# Patient Record
Sex: Male | Born: 1950 | Race: White | Hispanic: No | Smoking: Former smoker
Health system: Southern US, Community
[De-identification: ages and names within clinical notes are randomized; demographics above are authoritative.]

## PROBLEM LIST (undated history)

## (undated) DIAGNOSIS — C4491 Basal cell carcinoma of skin, unspecified: Secondary | ICD-10-CM

## (undated) DIAGNOSIS — I10 Essential (primary) hypertension: Secondary | ICD-10-CM

## (undated) DIAGNOSIS — R51 Headache: Secondary | ICD-10-CM

## (undated) DIAGNOSIS — R9439 Abnormal result of other cardiovascular function study: Secondary | ICD-10-CM

## (undated) DIAGNOSIS — J439 Emphysema, unspecified: Secondary | ICD-10-CM

## (undated) DIAGNOSIS — C4442 Squamous cell carcinoma of skin of scalp and neck: Secondary | ICD-10-CM

## (undated) DIAGNOSIS — L03119 Cellulitis of unspecified part of limb: Secondary | ICD-10-CM

## (undated) DIAGNOSIS — Z87891 Personal history of nicotine dependence: Secondary | ICD-10-CM

## (undated) DIAGNOSIS — E119 Type 2 diabetes mellitus without complications: Secondary | ICD-10-CM

## (undated) DIAGNOSIS — I2510 Atherosclerotic heart disease of native coronary artery without angina pectoris: Secondary | ICD-10-CM

## (undated) DIAGNOSIS — K4090 Unilateral inguinal hernia, without obstruction or gangrene, not specified as recurrent: Secondary | ICD-10-CM

## (undated) DIAGNOSIS — M1990 Unspecified osteoarthritis, unspecified site: Secondary | ICD-10-CM

## (undated) DIAGNOSIS — L02519 Cutaneous abscess of unspecified hand: Secondary | ICD-10-CM

## (undated) DIAGNOSIS — K219 Gastro-esophageal reflux disease without esophagitis: Secondary | ICD-10-CM

## (undated) DIAGNOSIS — H539 Unspecified visual disturbance: Secondary | ICD-10-CM

## (undated) DIAGNOSIS — E785 Hyperlipidemia, unspecified: Secondary | ICD-10-CM

## (undated) DIAGNOSIS — R519 Headache, unspecified: Secondary | ICD-10-CM

## (undated) DIAGNOSIS — I639 Cerebral infarction, unspecified: Secondary | ICD-10-CM

## (undated) DIAGNOSIS — M109 Gout, unspecified: Secondary | ICD-10-CM

## (undated) HISTORY — PX: CATARACT EXTRACTION: SUR2

## (undated) HISTORY — PX: TONSILLECTOMY: SUR1361

## (undated) HISTORY — DX: Cerebral infarction, unspecified: I63.9

## (undated) HISTORY — DX: Basal cell carcinoma of skin, unspecified: C44.91

## (undated) HISTORY — PX: MOHS SURGERY: SHX181

## (undated) HISTORY — PX: SKIN CANCER EXCISION: SHX779

## (undated) HISTORY — DX: Unspecified osteoarthritis, unspecified site: M19.90

## (undated) HISTORY — DX: Abnormal result of other cardiovascular function study: R94.39

## (undated) HISTORY — DX: Personal history of nicotine dependence: Z87.891

## (undated) HISTORY — DX: Unspecified visual disturbance: H53.9

---

## 2015-11-30 DIAGNOSIS — H7291 Unspecified perforation of tympanic membrane, right ear: Secondary | ICD-10-CM | POA: Diagnosis not present

## 2015-11-30 DIAGNOSIS — J320 Chronic maxillary sinusitis: Secondary | ICD-10-CM | POA: Diagnosis not present

## 2015-11-30 DIAGNOSIS — H9011 Conductive hearing loss, unilateral, right ear, with unrestricted hearing on the contralateral side: Secondary | ICD-10-CM | POA: Diagnosis not present

## 2015-12-13 DIAGNOSIS — D239 Other benign neoplasm of skin, unspecified: Secondary | ICD-10-CM | POA: Diagnosis not present

## 2015-12-13 DIAGNOSIS — L821 Other seborrheic keratosis: Secondary | ICD-10-CM | POA: Diagnosis not present

## 2016-01-02 DIAGNOSIS — I10 Essential (primary) hypertension: Secondary | ICD-10-CM | POA: Diagnosis not present

## 2016-01-02 DIAGNOSIS — N529 Male erectile dysfunction, unspecified: Secondary | ICD-10-CM | POA: Diagnosis not present

## 2016-01-02 DIAGNOSIS — Z23 Encounter for immunization: Secondary | ICD-10-CM | POA: Diagnosis not present

## 2016-01-02 DIAGNOSIS — R7303 Prediabetes: Secondary | ICD-10-CM | POA: Diagnosis not present

## 2016-01-30 DIAGNOSIS — M25531 Pain in right wrist: Secondary | ICD-10-CM | POA: Diagnosis not present

## 2016-01-30 DIAGNOSIS — M25532 Pain in left wrist: Secondary | ICD-10-CM | POA: Diagnosis not present

## 2016-02-02 DIAGNOSIS — D2339 Other benign neoplasm of skin of other parts of face: Secondary | ICD-10-CM | POA: Diagnosis not present

## 2016-02-02 DIAGNOSIS — M1A09X0 Idiopathic chronic gout, multiple sites, without tophus (tophi): Secondary | ICD-10-CM | POA: Diagnosis not present

## 2016-02-02 DIAGNOSIS — L820 Inflamed seborrheic keratosis: Secondary | ICD-10-CM | POA: Diagnosis not present

## 2016-02-02 DIAGNOSIS — D239 Other benign neoplasm of skin, unspecified: Secondary | ICD-10-CM | POA: Diagnosis not present

## 2016-02-02 DIAGNOSIS — L821 Other seborrheic keratosis: Secondary | ICD-10-CM | POA: Diagnosis not present

## 2016-03-29 ENCOUNTER — Inpatient Hospital Stay (HOSPITAL_COMMUNITY)
Admission: EM | Admit: 2016-03-29 | Discharge: 2016-03-30 | DRG: 066 | Disposition: A | Discharge status: Home / Self Care | Payer: PPO | Attending: Nephrology | Admitting: Nephrology

## 2016-03-29 ENCOUNTER — Emergency Department (HOSPITAL_COMMUNITY): Payer: PPO

## 2016-03-29 ENCOUNTER — Inpatient Hospital Stay (HOSPITAL_COMMUNITY): Payer: PPO

## 2016-03-29 ENCOUNTER — Encounter (HOSPITAL_COMMUNITY): Payer: Self-pay | Admitting: Emergency Medicine

## 2016-03-29 DIAGNOSIS — Z720 Tobacco use: Secondary | ICD-10-CM

## 2016-03-29 DIAGNOSIS — R29704 NIHSS score 4: Secondary | ICD-10-CM | POA: Diagnosis present

## 2016-03-29 DIAGNOSIS — I10 Essential (primary) hypertension: Secondary | ICD-10-CM | POA: Diagnosis present

## 2016-03-29 DIAGNOSIS — I639 Cerebral infarction, unspecified: Principal | ICD-10-CM | POA: Diagnosis present

## 2016-03-29 DIAGNOSIS — R471 Dysarthria and anarthria: Secondary | ICD-10-CM | POA: Diagnosis not present

## 2016-03-29 DIAGNOSIS — R7303 Prediabetes: Secondary | ICD-10-CM | POA: Diagnosis not present

## 2016-03-29 DIAGNOSIS — Z7982 Long term (current) use of aspirin: Secondary | ICD-10-CM | POA: Diagnosis not present

## 2016-03-29 DIAGNOSIS — R208 Other disturbances of skin sensation: Secondary | ICD-10-CM | POA: Diagnosis present

## 2016-03-29 DIAGNOSIS — Z79899 Other long term (current) drug therapy: Secondary | ICD-10-CM

## 2016-03-29 DIAGNOSIS — I6523 Occlusion and stenosis of bilateral carotid arteries: Secondary | ICD-10-CM | POA: Diagnosis not present

## 2016-03-29 DIAGNOSIS — R29810 Facial weakness: Secondary | ICD-10-CM | POA: Diagnosis not present

## 2016-03-29 DIAGNOSIS — R270 Ataxia, unspecified: Secondary | ICD-10-CM | POA: Diagnosis not present

## 2016-03-29 DIAGNOSIS — I6350 Cerebral infarction due to unspecified occlusion or stenosis of unspecified cerebral artery: Secondary | ICD-10-CM | POA: Diagnosis not present

## 2016-03-29 DIAGNOSIS — F17210 Nicotine dependence, cigarettes, uncomplicated: Secondary | ICD-10-CM | POA: Diagnosis present

## 2016-03-29 DIAGNOSIS — Z87891 Personal history of nicotine dependence: Secondary | ICD-10-CM | POA: Insufficient documentation

## 2016-03-29 HISTORY — DX: Cerebral infarction, unspecified: I63.9

## 2016-03-29 LAB — DIFFERENTIAL
Basophils Absolute: 0 10*3/uL (ref 0.0–0.1)
Basophils Relative: 0 %
Eosinophils Absolute: 0.2 10*3/uL (ref 0.0–0.7)
Eosinophils Relative: 2 %
Lymphocytes Relative: 20 %
Lymphs Abs: 1.8 10*3/uL (ref 0.7–4.0)
Monocytes Absolute: 0.5 10*3/uL (ref 0.1–1.0)
Monocytes Relative: 6 %
Neutro Abs: 6.6 10*3/uL (ref 1.7–7.7)
Neutrophils Relative %: 72 %

## 2016-03-29 LAB — I-STAT CHEM 8, ED
BUN: 20 mg/dL (ref 6–20)
Calcium, Ion: 1.12 mmol/L — ABNORMAL LOW (ref 1.15–1.40)
Chloride: 99 mmol/L — ABNORMAL LOW (ref 101–111)
Creatinine, Ser: 1.1 mg/dL (ref 0.61–1.24)
Glucose, Bld: 177 mg/dL — ABNORMAL HIGH (ref 65–99)
HCT: 49 % (ref 39.0–52.0)
Hemoglobin: 16.7 g/dL (ref 13.0–17.0)
Potassium: 3.9 mmol/L (ref 3.5–5.1)
Sodium: 137 mmol/L (ref 135–145)
TCO2: 27 mmol/L (ref 0–100)

## 2016-03-29 LAB — URINALYSIS, ROUTINE W REFLEX MICROSCOPIC
Bilirubin Urine: NEGATIVE
Glucose, UA: NEGATIVE mg/dL
Hgb urine dipstick: NEGATIVE
Ketones, ur: NEGATIVE mg/dL
Leukocytes, UA: NEGATIVE
Nitrite: NEGATIVE
Protein, ur: NEGATIVE mg/dL
Specific Gravity, Urine: 1.004 — ABNORMAL LOW (ref 1.005–1.030)
pH: 6 (ref 5.0–8.0)

## 2016-03-29 LAB — CBC
HCT: 47.4 % (ref 39.0–52.0)
Hemoglobin: 16.2 g/dL (ref 13.0–17.0)
MCH: 30.1 pg (ref 26.0–34.0)
MCHC: 34.2 g/dL (ref 30.0–36.0)
MCV: 88.1 fL (ref 78.0–100.0)
Platelets: 168 10*3/uL (ref 150–400)
RBC: 5.38 MIL/uL (ref 4.22–5.81)
RDW: 13.5 % (ref 11.5–15.5)
WBC: 9.1 10*3/uL (ref 4.0–10.5)

## 2016-03-29 LAB — RAPID URINE DRUG SCREEN, HOSP PERFORMED
Amphetamines: NOT DETECTED
Barbiturates: NOT DETECTED
Benzodiazepines: NOT DETECTED
Cocaine: NOT DETECTED
Opiates: NOT DETECTED
Tetrahydrocannabinol: NOT DETECTED

## 2016-03-29 LAB — COMPREHENSIVE METABOLIC PANEL
ALT: 23 U/L (ref 17–63)
AST: 20 U/L (ref 15–41)
Albumin: 4.3 g/dL (ref 3.5–5.0)
Alkaline Phosphatase: 83 U/L (ref 38–126)
Anion gap: 8 (ref 5–15)
BUN: 18 mg/dL (ref 6–20)
CO2: 27 mmol/L (ref 22–32)
Calcium: 9.3 mg/dL (ref 8.9–10.3)
Chloride: 100 mmol/L — ABNORMAL LOW (ref 101–111)
Creatinine, Ser: 1.13 mg/dL (ref 0.61–1.24)
GFR calc Af Amer: >60 mL/min (ref >60)
GFR calc non Af Amer: >60 mL/min (ref >60)
Glucose, Bld: 182 mg/dL — ABNORMAL HIGH (ref 65–99)
Potassium: 3.9 mmol/L (ref 3.5–5.1)
Sodium: 135 mmol/L (ref 135–145)
Total Bilirubin: 0.6 mg/dL (ref 0.3–1.2)
Total Protein: 7.5 g/dL (ref 6.5–8.1)

## 2016-03-29 LAB — I-STAT TROPONIN, ED: Troponin i, poc: 0 ng/mL (ref 0.00–0.08)

## 2016-03-29 LAB — CBG MONITORING, ED: Glucose-Capillary: 164 mg/dL — ABNORMAL HIGH (ref 65–99)

## 2016-03-29 LAB — APTT: aPTT: 29 seconds (ref 24–36)

## 2016-03-29 LAB — PROTIME-INR
INR: 0.98
Prothrombin Time: 13 seconds (ref 11.4–15.2)

## 2016-03-29 MED ORDER — ACETAMINOPHEN 650 MG RE SUPP: 650.0000 mg | RECTAL | Status: DC | PRN

## 2016-03-29 MED ORDER — ALLOPURINOL 100 MG PO TABS
100.0000 mg | ORAL_TABLET | Freq: Every day | ORAL | Status: DC
  Administered 2016-03-30: 100 mg via ORAL
  Filled 2016-03-29: qty 1

## 2016-03-29 MED ORDER — NICOTINE 21 MG/24HR TD PT24
21.0000 mg | MEDICATED_PATCH | Freq: Every day | TRANSDERMAL | Status: DC
  Administered 2016-03-30: 21 mg via TRANSDERMAL
  Filled 2016-03-29: qty 1

## 2016-03-29 MED ORDER — STROKE: EARLY STAGES OF RECOVERY BOOK
Freq: Once | Status: AC
  Administered 2016-03-30: 10:00:00
  Filled 2016-03-29 (×2): qty 1

## 2016-03-29 MED ORDER — ENOXAPARIN SODIUM 40 MG/0.4ML ~~LOC~~ SOLN
40.0000 mg | SUBCUTANEOUS | Status: DC
  Administered 2016-03-29: 40 mg via SUBCUTANEOUS
  Filled 2016-03-29: qty 0.4

## 2016-03-29 MED ORDER — ACETAMINOPHEN 325 MG PO TABS: 650.0000 mg | ORAL_TABLET | ORAL | Status: DC | PRN

## 2016-03-29 MED ORDER — NICOTINE 21 MG/24HR TD PT24
21.0000 mg | MEDICATED_PATCH | Freq: Once | TRANSDERMAL | Status: AC
  Administered 2016-03-29: 21 mg via TRANSDERMAL
  Filled 2016-03-29: qty 1

## 2016-03-29 MED ORDER — VALACYCLOVIR HCL 500 MG PO TABS
500.0000 mg | ORAL_TABLET | Freq: Once | ORAL | Status: AC
  Administered 2016-03-29: 500 mg via ORAL
  Filled 2016-03-29: qty 1

## 2016-03-29 MED ORDER — SENNOSIDES-DOCUSATE SODIUM 8.6-50 MG PO TABS: 1.0000 | ORAL_TABLET | Freq: Every evening | ORAL | Status: DC | PRN

## 2016-03-29 MED ORDER — ASPIRIN 325 MG PO TABS
325.0000 mg | ORAL_TABLET | Freq: Every day | ORAL | Status: DC
  Administered 2016-03-29 – 2016-03-30 (×2): 325 mg via ORAL
  Filled 2016-03-29 (×2): qty 1

## 2016-03-29 MED ORDER — NICOTINE 21 MG/24HR TD PT24
MEDICATED_PATCH | TRANSDERMAL | Status: AC
  Filled 2016-03-29: qty 1

## 2016-03-29 MED ORDER — SODIUM CHLORIDE 0.9 % IV SOLN
INTRAVENOUS | Status: DC
  Administered 2016-03-29 – 2016-03-30 (×2): via INTRAVENOUS

## 2016-03-29 MED ORDER — PREDNISONE 50 MG PO TABS
60.0000 mg | ORAL_TABLET | Freq: Once | ORAL | Status: AC
  Administered 2016-03-29: 60 mg via ORAL
  Filled 2016-03-29: qty 1

## 2016-03-29 MED ORDER — ACETAMINOPHEN 160 MG/5ML PO SOLN: 650.0000 mg | ORAL | Status: DC | PRN

## 2016-03-29 NOTE — ED Notes (Signed)
Patient ambulatory to restroom with steady gait, clean catch instructions given and advised pt to bring specimen back to room as well.  

## 2016-03-29 NOTE — Consult Note (Signed)
Drew A. Merlene Laughter, MD     [email or web address]          Drew Harrison is an 66 y.o. male.   ASSESSMENT/PLAN: 1. 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 Risk factors hypertension, age and nicotine use. The patient aspirin will be increased from 81 mg at 325 mg. Smoking cessation is discussed with the patient. Lipid panel is pending and so is echocardiography. I believe it is reasonable to start the patient on low dose statin even if his cholesterol profile is normal. The patient is a follow-up in the office with me in one month.    The patient is a 66 year old white male who presents with the acute onset of being somewhat unsteady and left facial weakness 3 AM yesterday. He woke up with the symptoms. He also reports that his speech has been slurred. He reports having some dysesthesia involving the left upper extremity but this lasted only momentarily and has since resolved. The patient denies headaches, persistent focal numbness or weakness. He denies diplopia or dysphagia. There is no chest pain, shortness of breath, GI GU symptoms. The patient tells me he has been on 81 mg aspirin for a few years. He also has a history of hypertension and has been compliant with antihypertensive medications. He has had these cholesterol checked about 3 months ago and this apparently was fine. He was not placed on vacation for this. The review systems otherwise negative.  GENERAL: This is a thin pleasant man in no acute distress.  HEENT: This is unremarkable. He is status post cataract extraction procedure.  ABDOMEN: soft  EXTREMITIES: No edema   BACK: Normal  SKIN: Normal by inspection.    MENTAL STATUS: Alert and oriented including orientation to age and month. Speech - mildly dysarthric, language and cognition are generally intact. Judgment and insight normal.   CRANIAL NERVES: Pupils are equal, round and reactive to light and accomodation; extra ocular  movements are full, there is no significant nystagmus; visual fields are full; the frontalis muscles are normal bilaterally, the left obicularis oculi is slightly weak but the orbicularis oris muscles and lower facial muscles are markedly weak. The right facial muscles are normal. The tongue is midline; uvula is midline; shoulder elevation is normal.  MOTOR: Normal tone, bulk and strength; no pronator drift. There is no drift of the legs or upper extremities.  COORDINATION: Left finger to nose is normal, right finger to nose is normal, No rest tremor; no intention tremor; no postural tremor; no bradykinesia.  REFLEXES: Deep tendon reflexes are symmetrical and normal. Babinski reflexes are flexor bilaterally.   SENSATION: Normal to light touch, temperature, and pinprick. The patient does not extinguish to double simultaneous visual or tactile stimulation.  GAIT: Normal.    NIH stroke scale 3.  Blood pressure (!) 164/89, pulse (!) 109, temperature 98.1 F (36.7 C), temperature source Oral, resp. rate 18, height 6' (1.829 m), weight 190 lb (86.2 kg), SpO2 97 %.  History reviewed. No pertinent past medical history.  History reviewed. No pertinent surgical history.  History reviewed. No pertinent family history.  Social History:  reports that he has been smoking Cigarettes.  He has quit using smokeless tobacco. He reports that he does not drink alcohol. His drug history is not on file.  Allergies: No Known Allergies  Medications: Prior to Admission medications   Medication Sig Start Date End Date Taking? Authorizing Provider  allopurinol (ZYLOPRIM) 100 MG tablet Take 1 tablet by mouth daily. 02/06/16  Yes Historical Provider, MD  aspirin 325 MG tablet Take 325 mg by mouth daily.   Yes Historical Provider, MD  lisinopril-hydrochlorothiazide (PRINZIDE,ZESTORETIC) 20-25 MG tablet Take 1 tablet by mouth daily. 01/12/16  Yes Historical Provider, MD  naproxen sodium (ANAPROX) 220 MG tablet  Take 220 mg by mouth daily as needed (pain).   Yes Historical Provider, MD    Scheduled Meds: .  stroke: mapping our early stages of recovery book   Does not apply Once  . [START ON 03/30/2016] allopurinol  100 mg Oral Daily  . aspirin  325 mg Oral Daily  . enoxaparin (LOVENOX) injection  40 mg Subcutaneous Q24H  . nicotine      . nicotine  21 mg Transdermal Once  . [START ON 03/30/2016] nicotine  21 mg Transdermal Daily   Continuous Infusions: . sodium chloride     PRN Meds:.acetaminophen **OR** acetaminophen (TYLENOL) oral liquid 160 mg/5 mL **OR** acetaminophen, senna-docusate     Results for orders placed or performed during the hospital encounter of 03/29/16 (from the past 48 hour(s))  Protime-INR     Status: None   Collection Time: 03/29/16  8:59 AM  Result Value Ref Range   Prothrombin Time 13.0 11.4 - 15.2 seconds   INR 0.98   APTT     Status: None   Collection Time: 03/29/16  8:59 AM  Result Value Ref Range   aPTT 29 24 - 36 seconds  CBC     Status: None   Collection Time: 03/29/16  8:59 AM  Result Value Ref Range   WBC 9.1 4.0 - 10.5 K/uL   RBC 5.38 4.22 - 5.81 MIL/uL   Hemoglobin 16.2 13.0 - 17.0 g/dL   HCT 47.4 39.0 - 52.0 %   MCV 88.1 78.0 - 100.0 fL   MCH 30.1 26.0 - 34.0 pg   MCHC 34.2 30.0 - 36.0 g/dL   RDW 13.5 11.5 - 15.5 %   Platelets 168 150 - 400 K/uL  Differential     Status: None   Collection Time: 03/29/16  8:59 AM  Result Value Ref Range   Neutrophils Relative % 72 %   Neutro Abs 6.6 1.7 - 7.7 K/uL   Lymphocytes Relative 20 %   Lymphs Abs 1.8 0.7 - 4.0 K/uL   Monocytes Relative 6 %   Monocytes Absolute 0.5 0.1 - 1.0 K/uL   Eosinophils Relative 2 %   Eosinophils Absolute 0.2 0.0 - 0.7 K/uL   Basophils Relative 0 %   Basophils Absolute 0.0 0.0 - 0.1 K/uL  Comprehensive metabolic panel     Status: Abnormal   Collection Time: 03/29/16  8:59 AM  Result Value Ref Range   Sodium 135 135 - 145 mmol/L   Potassium 3.9 3.5 - 5.1 mmol/L   Chloride  100 (L) 101 - 111 mmol/L   CO2 27 22 - 32 mmol/L   Glucose, Bld 182 (H) 65 - 99 mg/dL   BUN 18 6 - 20 mg/dL   Creatinine, Ser 1.13 0.61 - 1.24 mg/dL   Calcium 9.3 8.9 - 10.3 mg/dL   Total Protein 7.5 6.5 - 8.1 g/dL   Albumin 4.3 3.5 - 5.0 g/dL   AST 20 15 - 41 U/L   ALT 23 17 - 63 U/L   Alkaline Phosphatase 83 38 - 126 U/L   Total Bilirubin 0.6 0.3 - 1.2 mg/dL   GFR calc non Af Amer >60 >60 mL/min   GFR calc Af Amer >60 >60 mL/min  Comment: (NOTE) The eGFR has been calculated using the CKD EPI equation. This calculation has not been validated in all clinical situations. eGFR's persistently <60 mL/min signify possible Chronic Kidney Disease.    Anion gap 8 5 - 15  Urine rapid drug screen (hosp performed)not at [facility]     Status: None   Collection Time: 03/29/16  9:03 AM  Result Value Ref Range   Opiates NONE DETECTED NONE DETECTED   Cocaine NONE DETECTED NONE DETECTED   Benzodiazepines NONE DETECTED NONE DETECTED   Amphetamines NONE DETECTED NONE DETECTED   Tetrahydrocannabinol NONE DETECTED NONE DETECTED   Barbiturates NONE DETECTED NONE DETECTED    Comment:        DRUG SCREEN FOR MEDICAL PURPOSES ONLY.  IF CONFIRMATION IS NEEDED FOR ANY PURPOSE, NOTIFY LAB WITHIN 5 DAYS.        LOWEST DETECTABLE LIMITS FOR URINE DRUG SCREEN Drug Class       Cutoff (ng/mL) Amphetamine      1000 Barbiturate      200 Benzodiazepine   540 Tricyclics       981 Opiates          300 Cocaine          300 THC              50   Urinalysis, Routine w reflex microscopic     Status: Abnormal   Collection Time: 03/29/16  9:03 AM  Result Value Ref Range   Color, Urine STRAW (A) YELLOW   APPearance CLEAR CLEAR   Specific Gravity, Urine 1.004 (L) 1.005 - 1.030   pH 6.0 5.0 - 8.0   Glucose, UA NEGATIVE NEGATIVE mg/dL   Hgb urine dipstick NEGATIVE NEGATIVE   Bilirubin Urine NEGATIVE NEGATIVE   Ketones, ur NEGATIVE NEGATIVE mg/dL   Protein, ur NEGATIVE NEGATIVE mg/dL   Nitrite NEGATIVE  NEGATIVE   Leukocytes, UA NEGATIVE NEGATIVE  I-stat troponin, ED     Status: None   Collection Time: 03/29/16  9:10 AM  Result Value Ref Range   Troponin i, poc 0.00 0.00 - 0.08 ng/mL   Comment 3            Comment: Due to the release kinetics of cTnI, a negative result within the first hours of the onset of symptoms does not rule out myocardial infarction with certainty. If myocardial infarction is still suspected, repeat the test at appropriate intervals.   I-Stat Chem 8, ED     Status: Abnormal   Collection Time: 03/29/16  9:12 AM  Result Value Ref Range   Sodium 137 135 - 145 mmol/L   Potassium 3.9 3.5 - 5.1 mmol/L   Chloride 99 (L) 101 - 111 mmol/L   BUN 20 6 - 20 mg/dL   Creatinine, Ser 1.10 0.61 - 1.24 mg/dL   Glucose, Bld 177 (H) 65 - 99 mg/dL   Calcium, Ion 1.12 (L) 1.15 - 1.40 mmol/L   TCO2 27 0 - 100 mmol/L   Hemoglobin 16.7 13.0 - 17.0 g/dL   HCT 49.0 39.0 - 52.0 %  CBG monitoring, ED     Status: Abnormal   Collection Time: 03/29/16  9:14 AM  Result Value Ref Range   Glucose-Capillary 164 (H) 65 - 99 mg/dL    Studies/Results:   MRI BRAIN FINDINGS: Brain: There is a small 5-6 mm focus of restricted diffusion along the left lower cerebellopontine angle at the level of the pontomedullary junction (series 3, image 71 and series 5, image  50). No associated hemorrhage or mass effect.  No other restricted diffusion. No midline shift, mass effect, evidence of mass lesion, ventriculomegaly, extra-axial collection or acute intracranial hemorrhage. Cervicomedullary junction and pituitary are within normal limits.  Outside of the acute findings largely normal for age gray and white matter signal throughout the brain. There is evidence of a small subcortical white matter chronic microhemorrhage in the superior left occipital lobe on series 10, image 13.  Vascular: Major intracranial vascular flow voids are preserved with generalized intracranial artery  dolichoectasia. This includes distal left vertebral artery ectasia with mild mass effect on the left cerebellopontine angle (series 7, image 6).  Skull and upper cervical spine: Negative. Visualized bone marrow signal is within normal limits.  Sinuses/Orbits: Postoperative changes to both globes. Otherwise negative orbit soft tissues. Mild paranasal sinus mucosal thickening.  Other: Visible internal auditory structures appear normal. Trace right mastoid fluid, likely postinflammatory and significance doubtful. The left mastoids are clear. Negative nasopharynx. Normal stylomastoid foramina. Negative scalp soft tissues.  IMPRESSION: 1. Acute lacunar type infarct in the left lower brainstem at the pontomedullary junction (series 3, image 71), probably affecting the left facial nerve motor nucleus in this clinical setting. No associated hemorrhage or mass effect. 2. Generalized intracranial artery dolichoectasia. 3. Otherwise largely unremarkable for age noncontrast MRI appearance of the brain.      BRAIN MRA FINDINGS: ANTERIOR CIRCULATION:  Distal cervical segments of the internal carotid arteries are tortuous but widely patent with antegrade flow. Petrous, cavernous, and supraclinoid segments widely patent. A1 segments widely patent. Anterior communicating artery normal. Anterior cerebral arteries widely patent to their distal aspects. M1 segments patent without stenosis or occlusion. MCA bifurcations normal. Distal MCA branches well opacified and symmetric.  POSTERIOR CIRCULATION:  Vertebral arteries are largely code dominant and widely patent to the vertebrobasilar junction. Posterior inferior cerebral arteries patent proximally. Mild atheromatous irregularity within the basilar artery without flow-limiting stenosis. Superior cerebral arteries patent bilaterally. Both of the posterior cerebral artery supplied mainly via the basilar artery and are widely patent  to their distal aspects.  No aneurysm or vascular malformation.  IMPRESSION: 1. Mild atheromatous irregularity within the basilar artery without flow-limiting stenosis. 2. Otherwise normal intracranial MRA.         The brain MRI is reviewed in person and shows a tiny increased signal on diffusion imaging involving the pontomedullary junction on the left side. No evidence of other chronic infarcts small or cortical. No other lesions are seen on FLAIR. No hemorrhages are appreciated.         CAROTID DOPPLERS Right:  Heterogeneous and partially calcified plaque at the carotid bifurcation, with discordant results regarding degree of stenosis by established duplex criteria. Peak velocity suggests 50- 69% stenosis, with the ICA/ CCA ratio suggesting a lesser degree of stenosis. If establishing a more accurate degree of stenosis is required, cerebral angiogram should be considered, or as a second best test, CTA.  SYS VELOCITY 171   ICA/CCA 1.6  Left:  Color duplex indicates moderate heterogeneous and calcified plaque, with no hemodynamically significant stenosis by duplex criteria in the left extracranial cerebrovascular circulation.    Jadan Hinojos A. Merlene Harrison, M.D.  Diplomate, Tax adviser of Psychiatry and Neurology ( Neurology). 03/29/2016, 6:21 PM

## 2016-03-29 NOTE — ED Notes (Signed)
Taken to ultrasound by ultrasound staff

## 2016-03-29 NOTE — ED Notes (Signed)
Called CT pt has been marked ready for transport.

## 2016-03-29 NOTE — ED Notes (Signed)
Given peanut butter crackers and water.

## 2016-03-29 NOTE — H&P (Addendum)
History and Physical    Drew Harrison [id number] DOB: 27-Aug-1950 DOA: 03/29/2016  PCP: [place]  Patient coming from: Home  Chief Complaint: Left facial droop  HPI: Drew Harrison is a 66 y.o. male with medical history significant of hypertension, borderline diabetes as per patient, actively smoker presented with left facial droop is associated with slurred speech and thinking sensation on left side since last night. Patient reported that it started around 3 AM in the morning. He was concerned that striking the hospital for evaluation. He smokes about one half pack of cigarettes every day for a long time. He works as a Secondary school teacher. Reported dizziness this morning. Denied fever, chills, headache, nausea, vomiting, chest pain, shortness of breath, abdominal pain. No upper or lower extremity weakness.  ED Course: In the ER, MRI showed acute stroke. Neurology was consulted and patient is admitted for further evaluation. -Initially patient was treated with prednisone, Valcyte for possible Bell's palsy. Letter MRI showed acute stroke.  Review of Systems: As per HPI otherwise 10 point review of systems negative.    Past medical history: Hypertension, borderline diabetes  Past surgical history: No pertinent surgical history.  Social history: reports that he has been smoking Cigarettes.   He reports that he does not drink alcohol. His drug history is not on file.  No Known Allergies allergies: Reviewed with the patient. No known allergies  Family history: Reviewed. No significant family history.  Prior to Admission medications   Medication Sig Start Date End Date Taking? Authorizing Provider  allopurinol (ZYLOPRIM) 100 MG tablet Take 1 tablet by mouth daily. 02/06/16  Yes Historical Provider, MD  aspirin 325 MG tablet Take 325 mg by mouth daily.   Yes Historical Provider, MD  lisinopril-hydrochlorothiazide (PRINZIDE,ZESTORETIC) 20-25 MG tablet Take 1 tablet  by mouth daily. 01/12/16  Yes Historical Provider, MD  naproxen sodium (ANAPROX) 220 MG tablet Take 220 mg by mouth daily as needed (pain).   Yes Historical Provider, MD    Physical Exam: Vitals:   03/29/16 1015 03/29/16 1030 03/29/16 1100 03/29/16 1200  BP:  148/79 163/89 169/95  Pulse:  96 90 94  Resp:  11 17 17   Temp: 98.2 F (36.8 C)     TempSrc:      SpO2:  95% 98% 99%  Weight:      Height:          Constitutional: NAD, calm, comfortable Vitals:   03/29/16 1015 03/29/16 1030 03/29/16 1100 03/29/16 1200  BP:  148/79 163/89 169/95  Pulse:  96 90 94  Resp:  11 17 17   Temp: 98.2 F (36.8 C)     TempSrc:      SpO2:  95% 98% 99%  Weight:      Height:       Eyes: PERRL, lids and conjunctivae normal ENMT: Left-sided fascial droop, mild slurred speech. Neck: normal Respiratory: clear to auscultation bilaterally, no wheezing, no crackles. Normal respiratory effort. No accessory muscle use.  Cardiovascular: Regular rate and rhythm, no murmurs / rubs / gallops. No extremity edema. 2+ pedal pulses. No carotid bruits.  Abdomen: no tenderness, no masses palpated. No hepatosplenomegaly. Bowel sounds positive.  Musculoskeletal: no clubbing / cyanosis. No joint deformity upper and lower extremities. Good ROM, no contractures. Normal muscle tone.  Skin: no rashes, lesions, ulcers. No induration Neurologic: Alert, awake, oriented 3. Left-sided facial droop. Muscle strength 5 over 5 in all extremities, symmetric.  Psychiatric: Normal judgment and insight. Alert and oriented x 3.  Normal mood.    Labs on Admission: I have personally reviewed following labs and imaging studies  CBC:  Recent Labs Lab 03/29/16 0859 03/29/16 0912  WBC 9.1  --   NEUTROABS 6.6  --   HGB 16.2 16.7  HCT 47.4 49.0  MCV 88.1  --   PLT 168  --    Basic Metabolic Panel:  Recent Labs Lab 03/29/16 0859 03/29/16 0912  NA 135 137  K 3.9 3.9  CL 100* 99*  CO2 27  --   GLUCOSE 182* 177*  BUN 18 20   CREATININE 1.13 1.10  CALCIUM 9.3  --    GFR: Estimated Creatinine Clearance: 73.5 mL/min (by C-G formula based on SCr of 1.1 mg/dL). Liver Function Tests:  Recent Labs Lab 03/29/16 0859  AST 20  ALT 23  ALKPHOS 83  BILITOT 0.6  PROT 7.5  ALBUMIN 4.3   No results for input(s): LIPASE, AMYLASE in the last 168 hours. No results for input(s): AMMONIA in the last 168 hours. Coagulation Profile:  Recent Labs Lab 03/29/16 0859  INR 0.98   Cardiac Enzymes: No results for input(s): CKTOTAL, CKMB, CKMBINDEX, TROPONINI in the last 168 hours. BNP (last 3 results) No results for input(s): PROBNP in the last 8760 hours. HbA1C: No results for input(s): HGBA1C in the last 72 hours. CBG:  Recent Labs Lab 03/29/16 0914  GLUCAP 164*   Lipid Profile: No results for input(s): CHOL, HDL, LDLCALC, TRIG, CHOLHDL, LDLDIRECT in the last 72 hours. Thyroid Function Tests: No results for input(s): TSH, T4TOTAL, FREET4, T3FREE, THYROIDAB in the last 72 hours. Anemia Panel: No results for input(s): VITAMINB12, FOLATE, FERRITIN, TIBC, IRON, RETICCTPCT in the last 72 hours. Urine analysis:    Component Value Date/Time   COLORURINE STRAW (A) 03/29/2016 0903   APPEARANCEUR CLEAR 03/29/2016 0903   LABSPEC 1.004 (L) 03/29/2016 0903   PHURINE 6.0 03/29/2016 0903   GLUCOSEU NEGATIVE 03/29/2016 0903   HGBUR NEGATIVE 03/29/2016 0903   BILIRUBINUR NEGATIVE 03/29/2016 0903   KETONESUR NEGATIVE 03/29/2016 0903   PROTEINUR NEGATIVE 03/29/2016 0903   NITRITE NEGATIVE 03/29/2016 0903   LEUKOCYTESUR NEGATIVE 03/29/2016 0903   Sepsis Labs: !!!!!!!!!!!!!!!!!!!!!!!!!!!!!!!!!!!!!!!!!!!! @LABRCNTIP (procalcitonin:4,lacticidven:4) )No results found for this or any previous visit (from the past 240 hour(s)).   Radiological Exams on Admission: Ct Head Wo Contrast  Result Date: 03/29/2016 CLINICAL DATA:  Left-sided facial droop starting at 4 a.m. today. EXAM: CT HEAD WITHOUT CONTRAST TECHNIQUE: Contiguous  axial images were obtained from the base of the skull through the vertex without intravenous contrast. COMPARISON:  None. FINDINGS: Brain: No evidence of acute infarction, hemorrhage, hydrocephalus, extra-axial collection or mass lesion/mass effect. Vascular: Diffuse atherosclerotic calcification. Skull: No acute or aggressive finding. 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 Sinuses/Orbits: Bilateral cataract resection. IMPRESSION: No acute finding. Electronically Signed   By: Monte Fantasia M.D.   On: 03/29/2016 09:59   Mr Brain Wo Contrast  Result Date: 03/29/2016 CLINICAL DATA:  66 year old male with left facial droop since last night. Slurred speech and left side tingling. Recent cold symptoms. Initial encounter. EXAM: MRI HEAD WITHOUT CONTRAST TECHNIQUE: Multiplanar, multiecho pulse sequences of the brain and surrounding structures were obtained without intravenous contrast. COMPARISON:  Head CT without contrast 0947 hours today. FINDINGS: Brain: There is a small 5-6 mm focus of restricted diffusion along the left lower cerebellopontine angle at the level of the pontomedullary junction (series 3, image 71 and series 5, image 50). No associated hemorrhage or mass effect. No other  restricted diffusion. No midline shift, mass effect, evidence of mass lesion, ventriculomegaly, extra-axial collection or acute intracranial hemorrhage. Cervicomedullary junction and pituitary are within normal limits. Outside of the acute findings largely normal for age gray and white matter signal throughout the brain. There is evidence of a small subcortical white matter chronic microhemorrhage in the superior left occipital lobe on series 10, image 13. Vascular: Major intracranial vascular flow voids are preserved with generalized intracranial artery dolichoectasia. This includes distal left vertebral artery ectasia with mild mass effect on the left cerebellopontine angle (series 7, image 6). Skull and upper cervical  spine: Negative. Visualized bone marrow signal is within normal limits. Sinuses/Orbits: Postoperative changes to both globes. Otherwise negative orbit soft tissues. Mild paranasal sinus mucosal thickening. Other: Visible internal auditory structures appear normal. Trace right mastoid fluid, likely postinflammatory and significance doubtful. The left mastoids are clear. Negative nasopharynx. Normal stylomastoid foramina. Negative scalp soft tissues. IMPRESSION: 1. Acute lacunar type infarct in the left lower brainstem at the pontomedullary junction (series 3, image 71), probably affecting the left facial nerve motor nucleus in this clinical setting. No associated hemorrhage or mass effect. 2. Generalized intracranial artery dolichoectasia. 3. Otherwise largely unremarkable for age noncontrast MRI appearance of the brain. Electronically Signed   By: Genevie Ann M.D.   On: 03/29/2016 14:27    EKG: Independently reviewed. Sinus tachycardia.  Assessment/Plan Active Problems:   CVA (cerebral vascular accident) ([place])   Ischemic stroke ([place])  #Acute lacunar infarct in the left lower brainstem at pontomedullary junction: Probably affecting left facial nerve motor nucleus contributing left-sided facial droop, thinking sensation and slurred speech. -Patient takes aspirin at home. We'll continue. Check lipid panel, may need a statin treatment depending on the lipid test result. -Order MRA, carotid Doppler ultrasound, echocardiogram -Hold antihypertensive. Allow permissive hypertension. -Neurology was contacted by ER. Follow up there to completion. -PT, OT evaluation and treatment -Nothing by mouth until a speech and swallow evaluated.  -Neuro check, frequent vitals.   #Hypertension: Hold antihypertensive medication. Allow permissive hypertension. Monitor blood pressure. Admitted in telemetry.  #Tobacco dependence: Education provided to the patient. Nicotine patch ordered.  DVT prophylaxis: Lovenox  subcutaneous Code Status: Full code Family Communication: No family present at bedside Disposition Plan: Likely discharge home in 1-2 days Consults called: Consulted neurologist by ER Admission status: Inpatient because of acute neurological findings and stroke.   Dron Tanna Furry MD [facility] Pager [phone]  If 7PM-7AM, please contact night-coverage [email or web address] Password [email or web address]  03/29/2016, 4:02 PM

## 2016-03-29 NOTE — ED Notes (Signed)
EDP and Patient aware MR will be down until 2pm

## 2016-03-29 NOTE — ED Notes (Signed)
Ambulated to restroom independently  

## 2016-03-29 NOTE — ED Notes (Signed)
Given lunch tray.

## 2016-03-29 NOTE — ED Provider Notes (Signed)
[facility] DEPT Provider Note   CSN: [id number] Arrival date & time: 03/29/16  0846   By signing my name below, I, Hilbert Odor, attest that this documentation has been prepared under the direction and in the presence of Merrily Pew, MD. Electronically Signed: Hilbert Odor, Scribe. 03/29/16. 8:57 AM. History   Chief Complaint Chief Complaint  Patient presents with  . Facial Droop    The history is provided by the patient. No language interpreter was used.   HPI Comments: Drew Harrison is a 66 y.o. male who presents to the Emergency Department complaining of left sided facial droop since last night. Patient also reports associated slurred speech and left sided tingling. Patient states that he has had a cold over the past 2 days. He reports a cough and rhinorrhea. He also reports some chest pain. Patient denies hx of stroke, diabetes, HLD. He is a current smoker.      No past medical history on file.  There are no active problems to display for this patient.   No past surgical history on file.     Home Medications    Prior to Admission medications   Not on File    Family History No family history on file.  Social History Social History  Substance Use Topics  . Smoking status: Not on file  . Smokeless tobacco: Not on file  . Alcohol use Not on file     Allergies   Patient has no allergy information on record.   Review of Systems Review of Systems  HENT: Positive for rhinorrhea.   Respiratory: Positive for cough.   Cardiovascular: Positive for chest pain.  Gastrointestinal: Negative for nausea and vomiting.  Neurological: Positive for facial asymmetry and speech difficulty.  All other systems reviewed and are negative.    Physical Exam Updated Vital Signs There were no vitals taken for this visit.  Physical Exam  Constitutional: He is oriented to person, place, and time. No distress.  HENT:  Head: Normocephalic and atraumatic.  Eyes:  EOM are normal. Pupils are equal, round, and reactive to light.  Neck: Normal range of motion. Neck supple.  Cardiovascular: Regular rhythm.  Exam reveals no gallop and no friction rub.   No murmur heard. Tachycardic.  Pulmonary/Chest: Breath sounds normal. No respiratory distress. He has no wheezes. He has no rales.  Lungs are clear.  Abdominal: Soft. He exhibits no distension. There is no tenderness.  Musculoskeletal: Normal range of motion.  Neurological: He is alert and oriented to person, place, and time. No cranial nerve deficit. Coordination normal.  Reflexes and strengths are normal, bilateral in lower extremities. Normal patella reflexes. Left facial droop that partially involves forehead. Mild decreased strength of eye closure on left.  Skin: Skin is warm and dry.  Psychiatric: He has a normal mood and affect.     ED Treatments / Results  DIAGNOSTIC STUDIES: Oxygen Saturation is 99% on RA, normal by my interpretation.    COORDINATION OF CARE: 8:57 AM Discussed treatment plan with pt at bedside and pt agreed to plan.  Labs (all labs ordered are listed, but only abnormal results are displayed) Labs Reviewed - No data to display  EKG  EKG Interpretation None       Radiology No results found.  Procedures Procedures (including critical care time)  Medications Ordered in ED Medications - No data to display   Initial Impression / Assessment and Plan / ED Course  I have reviewed the triage vital signs and the  nursing notes.  Pertinent labs & imaging results that were available during my care of the patient were reviewed by me and considered in my medical decision making (see chart for details).  Clinical Course    Bells palsy v cva. Whole forehead not spared so unclear. Plan for ct/possibly MRI and further tx based on results.  CT ok. Unclear on objective findings, will await MRI.  Delay in MRI 2/2 maintenance. Patient not in window for stroke interventions  with a relatively low NIHSS (4) if present so will wait for MRI as I think Bells Palsy is more likely so will start treatment for that.  MRI positive for lacunar infarct that anatomically fits patients symptoms. D/W neurology and medicine and will admit here for stroke workup and monitoring.    Final Clinical Impressions(s) / ED Diagnoses   Final diagnoses:  Cerebrovascular accident (CVA), unspecified mechanism ([place])    New Prescriptions New Prescriptions   No medications on file   I personally performed the services described in this documentation, which was scribed in my presence. The recorded information has been reviewed and is accurate.   Merrily Pew, MD 03/29/16 1538

## 2016-03-29 NOTE — ED Triage Notes (Signed)
Patient states he noticed a left facial droop and tingling to left side of body. States left side of body sensation is less than normal. Denies weakness.

## 2016-03-29 NOTE — ED Notes (Signed)
EDP at the bedside.  ?

## 2016-03-30 ENCOUNTER — Inpatient Hospital Stay (HOSPITAL_COMMUNITY): Payer: PPO

## 2016-03-30 DIAGNOSIS — I6350 Cerebral infarction due to unspecified occlusion or stenosis of unspecified cerebral artery: Secondary | ICD-10-CM

## 2016-03-30 LAB — GLUCOSE, CAPILLARY
Glucose-Capillary: 124 mg/dL — ABNORMAL HIGH (ref 65–99)
Glucose-Capillary: 254 mg/dL — ABNORMAL HIGH (ref 65–99)

## 2016-03-30 LAB — ECHOCARDIOGRAM COMPLETE
E decel time: 201 msec
E/e' ratio: 14.86
FS: 30 % (ref 28–44)
Height: 72 in
IVS/LV PW RATIO, ED: 1.06
LA ID, A-P, ES: 36 mm
LA diam end sys: 36 mm
LA diam index: 1.71 cm/m2
LA vol A4C: 69.3 ml
LA vol index: 32.9 mL/m2
LA vol: 69.2 mL
LV E/e' medial: 14.86
LV E/e'average: 14.86
LV PW d: 11.7 mm — AB (ref 0.6–1.1)
LV dias vol index: 66 mL/m2
LV dias vol: 139 mL (ref 62–150)
LV e' LATERAL: 7.94 cm/s
LV sys vol index: 29 mL/m2
LV sys vol: 61 mL (ref 21–61)
LVOT SV: 63 mL
LVOT VTI: 22.2 cm
LVOT area: 2.84 cm2
LVOT diameter: 19 mm
LVOT peak grad rest: 4 mmHg
LVOT peak vel: 100 cm/s
Lateral S' vel: 13.6 cm/s
MV Dec: 201
MV Peak grad: 6 mmHg
MV pk A vel: 95.6 m/s
MV pk E vel: 118 m/s
Simpson's disk: 56
Stroke v: 78 ml
TAPSE: 23.8 mm
TDI e' lateral: 7.94
TDI e' medial: 7.94
VTI: 201 cm
Weight: 3040 oz

## 2016-03-30 LAB — HEMOGLOBIN A1C
Hgb A1c MFr Bld: 6.9 % — ABNORMAL HIGH (ref 4.8–5.6)
Mean Plasma Glucose: 151 mg/dL

## 2016-03-30 LAB — LIPID PANEL
Cholesterol: 222 mg/dL — ABNORMAL HIGH (ref 0–200)
HDL: 47 mg/dL (ref >40)
LDL Cholesterol: 138 mg/dL — ABNORMAL HIGH (ref 0–99)
Total CHOL/HDL Ratio: 4.7 RATIO
Triglycerides: 187 mg/dL — ABNORMAL HIGH (ref <150)
VLDL: 37 mg/dL (ref 0–40)

## 2016-03-30 MED ORDER — METFORMIN HCL 500 MG PO TABS: 500.0000 mg | ORAL_TABLET | Freq: Two times a day (BID) | ORAL | 0 refills | Status: DC

## 2016-03-30 MED ORDER — INSULIN ASPART 100 UNIT/ML ~~LOC~~ SOLN: 0.0000 [IU] | Freq: Every day | SUBCUTANEOUS | Status: DC

## 2016-03-30 MED ORDER — ATORVASTATIN CALCIUM 40 MG PO TABS: 40.0000 mg | ORAL_TABLET | Freq: Every day | ORAL | 0 refills | Status: DC

## 2016-03-30 MED ORDER — INSULIN ASPART 100 UNIT/ML ~~LOC~~ SOLN
0.0000 [IU] | Freq: Three times a day (TID) | SUBCUTANEOUS | Status: DC
  Administered 2016-03-30: 3 [IU] via SUBCUTANEOUS

## 2016-03-30 MED ORDER — ATORVASTATIN CALCIUM 40 MG PO TABS: 40.0000 mg | ORAL_TABLET | Freq: Every day | ORAL | Status: DC

## 2016-03-30 NOTE — Progress Notes (Signed)
Reviewed discharge instructions with client, reviewed stroke education book, educated on s/s of stroke, stroke prevention, and risk factors, reviewed medications, including medication changes, medication purpose, actions, and side effects, reviewed when next dose due. Client voiced understanding to all instructions given. Client discharged to home in stable condition.

## 2016-03-30 NOTE — Progress Notes (Signed)
*  PRELIMINARY RESULTS* Echocardiogram 2D Echocardiogram has been performed.  Drew Harrison 03/30/2016, 10:15 AM

## 2016-03-30 NOTE — Discharge Summary (Signed)
Physician Discharge Summary  Drew Harrison [id number] DOB: 05/14/1950 DOA: 03/29/2016  PCP: [facility] date: 03/29/2016 Discharge date: 03/30/2016  Admitted From:Home Disposition: Home   Recommendations for Outpatient Follow-up:  1. Follow up with PCP in 1-2 weeks 2. Please obtain BMP/CBC in one week 3. Please discuss with her PCP for cardiology referral.   Home Health: No Equipment/Devices: No Discharge Condition: Stable CODE STATUS: Full code Diet recommendation: Carb modified modified heart healthy diet  Brief/Interim Summary:66 y.o. male with medical history significant of hypertension, borderline diabetes as per patient, actively smoker presented with left facial droop is associated with slurred speech and tingling sensation on left side. He smokes about one half pack of cigarettes every day for a long time.  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 -Patient takes aspirin at home. We'll continue. -LDL elevated therefore started on a statin. -MRA, carotid Doppler ultrasound revealed. -Echocardiogram showed grade 2 diastolic dysfunction andhypokinesis of the inferior myocardium. I discussed the above result with the patient in detail. I recommended to patient to follow-up with PCP and cardiologist outpatient. He may need further evaluation including stress test or further cardiac intervention depending on consultants evaluation. He verbalized understanding. Patient does not have chest pain or any cardiac symptoms. Troponin negative. Echo finding probably old. -Patient was evaluated by a neurologist. We will do medical management. Patient clinically improved. Able to ambulate in the room without any difficulties. He will follow-up with neurologist as an outpatient.  #Hypertension:  Continue home medications. Monitor blood pressure. Recommended outpatient follow-up.  #Tobacco dependence: Education provided to the patient.   Today, patient reported feeling much better. He still has left facial droop. He denied fever, chills, headache, dizziness, chest pain, shortness of breath, nausea or vomiting. He is ambulating in the room without any difficulties. He was already seen by neurologist recommended outpatient follow-up.  Patient was found to have A1c of 6.9. Further questioning patient reported that he was diagnosed with borderline diabetes but was not prescribed any medications. After discussion with the patient he agreed with oral pills on discharge. Started metformin twice a day with outpatient follow-up. Education provided to the patient regarding dietary change and importance of follow-up with PCP. He verbalized understanding.  Discharge Diagnoses:  Active Problems:   CVA (cerebral vascular accident) ([place])   Ischemic stroke [facility])    Discharge Instructions  Discharge Instructions    Call MD for:  difficulty breathing, headache or visual disturbances    Complete by:  As directed    Call MD for:  extreme fatigue    Complete by:  As directed    Call MD for:  hives    Complete by:  As directed    Call MD for:  persistant dizziness or light-headedness    Complete by:  As directed    Call MD for:  persistant nausea and vomiting    Complete by:  As directed    Call MD for:  severe uncontrolled pain    Complete by:  As directed    Call MD for:  temperature >100.4    Complete by:  As directed    Diet - low sodium heart healthy    Complete by:  As directed    Diet Carb Modified    Complete by:  As directed    Discharge instructions    Complete by:  As directed    Please discuss with your PCP for cardiology referral for abnormal echocardiogram result. You may need cardiac stress test.   Increase activity slowly    Complete by:  As directed      Allergies as of  03/30/2016   No Known Allergies     Medication List    TAKE these medications   allopurinol 100 MG tablet Commonly known as:  ZYLOPRIM Take 1 tablet by mouth daily.   aspirin 325 MG tablet Take 325 mg by mouth daily.   atorvastatin 40 MG tablet Commonly known as:  LIPITOR Take 1 tablet (40 mg total) by mouth daily at 6 PM.   lisinopril-hydrochlorothiazide 20-25 MG tablet Commonly known as:  PRINZIDE,ZESTORETIC Take 1 tablet by mouth daily.   metFORMIN 500 MG tablet Commonly known as:  GLUCOPHAGE Take 1 tablet (500 mg total) by mouth 2 (two) times daily with a meal.   naproxen sodium 220 MG tablet Commonly known as:  ANAPROX Take 220 mg by mouth daily as needed (pain).      Follow-up Information    [place]. Schedule an appointment as soon as possible for a visit in 1 week(s).   Specialty:  Family Medicine Contact information: [address] [place] [address] [phone]        Phillips Odor, MD. Schedule an appointment as soon as possible for a visit in 2 week(s).   Specialty:  Neurology Contact information: [address] [place] [address] [phone]          No Known Allergies  Consultations: Neurologist  Procedures/Studies: Echocardiogram, MRI, MRA, carotid Doppler.  Subjective: Patient was seen and examined at bedside. Reported feeling better. Denied fever, chills, headache, dizziness, chest pain, shortness of breath, nausea or vomiting.   Discharge Exam: Vitals:   03/30/16 0600 03/30/16 0840  BP: (!) 168/89 (!) 158/83  Pulse: 84 87  Resp: 15 18  Temp: 98.1 F (36.7 C) 98.3 F (36.8 C)   Vitals:   03/30/16 0200 03/30/16 0400 03/30/16 0600 03/30/16 0840  BP: (!) 165/88 (!) 167/88 (!) 168/89 (!) 158/83  Pulse: 94 84 84 87  Resp: 15 14 15 18   Temp: 97.5 F (36.4 C) 97.3 F (36.3 C) 98.1 F (36.7 C) 98.3 F (36.8 C)  TempSrc: Oral Oral Oral Oral  SpO2: 97% 99% 99% 100%   Weight:      Height:        General: Pt is alert, awake, not in acute distress Cardiovascular: RRR, S1/S2 +, no rubs, no gallops Respiratory: CTA bilaterally, no wheezing, no rhonchi Abdominal: Soft, NT, ND, bowel sounds + Extremities: no edema, no cyanosis Neurology: Alert, awake, ambulating in the hall. Oriented 3. Muscle strength 5 over 5 in all extremities. Left facial droop.   The results of significant diagnostics from this hospitalization (including imaging, microbiology, ancillary and laboratory) are listed below for reference.     Microbiology: No results found for this or any previous visit (from the past 240 hour(s)).   Labs: BNP (last 3 results) No results for input(s): BNP in the last 8760 hours. Basic Metabolic Panel:  Recent Labs Lab 03/29/16 0859 03/29/16 0912  NA 135 137  K 3.9 3.9  CL 100* 99*  CO2 27  --   GLUCOSE 182* 177*  BUN 18 20  CREATININE 1.13 1.10  CALCIUM 9.3  --    Liver Function Tests:  Recent Labs Lab 03/29/16 0859  AST 20  ALT 23  ALKPHOS 83  BILITOT 0.6  PROT 7.5  ALBUMIN 4.3   No results for input(s): LIPASE, AMYLASE in the last 168 hours. No results for input(s): AMMONIA in the last 168 hours. CBC:  Recent Labs Lab 03/29/16 0859 03/29/16 0912  WBC 9.1  --   NEUTROABS 6.6  --   HGB 16.2 16.7  HCT 47.4 49.0  MCV 88.1  --   PLT 168  --    Cardiac Enzymes: No results for input(s): CKTOTAL, CKMB, CKMBINDEX, TROPONINI in the last 168 hours. BNP: Invalid input(s): POCBNP CBG:  Recent Labs Lab 03/29/16 0914 03/30/16 0830 03/30/16 1125  GLUCAP 164* 124* 254*   D-Dimer No results for input(s): DDIMER in the last 72 hours. Hgb A1c  Recent Labs  03/29/16 0859  HGBA1C 6.9*   Lipid Profile  Recent Labs  03/30/16 0630  CHOL 222*  HDL 47  LDLCALC 138*  TRIG 187*  CHOLHDL 4.7   Thyroid function studies No results for input(s): TSH, T4TOTAL, T3FREE, THYROIDAB in the last 72 hours.  Invalid  input(s): FREET3 Anemia work up No results for input(s): VITAMINB12, FOLATE, FERRITIN, TIBC, IRON, RETICCTPCT in the last 72 hours. Urinalysis    Component Value Date/Time   COLORURINE STRAW (A) 03/29/2016 0903   APPEARANCEUR CLEAR 03/29/2016 0903   LABSPEC 1.004 (L) 03/29/2016 0903   PHURINE 6.0 03/29/2016 0903   GLUCOSEU NEGATIVE 03/29/2016 0903   HGBUR NEGATIVE 03/29/2016 0903   BILIRUBINUR NEGATIVE 03/29/2016 0903   KETONESUR NEGATIVE 03/29/2016 0903   PROTEINUR NEGATIVE 03/29/2016 0903   NITRITE NEGATIVE 03/29/2016 0903   LEUKOCYTESUR NEGATIVE 03/29/2016 0903   Sepsis Labs Invalid input(s): PROCALCITONIN,  WBC,  LACTICIDVEN Microbiology No results found for this or any previous visit (from the past 240 hour(s)).   Time coordinating discharge: 27 minutes  SIGNED:   Rosita Fire, MD  [facility] 03/30/2016, 2:24 PM  If 7PM-7AM, please contact night-coverage [email or web address] Password [email or web address]

## 2016-03-30 NOTE — Progress Notes (Signed)
PT Cancellation Note  Patient Details Name: Drew Harrison MRN: [id number] DOB: [birth year]   Cancelled Treatment:      Pt walking around in room on own.  Pt state he is at prior functioning level and is waiting for discharge.  Rayetta Humphrey, PT CLT [phone] 03/30/2016, 12:52 PM

## 2016-04-02 DIAGNOSIS — E119 Type 2 diabetes mellitus without complications: Secondary | ICD-10-CM | POA: Diagnosis not present

## 2016-04-02 DIAGNOSIS — I5189 Other ill-defined heart diseases: Secondary | ICD-10-CM | POA: Diagnosis not present

## 2016-04-02 DIAGNOSIS — E782 Mixed hyperlipidemia: Secondary | ICD-10-CM | POA: Diagnosis not present

## 2016-04-02 DIAGNOSIS — I519 Heart disease, unspecified: Secondary | ICD-10-CM | POA: Diagnosis not present

## 2016-04-02 DIAGNOSIS — I499 Cardiac arrhythmia, unspecified: Secondary | ICD-10-CM | POA: Diagnosis not present

## 2016-04-02 DIAGNOSIS — I10 Essential (primary) hypertension: Secondary | ICD-10-CM | POA: Diagnosis not present

## 2016-04-02 DIAGNOSIS — I639 Cerebral infarction, unspecified: Secondary | ICD-10-CM | POA: Diagnosis not present

## 2016-04-03 DIAGNOSIS — I2119 ST elevation (STEMI) myocardial infarction involving other coronary artery of inferior wall: Secondary | ICD-10-CM | POA: Diagnosis not present

## 2016-04-04 DIAGNOSIS — I6521 Occlusion and stenosis of right carotid artery: Secondary | ICD-10-CM | POA: Diagnosis not present

## 2016-04-04 DIAGNOSIS — I6990 Unspecified sequelae of unspecified cerebrovascular disease: Secondary | ICD-10-CM | POA: Diagnosis not present

## 2016-04-04 DIAGNOSIS — I10 Essential (primary) hypertension: Secondary | ICD-10-CM | POA: Diagnosis not present

## 2016-04-04 DIAGNOSIS — E782 Mixed hyperlipidemia: Secondary | ICD-10-CM | POA: Diagnosis not present

## 2016-04-08 DIAGNOSIS — I2510 Atherosclerotic heart disease of native coronary artery without angina pectoris: Secondary | ICD-10-CM | POA: Diagnosis not present

## 2016-04-08 DIAGNOSIS — R0602 Shortness of breath: Secondary | ICD-10-CM | POA: Diagnosis not present

## 2016-04-08 DIAGNOSIS — R9439 Abnormal result of other cardiovascular function study: Secondary | ICD-10-CM | POA: Diagnosis present

## 2016-04-08 DIAGNOSIS — R9431 Abnormal electrocardiogram [ECG] [EKG]: Secondary | ICD-10-CM | POA: Diagnosis not present

## 2016-04-09 DIAGNOSIS — I6521 Occlusion and stenosis of right carotid artery: Secondary | ICD-10-CM | POA: Diagnosis not present

## 2016-04-12 ENCOUNTER — Emergency Department (HOSPITAL_COMMUNITY): Payer: PPO

## 2016-04-12 ENCOUNTER — Encounter (HOSPITAL_COMMUNITY): Payer: Self-pay | Admitting: Emergency Medicine

## 2016-04-12 ENCOUNTER — Emergency Department (HOSPITAL_COMMUNITY)
Admission: EM | Admit: 2016-04-12 | Discharge: 2016-04-12 | Disposition: A | Discharge status: Home / Self Care | Payer: PPO | Attending: Emergency Medicine | Admitting: Emergency Medicine

## 2016-04-12 DIAGNOSIS — E119 Type 2 diabetes mellitus without complications: Secondary | ICD-10-CM | POA: Diagnosis not present

## 2016-04-12 DIAGNOSIS — F17210 Nicotine dependence, cigarettes, uncomplicated: Secondary | ICD-10-CM | POA: Insufficient documentation

## 2016-04-12 DIAGNOSIS — Z79899 Other long term (current) drug therapy: Secondary | ICD-10-CM | POA: Diagnosis not present

## 2016-04-12 DIAGNOSIS — R930 Abnormal findings on diagnostic imaging of skull and head, not elsewhere classified: Secondary | ICD-10-CM | POA: Diagnosis not present

## 2016-04-12 DIAGNOSIS — Z7982 Long term (current) use of aspirin: Secondary | ICD-10-CM | POA: Diagnosis not present

## 2016-04-12 DIAGNOSIS — G510 Bell's palsy: Secondary | ICD-10-CM | POA: Diagnosis not present

## 2016-04-12 DIAGNOSIS — Z7984 Long term (current) use of oral hypoglycemic drugs: Secondary | ICD-10-CM | POA: Insufficient documentation

## 2016-04-12 DIAGNOSIS — H578 Other specified disorders of eye and adnexa: Secondary | ICD-10-CM | POA: Diagnosis not present

## 2016-04-12 DIAGNOSIS — I10 Essential (primary) hypertension: Secondary | ICD-10-CM | POA: Diagnosis not present

## 2016-04-12 HISTORY — DX: Type 2 diabetes mellitus without complications: E11.9

## 2016-04-12 HISTORY — DX: Essential (primary) hypertension: I10

## 2016-04-12 HISTORY — DX: Gout, unspecified: M10.9

## 2016-04-12 HISTORY — DX: Hyperlipidemia, unspecified: E78.5

## 2016-04-12 HISTORY — DX: Cerebral infarction, unspecified: I63.9

## 2016-04-12 MED ORDER — KETOTIFEN FUMARATE 0.025 % OP SOLN
2.0000 [drp] | Freq: Two times a day (BID) | OPHTHALMIC | Status: DC
  Administered 2016-04-12: 2 [drp] via OPHTHALMIC
  Filled 2016-04-12: qty 5

## 2016-04-12 MED ORDER — CARBOXYMETHYLCELLUL-GLYCERIN 0.5-0.9 % OP SOLN: 1.0000 [drp] | Freq: Three times a day (TID) | OPHTHALMIC | 0 refills | Status: DC | PRN

## 2016-04-12 NOTE — ED Provider Notes (Signed)
[facility] DEPT Provider Note   CSN: [id number] Arrival date & time: 04/12/16  1330     History   Chief Complaint Chief Complaint  Patient presents with  . Eye Problem    HPI Drew Harrison is a 66 y.o. male.  Pt presents to the ED today with left eyelid inability to close.  The pt was admitted on 1/5 with a CVA affecting his left facial nerve.  At the time, he just had eyelid drooping.  For the past few days, it would not close.  He has an appt with Dr. Merlene Laughter on 1/29.  He has followed up with cardiology and pcp since his d/c.      Past Medical History:  Diagnosis Date  . Diabetes mellitus without complication ([place])   . Gout   . Hyperlipidemia   . Hypertension   . Stroke [facility])    left facial droop    Patient Active Problem List   Diagnosis Date Noted  . Cerebrovascular accident (CVA) ([place]) 03/29/2016  . Ischemic stroke ([place]) 03/29/2016  . Essential hypertension   . Tobacco abuse     History reviewed. No pertinent surgical history.     Home Medications    Prior to Admission medications   Medication Sig Start Date End Date Taking? Authorizing Provider  allopurinol (ZYLOPRIM) 100 MG tablet Take 1 tablet by mouth daily. 02/06/16   Historical Provider, MD  aspirin 325 MG tablet Take 325 mg by mouth daily.    Historical Provider, MD  atorvastatin (LIPITOR) 40 MG tablet Take 1 tablet (40 mg total) by mouth daily at 6 PM. 03/30/16   Dron Tanna Furry, MD  Carboxymethylcellul-Glycerin 0.5-0.9 % SOLN Apply 1 drop to eye 3 (three) times daily as needed. 04/12/16   Isla Pence, MD  lisinopril-hydrochlorothiazide (PRINZIDE,ZESTORETIC) 20-25 MG tablet Take 1 tablet by mouth daily. 01/12/16   Historical Provider, MD  metFORMIN (GLUCOPHAGE) 500 MG tablet Take 1 tablet (500 mg total) by mouth 2 (two) times daily with a meal. 03/30/16   Dron Tanna Furry, MD  naproxen sodium (ANAPROX) 220 MG tablet Take 220 mg by mouth daily as needed (pain).    Historical Provider,  MD    Family History No family history on file.  Social History Social History  Substance Use Topics  . Smoking status: Current Some Day Smoker    Types: Cigarettes  . Smokeless tobacco: Former Systems developer  . Alcohol use No     Allergies   Patient has no known allergies.   Review of Systems Review of Systems  Eyes: Positive for redness.       Left eyelid paralysis  All other systems reviewed and are negative.    Physical Exam Updated Vital Signs BP 165/88   Pulse 93   Temp 97.5 F (36.4 C) (Oral)   Resp 17   Ht 6' (1.829 m)   Wt 202 lb (91.6 kg)   SpO2 97%   BMI 27.40 kg/m   Physical Exam  Constitutional: He is oriented to person, place, and time. He appears well-developed and well-nourished.  HENT:  Head: Normocephalic and atraumatic.  Right Ear: External ear normal.  Left Ear: External ear normal.  Nose: Nose normal.  Mouth/Throat: Oropharynx is clear and moist.  Eyes: EOM are normal. Pupils are equal, round, and reactive to light. Left conjunctiva is injected.  Left upper eyelid paralysis  Neck: Normal range of motion. Neck supple.  Cardiovascular: Normal rate, regular rhythm, normal heart sounds and intact distal pulses.  Pulmonary/Chest: Effort normal and breath sounds normal.  Abdominal: Soft. Bowel sounds are normal.  Musculoskeletal: Normal range of motion.  Neurological: He is alert and oriented to person, place, and time.  Left facial droop  Skin: Skin is warm. Capillary refill takes less than 2 seconds.  Psychiatric: He has a normal mood and affect. His behavior is normal. Judgment and thought content normal.  Nursing note and vitals reviewed.    ED Treatments / Results  Labs (all labs ordered are listed, but only abnormal results are displayed) Labs Reviewed - No data to display  EKG  EKG Interpretation None       Radiology Ct Head Wo Contrast  Result Date: 04/12/2016 CLINICAL DATA:  Cannot close left eye, left eye red and  watery  EXAM: CT HEAD WITHOUT CONTRAST TECHNIQUE: Contiguous axial images were obtained from the base of the skull through the vertex without intravenous contrast. COMPARISON:  MRI 03/29/2016, head CT 03/29/2016 FINDINGS: Brain: No acute territorial infarction, intracranial hemorrhage, or extra-axial fluid collection is seen. There is no focal mass, mass effect or midline shift. The previously noted tiny lacunar infarct in the left lower brain stem on MRI is not well appreciated on CT. Mild atrophy. Vascular: No hyperdense vessels.  Carotid artery calcifications. Skull: Mastoid air cells are clear. There is mastoid sclerosis. There is no fracture. Sinuses/Orbits: Mild mucosal thickening in the ethmoid and maxillary sinuses. No acute orbital abnormality. Other: None IMPRESSION: No CT evidence for acute intracranial abnormality. Electronically Signed   By: Donavan Foil M.D.   On: 04/12/2016 20:52    Procedures Procedures (including critical care time)  Medications Ordered in ED Medications  ketotifen (ZADITOR) 0.025 % ophthalmic solution 2 drop (2 drops Left Eye Given 04/12/16 2025)     Initial Impression / Assessment and Plan / ED Course  I have reviewed the triage vital signs and the nursing notes.  Pertinent labs & imaging results that were available during my care of the patient were reviewed by me and considered in my medical decision making (see chart for details).    Pt d/w Dr. Merlene Laughter who recommended another CT head to make sure there was not bleeding into the stroke.  CT ok.  Pt's eyelid taped shut.  Pt encouraged to keep eyes moist with refresh eye drops and taping of eye.  He is given the number of ophthalmology to f/u.  He knows to return if worse.  Final Clinical Impressions(s) / ED Diagnoses   Final diagnoses:  Facial nerve paralysis    New Prescriptions New Prescriptions   CARBOXYMETHYLCELLUL-GLYCERIN 0.5-0.9 % SOLN    Apply 1 drop to eye 3 (three) times daily as needed.       Isla Pence, MD 04/12/16 2118

## 2016-04-12 NOTE — ED Triage Notes (Signed)
Pt reports can not close left eye since Wednesday. Denies numbness/tingling. No slurred speech. Left eye red/watery. nad noted.

## 2016-04-12 NOTE — ED Notes (Signed)
Eye patch secured with tape placed over pt left eye after administering drops and closing eye lid per MD order.

## 2016-04-12 NOTE — ED Notes (Signed)
AC notified of need for Zaditor eyedrops.

## 2016-04-12 NOTE — ED Notes (Signed)
Pt taken to cT

## 2016-04-15 DIAGNOSIS — G510 Bell's palsy: Secondary | ICD-10-CM | POA: Diagnosis not present

## 2016-04-15 DIAGNOSIS — E119 Type 2 diabetes mellitus without complications: Secondary | ICD-10-CM | POA: Diagnosis not present

## 2016-04-16 DIAGNOSIS — G479 Sleep disorder, unspecified: Secondary | ICD-10-CM | POA: Diagnosis not present

## 2016-04-16 DIAGNOSIS — G510 Bell's palsy: Secondary | ICD-10-CM | POA: Diagnosis not present

## 2016-04-19 DIAGNOSIS — G510 Bell's palsy: Secondary | ICD-10-CM | POA: Diagnosis not present

## 2016-04-22 DIAGNOSIS — R270 Ataxia, unspecified: Secondary | ICD-10-CM | POA: Diagnosis not present

## 2016-04-22 DIAGNOSIS — R29810 Facial weakness: Secondary | ICD-10-CM | POA: Diagnosis not present

## 2016-04-22 DIAGNOSIS — I2510 Atherosclerotic heart disease of native coronary artery without angina pectoris: Secondary | ICD-10-CM | POA: Diagnosis not present

## 2016-04-22 DIAGNOSIS — E785 Hyperlipidemia, unspecified: Secondary | ICD-10-CM | POA: Diagnosis not present

## 2016-04-22 DIAGNOSIS — I69322 Dysarthria following cerebral infarction: Secondary | ICD-10-CM | POA: Diagnosis not present

## 2016-04-26 DIAGNOSIS — I2510 Atherosclerotic heart disease of native coronary artery without angina pectoris: Secondary | ICD-10-CM | POA: Diagnosis not present

## 2016-04-26 DIAGNOSIS — I6521 Occlusion and stenosis of right carotid artery: Secondary | ICD-10-CM | POA: Diagnosis not present

## 2016-04-26 DIAGNOSIS — I6990 Unspecified sequelae of unspecified cerebrovascular disease: Secondary | ICD-10-CM | POA: Diagnosis not present

## 2016-04-26 DIAGNOSIS — I10 Essential (primary) hypertension: Secondary | ICD-10-CM | POA: Diagnosis not present

## 2016-04-30 DIAGNOSIS — M1A0790 Idiopathic chronic gout, unspecified ankle and foot, without tophus (tophi): Secondary | ICD-10-CM | POA: Diagnosis not present

## 2016-05-06 DIAGNOSIS — R600 Localized edema: Secondary | ICD-10-CM | POA: Diagnosis not present

## 2016-05-06 DIAGNOSIS — E119 Type 2 diabetes mellitus without complications: Secondary | ICD-10-CM | POA: Diagnosis not present

## 2016-05-08 DIAGNOSIS — I2510 Atherosclerotic heart disease of native coronary artery without angina pectoris: Secondary | ICD-10-CM | POA: Diagnosis not present

## 2016-05-09 ENCOUNTER — Ambulatory Visit (HOSPITAL_COMMUNITY): Payer: PPO | Attending: Neurology | Admitting: Speech Pathology

## 2016-05-09 ENCOUNTER — Encounter (HOSPITAL_COMMUNITY): Payer: Self-pay | Admitting: Speech Pathology

## 2016-05-09 DIAGNOSIS — I69822 Dysarthria following other cerebrovascular disease: Secondary | ICD-10-CM | POA: Diagnosis not present

## 2016-05-09 NOTE — Therapy (Addendum)
[place], [place], [address] Phone: [phone]   Fax:  [phone]  Speech Language Pathology Evaluation  Patient Details  Name: Drew Harrison MRN: [id number] Date of Birth: 06-Dec-1950 Referring Provider: Phillips Odor  Encounter Date: 05/09/2016      End of Session - 05/09/16 2327    Visit Number 1   Number of Visits 1   Authorization Type [employer]   SLP Start Time 1033   SLP Stop Time  1118   SLP Time Calculation (min) 45 min   Activity Tolerance Patient tolerated treatment well      Past Medical History:  Diagnosis Date  . Diabetes mellitus without complication ([place])   . Gout   . Hyperlipidemia   . Hypertension   . Stroke [facility])    left facial droop    History reviewed. No pertinent surgical history.  There were no vitals filed for this visit.      Subjective Assessment - 05/09/16 2314    Subjective "I don't think I need to be here."   Currently in Pain? No/denies            SLP Evaluation [facility] - 05/09/16 2314      SLP Visit Information   SLP Received On 05/09/16   Referring Provider Kofi Doonquah   Onset Date 03/29/2016   Medical Diagnosis s/p left cerebellopontine CVA     Subjective   Subjective "I don't think I need to be here."     General Information   HPI 66 y.o. male with medical history significant of hypertension, borderline diabetes as per patient, actively smoker presented with left facial droop is associated with slurred speech and tingling sensation on left side. MRI Brain, Brain Stem 03/29/2016 Acute lacunar type infarct in the left lower brainstem at the pontomedullary junction, proably affecting the left facial nerve motor nucleus in this clinical setting No associated hemorrhage or mass effect.Generalized intracranial artery dolichoectasia. Otherwise largely unremarkable for age noncontrast MRI appearance of the brain.   Behavioral/Cognition Alert and  cooperative   Mobility Status Ambulatory     Prior Functional Status   Cognitive/Linguistic Baseline Within functional limits   Type of Home House    Lives With Alone   Available Support Family   Vocation On disability     Cognition   Overall Cognitive Status Within Functional Limits for tasks assessed     Auditory Comprehension   Overall Auditory Comprehension Appears within functional limits for tasks assessed   Yes/No Questions Within Functional Limits   Commands Within Functional Limits   Conversation Complex     Visual Recognition/Discrimination   Discrimination Within Function Limits     Reading Comprehension   Reading Status Not tested     Expression   Primary Mode of Expression Verbal     Verbal Expression   Overall Verbal Expression Appears within functional limits for tasks assessed   Initiation No impairment   Level of Generative/Spontaneous Verbalization Conversation   Repetition No impairment   Naming No impairment   Pragmatics No impairment   Non-Verbal Means of Communication Not applicable     Written Expression   Written Expression Not tested     Oral Motor/Sensory Function   Overall Oral Motor/Sensory Function Impaired   Labial ROM Reduced left   Labial Symmetry Abnormal symmetry left   Labial Strength Reduced Left   Labial Sensation Within Functional Limits   Labial Coordination WFL   Lingual ROM Within Functional  Limits   Lingual Symmetry Within Functional Limits   Lingual Strength Within Functional Limits   Lingual Sensation Within Functional Limits   Lingual Coordination WFL   Facial ROM Within Functional Limits   Facial Symmetry Within Functional Limits;Left drooping eyelid  very slight   Facial Strength Within Functional Limits   Facial Sensation Within Functional Limits   Facial Coordination WFL   Velum Within Functional Limits   Mandible Within Functional Limits     Motor Speech   Overall Motor Speech Appears within functional  limits for tasks assessed   Respiration Within functional limits   Phonation Normal   Resonance Within functional limits   Articulation Within functional limitis   Intelligibility Intelligible   Motor Planning Witnin functional limits   Motor Speech Errors Not applicable   Phonation WFL     Standardized Assessments   Standardized Assessments  --  informal measures           SLP Education - 05/09/16 2325    Education provided Yes   Education Details OME's recommended and taste sensation bombardment recommendations   Person(s) Educated Patient   Methods Explanation;Handout   Comprehension Verbalized understanding           Plan - 05/09/16 2327    Clinical Impression Statement Pt presents with fluent and intelligible speech despite mild labial weakness. Cognitive linguistic skills are WNL for tasks assessed. Pt reports dramatic improvement in previous symptoms (dysarthria and oral dysphagia due to suspected facial nerve impairment). Pt describes some dysgeusia, however unable to pinpoint if across all domains (sweet, sour, salty, bitter, umami). He reports that most things "have no taste". SLP encouraged Pt to bombard with all tastes (even trying things that do not typically appeal to him). No skilled SLP services indicated at this time. Pt was given my contact information should he have additional questions.   Consulted and Agree with Plan of Care Patient      Patient will benefit from skilled therapeutic intervention in order to improve the following deficits and impairments:   Dysarthria following other cerebrovascular disease    Problem List Patient Active Problem List   Diagnosis Date Noted  . Cerebrovascular accident (CVA) ([place]) 03/29/2016  . Ischemic stroke ([place]) 03/29/2016  . Essential hypertension   . Tobacco abuse    SLP G-Codes  Functional Assessment Tool Used clinical judgment   Functional Limitations Motor speech   Motor Speech Current Status [phone])  At least 1 percent but less than 20 percent impaired, limited or restricted   Motor Speech Goal Status [id number]) At least 1 percent but less than 20 percent impaired, limited or restricted   Motor Speech Goal Status [id number]) At least 1 percent but less than 20 percent impaired, limited or restricted    Thank you,  Genene Churn, [place]  [facility] 05/09/2016, 6:30 PM  [place], [place], [address] Phone: [phone]   Fax:  [phone]  Name: Drew Harrison MRN: [id number] Date of Birth: [birth year]

## 2016-05-10 DIAGNOSIS — E119 Type 2 diabetes mellitus without complications: Secondary | ICD-10-CM | POA: Diagnosis not present

## 2016-05-10 DIAGNOSIS — H16212 Exposure keratoconjunctivitis, left eye: Secondary | ICD-10-CM | POA: Diagnosis not present

## 2016-05-10 DIAGNOSIS — G510 Bell's palsy: Secondary | ICD-10-CM | POA: Diagnosis not present

## 2016-05-11 NOTE — H&P (Signed)
OFFICE VISIT NOTES COPIED TO EPIC FOR DOCUMENTATION  . History of Present Illness Drew Page MD; 04/26/2016 11:03 AM) Patient words: Last O/V 04/04/2016; 3-4 week f/u for HTN, Nuc, Carotid Doppler results.  The patient is a 66 year old male who presents for a follow-up for Coronary artery disease. Patient was admitted to Hospital with ischemic stroke, on 03/29/2016 with left facial droop and slurred speech and tingling sensation in the left side. He again presented on 04/12/2016 with left eyelid droop, repeat CT scan of the head did not reveal any new stroke. He is now being treated for Bell palsy and symptoms have essentially resolved.  His past medical history significant for hypertension, diabetes mellitus, tobacco use disorder. He has approximately a 60 pack year history of smoking. 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  MRA/MRI of the head that revealed mild atherosclerotic changes in the head, acute lacunar infarct left lower brainstem. Carotid duplex revealed 50-69% stenosis on the right.   During evaluation, echocardiogram had revealed inferior wall scarring and severe hypokinesis suggestive of CAD. Except for chronic dyspnea, patient is presently doing well and denies any previous chest pain are present chest pain. No palpitation, dizziness or syncope. His quit smoking since the stroke and is presently wearing the nicotine patch. This is day 28 office being abstinent from tobacco. He underwent stress testing in our office on 04/08/2016 and also carotid duplex and presents for follow-up. No new symptomatology. Stress test reveals high risk features with ischemic dilatation of the LV with inferior scar and mild peri-infarct ischemia. Echocardiogram revealed preserved LVEF.  Additional reasons for visit:  Follow-up for Shortness of breath    Problem List/Past Medical Anderson Malta Sergeant;  04/26/2016 10:47 AM) Benign essential hypertension (I10)  Hyperlipidemia, mixed (E78.2)  Atherosclerosis of native coronary artery of native heart without angina pectoris (I25.10)  Echocardiogram 03/30/2016: Normal LV systolic function EF [id number]. Probable severe hypokinesis and scarring in the basal inferolateral and inferior myocardium. Consider CAD. Grade 2 diastolic dysfunction. Mild left atrial enlargement. Lexiscan myoview stress test 04/08/2016: 1. Resting EKG 1 normal sinus rhythm, normal axis. Inferior infarct old, cannot exclude anterior infarct old. LVH, nonspecific T abnormality, cannot exclude inferior and lateral ischemia. Stress EKG is non-diagnostic for ischemia as it a pharmacologic stress using Lexiscan. 2. There is transient ischemic dilatationof the LV with left ventricular end-diastolic volume of A999333 mL in stress images. There is a very large size severe defect in the inferior, inferolateral wall extending from the base towards the apex with mild to moderate amount of pre-infarct ischemia. Left ventricular systolic function calculated by QGS was markedly depressed at 34% with generalized hypokinesis and inferior and lateral akinesis.This is a high risk study, consider further cardiac work-up. Labwork  03/30/2016: Cholesterol 222, triglycerides 187, HDL 47, LDL 138. A1c 6.9%. Carotid stenosis, right (I65.21)  Carotid artery duplex 04/09/2016: Stenosis in the right internal carotid artery (50-69%), lower end of spectrum. Mild stenosis in the right common carotid artery (<50%). Mild stenosis in the left common carotid artery (<50%). Mild stenosis in the left external carotid artery (<50%). There is severe soft plaque throughout the bilateral carotid arteries. Antegrade vertebral artery flow. Follow up in six months is appropriate if clinically indicated. 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 Otherwise largely unremarkable for age noncontrast MRI appearance of the brain. History of tobacco use disorder (Z87.891) 03/30/2016 Quit 03/30/2016:  Allergies Anderson Malta Sergeant; May 18, 2016 10:11 AM) No Known Drug Allergies 04/04/2016  Family History Anderson Malta Sergeant; 18-May-2016 10:11 AM) Father  Deceased. at age 60, heart disease Mother  Living, no heart conditions Sister 1  Deceased. younger, aneurysm Brother 1  younger, no known heart conditions  Social History Anderson Malta Sergeant; 05-18-16 10:11 AM) Marital status  Single. Number of Children  1. Current tobacco use  Former smoker. quit 03/29/2016 Alcohol Use  Occasional alcohol use. Living Situation  Lives alone.  Past Surgical History Anderson Malta Sergeant; 05-18-16 10:11 AM) Tonsillectomy   Medication History (April Louretta Shorten; 05/18/2016 10:21 AM) Valsartan-Hydrochlorothiazide (320-12.5MG  Tablet, 1 (one) Tablet Oral every morning, Taken starting 04/04/2016) Active. (Discontinue Lisinopril) Clopidogrel Bisulfate (75MG  Tablet, 1 (one) Tablet Oral daily, Taken starting 04/04/2016) Active. Aspirin (81MG  Tablet Chewable, 1 (one) Tablet Oral daily, Taken starting 04/04/2016) Active. Nicotine (21MG /24HR Patch 24HR, Transdermal daily, Taken starting 03/31/2016) Active. MetFORMIN HCl (500MG  Tablet, 1 Oral two times daily) Active. Metoprolol Tartrate (25MG  Tablet, 1 Oral two times daily) Active. Naproxen Sodium (220MG  Tablet, 1 Oral as needed) Active. Atorvastatin Calcium (40MG  Tablet, 1 Oral daily)  Active. Allopurinol (100MG  Tablet, 1 Oral daily) Active. PredniSONE (20MG  Tablet, 1 Oral daily) Active. Erythromycin (5MG /GM Ointment, apply to L eye Ophthalmic daily) Active. Medications Reconciled (Verbally)  Diagnostic Studies History (April Garrison; 2016-05-18 10:19 AM) Nuclear stress test 04/08/2016 1. Resting EKG 1 normal sinus rhythm, normal axis. Inferior infarct old, cannot exclude anterior infarct old. LVH, nonspecific T abnormality, cannot exclude inferior and lateral ischemia. Stress EKG is non-diagnostic for ischemia as it a pharmacologic stress using Lexiscan. 2. There is transient ischemic dilatationof the LV with left ventricular end-diastolic volume of A999333 mL in stress images. There is a very large size severe defect in the inferior, inferolateral wall extending from the base towards the apex with mild to moderate amount of pre-infarct ischemia. Left ventricular systolic function calculated by QGS was markedly depressed at 34% with generalized hypokinesis and inferior and lateral akinesis.This is a high risk study, consider further cardiac work-up. Carotid Doppler 04/09/2016 Stenosis in the right internal carotid artery (50-69%), lower end of spectrum. Mild stenosis in the right common carotid artery (<50%). Mild stenosis in the left common carotid artery (<50%). Mild stenosis in the left external carotid artery (<50%). There is severe soft plaque throughout the bilateral carotid arteries. Antegrade vertebral artery flow. Follow up in six months is appropriate if clinically indicated. MRI Brain, Brain Stem 03/29/2016 Acute lacunar type infarct in the left lower brainstem at the pontomedullary junction, proably affecting the left facial nerve motor nucleus in this clinical setting No associated hemorrhage or mass effect.Generalized intracranial artery dolichoectasia. Otherwise largely unremarkable for age noncontrast MRI appearance of the brain. CT Scan of Head 03/29/2016 No acute  finding. Echocardiogram 03/29/2016 Left ventricle:The cavity size was normal.Wall thickness was normal.Systolic function was normal.The estimated ejection fraction was in the range of 50% to 55%.Probable severe hypokinesis and scarring of the basalinferolateral and inferior myocardium;in the distribution of the right coronary or left circumflex coronary artery.Features are consistent with a pseudonormal left ventricular filling pattern,with concomitant abnormal relaxation and increasedfilling pressure(grade  2 diastolic dysfuntion).Mitral valve:There was mild to moderate regurgitation.Left Atrium:The Atrium was mildly dilated.    Review of Systems Drew Page, MD; 04/27/2016 7:18 AM) General Not Present- Anorexia, Fatigue and Fever. Respiratory Present- Difficulty Breathing on Exertion. Not Present- Cough, Wakes up from Sleep Wheezing or Short of Breath and Wheezing. Cardiovascular Not Present- Claudications, Edema, Orthopnea and Paroxysmal Nocturnal Dyspnea. Gastrointestinal Not Present- Black, Tarry Stool, Change in Bowel Habits and Nausea. Neurological Not Present- Focal Neurological Symptoms and Syncope. Endocrine Not Present- Cold Intolerance, Excessive Sweating, Heat Intolerance and Thyroid Problems. Hematology Not Present- Anemia, Easy Bruising, Petechiae and Prolonged Bleeding.  Vitals (April Garrison; 04/26/2016 10:22 AM) 04/26/2016 10:16 AM Weight: 196.06 lb Height: 72in Body Surface Area: 2.11 m Body Mass Index: 26.59 kg/m  Pulse: 98 (Regular)  P.OX: 96% (Room air) BP: 158/82 (Sitting, Left Arm, Standard)       Physical Exam Drew Page, MD; 04/27/2016 7:18 AM) General Mental Status-Alert. General Appearance-Cooperative and Appears stated age. Build & Nutrition-Well built and Well nourished.  Head and Neck Thyroid Gland Characteristics - normal size and consistency and no palpable nodules.  Chest and Lung Exam Chest and lung exam reveals  -quiet, even and easy respiratory effort with no use of accessory muscles, non-tender and on auscultation, normal breath sounds, no adventitious sounds.  Cardiovascular Cardiovascular examination reveals -normal heart sounds, regular rate and rhythm with no murmurs.  Abdomen Palpation/Percussion Normal exam - Non Tender and No hepatosplenomegaly.  Peripheral Vascular Lower Extremity Inspection - Bilateral - Inspection Normal. Palpation - Temperature - Bilateral - Normal. Edema - Bilateral - No edema. Femoral pulse - Bilateral - Normal. Popliteal pulse - Bilateral - Normal. Dorsalis pedis pulse - Bilateral - 1+. Posterior tibial pulse - Bilateral - Absent. Carotid arteries - Bilateral-No Carotid bruit. Abdomen-No prominent abdominal aortic pulsation, No epigastric bruit.  Neurologic Neurologic evaluation reveals -alert and oriented x 3 with no impairment of recent or remote memory. Motor-Grossly intact without any focal deficits.  Musculoskeletal Global Assessment Left Lower Extremity - no deformities, masses or tenderness, no known fractures. Right Lower Extremity - no deformities, masses or tenderness, no known fractures.   Results Drew Page MD; 04/27/2016 7:19 AM) Procedures  Name Value Date Myocardial perfusion imaging, tomographic (SPECT) (including attenuation correction, qualitative or quantitative wall motion, ejection fraction by first pass or gated technique, additional quantification, when performed); multiple studies, Comments: Lexiscan myoview stress test 04/08/2016: 1. Resting EKG 1 normal sinus rhythm, normal axis. Inferior infarct old, cannot exclude anterior infarct old. LVH, nonspecific T abnormality, cannot exclude inferior and lateral ischemia. Stress EKG is non-diagnostic for ischemia as it a pharmacologic stress using Lexiscan. 2. There is transient ischemic dilatationof the LV with left ventricular end-diastolic volume of A999333 mL in stress  images. There is a very large size severe defect in the inferior, inferolateral wall extending from the base towards the apex with mild to moderate amount of pre-infarct ischemia. Left ventricular systolic function calculated by QGS was markedly depressed at 34% with generalized hypokinesis and inferior and lateral akinesis.This is a high risk study, consider further cardiac work-up.  Performed: 04/09/2016 12:18 PM Complete duplex scan of bilateral carotid arteries [id number]) Comments: Carotid artery duplex 04/09/2016: Stenosis in the right internal carotid artery (50-69%), lower end of spectrum. Mild stenosis in the right common carotid artery (<50%). Mild stenosis in the left common carotid artery (<50%). Mild stenosis in the left external carotid artery (<50%). There is severe soft plaque throughout the bilateral carotid arteries.  Antegrade vertebral artery flow. Follow up in six months is appropriate if clinically indicated.  Performed: 04/16/2016 9:57 PM    Assessment & Plan Drew Page MD; 04/27/2016 7:19 AM) Atherosclerosis of native coronary artery of native heart without angina pectoris (I25.10) Story: Echocardiogram 03/30/2016: Normal LV systolic function EF [id number]. Probable severe hypokinesis and scarring in the basal inferolateral and inferior myocardium. Consider CAD. Grade 2 diastolic dysfunction. Mild left atrial enlargement.  Lexiscan myoview stress test 04/08/2016: 1. Resting EKG 1 normal sinus rhythm, normal axis. Inferior infarct old, cannot exclude anterior infarct old. LVH, nonspecific T abnormality, cannot exclude inferior and lateral ischemia. Stress EKG is non-diagnostic for ischemia as it a pharmacologic stress using Lexiscan. 2. There is transient ischemic dilatationof the LV with left ventricular end-diastolic volume of A999333 mL in stress images. There is a very large size severe defect in the inferior, inferolateral wall extending from the base towards the apex with mild to  moderate amount of pre-infarct ischemia. Left ventricular systolic function calculated by QGS was markedly depressed at 34% with generalized hypokinesis and inferior and lateral akinesis.This is a high risk study, consider further cardiac work-up. Impression: EKG 04/04/2016: Normal sinus rhythm at rate of 76 bpm, normal axis. Inferior infarct old. Cannot exclude anterior infarct old. LVH with repolarization abnormality, cannot exclude lateral ischemia. PVC. Current Plans Discontinued Metoprolol Tartrate 25MG  (Increase ti 50 BID). Started Metoprolol Tartrate 50MG , 1 (one) Tablet two times daily, #180, 04/26/2016, Ref. x1. Continued Clopidogrel Bisulfate 75MG , 1 (one) Tablet daily, #90, 04/26/2016, Ref. x2. Future Plans [id number]: METABOLIC PANEL, BASIC (99991111) - one time 05/06/2016: CBC & PLATELETS (AUTO) [id number]) - one time 05/06/2016: PT (PROTHROMBIN TIME) (09811) - one time Benign essential hypertension (I10) Current Plans Continued Valsartan-Hydrochlorothiazide 320-12.5MG , 1 (one) Tablet every morning, #90, 30 days starting 04/26/2016, Ref. x1. Local Order: Discontinue Lisinopril History of cerebrovascular accident with residual effects (I69.90) Story: MRI Brain, Brain Stem 03/29/2016 Acute lacunar type infarct in the left lower brainstem at the pontomedullary junction, proably affecting the left facial nerve motor nucleus in this clinical setting No associated hemorrhage or mass effect.Generalized intracranial artery dolichoectasia. Otherwise largely unremarkable for age noncontrast MRI appearance of the brain. Carotid stenosis, right (I65.21) Story: Carotid artery duplex 04/09/2016: Stenosis in the right internal carotid artery (50-69%), lower end of spectrum. Mild stenosis in the right common carotid artery (<50%). Mild stenosis in the left common carotid artery (<50%). Mild stenosis in the left external carotid artery (<50%). There is severe soft plaque throughout the bilateral carotid  arteries. Antegrade vertebral artery flow. Follow up in six months is appropriate if clinically indicated. Future Plans 10/17/2016: Complete duplex scan of bilateral carotid arteries [id number]) - one time Hyperlipidemia, mixed (E78.2) Current Plans Started Atorvastatin Calcium 40MG , 1 Tablet daily, #30, 04/26/2016, Ref. x1. Local Order: Continuation of therapy Labwork Story: 03/30/2016: Cholesterol 222, triglycerides 187, HDL 47, LDL 138. A1c 6.9%. Current Plans Mechanism of underlying disease process and action of medications discussed with the patient. I discussed primary/secondary prevention and also dietary councelling done. Patient is here on a two-week follow-up and management of hypertension, hyperlipidemia and recent stroke. I have discussed the findings of the carotid duplex, the carotid duplex matches up with the MRI findings and there is no high-grade lesion. We will continue medical management. Carotid duplex reveals extensive soft plaque at risk for cardioembolic phenomena.  I also discussed with him regarding the high risk stress test. Multivessel disease cannot be excluded. He blood pressure slightly elevated today will increase  metoprolol from 25-50 b.i.d. Otherwise continue his present medications, I'll set him up for left heart catheterization in 2 weeks. Have discussed regarding weight gain since he has quit smoking. He will get his labs done by his PCP within one week from time off coronary angiography.  CC: Dewayne Shorter, PA-C    Signed by Drew Page, MD (04/27/2016 7:19 AM)

## 2016-05-14 ENCOUNTER — Encounter (HOSPITAL_COMMUNITY)
Admission: RE | Disposition: A | Discharge status: Home / Self Care | Payer: Self-pay | Source: Ambulatory Visit | Attending: Cardiology

## 2016-05-14 ENCOUNTER — Ambulatory Visit (HOSPITAL_COMMUNITY)
Admission: RE | Admit: 2016-05-14 | Discharge: 2016-05-14 | Disposition: A | Discharge status: Home / Self Care | Payer: PPO | Source: Ambulatory Visit | Attending: Cardiology | Admitting: Cardiology

## 2016-05-14 DIAGNOSIS — E782 Mixed hyperlipidemia: Secondary | ICD-10-CM | POA: Diagnosis not present

## 2016-05-14 DIAGNOSIS — I2510 Atherosclerotic heart disease of native coronary artery without angina pectoris: Secondary | ICD-10-CM | POA: Diagnosis not present

## 2016-05-14 DIAGNOSIS — Z79899 Other long term (current) drug therapy: Secondary | ICD-10-CM | POA: Insufficient documentation

## 2016-05-14 DIAGNOSIS — I2541 Coronary artery aneurysm: Secondary | ICD-10-CM | POA: Diagnosis not present

## 2016-05-14 DIAGNOSIS — I2582 Chronic total occlusion of coronary artery: Secondary | ICD-10-CM | POA: Diagnosis not present

## 2016-05-14 DIAGNOSIS — Z8249 Family history of ischemic heart disease and other diseases of the circulatory system: Secondary | ICD-10-CM | POA: Diagnosis not present

## 2016-05-14 DIAGNOSIS — Z87891 Personal history of nicotine dependence: Secondary | ICD-10-CM | POA: Insufficient documentation

## 2016-05-14 DIAGNOSIS — Z7952 Long term (current) use of systemic steroids: Secondary | ICD-10-CM | POA: Insufficient documentation

## 2016-05-14 DIAGNOSIS — I10 Essential (primary) hypertension: Secondary | ICD-10-CM | POA: Insufficient documentation

## 2016-05-14 DIAGNOSIS — E119 Type 2 diabetes mellitus without complications: Secondary | ICD-10-CM | POA: Insufficient documentation

## 2016-05-14 DIAGNOSIS — Z8673 Personal history of transient ischemic attack (TIA), and cerebral infarction without residual deficits: Secondary | ICD-10-CM | POA: Insufficient documentation

## 2016-05-14 DIAGNOSIS — R9439 Abnormal result of other cardiovascular function study: Secondary | ICD-10-CM | POA: Diagnosis present

## 2016-05-14 DIAGNOSIS — Z7982 Long term (current) use of aspirin: Secondary | ICD-10-CM | POA: Insufficient documentation

## 2016-05-14 DIAGNOSIS — Z7984 Long term (current) use of oral hypoglycemic drugs: Secondary | ICD-10-CM | POA: Insufficient documentation

## 2016-05-14 HISTORY — PX: LEFT HEART CATH AND CORONARY ANGIOGRAPHY: CATH118249

## 2016-05-14 LAB — GLUCOSE, CAPILLARY
Glucose-Capillary: 347 mg/dL — ABNORMAL HIGH (ref 65–99)
Glucose-Capillary: 287 mg/dL — ABNORMAL HIGH (ref 65–99)

## 2016-05-14 SURGERY — LEFT HEART CATH AND CORONARY ANGIOGRAPHY
Anesthesia: LOCAL

## 2016-05-14 MED ORDER — MIDAZOLAM HCL 2 MG/2ML IJ SOLN
INTRAMUSCULAR | Status: AC
  Filled 2016-05-14: qty 2

## 2016-05-14 MED ORDER — SODIUM CHLORIDE 0.9% FLUSH: 3.0000 mL | INTRAVENOUS | Status: DC | PRN

## 2016-05-14 MED ORDER — LIDOCAINE HCL (PF) 1 % IJ SOLN
INTRAMUSCULAR | Status: AC
  Filled 2016-05-14: qty 30

## 2016-05-14 MED ORDER — METFORMIN HCL 500 MG PO TABS: 500.0000 mg | ORAL_TABLET | Freq: Two times a day (BID) | ORAL | 0 refills | Status: DC

## 2016-05-14 MED ORDER — LIDOCAINE HCL (PF) 1 % IJ SOLN
INTRAMUSCULAR | Status: DC | PRN
  Administered 2016-05-14: 10 mL via INTRA_ARTERIAL

## 2016-05-14 MED ORDER — SODIUM CHLORIDE 0.9% FLUSH: 3.0000 mL | Freq: Two times a day (BID) | INTRAVENOUS | Status: DC

## 2016-05-14 MED ORDER — HEPARIN (PORCINE) IN NACL 2-0.9 UNIT/ML-% IJ SOLN
INTRAMUSCULAR | Status: DC | PRN
Start: 2016-05-14 — End: 2016-05-14
  Administered 2016-05-14: 1000 mL

## 2016-05-14 MED ORDER — SODIUM CHLORIDE 0.9 % WEIGHT BASED INFUSION
3.0000 mL/kg/h | INTRAVENOUS | Status: DC
  Administered 2016-05-14: 3 mL/kg/h via INTRAVENOUS

## 2016-05-14 MED ORDER — SODIUM CHLORIDE 0.9 % WEIGHT BASED INFUSION: 1.0000 mL/kg/h | INTRAVENOUS | Status: DC

## 2016-05-14 MED ORDER — HYDROMORPHONE HCL 1 MG/ML IJ SOLN
INTRAMUSCULAR | Status: AC
  Filled 2016-05-14: qty 1

## 2016-05-14 MED ORDER — HEPARIN (PORCINE) IN NACL 2-0.9 UNIT/ML-% IJ SOLN
INTRAMUSCULAR | Status: AC
  Filled 2016-05-14: qty 1000

## 2016-05-14 MED ORDER — HEPARIN SODIUM (PORCINE) 1000 UNIT/ML IJ SOLN
INTRAMUSCULAR | Status: AC
  Filled 2016-05-14: qty 1

## 2016-05-14 MED ORDER — IOPAMIDOL (ISOVUE-370) INJECTION 76%
INTRAVENOUS | Status: AC
  Filled 2016-05-14: qty 100

## 2016-05-14 MED ORDER — SODIUM CHLORIDE 0.9 % IV SOLN: 250.0000 mL | INTRAVENOUS | Status: DC | PRN

## 2016-05-14 MED ORDER — IOPAMIDOL (ISOVUE-370) INJECTION 76%
INTRAVENOUS | Status: DC | PRN
  Administered 2016-05-14: 100 mL via INTRAVENOUS

## 2016-05-14 MED ORDER — MIDAZOLAM HCL 2 MG/2ML IJ SOLN
INTRAMUSCULAR | Status: DC | PRN
  Administered 2016-05-14: 2 mg via INTRAVENOUS

## 2016-05-14 MED ORDER — HYDROMORPHONE HCL 1 MG/ML IJ SOLN
INTRAMUSCULAR | Status: DC | PRN
  Administered 2016-05-14: 0.5 mg via INTRAVENOUS

## 2016-05-14 MED ORDER — VERAPAMIL HCL 2.5 MG/ML IV SOLN
INTRAVENOUS | Status: AC
  Filled 2016-05-14: qty 2

## 2016-05-14 MED ORDER — LIDOCAINE HCL (PF) 1 % IJ SOLN
INTRAMUSCULAR | Status: DC | PRN
  Administered 2016-05-14: 2 mL

## 2016-05-14 MED ORDER — SODIUM CHLORIDE 0.9 % WEIGHT BASED INFUSION: 1.0000 mL/kg/h | INTRAVENOUS | Status: AC

## 2016-05-14 MED ORDER — HEPARIN SODIUM (PORCINE) 1000 UNIT/ML IJ SOLN
INTRAMUSCULAR | Status: DC | PRN
  Administered 2016-05-14: 5000 [IU] via INTRAVENOUS

## 2016-05-14 MED ORDER — NITROGLYCERIN 1 MG/10 ML FOR IR/CATH LAB
INTRA_ARTERIAL | Status: AC
  Filled 2016-05-14: qty 10

## 2016-05-14 MED ORDER — ASPIRIN 81 MG PO CHEW: 81.0000 mg | CHEWABLE_TABLET | ORAL | Status: DC

## 2016-05-14 SURGICAL SUPPLY — 9 items
CATH OPTITORQUE TIG 4.0 5F (CATHETERS) ×2 IMPLANT
DEVICE RAD COMP TR BAND LRG (VASCULAR PRODUCTS) ×2 IMPLANT
GLIDESHEATH SLEND A-KIT 6F 20G (SHEATH) ×2 IMPLANT
GUIDEWIRE INQWIRE 1.5J.035X260 (WIRE) ×1 IMPLANT
INQWIRE 1.5J .035X260CM (WIRE) ×2
KIT HEART LEFT (KITS) ×2 IMPLANT
PACK CARDIAC CATHETERIZATION (CUSTOM PROCEDURE TRAY) ×2 IMPLANT
TRANSDUCER W/STOPCOCK (MISCELLANEOUS) ×4 IMPLANT
TUBING CIL FLEX 10 FLL-RA (TUBING) ×2 IMPLANT

## 2016-05-14 NOTE — Discharge Instructions (Signed)
NO METFORMIN/GLUCOPHAGE FOR 2 DAYS    Radial Site Care Introduction Refer to this sheet in the next few weeks. These instructions provide you with information about caring for yourself after your procedure. Your health care provider may also give you more specific instructions. Your treatment has been planned according to current medical practices, but problems sometimes occur. Call your health care provider if you have any problems or questions after your procedure. What can I expect after the procedure? After your procedure, it is typical to have the following:  Bruising at the radial site that usually fades within 1-2 weeks.  Blood collecting in the tissue (hematoma) that may be painful to the touch. It should usually decrease in size and tenderness within 1-2 weeks. Follow these instructions at home:  Take medicines only as directed by your health care provider.  You may shower 24-48 hours after the procedure or as directed by your health care provider. Remove the bandage (dressing) and gently wash the site with plain soap and water. Pat the area dry with a clean towel. Do not rub the site, because this may cause bleeding.  Do not take baths, swim, or use a hot tub until your health care provider approves.  Check your insertion site every day for redness, swelling, or drainage.  Do not apply powder or lotion to the site.  Do not flex or bend the affected arm for 24 hours or as directed by your health care provider.  Do not push or pull heavy objects with the affected arm for 24 hours or as directed by your health care provider.  Do not lift over 10 lb (4.5 kg) for 5 days after your procedure or as directed by your health care provider.  Ask your health care provider when it is okay to:  Return to work or school.  Resume usual physical activities or sports.  Resume sexual activity.  Do not drive home if you are discharged the same day as the procedure. Have someone else  drive you.  You may drive 24 hours after the procedure unless otherwise instructed by your health care provider.  Do not operate machinery or power tools for 24 hours after the procedure.  If your procedure was done as an outpatient procedure, which means that you went home the same day as your procedure, a responsible adult should be with you for the first 24 hours after you arrive home.  Keep all follow-up visits as directed by your health care provider. This is important. Contact a health care provider if:  You have a fever.  You have chills.  You have increased bleeding from the radial site. Hold pressure on the site. CALL 911 Get help right away if:  You have unusual pain at the radial site.  You have redness, warmth, or swelling at the radial site.  You have drainage (other than a small amount of blood on the dressing) from the radial site.  The radial site is bleeding, and the bleeding does not stop after 30 minutes of holding steady pressure on the site.  Your arm or hand becomes pale, cool, tingly, or numb. This information is not intended to replace advice given to you by your health care provider. Make sure you discuss any questions you have with your health care provider. Document Released: 04/13/2010 Document Revised: 08/17/2015 Document Reviewed: 09/27/2013  2017 Elsevier

## 2016-05-14 NOTE — Interval H&P Note (Signed)
History and Physical Interval Note:  05/14/2016 9:35 AM  Len Childs  has presented today for surgery, with the diagnosis of abn stress  The various methods of treatment have been discussed with the patient and family. After consideration of risks, benefits and other options for treatment, the patient has consented to  Procedure(s): Left Heart Cath and Coronary Angiography (N/A) and possible PCI as a surgical intervention .  The patient's history has been reviewed, patient examined, no change in status, stable for surgery.  I have reviewed the patient's chart and labs.  Questions were answered to the patient's satisfaction.   Ischemic Symptoms? CCS II (Slight limitation of ordinary activity) Anti-ischemic Medical Therapy? Minimal Therapy (1 class of medications) Non-invasive Test Results? High-risk stress test findings: cardiac mortality >3%/yr Prior CABG? No Previous CABG   Patient Information:   1-2V CAD, no prox LAD  A (7)  Indication: 18; Score: 7   Patient Information:   CTO of 1 vessel, no other CAD  U (5)  Indication: 28; Score: 5   Patient Information:   1V CAD with prox LAD  A (8)  Indication: 34; Score: 8   Patient Information:   2V-CAD with prox LAD  A (8)  Indication: 40; Score: 8   Patient Information:   3V-CAD without LMCA  A (8)  Indication: 46; Score: 8   Patient Information:   3V-CAD without LMCA With Abnormal LV systolic function  A (9)  Indication: 48; Score: 9   Patient Information:   LMCA-CAD  A (9)  Indication: 49; Score: 9   Patient Information:   2V-CAD with prox LAD PCI  A (7)  Indication: 62; Score: 7   Patient Information:   2V-CAD with prox LAD CABG  A (8)  Indication: 62; Score: 8   Patient Information:   3V-CAD without LMCA With Low CAD burden(i.e., 3 focal stenoses, low SYNTAX score) PCI  A (7)  Indication: 63; Score: 7   Patient Information:   3V-CAD without LMCA With Low CAD burden(i.e., 3  focal stenoses, low SYNTAX score) CABG  A (9)  Indication: 63; Score: 9   Patient Information:   3V-CAD without LMCA E06c - Intermediate-high CAD burden (i.e., multiple diffuse lesions, presence of CTO, or high SYNTAX score) PCI  U (4)  Indication: 64; Score: 4   Patient Information:   3V-CAD without LMCA E06c - Intermediate-high CAD burden (i.e., multiple diffuse lesions, presence of CTO, or high SYNTAX score) CABG  A (9)  Indication: 64; Score: 9   Patient Information:   LMCA-CAD With Isolated LMCA stenosis  PCI  U (6)  Indication: 65; Score: 6   Patient Information:   LMCA-CAD With Isolated LMCA stenosis  CABG  A (9)  Indication: 65; Score: 9   Patient Information:   LMCA-CAD Additional CAD, low CAD burden (i.e., 1- to 2-vessel additional involvement, low SYNTAX score) PCI  U (5)  Indication: 66; Score: 5   Patient Information:   LMCA-CAD Additional CAD, low CAD burden (i.e., 1- to 2-vessel additional involvement, low SYNTAX score) CABG  A (9)  Indication: 66; Score: 9   Patient Information:   LMCA-CAD Additional CAD, intermediate-high CAD burden (i.e., 3-vessel involvement, presence of CTO, or high SYNTAX score) PCI  I (3)  Indication: 67; Score: 3   Patient Information:   LMCA-CAD Additional CAD, intermediate-high CAD burden (i.e., 3-vessel involvement, presence of CTO, or high SYNTAX score) CABG  A (9)  Indication: 67; Score: 9   Zurii Hewes

## 2016-05-15 ENCOUNTER — Encounter (HOSPITAL_COMMUNITY): Payer: Self-pay | Admitting: Cardiology

## 2016-05-21 DIAGNOSIS — I2510 Atherosclerotic heart disease of native coronary artery without angina pectoris: Secondary | ICD-10-CM | POA: Diagnosis not present

## 2016-05-21 DIAGNOSIS — R270 Ataxia, unspecified: Secondary | ICD-10-CM | POA: Diagnosis not present

## 2016-05-21 DIAGNOSIS — R29810 Facial weakness: Secondary | ICD-10-CM | POA: Diagnosis not present

## 2016-05-21 DIAGNOSIS — I69322 Dysarthria following cerebral infarction: Secondary | ICD-10-CM | POA: Diagnosis not present

## 2016-05-23 DIAGNOSIS — I6990 Unspecified sequelae of unspecified cerebrovascular disease: Secondary | ICD-10-CM | POA: Diagnosis not present

## 2016-05-23 DIAGNOSIS — I10 Essential (primary) hypertension: Secondary | ICD-10-CM | POA: Diagnosis not present

## 2016-05-23 DIAGNOSIS — I2510 Atherosclerotic heart disease of native coronary artery without angina pectoris: Secondary | ICD-10-CM | POA: Diagnosis not present

## 2016-05-23 DIAGNOSIS — I6521 Occlusion and stenosis of right carotid artery: Secondary | ICD-10-CM | POA: Diagnosis not present

## 2016-05-29 DIAGNOSIS — H33313 Horseshoe tear of retina without detachment, bilateral: Secondary | ICD-10-CM | POA: Diagnosis not present

## 2016-05-29 DIAGNOSIS — Z136 Encounter for screening for cardiovascular disorders: Secondary | ICD-10-CM | POA: Diagnosis not present

## 2016-06-03 ENCOUNTER — Encounter: Payer: PPO | Admitting: Cardiothoracic Surgery

## 2016-06-04 ENCOUNTER — Institutional Professional Consult (permissible substitution) (INDEPENDENT_AMBULATORY_CARE_PROVIDER_SITE_OTHER): Payer: PPO | Admitting: Cardiothoracic Surgery

## 2016-06-04 ENCOUNTER — Encounter: Payer: Self-pay | Admitting: Cardiothoracic Surgery

## 2016-06-04 ENCOUNTER — Encounter: Payer: PPO | Admitting: Cardiothoracic Surgery

## 2016-06-04 ENCOUNTER — Other Ambulatory Visit: Payer: Self-pay | Admitting: *Deleted

## 2016-06-04 VITALS — BP 138/74 | HR 88 | Resp 16 | Ht 72.0 in | Wt 195.0 lb

## 2016-06-04 DIAGNOSIS — I700 Atherosclerosis of aorta: Secondary | ICD-10-CM

## 2016-06-04 DIAGNOSIS — I2510 Atherosclerotic heart disease of native coronary artery without angina pectoris: Secondary | ICD-10-CM

## 2016-06-04 DIAGNOSIS — I638 Other cerebral infarction: Secondary | ICD-10-CM

## 2016-06-04 DIAGNOSIS — I6389 Other cerebral infarction: Secondary | ICD-10-CM

## 2016-06-04 DIAGNOSIS — Z01818 Encounter for other preprocedural examination: Secondary | ICD-10-CM

## 2016-06-04 NOTE — Progress Notes (Signed)
PCP is [facility] Referring Provider is Adrian Prows, MD  Chief Complaint  Patient presents with  . Coronary Artery Disease    eval for CABG...CATH 05/14/16, ECHO 03/30/16, CAROTID DOPPLER 04/09/16, + STRESS TEST     Patient examined, coronary angiogram and echocardiogram personally reviewed and counseled with patient  HPI: 66 year old diabetic  recently reformed smoker presents for discussion of his recent diagnosis of severe three-vessel coronary disease with moderate LV dysfunction.  The patient was admitted to [facility] in early January with acute stroke with left facial droop and left body numbness and some aphasia. CT-MRI brain demonstrated a lacunar infarct in the left brainstem at the ponto medullary junction which probably affected the left facial nerve motor nucleus-no associated hemorrhage or mass effect. During evaluation echocardiogram was performed which showed moderate-severe LV dysfunction suggestive of coronary disease. The patient denied any symptoms of chest pain or dyspnea. He stopped smoking after 35 pack years following the stroke. The patient was evaluated by Dr. Nadyne Coombes and stress test was markedly positive. Carotid duplex demonstrated a 50-60 percent right ICA stenosis.  Patient socially underwent left heart cath via radial artery by Dr. Einar Gip. This demonstrated severe three-vessel coronary disease with chronic occlusion the RCA, 90% stenosis the proximal LAD, 80% stenosis of a diagonal, chronic occlusion of the nondominant circumflex and 90% stenosis of the ramus intermediate. Review of the echocardiogram shows no significant valvular disease with inferior-lateral hypokinesia and EF of 35%.  Because of the patient's multivessel CAD, diabetes, and significant LV dysfunction is felt to be a candidate for surgical coronary revascularization.  The patient presents today without recent exacerbation of his left brain stem stroke. He denies active  symptoms of heart failure or angina. The patient has gained 11 pounds in weight after smoking cessation.  Past Medical History:  Diagnosis Date  . Diabetes mellitus without complication ([place])   . Gout   . Hyperlipidemia   . Hypertension   . Stroke [facility])    left facial droop    Past Surgical History:  Procedure Laterality Date  . LEFT HEART CATH AND CORONARY ANGIOGRAPHY N/A 05/14/2016   Procedure: Left Heart Cath and Coronary Angiography;  Surgeon: Adrian Prows, MD;  Location: [facility];  Service: Cardiovascular;  Laterality: N/A;    No family history on file.  Social History Social History  Substance Use Topics  . Smoking status: Former Smoker    Packs/day: 1.50    Years: 48.00    Types: Cigarettes    Quit date: 05/07/2016  . Smokeless tobacco: Never Used  . Alcohol use No    Current Outpatient Prescriptions  Medication Sig Dispense Refill  . allopurinol (ZYLOPRIM) 100 MG tablet Take 1 tablet by mouth 2 (two) times daily.   0  . aspirin 81 MG tablet Take 81 mg by mouth daily.     Marland Kitchen atorvastatin (LIPITOR) 40 MG tablet Take 1 tablet (40 mg total) by mouth daily at 6 PM. 30 tablet 0  . Carboxymethylcellul-Glycerin 0.5-0.9 % SOLN Apply 1 drop to eye 3 (three) times daily as needed. 15 mL 0  . clopidogrel (PLAVIX) 75 MG tablet Take 75 mg by mouth daily.    . famotidine (PEPCID) 40 MG tablet Take 40 mg by mouth 2 (two) times daily.    . metFORMIN (GLUCOPHAGE) 500 MG tablet Take 1 tablet (500 mg total) by mouth 2 (two) times daily with a meal. 60 tablet 0  . metoprolol tartrate (LOPRESSOR) 25 MG  tablet Take 1 tablet by mouth 2 (two) times daily.    . naproxen sodium (ANAPROX) 220 MG tablet Take 220 mg by mouth daily as needed (pain).    . valsartan-hydrochlorothiazide (DIOVAN-HCT) 320-12.5 MG tablet Take 1 tablet by mouth 2 (two) times daily.  0   No current facility-administered medications for this visit.     No Known Allergies  Review of Systems        Review of  Systems :  [ y ] = yes, [  ] = no        General :  Weight gain [ yes  ]    Weight loss  [   ]  Fatigue [  ]  Fever [  ]  Chills  [  ]                                Weakness  [  ]           HEENT    Headache [  ]  Dizziness [  ]  Blurred vision [yes-recent cataract surgery  ] Glaucoma  [  ]                          Nosebleeds [  ] Painful or loose teeth [  ]        Cardiac :  Chest pain/ pressure [  ]  Resting SOB [  ] exertional SOB [ mild ]                        Orthopnea [  ]  Pedal edema  [  ]  Palpitations [  ] Syncope/presyncope [ ]                         Paroxysmal nocturnal dyspnea [  ]         Pulmonary : cough [ yes nonproductive ]  wheezing [  ]  Hemoptysis [  ] Sputum [  ] Snoring [  ]                              Pneumothorax [  ]  Sleep apnea [  ]        GI : Vomiting [ yes ]  Dysphagia [  ]  Melena  [  ]  Abdominal pain [  ] BRBPR [  ]              Heart burn [ yes ]  Constipation [  ] Diarrhea  [  ] Colonoscopy [   ]        GU : Hematuria [  ]  Dysuria [  ]  Nocturia [  ] UTI's [  ]        Vascular : Claudication [  ]  Rest pain [  ]  DVT [  ] Vein stripping [  ] leg ulcers [  ]                          TIA [  ] Stroke [  ]  Varicose veins [  ]        NEURO :  Headaches  [  ] Seizures [  ] Vision changes [  ]  Paresthesias [yes diabetic neuropathy  ]     right-hand dominant, improvement of left facial droop, left side numbness and dysarthria from left lacunar stroke January 2018                                  Seizures [  ]        Musculoskeletal :  Arthritis [  ] Gout  [  ]  Back pain [ yes history of compression fracture lumbar spine ]  Joint pain [  ]        Skin :  Rash [  ]  Melanoma [  ] Sores [  ]        Heme : Bleeding problems [  ]Clotting Disorders [  ] Anemia [  ]Blood Transfusion [ ]         Endocrine : Diabetes [ yes oral medication ] Heat or Cold intolerance [  ] Polyuria [  ]excessive thirst [ ]         Psych : Depression [  ]  Anxiety [  ]  Psych  hospitalizations [  ] Memory change [  ]                                        BP 138/74 (BP Location: Left Arm, Patient Position: Sitting, Cuff Size: Large)   Pulse 88   Resp 16   Ht 6' (1.829 m)   Wt 195 lb (88.5 kg)   SpO2 95% Comment: ON RA  BMI 26.45 kg/m  Physical Exam     Physical Exam  General: Well-nourished middle-aged Caucasian male no acute distress but anxious HEENT: Normocephalic pupils equal , dentition adequate Neck: Supple without JVD, adenopathy, or bruit Chest: Clear to auscultation, symmetrical breath sounds, no rhonchi, no tenderness             or deformity Cardiovascular: Regular rate and rhythm, no murmur, no gallop, peripheral pulses             palpable in all extremities Abdomen:  Soft, nontender, no palpable mass or organomegaly Extremities: Warm, well-perfused, no clubbing cyanosis edema or tenderness,              no venous stasis changes of the legs Rectal/GU: Deferred Neuro: Grossly non--focal and symmetrical throughout Skin: Clean and dry without rash or ulceration   Diagnostic Tests: Coronary angiogram, echocardiogram reviewed and results noted as above  Impression: Severe three-vessel coronary disease Moderate to severe LV dysfunction related to 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 He will stop his Plavix in preparation for scheduled surgery on March 19 I discussed the indications benefits and risks of CABG with the patient and he demonstrates his understanding and agrees to proceed. He understands with a recent stroke that risk of  recurrent or extended stroke is possible as well as risk of bleeding, postoperative pulmonary problems from heavy smoking history, infection, and death.  Len Childs, MD [facility] (  [phone] 

## 2016-06-05 ENCOUNTER — Ambulatory Visit (HOSPITAL_COMMUNITY)
Admission: RE | Admit: 2016-06-05 | Discharge: 2016-06-05 | Disposition: A | Discharge status: Home / Self Care | Payer: PPO | Source: Ambulatory Visit | Attending: Cardiothoracic Surgery | Admitting: Cardiothoracic Surgery

## 2016-06-05 ENCOUNTER — Other Ambulatory Visit: Payer: Self-pay | Admitting: *Deleted

## 2016-06-05 DIAGNOSIS — I2510 Atherosclerotic heart disease of native coronary artery without angina pectoris: Secondary | ICD-10-CM | POA: Diagnosis not present

## 2016-06-05 DIAGNOSIS — F17210 Nicotine dependence, cigarettes, uncomplicated: Secondary | ICD-10-CM | POA: Insufficient documentation

## 2016-06-05 DIAGNOSIS — R942 Abnormal results of pulmonary function studies: Secondary | ICD-10-CM | POA: Diagnosis not present

## 2016-06-05 DIAGNOSIS — H33313 Horseshoe tear of retina without detachment, bilateral: Secondary | ICD-10-CM | POA: Diagnosis not present

## 2016-06-05 LAB — PULMONARY FUNCTION TEST
DL/VA % pred: 42 %
DL/VA: 2.01 ml/min/mmHg/L
DLCO cor % pred: 38 %
DLCO cor: 13.53 ml/min/mmHg
DLCO unc % pred: 38 %
DLCO unc: 13.53 ml/min/mmHg
FEF 25-75 Post: 2.4 L/sec
FEF 25-75 Pre: 1.56 L/sec
FEF2575-%Change-Post: 54 %
FEF2575-%Pred-Post: 83 %
FEF2575-%Pred-Pre: 54 %
FEV1-%Change-Post: 10 %
FEV1-%Pred-Post: 82 %
FEV1-%Pred-Pre: 74 %
FEV1-Post: 3.01 L
FEV1-Pre: 2.72 L
FEV1FVC-%Change-Post: -1 %
FEV1FVC-%Pred-Pre: 92 %
FEV6-%Change-Post: 11 %
FEV6-%Pred-Post: 93 %
FEV6-%Pred-Pre: 84 %
FEV6-Post: 4.39 L
FEV6-Pre: 3.94 L
FEV6FVC-%Change-Post: 0 %
FEV6FVC-%Pred-Post: 104 %
FEV6FVC-%Pred-Pre: 104 %
FVC-%Change-Post: 11 %
FVC-%Pred-Post: 90 %
FVC-%Pred-Pre: 80 %
FVC-Post: 4.44 L
FVC-Pre: 3.97 L
Post FEV1/FVC ratio: 68 %
Post FEV6/FVC ratio: 99 %
Pre FEV1/FVC ratio: 69 %
Pre FEV6/FVC Ratio: 99 %
RV % pred: 93 %
RV: 2.32 L
TLC % pred: 89 %
TLC: 6.63 L

## 2016-06-05 MED ORDER — ALBUTEROL SULFATE (2.5 MG/3ML) 0.083% IN NEBU
2.5000 mg | INHALATION_SOLUTION | Freq: Once | RESPIRATORY_TRACT | Status: AC
  Administered 2016-06-05: 2.5 mg via RESPIRATORY_TRACT

## 2016-06-06 ENCOUNTER — Emergency Department (HOSPITAL_COMMUNITY)
Admission: EM | Admit: 2016-06-06 | Discharge: 2016-06-06 | Disposition: A | Discharge status: Home / Self Care | Payer: PPO | Attending: Physician Assistant | Admitting: Physician Assistant

## 2016-06-06 ENCOUNTER — Ambulatory Visit (HOSPITAL_COMMUNITY)
Admission: RE | Admit: 2016-06-06 | Discharge: 2016-06-06 | Disposition: A | Discharge status: Home / Self Care | Payer: PPO | Source: Ambulatory Visit | Attending: Cardiothoracic Surgery | Admitting: Cardiothoracic Surgery

## 2016-06-06 ENCOUNTER — Encounter (HOSPITAL_COMMUNITY): Payer: Self-pay

## 2016-06-06 ENCOUNTER — Encounter (HOSPITAL_COMMUNITY)
Admission: RE | Admit: 2016-06-06 | Discharge: 2016-06-06 | Disposition: A | Discharge status: Home / Self Care | Payer: PPO | Source: Ambulatory Visit | Attending: Cardiothoracic Surgery | Admitting: Cardiothoracic Surgery

## 2016-06-06 DIAGNOSIS — Z7982 Long term (current) use of aspirin: Secondary | ICD-10-CM | POA: Insufficient documentation

## 2016-06-06 DIAGNOSIS — Z8673 Personal history of transient ischemic attack (TIA), and cerebral infarction without residual deficits: Secondary | ICD-10-CM | POA: Diagnosis not present

## 2016-06-06 DIAGNOSIS — I2510 Atherosclerotic heart disease of native coronary artery without angina pectoris: Secondary | ICD-10-CM | POA: Insufficient documentation

## 2016-06-06 DIAGNOSIS — R739 Hyperglycemia, unspecified: Secondary | ICD-10-CM

## 2016-06-06 DIAGNOSIS — I7789 Other specified disorders of arteries and arterioles: Secondary | ICD-10-CM | POA: Insufficient documentation

## 2016-06-06 DIAGNOSIS — I10 Essential (primary) hypertension: Secondary | ICD-10-CM | POA: Diagnosis not present

## 2016-06-06 DIAGNOSIS — E1165 Type 2 diabetes mellitus with hyperglycemia: Secondary | ICD-10-CM | POA: Diagnosis not present

## 2016-06-06 DIAGNOSIS — Z01818 Encounter for other preprocedural examination: Secondary | ICD-10-CM | POA: Insufficient documentation

## 2016-06-06 DIAGNOSIS — Z87891 Personal history of nicotine dependence: Secondary | ICD-10-CM | POA: Insufficient documentation

## 2016-06-06 DIAGNOSIS — Z79899 Other long term (current) drug therapy: Secondary | ICD-10-CM | POA: Diagnosis not present

## 2016-06-06 DIAGNOSIS — Z7984 Long term (current) use of oral hypoglycemic drugs: Secondary | ICD-10-CM | POA: Diagnosis not present

## 2016-06-06 DIAGNOSIS — I6521 Occlusion and stenosis of right carotid artery: Secondary | ICD-10-CM | POA: Insufficient documentation

## 2016-06-06 HISTORY — DX: Atherosclerotic heart disease of native coronary artery without angina pectoris: I25.10

## 2016-06-06 HISTORY — DX: Gastro-esophageal reflux disease without esophagitis: K21.9

## 2016-06-06 HISTORY — DX: Headache, unspecified: R51.9

## 2016-06-06 HISTORY — DX: Headache: R51

## 2016-06-06 LAB — CBC WITH DIFFERENTIAL/PLATELET
Basophils Absolute: 0 10*3/uL (ref 0.0–0.1)
Basophils Relative: 0 %
Eosinophils Absolute: 0.1 10*3/uL (ref 0.0–0.7)
Eosinophils Relative: 1 %
HCT: 34.5 % — ABNORMAL LOW (ref 39.0–52.0)
Hemoglobin: 12.4 g/dL — ABNORMAL LOW (ref 13.0–17.0)
Lymphocytes Relative: 18 %
Lymphs Abs: 1.9 10*3/uL (ref 0.7–4.0)
MCH: 29.8 pg (ref 26.0–34.0)
MCHC: 35.9 g/dL (ref 30.0–36.0)
MCV: 82.9 fL (ref 78.0–100.0)
Monocytes Absolute: 0.6 10*3/uL (ref 0.1–1.0)
Monocytes Relative: 5 %
Neutro Abs: 7.8 10*3/uL — ABNORMAL HIGH (ref 1.7–7.7)
Neutrophils Relative %: 76 %
Platelets: 210 10*3/uL (ref 150–400)
RBC: 4.16 MIL/uL — ABNORMAL LOW (ref 4.22–5.81)
RDW: 14 % (ref 11.5–15.5)
WBC: 10.3 10*3/uL (ref 4.0–10.5)

## 2016-06-06 LAB — COMPREHENSIVE METABOLIC PANEL
ALT: 21 U/L (ref 17–63)
ALT: 23 U/L (ref 17–63)
AST: 15 U/L (ref 15–41)
AST: 20 U/L (ref 15–41)
Albumin: 3.8 g/dL (ref 3.5–5.0)
Albumin: 3.6 g/dL (ref 3.5–5.0)
Alkaline Phosphatase: 124 U/L (ref 38–126)
Alkaline Phosphatase: 129 U/L — ABNORMAL HIGH (ref 38–126)
Anion gap: 14 (ref 5–15)
Anion gap: 13 (ref 5–15)
BUN: 24 mg/dL — ABNORMAL HIGH (ref 6–20)
BUN: 24 mg/dL — ABNORMAL HIGH (ref 6–20)
CO2: 22 mmol/L (ref 22–32)
CO2: 23 mmol/L (ref 22–32)
Calcium: 9 mg/dL (ref 8.9–10.3)
Calcium: 8.8 mg/dL — ABNORMAL LOW (ref 8.9–10.3)
Chloride: 92 mmol/L — ABNORMAL LOW (ref 101–111)
Chloride: 92 mmol/L — ABNORMAL LOW (ref 101–111)
Creatinine, Ser: 1.28 mg/dL — ABNORMAL HIGH (ref 0.61–1.24)
Creatinine, Ser: 1.37 mg/dL — ABNORMAL HIGH (ref 0.61–1.24)
GFR calc Af Amer: >60 mL/min (ref >60)
GFR calc Af Amer: >60 mL/min (ref >60)
GFR calc non Af Amer: 57 mL/min — ABNORMAL LOW (ref >60)
GFR calc non Af Amer: 53 mL/min — ABNORMAL LOW (ref >60)
Glucose, Bld: 536 mg/dL (ref 65–99)
Glucose, Bld: 492 mg/dL — ABNORMAL HIGH (ref 65–99)
Potassium: 4.5 mmol/L (ref 3.5–5.1)
Potassium: 4.1 mmol/L (ref 3.5–5.1)
Sodium: 128 mmol/L — ABNORMAL LOW (ref 135–145)
Sodium: 128 mmol/L — ABNORMAL LOW (ref 135–145)
Total Bilirubin: 1 mg/dL (ref 0.3–1.2)
Total Bilirubin: 1 mg/dL (ref 0.3–1.2)
Total Protein: 6.4 g/dL — ABNORMAL LOW (ref 6.5–8.1)
Total Protein: 6.4 g/dL — ABNORMAL LOW (ref 6.5–8.1)

## 2016-06-06 LAB — URINALYSIS, ROUTINE W REFLEX MICROSCOPIC
Bacteria, UA: NONE SEEN
Bilirubin Urine: NEGATIVE
Glucose, UA: >=500 mg/dL — AB
Hgb urine dipstick: NEGATIVE
Ketones, ur: 5 mg/dL — AB
Leukocytes, UA: NEGATIVE
Nitrite: NEGATIVE
Protein, ur: NEGATIVE mg/dL
Specific Gravity, Urine: 1.028 (ref 1.005–1.030)
pH: 6 (ref 5.0–8.0)

## 2016-06-06 LAB — PROTIME-INR
INR: 1
Prothrombin Time: 13.2 seconds (ref 11.4–15.2)

## 2016-06-06 LAB — CBC
HCT: 35.7 % — ABNORMAL LOW (ref 39.0–52.0)
Hemoglobin: 12.8 g/dL — ABNORMAL LOW (ref 13.0–17.0)
MCH: 29.8 pg (ref 26.0–34.0)
MCHC: 35.9 g/dL (ref 30.0–36.0)
MCV: 83 fL (ref 78.0–100.0)
Platelets: 189 10*3/uL (ref 150–400)
RBC: 4.3 MIL/uL (ref 4.22–5.81)
RDW: 14.2 % (ref 11.5–15.5)
WBC: 12.1 10*3/uL — ABNORMAL HIGH (ref 4.0–10.5)

## 2016-06-06 LAB — BLOOD GAS, ARTERIAL
Acid-Base Excess: 0.7 mmol/L (ref 0.0–2.0)
Bicarbonate: 24.7 mmol/L (ref 20.0–28.0)
Drawn by: 421801
FIO2: 21
O2 Saturation: 96.6 %
Patient temperature: 98.6
pCO2 arterial: 38.8 mmHg (ref 32.0–48.0)
pH, Arterial: 7.42 (ref 7.350–7.450)
pO2, Arterial: 85.1 mmHg (ref 83.0–108.0)

## 2016-06-06 LAB — GLUCOSE, CAPILLARY: Glucose-Capillary: 519 mg/dL (ref 65–99)

## 2016-06-06 LAB — SURGICAL PCR SCREEN
MRSA, PCR: NEGATIVE
Staphylococcus aureus: NEGATIVE

## 2016-06-06 LAB — APTT: aPTT: 27 seconds (ref 24–36)

## 2016-06-06 LAB — ABO/RH: ABO/RH(D): A POS

## 2016-06-06 LAB — CBG MONITORING, ED: Glucose-Capillary: 426 mg/dL — ABNORMAL HIGH (ref 65–99)

## 2016-06-06 MED ORDER — SODIUM CHLORIDE 0.9 % IV BOLUS (SEPSIS)
1000.0000 mL | Freq: Once | INTRAVENOUS | Status: AC
  Administered 2016-06-06: 1000 mL via INTRAVENOUS

## 2016-06-06 NOTE — Progress Notes (Addendum)
PCP: [facility] in [place], [place]  Cardiologist: Dr. Einar Gip  Pt. States he was dx. Diabetes 03-29-16 during a E.D. Visit at [facility]. Was put on medication. He does not check blood sugars at home and doesn't have a machine. Notified Myra Gianotti, PA.  LAb called reported glucose  536. Reported results to [facility], [place]. Stated she will inform pt's. PCP.

## 2016-06-06 NOTE — ED Notes (Signed)
Pt updated on awaiting call back from primary doctor, patient understands, dr. Thomasene Lot repaged provider

## 2016-06-06 NOTE — Discharge Instructions (Signed)
Please go directly to your primary care physician's office. They will help you by starting on a medication for high blood sugars. In addition please call Thurmond Butts from your cardiothoracic surgeon's office this afternoon to touch base with you having additional imaging tomorrow.

## 2016-06-06 NOTE — ED Provider Notes (Addendum)
[facility] DEPT Provider Note   CSN: [id number] Arrival date & time: 06/06/16  1117     History   Chief Complaint No chief complaint on file.   HPI ROI Drew Harrison is a 66 y.o. male.  HPI   Patient was his 66 year old male presenting with high blood sugar. Patient had outpatient labs noting an elevated blood sugar. Patient has no evidence of DKA. The PA at the outpatient lab called his primary care physician who told him to come here to the emergency department for evaluation.  Patient has had mild fatigue for the last several weeks. Patient had heart cath showing need for CABG. He  Planning to have CABG on Monday.  Past Medical History:  Diagnosis Date  . Coronary artery disease   . Diabetes mellitus without complication ([place])   . GERD (gastroesophageal reflux disease)   . Gout   . Headache   . Hyperlipidemia   . Hypertension   . Stroke ([place]) 03/29/2016   left facial droop    Patient Active Problem List   Diagnosis Date Noted  . 3-vessel CAD 06/04/2016  . Abnormal nuclear stress test 04/08/2016  . Cerebrovascular accident (CVA) ([place]) 03/29/2016  . Ischemic stroke ([place]) 03/29/2016  . Essential hypertension   . H/O tobacco use, presenting hazards to health     Past Surgical History:  Procedure Laterality Date  . LEFT HEART CATH AND CORONARY ANGIOGRAPHY N/A 05/14/2016   Procedure: Left Heart Cath and Coronary Angiography;  Surgeon: Adrian Prows, MD;  Location: [facility];  Service: Cardiovascular;  Laterality: N/A;  . TONSILLECTOMY         Home Medications    Prior to Admission medications   Medication Sig Start Date End Date Taking? Authorizing Provider  allopurinol (ZYLOPRIM) 100 MG tablet Take 1 tablet by mouth 2 (two) times daily.  02/06/16   Historical Provider, MD  aspirin 81 MG tablet Take 81 mg by mouth daily.     Historical Provider, MD  atorvastatin (LIPITOR) 40 MG tablet Take 1 tablet (40 mg total) by mouth daily at 6 PM. 03/30/16   [place]  Tanna Furry, MD  Carboxymethylcellul-Glycerin 0.5-0.9 % SOLN Apply 1 drop to eye 3 (three) times daily as needed. Patient not taking: Reported on 06/06/2016 04/12/16   Isla Pence, MD  clopidogrel (PLAVIX) 75 MG tablet Take 75 mg by mouth daily.    Historical Provider, MD  famotidine (PEPCID) 40 MG tablet Take 40 mg by mouth 2 (two) times daily.    Historical Provider, MD  metFORMIN (GLUCOPHAGE) 500 MG tablet Take 1 tablet (500 mg total) by mouth 2 (two) times daily with a meal. 05/16/16   Adrian Prows, MD  metoprolol (LOPRESSOR) 50 MG tablet Take 50 mg by mouth 2 (two) times daily.  04/02/16   Historical Provider, MD  naproxen sodium (ANAPROX) 220 MG tablet Take 220 mg by mouth daily as needed (pain).    Historical Provider, MD  valsartan-hydrochlorothiazide (DIOVAN-HCT) 320-12.5 MG tablet Take 1 tablet by mouth 2 (two) times daily. 04/29/16   Historical Provider, MD    Family History History reviewed. No pertinent family history.  Social History Social History  Substance Use Topics  . Smoking status: Former Smoker    Packs/day: 1.50    Years: 48.00    Types: Cigarettes    Quit date: 05/07/2016  . Smokeless tobacco: Never Used  . Alcohol use Yes     Comment: couple drinks per week     Allergies  Patient has no known allergies.   Review of Systems Review of Systems  Constitutional: Positive for fatigue.  Respiratory: Negative for cough and shortness of breath.   Cardiovascular: Negative for chest pain.  Gastrointestinal: Negative for abdominal pain.  All other systems reviewed and are negative.    Physical Exam Updated Vital Signs BP 125/60   Pulse 84   Temp 97.7 F (36.5 C) (Oral)   Resp 18   SpO2 100%   Physical Exam  Constitutional: He is oriented to person, place, and time. He appears well-nourished.  HENT:  Head: Normocephalic.  Eyes: Conjunctivae are normal.  Cardiovascular: Normal rate and regular rhythm.   No murmur heard. Pulmonary/Chest: Effort normal  and breath sounds normal. No respiratory distress.  Neurological: He is oriented to person, place, and time.  Skin: Skin is warm and dry. He is not diaphoretic.  Psychiatric: He has a normal mood and affect. His behavior is normal.     ED Treatments / Results  Labs (all labs ordered are listed, but only abnormal results are displayed) Labs Reviewed  CBC WITH DIFFERENTIAL/PLATELET - Abnormal; Notable for the following:       Result Value   RBC 4.16 (*)    Hemoglobin 12.4 (*)    HCT 34.5 (*)    Neutro Abs 7.8 (*)    All other components within normal limits  COMPREHENSIVE METABOLIC PANEL - Abnormal; Notable for the following:    Sodium 128 (*)    Chloride 92 (*)    Glucose, Bld 492 (*)    BUN 24 (*)    Creatinine, Ser 1.37 (*)    Calcium 8.8 (*)    Total Protein 6.4 (*)    Alkaline Phosphatase 129 (*)    GFR calc non Af Amer 53 (*)    All other components within normal limits    EKG  EKG Interpretation None       Radiology Dg Chest 2 View  Result Date: 06/06/2016 CLINICAL DATA:  Coronary artery disease . EXAM: CHEST  2 VIEW COMPARISON:  No recent. FINDINGS: Mediastinum and hilar structures are normal. Lungs are clear. No focal infiltrate. No pleural effusion or pneumothorax. Degenerative changes thoracic spine . IMPRESSION: No acute cardiopulmonary disease. Electronically Signed   By: Marcello Moores  Register   On: 06/06/2016 09:11    Procedures Procedures (including critical care time)  Medications Ordered in ED Medications  sodium chloride 0.9 % bolus 1,000 mL (1,000 mLs Intravenous New Bag/Given 06/06/16 1210)     Initial Impression / Assessment and Plan / ED Course  I have reviewed the triage vital signs and the nursing notes.  Pertinent labs & imaging results that were available during my care of the patient were reviewed by me and considered in my medical decision making (see chart for details).     Patient is well-appearing 66 year old male with no complaint.  Patient is isolated hyperglycemia. Discussed with patient's primary care doctor Redmond Pulling. He reported that he sent to the emergency department in order to see whether cardiothoracic wanted to admit to get his blood sugars under control before his planned surgery in Monday.  Pt does not have DKA, does not require admission except that CT surgs is planning surgery on mondya and has specific blood sugar goals.   Discussed with Dr. Redmond Pulling who sent him here only to be evaluated to see whether CT surgery wanted to admit him.  12:42 PM Just discussed with PA on for Dr. Charlaine Dalton who will consult with  him about whether they want admission to their service for blood sugar control beffor Monday.   2:06 PM Call Dr. Catha Nottingham PA again. They're unable to talk with him because he is in  surgery. Dr. Loreli Slot colleague said to send the patient home.  Touched base at Dr. Dois Davenport office again and we'll have him go to see his primary care to try to get blood sugar control as an outpatient.  2:24 PM Dr. Dois Davenport office will see him right away. We'll discharge Dr. Dois Davenport office for further care of his hyperglycemia.  Patient is comfortable, ambulatory, and taking PO at time of discharge.  Patient expressed understanding about return precautions.     Final Clinical Impressions(s) / ED Diagnoses   Final diagnoses:  None    New Prescriptions New Prescriptions   No medications on file     Tharun Cappella Julio Alm, MD 06/06/16 [place], MD 06/06/16 [place], MD 06/06/16 1424

## 2016-06-06 NOTE — ED Notes (Signed)
Pt's CBG result was [phone]. Informed Courtney - RN.

## 2016-06-06 NOTE — ED Notes (Signed)
ED Provider at bedside. 

## 2016-06-06 NOTE — Pre-Procedure Instructions (Addendum)
Drew Harrison  06/06/2016      [facility] [place] [facility] [place] [address] [place] [address] Phone: [phone] Fax: [phone]    Your procedure is scheduled on   Monday  06/10/16  Report to [facility] Admitting at 530 A.M.  Call this number if you have problems the morning of surgery:  [phone]   Remember:  Do not eat food or drink liquids after midnight.  Take these medicines the morning of surgery with A SIP OF WATER : allopurinol (zyloprim), famotidine (pepcid),metoprolol  (lopressor)             Continue aspirin per Dr. Prescott Gum           Stop naproxen sodium, advil, motrin, ibuprofen, BC Powders, Goody's.                   How to Manage Your Diabetes Before and After Surgery  Why is it important to control my blood sugar before and after surgery? . Improving blood sugar levels before and after surgery helps healing and can limit problems. . A way of improving blood sugar control is eating a healthy diet by: o  Eating less sugar and carbohydrates o  Increasing activity/exercise o  Talking with your doctor about reaching your blood sugar goals . High blood sugars (greater than 180 mg/dL) can raise your risk of infections and slow your recovery, so you will need to focus on controlling your diabetes during the weeks before surgery. . Make sure that the doctor who takes care of your diabetes knows about your planned surgery including the date and location.  How do I manage my blood sugar before surgery? . Check your blood sugar at least 4 times a day, starting 2 days before surgery, to make sure that the level is not too high or low. o Check your blood sugar the morning of your surgery when you wake up and every 2 hours until you get to the Short Stay unit. . If your blood sugar is less than 70 mg/dL, you will need to treat for low blood sugar: o Do not take insulin. o Treat a low blood sugar  (less than 70 mg/dL) with  cup of clear juice (cranberry or apple), 4 glucose tablets, OR glucose gel. o Recheck blood sugar in 15 minutes after treatment (to make sure it is greater than 70 mg/dL). If your blood sugar is not greater than 70 mg/dL on recheck, call [phone] for further instructions. . Report your blood sugar to the short stay nurse when you get to Short Stay.  . If you are admitted to the hospital after surgery: o Your blood sugar will be checked by the staff and you will probably be given insulin after surgery (instead of oral diabetes medicines) to make sure you have good blood sugar levels. o The goal for blood sugar control after surgery is 80-180 mg/dL.         WHAT DO I DO ABOUT MY DIABETES MEDICATION?   Marland Kitchen Do not take oral diabetes medicines (pills) the morning of surgery.   Do not wear jewelry.  Do not wear lotions, powders, or perfumes, or deoderant.  Do not shave 48 hours prior to surgery.  Men may shave face and neck.  Do not bring valuables to the hospital.  [facility] is not responsible for any belongings or valuables.  Contacts, dentures or bridgework may not be worn  into surgery.  Leave your suitcase in the car.  After surgery it may be brought to your room.  For patients admitted to the hospital, discharge time will be determined by your treatment team.  Patients discharged the day of surgery will not be allowed to drive home.   Name and phone number of your driver:    Special instructions:  [place] - Preparing for Surgery  Before surgery, you can play an important role.  Because skin is not sterile, your skin needs to be as free of germs as possible.  You can reduce the number of germs on you skin by washing with CHG (chlorahexidine gluconate) soap before surgery.  CHG is an antiseptic cleaner which kills germs and bonds with the skin to continue killing germs even after washing.  Please DO NOT use if you have an allergy to CHG or  antibacterial soaps.  If your skin becomes reddened/irritated stop using the CHG and inform your nurse when you arrive at Short Stay.  Do not shave (including legs and underarms) for at least 48 hours prior to the first CHG shower.  You may shave your face.  Please follow these instructions carefully:   1.  Shower with CHG Soap the night before surgery and the                                morning of Surgery.  2.  If you choose to wash your hair, wash your hair first as usual with your       normal shampoo.  3.  After you shampoo, rinse your hair and body thoroughly to remove the                      Shampoo.  4.  Use CHG as you would any other liquid soap.  You can apply chg directly       to the skin and wash gently with scrungie or a clean washcloth.  5.  Apply the CHG Soap to your body ONLY FROM THE NECK DOWN.        Do not use on open wounds or open sores.  Avoid contact with your eyes,       ears, mouth and genitals (private parts).  Wash genitals (private parts)       with your normal soap.  6.  Wash thoroughly, paying special attention to the area where your surgery        will be performed.  7.  Thoroughly rinse your body with warm water from the neck down.  8.  DO NOT shower/wash with your normal soap after using and rinsing off       the CHG Soap.  9.  Pat yourself dry with a clean towel.            10.  Wear clean pajamas.            11.  Place clean sheets on your bed the night of your first shower and do not        sleep with pets.  Day of Surgery  Do not apply any lotions/deoderants the morning of surgery.  Please wear clean clothes to the hospital/surgery center.    Please read over the following fact sheets that you were given. Coughing and Deep Breathing, Open Heart Packet, MRSA Information and Surgical Site Infection Prevention

## 2016-06-06 NOTE — ED Notes (Signed)
Pt assisted up to chair for comfort

## 2016-06-06 NOTE — Progress Notes (Addendum)
Anesthesia Note: Patient is a 66 year old male scheduled for CABG on 06/10/16 by Dr. Prescott Gum.   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 He reports that he has been trying to quit drinking ETOH.   Patient had an abnormal echo during evaluation for 03/2016 CVA. This lead to an stress test which was high risk and was followed by cath that showed severe 3V CAD. He was referred to [place] surgeon Dr. Prescott Gum. CABG was recommended. Patient had recovered from his CVA > 2 months ago, so Dr. Prescott Gum recommended proceeding with CABG and holding Plavix prior to surgery. He is staying on ASA. During CVA admission, he was also noted to have an elevated A1c of 6.9 and was started on metformin.   PCP is Dr. Josph Macho Wilson/Jennifer Couillard, PA-C at [facility] (see Care Everywhere). Cardiologist is Dr. Adrian Prows.  Meds include allopurinol, aspirin 81 mg, Lipitor, Plavix, Pepcid, metformin (stopped 06/06/16), Lopressor, Diovan-HCT. Basaglar started 06/06/16.  BP (!) 104/56   Pulse 74   Temp 36.6 C   Resp 18   Ht 6' (1.829 m)   Wt 188 lb 3.2 oz (85.4 kg)   SpO2 97%   BMI 25.52 kg/m  CBG 519. Patient reported having coffee with a tsp of sugar this morning. Feels in his usual state. No abdominal pain or N/V. No new chest pain or SOB. No excessive thirstiness. Some frequent urination, but had been more so when on a diuretic. I contacted [place] regarding potentially sending patient to the ED, but patient also with dopplers scheduled after PAT. CMET was pending at the time, but patient did not appear ill-appearing. She discussed with covering surgeon Dr. Servando Snare who recommended that patient continue with testing as planned but to be seen by his PCP today. I called and got patient an appointment with Dewayne Shorter, PA-C at 1:00 PM. However, once his labs were back  I did call to give her an update. She spoke with Dr. Redmond Pulling who recommended that patient go to the ED for management of his hyperglycemia. They thought he may require IV insulin. I called and let patient know just after I spoke with Anderson Malta and contacted the ED first nurse just before 11:00 AM. If following up ED notes, he was given IVF and sent back to his PCP office for further recommendations. He was seen by Dewayne Shorter, PA-C this afternoon. His metformin was stopped and he was started on Basaglar 10 Units Q HS with instructions to increase by 1 unite for fasting > 150 and decrease for fasting < 80.   EKG 05/14/16: NSR, possible LAE, LVH with repolarization abnormality, inferior infarct (age undetermined), prolonged QT.  Coronary angiography 05/14/2016: LV: Normal LVEF, 50-55%. No wall motion abnormality. Coronary arteries: Severe triple-vessel coronary artery disease. Moderately calcified coronary vessels. Right coronary artery: Dominant, proximal 80%, mid 90% followed by occlusion after a small RV branch. Large PDA branch and small PL branch faintly visualized by contralateral collaterals. Left main: Mild coronary calcification. Distal left main shows 10-20% stenosis. 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 There is tandem high-grade 90% stenosis in the proximal LAD and just after the origin of a large D1. Diagonal 1 proximal segment has 80% stenosis. Mid to distal LAD after the aneurysm formation  is smooth and very large caliber. Circumflex coronary artery: It is occluded in the proximal segment, gives origin to 2 marginals which are free by collaterals and appeared to be bypassable. Ramus intermediate: There are 2 ramus intermediate branches, first branch has mild disease in the proximal end, second branch which is inferior branch has a high-grade 80% proximal stenosis in the proximal to mid segment. Mid to distal  segment of the vessel is are smooth and fairly large size. Left subclavian and LIMA: Widely patent. Right subclavian and RIMA widely patent. Recommendation: Patient will need elective CABG. He'll be discharged home today with outpatient follow-up.  Echo 03/30/16: Study Conclusions - Left ventricle: The cavity size was normal. Wall thickness was   normal. Systolic function was normal. The estimated ejection   fraction was in the range of 50% to 55%. Probable severe   hypokinesis and scarring of the basalinferolateral and inferior   myocardium; in the distribution of the right coronary or left   circumflex coronary artery. Features are consistent with a   pseudonormal left ventricular filling pattern, with concomitant   abnormal relaxation and increased filling pressure (grade 2   diastolic dysfunction). - Mitral valve: There was mild to moderate regurgitation. - Left atrium: The atrium was mildly dilated.  Carotid Duplex 06/04/16: Right 50-69% ICA stenosis and Left ICA no stenosis.  Labs from 06/06/16 0847 and 1129 noted. Last Na 128, K 4.1, Cr 1.37, glucose 492, H/H 12.4/34.5. PT/PTT WNL. A1c in process, but was 6.9 on 03/29/16.  Dr. Prescott Gum to review events. Timing of surgery could depend on DM control over the next few days. If still poorly controlled then may have to consider early admission. Will plan to follow-up CT surgery recommendations.  [facility] Short Stay Center/Anesthesiology Phone [phone] 06/06/2016 7:02 PM  Addendum: A1c came back at 13.6 (mean plasma glucose 344), which is nearly double since 03/29/16. I routed to [employer]. Dr. Prescott Gum had planned on proceeding with patient's blood glucose levels were < 300 over the weekend. His staff will follow-up with patient. It looks like patient has a chest CT with constrast ordered (to look for aortic calcifications; see below) for this afternoon. I will enter a repeat STAT BMET for the day of surgery to  reevaluate for hyponatremia, hyperglycemia, and renal function (Cr 1.10-1.28-1.37 since 03/29/16).   Chest CT 06/07/16: IMPRESSION: Extensive aortic atherosclerosis. Extensive coronary artery calcification. No aneurysm formation or aortic dissection. No acute abnormalities.  [facility] Short Stay Center/Anesthesiology Phone [phone] 06/07/2016 2:13 PM

## 2016-06-06 NOTE — Progress Notes (Signed)
Pre-op Cardiac Surgery  Carotid Findings:  Carotid duplex performed 06/04/16  Upper Extremity Right Left  Brachial Pressures 132 121  Radial Waveforms Tri Tri  Ulnar Waveforms Tri Tri  Palmar Arch (Allen's Test) Decreases >50% with radial compression, normal with ulnar compression Obliterates with radial compression, normal with ulnar compression   Findings:      Lower  Extremity Right Left      Anterior Tibial 83, bi 75, mono  Posterior Tibial 91, bi 74, mono  Ankle/Brachial Indices 0.69 0.57    Findings:  Bilateral moderate arterial disease.  Landry Mellow, RDMS, RVT 06/06/2016

## 2016-06-06 NOTE — ED Notes (Signed)
Pt has no complaints other than dry mouth

## 2016-06-06 NOTE — ED Triage Notes (Signed)
Pt presents with CBG of 500 while getting pre-op labs done this morning. Pt scheduled for CABG on Monday.

## 2016-06-07 ENCOUNTER — Ambulatory Visit (HOSPITAL_COMMUNITY)
Admission: RE | Admit: 2016-06-07 | Discharge: 2016-06-07 | Disposition: A | Discharge status: Home / Self Care | Payer: PPO | Source: Ambulatory Visit | Attending: Cardiothoracic Surgery | Admitting: Cardiothoracic Surgery

## 2016-06-07 ENCOUNTER — Other Ambulatory Visit: Payer: Self-pay | Admitting: *Deleted

## 2016-06-07 DIAGNOSIS — Z01818 Encounter for other preprocedural examination: Secondary | ICD-10-CM | POA: Diagnosis not present

## 2016-06-07 DIAGNOSIS — I700 Atherosclerosis of aorta: Secondary | ICD-10-CM | POA: Diagnosis not present

## 2016-06-07 DIAGNOSIS — I2510 Atherosclerotic heart disease of native coronary artery without angina pectoris: Secondary | ICD-10-CM | POA: Diagnosis not present

## 2016-06-07 DIAGNOSIS — R918 Other nonspecific abnormal finding of lung field: Secondary | ICD-10-CM | POA: Diagnosis not present

## 2016-06-07 DIAGNOSIS — E119 Type 2 diabetes mellitus without complications: Secondary | ICD-10-CM | POA: Diagnosis not present

## 2016-06-07 LAB — HEMOGLOBIN A1C
Hgb A1c MFr Bld: 13.6 % — ABNORMAL HIGH (ref 4.8–5.6)
Mean Plasma Glucose: 344 mg/dL

## 2016-06-07 MED ORDER — IOPAMIDOL (ISOVUE-300) INJECTION 61%
INTRAVENOUS | Status: AC
  Administered 2016-06-07: 75 mL
  Filled 2016-06-07: qty 75

## 2016-06-11 NOTE — Progress Notes (Addendum)
Anesthesia follow-up: See my anesthesia note from 06/06/16 with 06/07/16 addendum. Currently, patient's CABG has been rescheduled for 06/17/16. I spoke with Dr. Prescott Gum this afternoon. He would like patient to come in on Friday 06/14/16 for repeat labs (entered by TCTS as CBC, BMET, T&S). Our PAT scheduler will contact patient to set up a time.   [facility] Short Stay Center/Anesthesiology Phone [phone] 06/11/2016 2:04 PM  Addendum: Patient had repeat labs this morning. Fasting glucose readings are still > 400, but last week were over 500. Patient is still increasing Basaglar three units daily if fasting > 200. I spoke with Harrison Mons at [facility] this afternoon. She notified Dr. Prescott Gum of today's lab results and most recent home glucose control. For now, patient will remain as scheduled. He will get fasting CBG on arrival. Definitive plan at that time. If glucose still significantly elevated and risk of postponing CABG felt too great then would likely need to start IV insulin gtt while still in Holding. Plan per Dr. Prescott Gum and assigned anesthesiologist following their evaluation Monday.    [facility] Short Stay Center/Anesthesiology Phone [phone] 06/14/2016 5:23 PM

## 2016-06-14 ENCOUNTER — Encounter (HOSPITAL_COMMUNITY)
Admission: RE | Admit: 2016-06-14 | Discharge: 2016-06-14 | Disposition: A | Discharge status: Home / Self Care | Payer: PPO | Source: Ambulatory Visit | Attending: Cardiothoracic Surgery | Admitting: Cardiothoracic Surgery

## 2016-06-14 DIAGNOSIS — K219 Gastro-esophageal reflux disease without esophagitis: Secondary | ICD-10-CM | POA: Diagnosis not present

## 2016-06-14 DIAGNOSIS — R51 Headache: Secondary | ICD-10-CM | POA: Diagnosis not present

## 2016-06-14 DIAGNOSIS — I499 Cardiac arrhythmia, unspecified: Secondary | ICD-10-CM | POA: Diagnosis not present

## 2016-06-14 DIAGNOSIS — Z87891 Personal history of nicotine dependence: Secondary | ICD-10-CM | POA: Diagnosis not present

## 2016-06-14 DIAGNOSIS — H53462 Homonymous bilateral field defects, left side: Secondary | ICD-10-CM | POA: Diagnosis not present

## 2016-06-14 DIAGNOSIS — J9690 Respiratory failure, unspecified, unspecified whether with hypoxia or hypercapnia: Secondary | ICD-10-CM | POA: Diagnosis not present

## 2016-06-14 DIAGNOSIS — R278 Other lack of coordination: Secondary | ICD-10-CM | POA: Diagnosis not present

## 2016-06-14 DIAGNOSIS — D62 Acute posthemorrhagic anemia: Secondary | ICD-10-CM | POA: Diagnosis not present

## 2016-06-14 DIAGNOSIS — I255 Ischemic cardiomyopathy: Secondary | ICD-10-CM | POA: Diagnosis not present

## 2016-06-14 DIAGNOSIS — R2689 Other abnormalities of gait and mobility: Secondary | ICD-10-CM | POA: Diagnosis not present

## 2016-06-14 DIAGNOSIS — I509 Heart failure, unspecified: Secondary | ICD-10-CM | POA: Diagnosis not present

## 2016-06-14 DIAGNOSIS — R0602 Shortness of breath: Secondary | ICD-10-CM | POA: Diagnosis not present

## 2016-06-14 DIAGNOSIS — D689 Coagulation defect, unspecified: Secondary | ICD-10-CM | POA: Diagnosis not present

## 2016-06-14 DIAGNOSIS — Z951 Presence of aortocoronary bypass graft: Secondary | ICD-10-CM | POA: Diagnosis not present

## 2016-06-14 DIAGNOSIS — J9811 Atelectasis: Secondary | ICD-10-CM | POA: Diagnosis not present

## 2016-06-14 DIAGNOSIS — E785 Hyperlipidemia, unspecified: Secondary | ICD-10-CM | POA: Diagnosis not present

## 2016-06-14 DIAGNOSIS — R079 Chest pain, unspecified: Secondary | ICD-10-CM | POA: Diagnosis not present

## 2016-06-14 DIAGNOSIS — I2510 Atherosclerotic heart disease of native coronary artery without angina pectoris: Secondary | ICD-10-CM | POA: Insufficient documentation

## 2016-06-14 DIAGNOSIS — J90 Pleural effusion, not elsewhere classified: Secondary | ICD-10-CM | POA: Diagnosis not present

## 2016-06-14 DIAGNOSIS — I97820 Postprocedural cerebrovascular infarction during cardiac surgery: Secondary | ICD-10-CM | POA: Diagnosis not present

## 2016-06-14 DIAGNOSIS — Z8673 Personal history of transient ischemic attack (TIA), and cerebral infarction without residual deficits: Secondary | ICD-10-CM | POA: Diagnosis not present

## 2016-06-14 DIAGNOSIS — H538 Other visual disturbances: Secondary | ICD-10-CM | POA: Diagnosis not present

## 2016-06-14 DIAGNOSIS — Z4682 Encounter for fitting and adjustment of non-vascular catheter: Secondary | ICD-10-CM | POA: Diagnosis not present

## 2016-06-14 DIAGNOSIS — I110 Hypertensive heart disease with heart failure: Secondary | ICD-10-CM | POA: Diagnosis not present

## 2016-06-14 DIAGNOSIS — I700 Atherosclerosis of aorta: Secondary | ICD-10-CM | POA: Diagnosis not present

## 2016-06-14 DIAGNOSIS — I639 Cerebral infarction, unspecified: Secondary | ICD-10-CM | POA: Diagnosis not present

## 2016-06-14 DIAGNOSIS — E1165 Type 2 diabetes mellitus with hyperglycemia: Secondary | ICD-10-CM | POA: Diagnosis not present

## 2016-06-14 DIAGNOSIS — I25110 Atherosclerotic heart disease of native coronary artery with unstable angina pectoris: Secondary | ICD-10-CM | POA: Diagnosis not present

## 2016-06-14 DIAGNOSIS — E119 Type 2 diabetes mellitus without complications: Secondary | ICD-10-CM | POA: Diagnosis not present

## 2016-06-14 DIAGNOSIS — M109 Gout, unspecified: Secondary | ICD-10-CM | POA: Diagnosis not present

## 2016-06-14 DIAGNOSIS — Z794 Long term (current) use of insulin: Secondary | ICD-10-CM | POA: Diagnosis not present

## 2016-06-14 LAB — BASIC METABOLIC PANEL
Anion gap: 10 (ref 5–15)
BUN: 17 mg/dL (ref 6–20)
CO2: 26 mmol/L (ref 22–32)
Calcium: 9.8 mg/dL (ref 8.9–10.3)
Chloride: 95 mmol/L — ABNORMAL LOW (ref 101–111)
Creatinine, Ser: 1.1 mg/dL (ref 0.61–1.24)
GFR calc Af Amer: >60 mL/min (ref >60)
GFR calc non Af Amer: >60 mL/min (ref >60)
Glucose, Bld: 482 mg/dL — ABNORMAL HIGH (ref 65–99)
Potassium: 4.8 mmol/L (ref 3.5–5.1)
Sodium: 131 mmol/L — ABNORMAL LOW (ref 135–145)

## 2016-06-14 LAB — CBC
HCT: 34.8 % — ABNORMAL LOW (ref 39.0–52.0)
Hemoglobin: 12.1 g/dL — ABNORMAL LOW (ref 13.0–17.0)
MCH: 29.8 pg (ref 26.0–34.0)
MCHC: 34.8 g/dL (ref 30.0–36.0)
MCV: 85.7 fL (ref 78.0–100.0)
Platelets: 149 10*3/uL — ABNORMAL LOW (ref 150–400)
RBC: 4.06 MIL/uL — ABNORMAL LOW (ref 4.22–5.81)
RDW: 13.7 % (ref 11.5–15.5)
WBC: 8.1 10*3/uL (ref 4.0–10.5)

## 2016-06-14 LAB — GLUCOSE, CAPILLARY: Glucose-Capillary: 464 mg/dL — ABNORMAL HIGH (ref 65–99)

## 2016-06-14 NOTE — Progress Notes (Signed)
Spoke with Manuela Schwartz, LPN, at surgeons office to make MD aware of pt history of elevated blood glucose. Clarified with Manuela Schwartz if okay to use current type and screen ( obtained at PAT ) due to expire on 06/20/16 because according to [place], [facility], if pt was transfused on Monday, 06/17/16, the new type and screen ( ordered for today) would only be available for 3 additional days thereafter. Manuela Schwartz, LPN, stated that it is okay to use previous type and screen obtained at PAT. LVM with Susan,LPN, to make MD aware of pt elevated blood glucose of 482 (BMP). Ebony Hail, PA, Anesthesia, made aware of pt fasting BG values ( Wed. 508, Thurs. 498 and today 454). Pt ate breakfast approximately 1 hour after taking fasting blood glucose today ( 7:30AM. per pt) . Pt took 25 units of Basaglar Insulin this AM as instructed.  Pt POC BG was 464 upon arrival. [place], [place], Anesthesia is aware of lab results.

## 2016-06-16 MED ORDER — SODIUM CHLORIDE 0.9 % IV SOLN
INTRAVENOUS | Status: AC
  Administered 2016-06-17: 3.2 [IU]/h via INTRAVENOUS
  Filled 2016-06-16: qty 2.5

## 2016-06-16 MED ORDER — NITROGLYCERIN IN D5W 200-5 MCG/ML-% IV SOLN
2.0000 ug/min | INTRAVENOUS | Status: DC
  Filled 2016-06-16: qty 250

## 2016-06-16 MED ORDER — MAGNESIUM SULFATE 50 % IJ SOLN
40.0000 meq | INTRAMUSCULAR | Status: DC
  Filled 2016-06-16: qty 10

## 2016-06-16 MED ORDER — DEXTROSE 5 % IV SOLN
750.0000 mg | INTRAVENOUS | Status: DC
  Filled 2016-06-16: qty 750

## 2016-06-16 MED ORDER — POTASSIUM CHLORIDE 2 MEQ/ML IV SOLN
80.0000 meq | INTRAVENOUS | Status: DC
  Filled 2016-06-16: qty 40

## 2016-06-16 MED ORDER — TRANEXAMIC ACID 1000 MG/10ML IV SOLN
1.5000 mg/kg/h | INTRAVENOUS | Status: AC
  Administered 2016-06-17: 1.5 mg/kg/h via INTRAVENOUS
  Filled 2016-06-16: qty 25

## 2016-06-16 MED ORDER — PLASMA-LYTE 148 IV SOLN
INTRAVENOUS | Status: AC
  Administered 2016-06-17: 500 mL
  Filled 2016-06-16: qty 2.5

## 2016-06-16 MED ORDER — DEXTROSE 5 % IV SOLN
1.5000 g | INTRAVENOUS | Status: AC
  Administered 2016-06-17: .75 g via INTRAVENOUS
  Administered 2016-06-17: 1.5 g via INTRAVENOUS
  Filled 2016-06-16: qty 1.5

## 2016-06-16 MED ORDER — SODIUM CHLORIDE 0.9 % IV SOLN
INTRAVENOUS | Status: DC
  Filled 2016-06-16: qty 30

## 2016-06-16 MED ORDER — VANCOMYCIN HCL 10 G IV SOLR
1500.0000 mg | INTRAVENOUS | Status: AC
  Administered 2016-06-17: 1500 mg via INTRAVENOUS
  Filled 2016-06-16: qty 1500

## 2016-06-16 MED ORDER — DOPAMINE-DEXTROSE 3.2-5 MG/ML-% IV SOLN
0.0000 ug/kg/min | INTRAVENOUS | Status: DC
  Filled 2016-06-16: qty 250

## 2016-06-16 MED ORDER — TRANEXAMIC ACID (OHS) BOLUS VIA INFUSION
15.0000 mg/kg | INTRAVENOUS | Status: AC
  Administered 2016-06-17: 1281 mg via INTRAVENOUS
  Filled 2016-06-16: qty 1281

## 2016-06-16 MED ORDER — SODIUM CHLORIDE 0.9 % IV SOLN
30.0000 ug/min | INTRAVENOUS | Status: AC
  Administered 2016-06-17: 50 ug/min via INTRAVENOUS
  Filled 2016-06-16: qty 2

## 2016-06-16 MED ORDER — DEXMEDETOMIDINE HCL IN NACL 400 MCG/100ML IV SOLN
0.1000 ug/kg/h | INTRAVENOUS | Status: AC
  Administered 2016-06-17: .2 ug/kg/h via INTRAVENOUS
  Filled 2016-06-16: qty 100

## 2016-06-16 MED ORDER — TRANEXAMIC ACID (OHS) PUMP PRIME SOLUTION
2.0000 mg/kg | INTRAVENOUS | Status: DC
  Filled 2016-06-16: qty 1.71

## 2016-06-16 MED ORDER — EPINEPHRINE PF 1 MG/ML IJ SOLN
0.0000 ug/min | INTRAVENOUS | Status: DC
  Filled 2016-06-16: qty 4

## 2016-06-16 NOTE — Anesthesia Preprocedure Evaluation (Addendum)
Anesthesia Evaluation  Patient identified by MRN, date of birth, ID band Patient awake    Reviewed: Allergy & Precautions, H&P , NPO status , Patient's Chart, lab work & pertinent test results, reviewed documented beta blocker date and time   Airway Mallampati: II  TM Distance: >3 FB Neck ROM: Full    Dental no notable dental hx. (+) Teeth Intact, Dental Advisory Given   Pulmonary neg pulmonary ROS, former smoker,    Pulmonary exam normal breath sounds clear to auscultation       Cardiovascular Exercise Tolerance: Good hypertension, Pt. on medications and Pt. on home beta blockers + CAD  negative cardio ROS   Rhythm:Regular Rate:Normal     Neuro/Psych  Headaches, CVA, Residual Symptoms negative neurological ROS  negative psych ROS   GI/Hepatic Neg liver ROS, GERD  Medicated and Controlled,  Endo/Other  negative endocrine ROSdiabetes, Oral Hypoglycemic Agents  Renal/GU negative Renal ROS  negative genitourinary   Musculoskeletal   Abdominal   Peds  Hematology negative hematology ROS (+)   Anesthesia Other Findings - Left ventricle: The cavity size was normal. Wall thickness was   normal. Systolic function was normal. The estimated ejection   fraction was in the range of 50% to 55%. Probable severe   hypokinesis and scarring of the basalinferolateral and inferior   myocardium; in the distribution of the right coronary or left   circumflex coronary artery. Features are consistent with a   pseudonormal left ventricular filling pattern, with concomitant   abnormal relaxation and increased filling pressure (grade 2   diastolic dysfunction). - Mitral valve: There was mild to moderate regurgitation. - Left atrium: The atrium was mildly dilated.  Reproductive/Obstetrics negative OB ROS                          Anesthesia Physical Anesthesia Plan  ASA: IV  Anesthesia Plan: General   Post-op  Pain Management:    Induction: Intravenous  Airway Management Planned: Oral ETT  Additional Equipment: Arterial line, CVP, PA Cath, TEE and Ultrasound Guidance Line Placement  Intra-op Plan:   Post-operative Plan: Post-operative intubation/ventilation  Informed Consent: I have reviewed the patients History and Physical, chart, labs and discussed the procedure including the risks, benefits and alternatives for the proposed anesthesia with the patient or authorized representative who has indicated his/her understanding and acceptance.   Dental advisory given  Plan Discussed with: CRNA  Anesthesia Plan Comments:         Anesthesia Quick Evaluation

## 2016-06-17 ENCOUNTER — Encounter (HOSPITAL_COMMUNITY): Payer: Self-pay

## 2016-06-17 ENCOUNTER — Inpatient Hospital Stay (HOSPITAL_COMMUNITY): Payer: PPO | Admitting: Vascular Surgery

## 2016-06-17 ENCOUNTER — Inpatient Hospital Stay (HOSPITAL_COMMUNITY)
Admission: RE | Admit: 2016-06-17 | Discharge: 2016-06-26 | DRG: 235 | Disposition: A | Discharge status: Skilled Nursing Facility | Payer: PPO | Source: Ambulatory Visit | Attending: Cardiothoracic Surgery | Admitting: Cardiothoracic Surgery

## 2016-06-17 ENCOUNTER — Inpatient Hospital Stay (HOSPITAL_COMMUNITY): Payer: PPO

## 2016-06-17 ENCOUNTER — Inpatient Hospital Stay (HOSPITAL_COMMUNITY)
Admission: RE | Disposition: A | Discharge status: Skilled Nursing Facility | Payer: Self-pay | Source: Ambulatory Visit | Attending: Cardiothoracic Surgery

## 2016-06-17 DIAGNOSIS — I2510 Atherosclerotic heart disease of native coronary artery without angina pectoris: Secondary | ICD-10-CM | POA: Diagnosis not present

## 2016-06-17 DIAGNOSIS — I255 Ischemic cardiomyopathy: Secondary | ICD-10-CM | POA: Diagnosis not present

## 2016-06-17 DIAGNOSIS — J90 Pleural effusion, not elsewhere classified: Secondary | ICD-10-CM | POA: Diagnosis not present

## 2016-06-17 DIAGNOSIS — H53462 Homonymous bilateral field defects, left side: Secondary | ICD-10-CM | POA: Diagnosis not present

## 2016-06-17 DIAGNOSIS — R278 Other lack of coordination: Secondary | ICD-10-CM | POA: Diagnosis not present

## 2016-06-17 DIAGNOSIS — E785 Hyperlipidemia, unspecified: Secondary | ICD-10-CM | POA: Diagnosis present

## 2016-06-17 DIAGNOSIS — L03113 Cellulitis of right upper limb: Secondary | ICD-10-CM | POA: Diagnosis not present

## 2016-06-17 DIAGNOSIS — I700 Atherosclerosis of aorta: Secondary | ICD-10-CM | POA: Diagnosis present

## 2016-06-17 DIAGNOSIS — Z951 Presence of aortocoronary bypass graft: Secondary | ICD-10-CM

## 2016-06-17 DIAGNOSIS — R079 Chest pain, unspecified: Secondary | ICD-10-CM | POA: Diagnosis not present

## 2016-06-17 DIAGNOSIS — R2689 Other abnormalities of gait and mobility: Secondary | ICD-10-CM | POA: Diagnosis not present

## 2016-06-17 DIAGNOSIS — E1165 Type 2 diabetes mellitus with hyperglycemia: Secondary | ICD-10-CM | POA: Diagnosis not present

## 2016-06-17 DIAGNOSIS — E11628 Type 2 diabetes mellitus with other skin complications: Secondary | ICD-10-CM | POA: Diagnosis not present

## 2016-06-17 DIAGNOSIS — H538 Other visual disturbances: Secondary | ICD-10-CM | POA: Diagnosis not present

## 2016-06-17 DIAGNOSIS — D689 Coagulation defect, unspecified: Secondary | ICD-10-CM | POA: Diagnosis not present

## 2016-06-17 DIAGNOSIS — D62 Acute posthemorrhagic anemia: Secondary | ICD-10-CM | POA: Diagnosis not present

## 2016-06-17 DIAGNOSIS — I499 Cardiac arrhythmia, unspecified: Secondary | ICD-10-CM | POA: Diagnosis not present

## 2016-06-17 DIAGNOSIS — J9690 Respiratory failure, unspecified, unspecified whether with hypoxia or hypercapnia: Secondary | ICD-10-CM | POA: Diagnosis not present

## 2016-06-17 DIAGNOSIS — Z794 Long term (current) use of insulin: Secondary | ICD-10-CM | POA: Diagnosis not present

## 2016-06-17 DIAGNOSIS — H539 Unspecified visual disturbance: Secondary | ICD-10-CM

## 2016-06-17 DIAGNOSIS — I110 Hypertensive heart disease with heart failure: Secondary | ICD-10-CM | POA: Diagnosis present

## 2016-06-17 DIAGNOSIS — I97820 Postprocedural cerebrovascular infarction during cardiac surgery: Secondary | ICD-10-CM | POA: Diagnosis not present

## 2016-06-17 DIAGNOSIS — I639 Cerebral infarction, unspecified: Secondary | ICD-10-CM

## 2016-06-17 DIAGNOSIS — E119 Type 2 diabetes mellitus without complications: Secondary | ICD-10-CM | POA: Diagnosis not present

## 2016-06-17 DIAGNOSIS — I509 Heart failure, unspecified: Secondary | ICD-10-CM | POA: Diagnosis not present

## 2016-06-17 DIAGNOSIS — R51 Headache: Secondary | ICD-10-CM | POA: Diagnosis not present

## 2016-06-17 DIAGNOSIS — I10 Essential (primary) hypertension: Secondary | ICD-10-CM | POA: Diagnosis not present

## 2016-06-17 DIAGNOSIS — J9811 Atelectasis: Secondary | ICD-10-CM | POA: Diagnosis not present

## 2016-06-17 DIAGNOSIS — Z8673 Personal history of transient ischemic attack (TIA), and cerebral infarction without residual deficits: Secondary | ICD-10-CM

## 2016-06-17 DIAGNOSIS — I340 Nonrheumatic mitral (valve) insufficiency: Secondary | ICD-10-CM | POA: Diagnosis not present

## 2016-06-17 DIAGNOSIS — K219 Gastro-esophageal reflux disease without esophagitis: Secondary | ICD-10-CM | POA: Diagnosis present

## 2016-06-17 DIAGNOSIS — M109 Gout, unspecified: Secondary | ICD-10-CM | POA: Diagnosis not present

## 2016-06-17 DIAGNOSIS — Z4682 Encounter for fitting and adjustment of non-vascular catheter: Secondary | ICD-10-CM | POA: Diagnosis not present

## 2016-06-17 DIAGNOSIS — Z87891 Personal history of nicotine dependence: Secondary | ICD-10-CM | POA: Diagnosis not present

## 2016-06-17 DIAGNOSIS — R0602 Shortness of breath: Secondary | ICD-10-CM | POA: Diagnosis not present

## 2016-06-17 DIAGNOSIS — I25110 Atherosclerotic heart disease of native coronary artery with unstable angina pectoris: Secondary | ICD-10-CM | POA: Diagnosis not present

## 2016-06-17 HISTORY — PX: CORONARY ARTERY BYPASS GRAFT: SHX141

## 2016-06-17 HISTORY — PX: TEE WITHOUT CARDIOVERSION: SHX5443

## 2016-06-17 LAB — POCT I-STAT 3, ART BLOOD GAS (G3+)
Acid-Base Excess: 2 mmol/L (ref 0.0–2.0)
Acid-Base Excess: 1 mmol/L (ref 0.0–2.0)
Acid-Base Excess: 1 mmol/L (ref 0.0–2.0)
Acid-base deficit: 1 mmol/L (ref 0.0–2.0)
Acid-base deficit: 3 mmol/L — ABNORMAL HIGH (ref 0.0–2.0)
Bicarbonate: 26.2 mmol/L (ref 20.0–28.0)
Bicarbonate: 26.2 mmol/L (ref 20.0–28.0)
Bicarbonate: 25.4 mmol/L (ref 20.0–28.0)
Bicarbonate: 23.6 mmol/L (ref 20.0–28.0)
Bicarbonate: 22.6 mmol/L (ref 20.0–28.0)
O2 Saturation: 100 %
O2 Saturation: 100 %
O2 Saturation: 99 %
O2 Saturation: 99 %
O2 Saturation: 98 %
Patient temperature: 33.5
Patient temperature: 37
Patient temperature: 34.7
Patient temperature: 36.6
Patient temperature: 37
TCO2: 27 mmol/L (ref 0–100)
TCO2: 27 mmol/L (ref 0–100)
TCO2: 27 mmol/L (ref 0–100)
TCO2: 25 mmol/L (ref 0–100)
TCO2: 24 mmol/L (ref 0–100)
pCO2 arterial: 35.1 mmHg (ref 32.0–48.0)
pCO2 arterial: 44.6 mmHg (ref 32.0–48.0)
pCO2 arterial: 35.4 mmHg (ref 32.0–48.0)
pCO2 arterial: 34.8 mmHg (ref 32.0–48.0)
pCO2 arterial: 39.2 mmHg (ref 32.0–48.0)
pH, Arterial: 7.466 — ABNORMAL HIGH (ref 7.350–7.450)
pH, Arterial: 7.376 (ref 7.350–7.450)
pH, Arterial: 7.455 — ABNORMAL HIGH (ref 7.350–7.450)
pH, Arterial: 7.438 (ref 7.350–7.450)
pH, Arterial: 7.368 (ref 7.350–7.450)
pO2, Arterial: 306 mmHg — ABNORMAL HIGH (ref 83.0–108.0)
pO2, Arterial: 278 mmHg — ABNORMAL HIGH (ref 83.0–108.0)
pO2, Arterial: 108 mmHg (ref 83.0–108.0)
pO2, Arterial: 149 mmHg — ABNORMAL HIGH (ref 83.0–108.0)
pO2, Arterial: 107 mmHg (ref 83.0–108.0)

## 2016-06-17 LAB — POCT I-STAT, CHEM 8
BUN: 21 mg/dL — ABNORMAL HIGH (ref 6–20)
BUN: 17 mg/dL (ref 6–20)
BUN: 17 mg/dL (ref 6–20)
BUN: 15 mg/dL (ref 6–20)
BUN: 13 mg/dL (ref 6–20)
Calcium, Ion: 1.24 mmol/L (ref 1.15–1.40)
Calcium, Ion: 1.22 mmol/L (ref 1.15–1.40)
Calcium, Ion: 1.16 mmol/L (ref 1.15–1.40)
Calcium, Ion: 1.05 mmol/L — ABNORMAL LOW (ref 1.15–1.40)
Calcium, Ion: 1.17 mmol/L (ref 1.15–1.40)
Chloride: 99 mmol/L — ABNORMAL LOW (ref 101–111)
Chloride: 101 mmol/L (ref 101–111)
Chloride: 97 mmol/L — ABNORMAL LOW (ref 101–111)
Chloride: 97 mmol/L — ABNORMAL LOW (ref 101–111)
Chloride: 103 mmol/L (ref 101–111)
Creatinine, Ser: 1 mg/dL (ref 0.61–1.24)
Creatinine, Ser: 0.9 mg/dL (ref 0.61–1.24)
Creatinine, Ser: 1 mg/dL (ref 0.61–1.24)
Creatinine, Ser: 1 mg/dL (ref 0.61–1.24)
Creatinine, Ser: 0.9 mg/dL (ref 0.61–1.24)
Glucose, Bld: 221 mg/dL — ABNORMAL HIGH (ref 65–99)
Glucose, Bld: 190 mg/dL — ABNORMAL HIGH (ref 65–99)
Glucose, Bld: 154 mg/dL — ABNORMAL HIGH (ref 65–99)
Glucose, Bld: 138 mg/dL — ABNORMAL HIGH (ref 65–99)
Glucose, Bld: 146 mg/dL — ABNORMAL HIGH (ref 65–99)
HCT: 30 % — ABNORMAL LOW (ref 39.0–52.0)
HCT: 26 % — ABNORMAL LOW (ref 39.0–52.0)
HCT: 24 % — ABNORMAL LOW (ref 39.0–52.0)
HCT: 23 % — ABNORMAL LOW (ref 39.0–52.0)
HCT: 22 % — ABNORMAL LOW (ref 39.0–52.0)
Hemoglobin: 10.2 g/dL — ABNORMAL LOW (ref 13.0–17.0)
Hemoglobin: 8.8 g/dL — ABNORMAL LOW (ref 13.0–17.0)
Hemoglobin: 8.2 g/dL — ABNORMAL LOW (ref 13.0–17.0)
Hemoglobin: 7.8 g/dL — ABNORMAL LOW (ref 13.0–17.0)
Hemoglobin: 7.5 g/dL — ABNORMAL LOW (ref 13.0–17.0)
Potassium: 3.7 mmol/L (ref 3.5–5.1)
Potassium: 3.5 mmol/L (ref 3.5–5.1)
Potassium: 3.9 mmol/L (ref 3.5–5.1)
Potassium: 3.5 mmol/L (ref 3.5–5.1)
Potassium: 4 mmol/L (ref 3.5–5.1)
Sodium: 135 mmol/L (ref 135–145)
Sodium: 136 mmol/L (ref 135–145)
Sodium: 133 mmol/L — ABNORMAL LOW (ref 135–145)
Sodium: 136 mmol/L (ref 135–145)
Sodium: 138 mmol/L (ref 135–145)
TCO2: 29 mmol/L (ref 0–100)
TCO2: 28 mmol/L (ref 0–100)
TCO2: 26 mmol/L (ref 0–100)
TCO2: 27 mmol/L (ref 0–100)
TCO2: 24 mmol/L (ref 0–100)

## 2016-06-17 LAB — CBC
HCT: 21.7 % — ABNORMAL LOW (ref 39.0–52.0)
Hemoglobin: 7.5 g/dL — ABNORMAL LOW (ref 13.0–17.0)
MCH: 29.5 pg (ref 26.0–34.0)
MCHC: 34.6 g/dL (ref 30.0–36.0)
MCV: 85.4 fL (ref 78.0–100.0)
Platelets: 116 10*3/uL — ABNORMAL LOW (ref 150–400)
RBC: 2.54 MIL/uL — ABNORMAL LOW (ref 4.22–5.81)
RDW: 14.1 % (ref 11.5–15.5)
WBC: 8.9 10*3/uL (ref 4.0–10.5)

## 2016-06-17 LAB — BASIC METABOLIC PANEL
Anion gap: 12 (ref 5–15)
BUN: 19 mg/dL (ref 6–20)
CO2: 26 mmol/L (ref 22–32)
Calcium: 9.5 mg/dL (ref 8.9–10.3)
Chloride: 97 mmol/L — ABNORMAL LOW (ref 101–111)
Creatinine, Ser: 1.3 mg/dL — ABNORMAL HIGH (ref 0.61–1.24)
GFR calc Af Amer: >60 mL/min (ref >60)
GFR calc non Af Amer: 56 mL/min — ABNORMAL LOW (ref >60)
Glucose, Bld: 213 mg/dL — ABNORMAL HIGH (ref 65–99)
Potassium: 4.2 mmol/L (ref 3.5–5.1)
Sodium: 135 mmol/L (ref 135–145)

## 2016-06-17 LAB — GLUCOSE, CAPILLARY
Glucose-Capillary: 100 mg/dL — ABNORMAL HIGH (ref 65–99)
Glucose-Capillary: 100 mg/dL — ABNORMAL HIGH (ref 65–99)
Glucose-Capillary: 103 mg/dL — ABNORMAL HIGH (ref 65–99)
Glucose-Capillary: 108 mg/dL — ABNORMAL HIGH (ref 65–99)
Glucose-Capillary: 118 mg/dL — ABNORMAL HIGH (ref 65–99)
Glucose-Capillary: 121 mg/dL — ABNORMAL HIGH (ref 65–99)
Glucose-Capillary: 140 mg/dL — ABNORMAL HIGH (ref 65–99)
Glucose-Capillary: 145 mg/dL — ABNORMAL HIGH (ref 65–99)
Glucose-Capillary: 227 mg/dL — ABNORMAL HIGH (ref 65–99)
Glucose-Capillary: 55 mg/dL — ABNORMAL LOW (ref 65–99)
Glucose-Capillary: 86 mg/dL (ref 65–99)

## 2016-06-17 LAB — POCT I-STAT 3, VENOUS BLOOD GAS (G3P V)
Acid-base deficit: 1 mmol/L (ref 0.0–2.0)
Bicarbonate: 23.8 mmol/L (ref 20.0–28.0)
O2 Saturation: 70 %
Patient temperature: 33.5
TCO2: 25 mmol/L (ref 0–100)
pCO2, Ven: 35.3 mmHg — ABNORMAL LOW (ref 44.0–60.0)
pH, Ven: 7.422 (ref 7.250–7.430)
pO2, Ven: 29 mmHg — CL (ref 32.0–45.0)

## 2016-06-17 LAB — POCT I-STAT 4, (NA,K, GLUC, HGB,HCT)
Glucose, Bld: 105 mg/dL — ABNORMAL HIGH (ref 65–99)
HCT: 22 % — ABNORMAL LOW (ref 39.0–52.0)
Hemoglobin: 7.5 g/dL — ABNORMAL LOW (ref 13.0–17.0)
Potassium: 3.6 mmol/L (ref 3.5–5.1)
Sodium: 137 mmol/L (ref 135–145)

## 2016-06-17 LAB — CREATININE, SERUM
Creatinine, Ser: 0.91 mg/dL (ref 0.61–1.24)
GFR calc Af Amer: >60 mL/min (ref >60)
GFR calc non Af Amer: >60 mL/min (ref >60)

## 2016-06-17 LAB — APTT: aPTT: 39 seconds — ABNORMAL HIGH (ref 24–36)

## 2016-06-17 LAB — PLATELET COUNT: Platelets: 122 10*3/uL — ABNORMAL LOW (ref 150–400)

## 2016-06-17 LAB — MAGNESIUM: Magnesium: 2.5 mg/dL — ABNORMAL HIGH (ref 1.7–2.4)

## 2016-06-17 LAB — PREPARE RBC (CROSSMATCH)

## 2016-06-17 LAB — HEMOGLOBIN AND HEMATOCRIT, BLOOD
HCT: 23.7 % — ABNORMAL LOW (ref 39.0–52.0)
Hemoglobin: 8.2 g/dL — ABNORMAL LOW (ref 13.0–17.0)

## 2016-06-17 LAB — PROTIME-INR
INR: 1.24
Prothrombin Time: 15.7 seconds — ABNORMAL HIGH (ref 11.4–15.2)

## 2016-06-17 SURGERY — CORONARY ARTERY BYPASS GRAFTING (CABG)
Anesthesia: General | Site: Chest

## 2016-06-17 MED ORDER — HEPARIN SODIUM (PORCINE) 1000 UNIT/ML IJ SOLN
INTRAMUSCULAR | Status: AC
  Filled 2016-06-17: qty 1

## 2016-06-17 MED ORDER — LEVALBUTEROL HCL 0.63 MG/3ML IN NEBU: 0.6300 mg | INHALATION_SOLUTION | RESPIRATORY_TRACT | Status: DC | PRN

## 2016-06-17 MED ORDER — SODIUM CHLORIDE 0.9 % IV SOLN: 250.0000 mL | INTRAVENOUS | Status: DC

## 2016-06-17 MED ORDER — ORAL CARE MOUTH RINSE
15.0000 mL | Freq: Four times a day (QID) | OROMUCOSAL | Status: DC
  Administered 2016-06-17 – 2016-06-18 (×4): 15 mL via OROMUCOSAL

## 2016-06-17 MED ORDER — SODIUM CHLORIDE 0.9 % IV SOLN
20.0000 ug | INTRAVENOUS | Status: AC
  Administered 2016-06-17: 20 ug via INTRAVENOUS
  Filled 2016-06-17: qty 5

## 2016-06-17 MED ORDER — FENTANYL CITRATE (PF) 250 MCG/5ML IJ SOLN
INTRAMUSCULAR | Status: AC
  Filled 2016-06-17: qty 5

## 2016-06-17 MED ORDER — ACETAMINOPHEN 650 MG RE SUPP
650.0000 mg | Freq: Once | RECTAL | Status: AC
  Administered 2016-06-17: 650 mg via RECTAL

## 2016-06-17 MED ORDER — SODIUM CHLORIDE 0.9 % IJ SOLN
OROMUCOSAL | Status: DC | PRN
  Administered 2016-06-17: 4 mL via TOPICAL

## 2016-06-17 MED ORDER — LIDOCAINE HCL (CARDIAC) 10 MG/ML IV SOLN
100.0000 mg | Freq: Once | INTRAVENOUS | Status: AC
  Administered 2016-06-17: 100 mg via INTRAVENOUS
  Filled 2016-06-17: qty 10

## 2016-06-17 MED ORDER — BISACODYL 5 MG PO TBEC
10.0000 mg | DELAYED_RELEASE_TABLET | Freq: Every day | ORAL | Status: DC
  Administered 2016-06-18 – 2016-06-26 (×6): 10 mg via ORAL
  Filled 2016-06-17 (×7): qty 2

## 2016-06-17 MED ORDER — PROPOFOL 10 MG/ML IV BOLUS
INTRAVENOUS | Status: DC | PRN
  Administered 2016-06-17: 50 mg via INTRAVENOUS

## 2016-06-17 MED ORDER — METOCLOPRAMIDE HCL 5 MG/ML IJ SOLN
10.0000 mg | Freq: Four times a day (QID) | INTRAMUSCULAR | Status: DC
  Administered 2016-06-17 – 2016-06-20 (×10): 10 mg via INTRAVENOUS
  Filled 2016-06-17 (×9): qty 2

## 2016-06-17 MED ORDER — VANCOMYCIN HCL IN DEXTROSE 1-5 GM/200ML-% IV SOLN
1000.0000 mg | Freq: Once | INTRAVENOUS | Status: DC
  Filled 2016-06-17: qty 200

## 2016-06-17 MED ORDER — MIDAZOLAM HCL 10 MG/2ML IJ SOLN
INTRAMUSCULAR | Status: AC
  Filled 2016-06-17: qty 2

## 2016-06-17 MED ORDER — LACTATED RINGERS IV SOLN
INTRAVENOUS | Status: DC | PRN
  Administered 2016-06-17 (×2): via INTRAVENOUS

## 2016-06-17 MED ORDER — LACTATED RINGERS IV SOLN
INTRAVENOUS | Status: DC
  Administered 2016-06-17: 14:00:00 via INTRAVENOUS

## 2016-06-17 MED ORDER — HEMOSTATIC AGENTS (NO CHARGE) OPTIME
TOPICAL | Status: DC | PRN
  Administered 2016-06-17: 1 via TOPICAL

## 2016-06-17 MED ORDER — ROCURONIUM BROMIDE 10 MG/ML (PF) SYRINGE
PREFILLED_SYRINGE | INTRAVENOUS | Status: DC | PRN
  Administered 2016-06-17 (×2): 50 mg via INTRAVENOUS
  Administered 2016-06-17: 20 mg via INTRAVENOUS
  Administered 2016-06-17: 50 mg via INTRAVENOUS
  Administered 2016-06-17: 30 mg via INTRAVENOUS
  Administered 2016-06-17: 50 mg via INTRAVENOUS

## 2016-06-17 MED ORDER — ALBUMIN HUMAN 5 % IV SOLN
250.0000 mL | INTRAVENOUS | Status: AC | PRN
  Administered 2016-06-17 (×3): 250 mL via INTRAVENOUS
  Filled 2016-06-17: qty 250

## 2016-06-17 MED ORDER — METOPROLOL TARTRATE 25 MG/10 ML ORAL SUSPENSION: 12.5000 mg | Freq: Two times a day (BID) | ORAL | Status: DC

## 2016-06-17 MED ORDER — DOCUSATE SODIUM 100 MG PO CAPS
200.0000 mg | ORAL_CAPSULE | Freq: Every day | ORAL | Status: DC
  Administered 2016-06-18 – 2016-06-26 (×6): 200 mg via ORAL
  Filled 2016-06-17 (×7): qty 2

## 2016-06-17 MED ORDER — MILRINONE LACTATE IN DEXTROSE 20-5 MG/100ML-% IV SOLN
0.1250 ug/kg/min | INTRAVENOUS | Status: DC
  Filled 2016-06-17: qty 100

## 2016-06-17 MED ORDER — MORPHINE SULFATE (PF) 2 MG/ML IV SOLN: 1.0000 mg | INTRAVENOUS | Status: DC | PRN

## 2016-06-17 MED ORDER — PANTOPRAZOLE SODIUM 40 MG PO TBEC
40.0000 mg | DELAYED_RELEASE_TABLET | Freq: Every day | ORAL | Status: DC
  Administered 2016-06-19 – 2016-06-26 (×8): 40 mg via ORAL
  Filled 2016-06-17 (×8): qty 1

## 2016-06-17 MED ORDER — OXYCODONE HCL 5 MG PO TABS
5.0000 mg | ORAL_TABLET | ORAL | Status: DC | PRN
  Administered 2016-06-18 – 2016-06-25 (×11): 10 mg via ORAL
  Filled 2016-06-17 (×11): qty 2

## 2016-06-17 MED ORDER — MIDAZOLAM HCL 5 MG/5ML IJ SOLN
INTRAMUSCULAR | Status: DC | PRN
  Administered 2016-06-17: 2 mg via INTRAVENOUS
  Administered 2016-06-17: 3 mg via INTRAVENOUS
  Administered 2016-06-17: 2 mg via INTRAVENOUS
  Administered 2016-06-17: 1 mg via INTRAVENOUS
  Administered 2016-06-17 (×2): 2 mg via INTRAVENOUS

## 2016-06-17 MED ORDER — MAGNESIUM SULFATE 4 GM/100ML IV SOLN
4.0000 g | Freq: Once | INTRAVENOUS | Status: AC
  Administered 2016-06-17: 4 g via INTRAVENOUS
  Filled 2016-06-17: qty 100

## 2016-06-17 MED ORDER — MIDAZOLAM HCL 2 MG/2ML IJ SOLN
INTRAMUSCULAR | Status: AC
  Filled 2016-06-17: qty 2

## 2016-06-17 MED ORDER — FAMOTIDINE IN NACL 20-0.9 MG/50ML-% IV SOLN
20.0000 mg | Freq: Two times a day (BID) | INTRAVENOUS | Status: DC
  Administered 2016-06-17: 20 mg via INTRAVENOUS

## 2016-06-17 MED ORDER — MORPHINE SULFATE (PF) 2 MG/ML IV SOLN: 2.0000 mg | INTRAVENOUS | Status: DC | PRN

## 2016-06-17 MED ORDER — SODIUM CHLORIDE 0.9% FLUSH: 3.0000 mL | INTRAVENOUS | Status: DC | PRN

## 2016-06-17 MED ORDER — SODIUM CHLORIDE 0.9 % IV SOLN
INTRAVENOUS | Status: DC
  Administered 2016-06-17: 1.6 [IU]/h via INTRAVENOUS
  Administered 2016-06-17: 2.3 [IU]/h via INTRAVENOUS
  Filled 2016-06-17 (×2): qty 2.5

## 2016-06-17 MED ORDER — ACETAMINOPHEN 160 MG/5ML PO SOLN: 650.0000 mg | Freq: Once | ORAL | Status: AC

## 2016-06-17 MED ORDER — BISACODYL 10 MG RE SUPP: 10.0000 mg | Freq: Every day | RECTAL | Status: DC

## 2016-06-17 MED ORDER — CHLORHEXIDINE GLUCONATE 0.12% ORAL RINSE (MEDLINE KIT)
15.0000 mL | Freq: Two times a day (BID) | OROMUCOSAL | Status: DC
  Administered 2016-06-17 – 2016-06-18 (×2): 15 mL via OROMUCOSAL

## 2016-06-17 MED ORDER — ASPIRIN EC 325 MG PO TBEC
325.0000 mg | DELAYED_RELEASE_TABLET | Freq: Every day | ORAL | Status: DC
  Administered 2016-06-18 – 2016-06-21 (×4): 325 mg via ORAL
  Filled 2016-06-17 (×4): qty 1

## 2016-06-17 MED ORDER — MILRINONE LACTATE IN DEXTROSE 20-5 MG/100ML-% IV SOLN
INTRAVENOUS | Status: DC | PRN
  Administered 2016-06-17: .25 mg/kg/min via INTRAVENOUS

## 2016-06-17 MED ORDER — METOPROLOL TARTRATE 12.5 MG HALF TABLET
12.5000 mg | ORAL_TABLET | Freq: Once | ORAL | Status: DC
  Filled 2016-06-17: qty 1

## 2016-06-17 MED ORDER — FENTANYL CITRATE (PF) 250 MCG/5ML IJ SOLN
INTRAMUSCULAR | Status: DC | PRN
  Administered 2016-06-17: 100 ug via INTRAVENOUS
  Administered 2016-06-17: 250 ug via INTRAVENOUS
  Administered 2016-06-17: 100 ug via INTRAVENOUS
  Administered 2016-06-17: 250 ug via INTRAVENOUS
  Administered 2016-06-17: 750 ug via INTRAVENOUS
  Administered 2016-06-17 (×2): 150 ug via INTRAVENOUS

## 2016-06-17 MED ORDER — DEXTROSE 5 % IV SOLN
1.5000 g | Freq: Two times a day (BID) | INTRAVENOUS | Status: AC
  Administered 2016-06-17 – 2016-06-19 (×4): 1.5 g via INTRAVENOUS
  Filled 2016-06-17 (×4): qty 1.5

## 2016-06-17 MED ORDER — PROTAMINE SULFATE 10 MG/ML IV SOLN
INTRAVENOUS | Status: AC
  Filled 2016-06-17: qty 15

## 2016-06-17 MED ORDER — PHENYLEPHRINE 40 MCG/ML (10ML) SYRINGE FOR IV PUSH (FOR BLOOD PRESSURE SUPPORT)
PREFILLED_SYRINGE | INTRAVENOUS | Status: AC
  Filled 2016-06-17: qty 10

## 2016-06-17 MED ORDER — SODIUM CHLORIDE 0.9 % IJ SOLN
INTRAMUSCULAR | Status: DC | PRN
  Administered 2016-06-17: 4 mL via TOPICAL

## 2016-06-17 MED ORDER — ALBUMIN HUMAN 5 % IV SOLN
INTRAVENOUS | Status: DC | PRN
  Administered 2016-06-17: 12:00:00 via INTRAVENOUS

## 2016-06-17 MED ORDER — PROTAMINE SULFATE 10 MG/ML IV SOLN
INTRAVENOUS | Status: DC | PRN
Start: 2016-06-17 — End: 2016-06-17
  Administered 2016-06-17: 100 mg via INTRAVENOUS
  Administered 2016-06-17: 50 mg via INTRAVENOUS
  Administered 2016-06-17: 30 mg via INTRAVENOUS
  Administered 2016-06-17: 50 mg via INTRAVENOUS
  Administered 2016-06-17: 30 mg via INTRAVENOUS
  Administered 2016-06-17 (×3): 50 mg via INTRAVENOUS
  Administered 2016-06-17: 20 mg via INTRAVENOUS
  Administered 2016-06-17: 30 mg via INTRAVENOUS

## 2016-06-17 MED ORDER — SODIUM CHLORIDE 0.9 % IV SOLN
0.0000 ug/min | INTRAVENOUS | Status: DC
  Administered 2016-06-17: 15 ug/min via INTRAVENOUS
  Filled 2016-06-17 (×2): qty 2

## 2016-06-17 MED ORDER — ARTIFICIAL TEARS OP OINT
TOPICAL_OINTMENT | OPHTHALMIC | Status: AC
  Filled 2016-06-17: qty 3.5

## 2016-06-17 MED ORDER — SODIUM CHLORIDE 0.9 % IV SOLN
30.0000 meq | Freq: Once | INTRAVENOUS | Status: AC
  Administered 2016-06-17: 30 meq via INTRAVENOUS
  Filled 2016-06-17: qty 15

## 2016-06-17 MED ORDER — ASPIRIN 81 MG PO CHEW: 324.0000 mg | CHEWABLE_TABLET | Freq: Every day | ORAL | Status: DC

## 2016-06-17 MED ORDER — FENTANYL CITRATE (PF) 250 MCG/5ML IJ SOLN
INTRAMUSCULAR | Status: AC
  Filled 2016-06-17: qty 25

## 2016-06-17 MED ORDER — DEXMEDETOMIDINE HCL IN NACL 200 MCG/50ML IV SOLN
0.0000 ug/kg/h | INTRAVENOUS | Status: DC
  Administered 2016-06-17: 0.5 ug/kg/h via INTRAVENOUS

## 2016-06-17 MED ORDER — CHLORHEXIDINE GLUCONATE 4 % EX LIQD: 30.0000 mL | CUTANEOUS | Status: DC

## 2016-06-17 MED ORDER — LACTATED RINGERS IV SOLN: 500.0000 mL | Freq: Once | INTRAVENOUS | Status: DC | PRN

## 2016-06-17 MED ORDER — NOREPINEPHRINE BITARTRATE 1 MG/ML IV SOLN
0.0000 ug/min | INTRAVENOUS | Status: DC
  Filled 2016-06-17: qty 4

## 2016-06-17 MED ORDER — MORPHINE SULFATE (PF) 4 MG/ML IV SOLN
1.0000 mg | INTRAVENOUS | Status: AC | PRN
  Filled 2016-06-17: qty 1

## 2016-06-17 MED ORDER — CHLORHEXIDINE GLUCONATE 0.12 % MT SOLN
15.0000 mL | OROMUCOSAL | Status: AC
  Administered 2016-06-17: 15 mL via OROMUCOSAL

## 2016-06-17 MED ORDER — 0.9 % SODIUM CHLORIDE (POUR BTL) OPTIME
TOPICAL | Status: DC | PRN
  Administered 2016-06-17: 1000 mL

## 2016-06-17 MED ORDER — ARTIFICIAL TEARS OP OINT
TOPICAL_OINTMENT | OPHTHALMIC | Status: DC | PRN
  Administered 2016-06-17: 1 via OPHTHALMIC

## 2016-06-17 MED ORDER — PROPOFOL 10 MG/ML IV BOLUS
INTRAVENOUS | Status: AC
  Filled 2016-06-17: qty 20

## 2016-06-17 MED ORDER — HEPARIN SODIUM (PORCINE) 1000 UNIT/ML IJ SOLN
INTRAMUSCULAR | Status: DC | PRN
  Administered 2016-06-17: 3000 [IU] via INTRAVENOUS
  Administered 2016-06-17: 35000 [IU] via INTRAVENOUS

## 2016-06-17 MED ORDER — SODIUM CHLORIDE 0.9 % IV SOLN
0.0000 ug/kg/h | INTRAVENOUS | Status: DC
  Filled 2016-06-17 (×2): qty 2

## 2016-06-17 MED ORDER — METOPROLOL TARTRATE 12.5 MG HALF TABLET
12.5000 mg | ORAL_TABLET | Freq: Two times a day (BID) | ORAL | Status: DC
  Administered 2016-06-18 – 2016-06-21 (×4): 12.5 mg via ORAL
  Filled 2016-06-17 (×5): qty 1

## 2016-06-17 MED ORDER — SODIUM CHLORIDE 0.9 % IV SOLN
INTRAVENOUS | Status: DC
  Administered 2016-06-17: 14:00:00 via INTRAVENOUS

## 2016-06-17 MED ORDER — SODIUM CHLORIDE 0.9% FLUSH
3.0000 mL | Freq: Two times a day (BID) | INTRAVENOUS | Status: DC
  Administered 2016-06-18 – 2016-06-25 (×4): 3 mL via INTRAVENOUS

## 2016-06-17 MED ORDER — NITROGLYCERIN IN D5W 200-5 MCG/ML-% IV SOLN: 0.0000 ug/min | INTRAVENOUS | Status: DC

## 2016-06-17 MED ORDER — MIDAZOLAM HCL 2 MG/2ML IJ SOLN: 2.0000 mg | INTRAMUSCULAR | Status: DC | PRN

## 2016-06-17 MED ORDER — ONDANSETRON HCL 4 MG/2ML IJ SOLN
4.0000 mg | Freq: Four times a day (QID) | INTRAMUSCULAR | Status: DC | PRN
  Filled 2016-06-17: qty 2

## 2016-06-17 MED ORDER — VANCOMYCIN HCL IN DEXTROSE 1-5 GM/200ML-% IV SOLN
1000.0000 mg | Freq: Two times a day (BID) | INTRAVENOUS | Status: AC
  Administered 2016-06-17 – 2016-06-18 (×3): 1000 mg via INTRAVENOUS
  Filled 2016-06-17 (×3): qty 200

## 2016-06-17 MED ORDER — LACTATED RINGERS IV SOLN
INTRAVENOUS | Status: DC | PRN
  Administered 2016-06-17: 07:00:00 via INTRAVENOUS

## 2016-06-17 MED ORDER — ACETAMINOPHEN 500 MG PO TABS
1000.0000 mg | ORAL_TABLET | Freq: Four times a day (QID) | ORAL | Status: AC
  Administered 2016-06-17 – 2016-06-22 (×14): 1000 mg via ORAL
  Filled 2016-06-17 (×14): qty 2

## 2016-06-17 MED ORDER — ATORVASTATIN CALCIUM 40 MG PO TABS
40.0000 mg | ORAL_TABLET | Freq: Every day | ORAL | Status: DC
  Administered 2016-06-18 – 2016-06-25 (×8): 40 mg via ORAL
  Filled 2016-06-17 (×9): qty 1

## 2016-06-17 MED ORDER — METOPROLOL TARTRATE 5 MG/5ML IV SOLN: 2.5000 mg | INTRAVENOUS | Status: DC | PRN

## 2016-06-17 MED ORDER — ROCURONIUM BROMIDE 50 MG/5ML IV SOSY
PREFILLED_SYRINGE | INTRAVENOUS | Status: AC
  Filled 2016-06-17: qty 5

## 2016-06-17 MED ORDER — LIDOCAINE IN D5W 4-5 MG/ML-% IV SOLN
2.0000 mg/min | INTRAVENOUS | Status: AC
  Administered 2016-06-17: 2 mg/min via INTRAVENOUS
  Filled 2016-06-17: qty 500

## 2016-06-17 MED ORDER — DEXTROSE 50 % IV SOLN
INTRAVENOUS | Status: AC
  Administered 2016-06-17: 18 mL
  Filled 2016-06-17: qty 50

## 2016-06-17 MED ORDER — ROCURONIUM BROMIDE 50 MG/5ML IV SOSY
PREFILLED_SYRINGE | INTRAVENOUS | Status: AC
  Filled 2016-06-17: qty 15

## 2016-06-17 MED ORDER — SODIUM CHLORIDE 0.45 % IV SOLN: INTRAVENOUS | Status: DC | PRN

## 2016-06-17 MED ORDER — ACETAMINOPHEN 160 MG/5ML PO SOLN: 1000.0000 mg | Freq: Four times a day (QID) | ORAL | Status: AC

## 2016-06-17 MED ORDER — PROTAMINE SULFATE 10 MG/ML IV SOLN
INTRAVENOUS | Status: AC
  Filled 2016-06-17: qty 25

## 2016-06-17 MED ORDER — CHLORHEXIDINE GLUCONATE 0.12 % MT SOLN
15.0000 mL | Freq: Once | OROMUCOSAL | Status: AC
  Administered 2016-06-17: 15 mL via OROMUCOSAL
  Filled 2016-06-17: qty 15

## 2016-06-17 MED ORDER — MILRINONE LACTATE IN DEXTROSE 20-5 MG/100ML-% IV SOLN
0.1250 ug/kg/min | INTRAVENOUS | Status: DC
  Administered 2016-06-18: 0.25 ug/kg/min via INTRAVENOUS
  Administered 2016-06-18 – 2016-06-21 (×3): 0.125 ug/kg/min via INTRAVENOUS
  Filled 2016-06-17 (×4): qty 100

## 2016-06-17 MED ORDER — INSULIN REGULAR BOLUS VIA INFUSION
0.0000 [IU] | Freq: Three times a day (TID) | INTRAVENOUS | Status: DC
  Filled 2016-06-17: qty 10

## 2016-06-17 MED ORDER — SODIUM CHLORIDE 0.9 % IJ SOLN
INTRAMUSCULAR | Status: DC | PRN
  Administered 2016-06-17: 09:00:00 via TOPICAL

## 2016-06-17 MED ORDER — MORPHINE SULFATE (PF) 4 MG/ML IV SOLN
2.0000 mg | INTRAVENOUS | Status: DC | PRN
  Administered 2016-06-17: 4 mg via INTRAVENOUS

## 2016-06-17 MED ORDER — TRAMADOL HCL 50 MG PO TABS: 50.0000 mg | ORAL_TABLET | ORAL | Status: DC | PRN

## 2016-06-17 SURGICAL SUPPLY — 90 items
ADAPTER CARDIO PERF ANTE/RETRO (ADAPTER) ×3 IMPLANT
BAG DECANTER FOR FLEXI CONT (MISCELLANEOUS) ×3 IMPLANT
BANDAGE ACE 4X5 VEL STRL LF (GAUZE/BANDAGES/DRESSINGS) ×3 IMPLANT
BANDAGE ACE 6X5 VEL STRL LF (GAUZE/BANDAGES/DRESSINGS) ×3 IMPLANT
BANDAGE ELASTIC 4 VELCRO ST LF (GAUZE/BANDAGES/DRESSINGS) ×3 IMPLANT
BANDAGE ELASTIC 6 VELCRO ST LF (GAUZE/BANDAGES/DRESSINGS) ×3 IMPLANT
BASKET HEART (ORDER IN 25'S) (MISCELLANEOUS) ×1
BASKET HEART (ORDER IN 25S) (MISCELLANEOUS) ×2 IMPLANT
BLADE CLIPPER SURG (BLADE) ×3 IMPLANT
BLADE STERNUM SYSTEM 6 (BLADE) ×3 IMPLANT
BLADE SURG 12 STRL SS (BLADE) ×3 IMPLANT
BNDG GAUZE ELAST 4 BULKY (GAUZE/BANDAGES/DRESSINGS) ×3 IMPLANT
CANISTER SUCT 3000ML PPV (MISCELLANEOUS) ×3 IMPLANT
CANNULA GUNDRY RCSP 15FR (MISCELLANEOUS) ×3 IMPLANT
CATH CPB KIT VANTRIGT (MISCELLANEOUS) ×3 IMPLANT
CATH ROBINSON RED A/P 18FR (CATHETERS) ×9 IMPLANT
CATH THORACIC 36FR RT ANG (CATHETERS) ×3 IMPLANT
CRADLE DONUT ADULT HEAD (MISCELLANEOUS) ×3 IMPLANT
DRAIN CHANNEL 32F RND 10.7 FF (WOUND CARE) ×3 IMPLANT
DRAPE CARDIOVASCULAR INCISE (DRAPES) ×1
DRAPE SLUSH/WARMER DISC (DRAPES) ×3 IMPLANT
DRAPE SRG 135X102X78XABS (DRAPES) ×2 IMPLANT
DRSG AQUACEL AG ADV 3.5X14 (GAUZE/BANDAGES/DRESSINGS) ×3 IMPLANT
ELECT BLADE 4.0 EZ CLEAN MEGAD (MISCELLANEOUS) ×3
ELECT BLADE 6.5 EXT (BLADE) ×3 IMPLANT
ELECT CAUTERY BLADE 6.4 (BLADE) ×3 IMPLANT
ELECT REM PT RETURN 9FT ADLT (ELECTROSURGICAL) ×6
ELECTRODE BLDE 4.0 EZ CLN MEGD (MISCELLANEOUS) ×2 IMPLANT
ELECTRODE REM PT RTRN 9FT ADLT (ELECTROSURGICAL) ×4 IMPLANT
FELT TEFLON 1X6 (MISCELLANEOUS) ×3 IMPLANT
GAUZE SPONGE 4X4 12PLY STRL (GAUZE/BANDAGES/DRESSINGS) ×6 IMPLANT
GLOVE BIO SURGEON STRL SZ7.5 (GLOVE) ×9 IMPLANT
GOWN STRL REUS W/ TWL LRG LVL3 (GOWN DISPOSABLE) ×12 IMPLANT
GOWN STRL REUS W/TWL LRG LVL3 (GOWN DISPOSABLE) ×6
HEMOSTAT POWDER SURGIFOAM 1G (HEMOSTASIS) ×9 IMPLANT
HEMOSTAT SURGICEL 2X14 (HEMOSTASIS) ×3 IMPLANT
INSERT FOGARTY XLG (MISCELLANEOUS) IMPLANT
KIT BASIN OR (CUSTOM PROCEDURE TRAY) ×3 IMPLANT
KIT ROOM TURNOVER OR (KITS) ×3 IMPLANT
KIT SUCTION CATH 14FR (SUCTIONS) ×3 IMPLANT
KIT VASOVIEW HEMOPRO VH 3000 (KITS) ×3 IMPLANT
LEAD PACING MYOCARDI (MISCELLANEOUS) ×3 IMPLANT
LINE EXTENSION DELIVERY (MISCELLANEOUS) ×6 IMPLANT
MARKER GRAFT CORONARY BYPASS (MISCELLANEOUS) ×9 IMPLANT
NS IRRIG 1000ML POUR BTL (IV SOLUTION) ×15 IMPLANT
PACK OPEN HEART (CUSTOM PROCEDURE TRAY) ×3 IMPLANT
PAD ARMBOARD 7.5X6 YLW CONV (MISCELLANEOUS) ×6 IMPLANT
PAD ELECT DEFIB RADIOL ZOLL (MISCELLANEOUS) ×3 IMPLANT
PENCIL BUTTON HOLSTER BLD 10FT (ELECTRODE) ×3 IMPLANT
PUNCH AORTIC ROTATE 4.0MM (MISCELLANEOUS) IMPLANT
PUNCH AORTIC ROTATE 4.5MM 8IN (MISCELLANEOUS) ×3 IMPLANT
PUNCH AORTIC ROTATE 5MM 8IN (MISCELLANEOUS) IMPLANT
SET CARDIOPLEGIA MPS 5001102 (MISCELLANEOUS) ×3 IMPLANT
SPONGE GAUZE 4X4 12PLY STER LF (GAUZE/BANDAGES/DRESSINGS) ×6 IMPLANT
SURGIFLO W/THROMBIN 8M KIT (HEMOSTASIS) ×3 IMPLANT
SUT BONE WAX W31G (SUTURE) ×3 IMPLANT
SUT MNCRL AB 4-0 PS2 18 (SUTURE) ×3 IMPLANT
SUT PROLENE 3 0 SH DA (SUTURE) IMPLANT
SUT PROLENE 3 0 SH1 36 (SUTURE) IMPLANT
SUT PROLENE 4 0 RB 1 (SUTURE) ×1
SUT PROLENE 4 0 SH DA (SUTURE) ×3 IMPLANT
SUT PROLENE 4-0 RB1 .5 CRCL 36 (SUTURE) ×2 IMPLANT
SUT PROLENE 5 0 C 1 36 (SUTURE) IMPLANT
SUT PROLENE 6 0 C 1 30 (SUTURE) IMPLANT
SUT PROLENE 6 0 CC (SUTURE) ×9 IMPLANT
SUT PROLENE 8 0 BV175 6 (SUTURE) ×6 IMPLANT
SUT PROLENE BLUE 7 0 (SUTURE) ×6 IMPLANT
SUT SILK  1 MH (SUTURE)
SUT SILK 1 MH (SUTURE) IMPLANT
SUT SILK 2 0 SH CR/8 (SUTURE) IMPLANT
SUT SILK 3 0 SH CR/8 (SUTURE) IMPLANT
SUT STEEL 6MS V (SUTURE) ×6 IMPLANT
SUT STEEL SZ 6 DBL 3X14 BALL (SUTURE) ×3 IMPLANT
SUT VIC AB 1 CTX 36 (SUTURE) ×2
SUT VIC AB 1 CTX36XBRD ANBCTR (SUTURE) ×4 IMPLANT
SUT VIC AB 2-0 CT1 27 (SUTURE) ×1
SUT VIC AB 2-0 CT1 TAPERPNT 27 (SUTURE) ×2 IMPLANT
SUT VIC AB 2-0 CTX 27 (SUTURE) IMPLANT
SUT VIC AB 3-0 X1 27 (SUTURE) IMPLANT
SUTURE E-PAK OPEN HEART (SUTURE) ×3 IMPLANT
SYSTEM SAHARA CHEST DRAIN ATS (WOUND CARE) ×3 IMPLANT
TAPE CLOTH SURG 4X10 WHT LF (GAUZE/BANDAGES/DRESSINGS) ×6 IMPLANT
TOWEL GREEN STERILE (TOWEL DISPOSABLE) ×6 IMPLANT
TOWEL GREEN STERILE FF (TOWEL DISPOSABLE) ×6 IMPLANT
TOWEL OR 17X24 6PK STRL BLUE (TOWEL DISPOSABLE) IMPLANT
TOWEL OR 17X26 10 PK STRL BLUE (TOWEL DISPOSABLE) IMPLANT
TRAY FOLEY IC TEMP SENS 16FR (CATHETERS) ×3 IMPLANT
TUBING INSUFFLATION (TUBING) ×3 IMPLANT
UNDERPAD 30X30 (UNDERPADS AND DIAPERS) ×3 IMPLANT
WATER STERILE IRR 1000ML POUR (IV SOLUTION) ×6 IMPLANT

## 2016-06-17 NOTE — Anesthesia Procedure Notes (Signed)
Central Venous Catheter Insertion Performed by: Roderic Palau, anesthesiologist Start/End3/26/2018 6:48 AM, 06/17/2016 6:58 AM Patient location: Pre-op. Preanesthetic checklist: patient identified, IV checked, site marked, risks and benefits discussed, surgical consent, monitors and equipment checked, pre-op evaluation, timeout performed and anesthesia consent Position: Trendelenburg Lidocaine 1% used for infiltration and patient sedated Hand hygiene performed , maximum sterile barriers used  and Seldinger technique used Catheter size: 8.5 Fr Total catheter length 10. Central line and PA cath was placed.Sheath introducer Swan type:thermodilution PA Cath depth:50 Procedure performed using ultrasound guided technique. Ultrasound Notes:anatomy identified, needle tip was noted to be adjacent to the nerve/plexus identified, no ultrasound evidence of intravascular and/or intraneural injection and image(s) printed for medical record Attempts: 1 Following insertion, line sutured and dressing applied. Post procedure assessment: blood return through all ports, free fluid flow and no air  Patient tolerated the procedure well with no immediate complications.

## 2016-06-17 NOTE — Progress Notes (Addendum)
This note also relates to the following rows which could not be included:  SpO2 - Cannot attach notes to unvalidated device data

## 2016-06-17 NOTE — Anesthesia Procedure Notes (Signed)
Procedure Name: Intubation Date/Time: 06/17/2016 7:45 AM Performed by: Neldon Newport Pre-anesthesia Checklist: Timeout performed, Patient being monitored, Suction available, Patient identified and Emergency Drugs available Patient Re-evaluated:Patient Re-evaluated prior to inductionOxygen Delivery Method: Circle system utilized Preoxygenation: Pre-oxygenation with 100% oxygen Intubation Type: IV induction Ventilation: Mask ventilation without difficulty Laryngoscope Size: Mac and 4 Grade View: Grade II Tube type: Oral Tube size: 8.0 mm Number of attempts: 1 Placement Confirmation: breath sounds checked- equal and bilateral,  positive ETCO2 and ETT inserted through vocal cords under direct vision Secured at: 24 cm Tube secured with: Tape Dental Injury: Teeth and Oropharynx as per pre-operative assessment

## 2016-06-17 NOTE — Progress Notes (Signed)
Patient ID: Drew Harrison, male   DOB: [birth year], 66 y.o.   MRN: [id number] \ EVENING ROUNDS NOTE :     [address]       [place] [address]             [phone]                 Day of Surgery Procedure(s) (LRB): CORONARY ARTERY BYPASS GRAFTING (CABG) x four using left internal mammary artery and right greater saphenous leg vein using endoscope. (N/A) TRANSESOPHAGEAL ECHOCARDIOGRAM (TEE) (N/A)  Total Length of Stay:  LOS: 0 days  BP 113/61   Pulse 86   Temp 98.1 F (36.7 C)   Resp 13   Ht 6' (1.829 m)   Wt 188 lb (85.3 kg)   SpO2 100%   BMI 25.50 kg/m   .Intake/Output      03/26 0701 - 03/27 0700   I.V. (mL/kg) 4435.9 (52)   Blood 683   IV Piggyback 1689   Total Intake(mL/kg) 6807.9 (79.8)   Urine (mL/kg/hr) 1820 (1.7)   Blood 2139 (2)   Chest Tube 210 (0.2)   Total Output 4169   Net +2638.9         . sodium chloride    . [START ON 06/18/2016] sodium chloride    . sodium chloride 20 mL/hr at 06/17/16 1412  . dexmedetomidine (PRECEDEX) IV infusion Stopped (06/17/16 1745)  . insulin (NOVOLIN-R) infusion 0.8 Units/hr (06/17/16 1856)  . lactated ringers 20 mL/hr at 06/17/16 1414  . lactated ringers Stopped (06/17/16 1800)  . lidocaine 2 mg/min (06/17/16 1444)  . milrinone 0.25 mcg/kg/min (06/17/16 1415)  . nitroGLYCERIN    . phenylephrine (NEO-SYNEPHRINE) Adult infusion 18 mcg/min (06/17/16 1700)     Lab Results  Component Value Date   WBC 8.9 06/17/2016   HGB 7.5 (L) 06/17/2016   HCT 21.7 (L) 06/17/2016   PLT 116 (L) 06/17/2016   GLUCOSE 105 (H) 06/17/2016   CHOL 222 (H) 03/30/2016   TRIG 187 (H) 03/30/2016   HDL 47 03/30/2016   LDLCALC 138 (H) 03/30/2016   ALT 23 06/06/2016   AST 20 06/06/2016   NA 137 06/17/2016   K 3.6 06/17/2016   CL 97 (L) 06/17/2016   CREATININE 1.00 06/17/2016   BUN 15 06/17/2016   CO2 26 06/17/2016   INR 1.24 06/17/2016   HGBA1C 13.6 (H) 06/06/2016   Getting unit of blood now Not bleeding Neuro  intact  Grace Isaac MD  Beeper [phone] Office [phone] 06/17/2016 7:23 PM

## 2016-06-17 NOTE — Progress Notes (Signed)
  Echocardiogram Echocardiogram Transesophageal has been performed.  Darlina Sicilian M 06/17/2016, 8:14 AM

## 2016-06-17 NOTE — Progress Notes (Signed)
Cardiac wean started.

## 2016-06-17 NOTE — Anesthesia Postprocedure Evaluation (Addendum)
Anesthesia Post Note  Patient: Drew Harrison  Procedure(s) Performed: Procedure(s) (LRB): CORONARY ARTERY BYPASS GRAFTING (CABG) x four using left internal mammary artery and right greater saphenous leg vein using endoscope. (N/A) TRANSESOPHAGEAL ECHOCARDIOGRAM (TEE) (N/A)  Patient location during evaluation: SICU Anesthesia Type: General Level of consciousness: sedated Pain management: pain level controlled Vital Signs Assessment: post-procedure vital signs reviewed and stable Respiratory status: patient remains intubated per anesthesia plan Cardiovascular status: stable Anesthetic complications: no       Last Vitals:  Vitals:   06/17/16 1415 06/17/16 1430  BP: (!) 114/57 (!) 91/55  Pulse: 80 89  Resp: 14 16  Temp: (!) 34.8 C (!) 34.8 C    Last Pain:  Vitals:   06/17/16 1400  TempSrc: Core (Comment)  PainSc:                  Drew Harrison,W. EDMOND

## 2016-06-17 NOTE — Transfer of Care (Signed)
Immediate Anesthesia Transfer of Care Note  Patient: Drew Harrison  Procedure(s) Performed: Procedure(s): CORONARY ARTERY BYPASS GRAFTING (CABG) x four using left internal mammary artery and right greater saphenous leg vein using endoscope. (N/A) TRANSESOPHAGEAL ECHOCARDIOGRAM (TEE) (N/A)  Patient Location: SICU  Anesthesia Type:General  Level of Consciousness: Patient remains intubated per anesthesia plan  Airway & Oxygen Therapy: Patient remains intubated per anesthesia plan and Patient placed on Ventilator (see vital sign flow sheet for setting)  Post-op Assessment: Report given to RN and Post -op Vital signs reviewed and stable  Post vital signs: Reviewed and stable  Last Vitals:  Vitals:   06/17/16 0631  BP: (!) 143/65  Pulse: 79  Resp: 16  Temp: 36.9 C    Last Pain:  Vitals:   06/17/16 0631  TempSrc: Oral  PainSc:       Patients Stated Pain Goal: 4 (51/83/35 8251)  Complications: No apparent anesthesia complications

## 2016-06-17 NOTE — Progress Notes (Signed)
The patient was examined and preop studies reviewed. There has been no change from the prior exam and the patient is ready for surgery. Plan CABG on M [place]

## 2016-06-17 NOTE — Brief Op Note (Signed)
06/17/2016  11:46 AM  PATIENT:  [name]  66 y.o. male  PRE-OPERATIVE DIAGNOSIS:  CAD  POST-OPERATIVE DIAGNOSIS:  CAD  PROCEDURE:  Procedure(s): CORONARY ARTERY BYPASS GRAFTING (CABG) x four using left internal mammary artery and right greater saphenous leg vein using endoscope. (N/A) TRANSESOPHAGEAL ECHOCARDIOGRAM (TEE) (N/A)  LIMA to LAD SVG to Ramus SVG to PDA SVG to Diag 1  SURGEON:  Surgeon(s) and Role:    * [name], MD - Primary  PHYSICIAN ASSISTANT:  Nicholes Rough, PA-C   ANESTHESIA:   general  EBL:  Total I/O In: 1700 [I.V.:1700] Out: 700 [Urine:700]  BLOOD ADMINISTERED:none  DRAINS: ROUTINE   LOCAL MEDICATIONS USED:  NONE  SPECIMEN:  No Specimen  DISPOSITION OF SPECIMEN:  N/A  COUNTS:  YES  TOURNIQUET:  * No tourniquets in log *  DICTATION: .Dragon Dictation  PLAN OF CARE: Admit to inpatient   PATIENT DISPOSITION:  ICU - intubated and hemodynamically stable.   Delay start of Pharmacological VTE agent (>24hrs) due to surgical blood loss or risk of bleeding: yes

## 2016-06-17 NOTE — Procedures (Signed)
Extubation Procedure Note  Patient Details:   Name: Drew Harrison DOB: [birth year] MRN: [id number]   Airway Documentation:     Evaluation  O2 sats: stable throughout Complications: No apparent complications Patient did tolerate procedure well. Bilateral Breath Sounds: Clear   Yes   NIF - 22, VC 1870, positive cuff leak.  Pt placed on nasal cannula with humidity 4L, no stridor noted, pt able to get 1000 using incentive spirometer.  [name] 06/17/2016, 7:35 PM

## 2016-06-18 ENCOUNTER — Ambulatory Visit: Payer: PPO | Admitting: Thoracic Surgery (Cardiothoracic Vascular Surgery)

## 2016-06-18 ENCOUNTER — Encounter (HOSPITAL_COMMUNITY): Payer: Self-pay | Admitting: Cardiothoracic Surgery

## 2016-06-18 ENCOUNTER — Inpatient Hospital Stay (HOSPITAL_COMMUNITY): Payer: PPO

## 2016-06-18 DIAGNOSIS — J9811 Atelectasis: Secondary | ICD-10-CM | POA: Diagnosis not present

## 2016-06-18 LAB — TYPE AND SCREEN
ABO/RH(D): A POS
Antibody Screen: NEGATIVE
Unit division: 0

## 2016-06-18 LAB — CBC
HCT: 24.5 % — ABNORMAL LOW (ref 39.0–52.0)
HCT: 26.4 % — ABNORMAL LOW (ref 39.0–52.0)
Hemoglobin: 8.4 g/dL — ABNORMAL LOW (ref 13.0–17.0)
Hemoglobin: 9 g/dL — ABNORMAL LOW (ref 13.0–17.0)
MCH: 29.8 pg (ref 26.0–34.0)
MCH: 29.7 pg (ref 26.0–34.0)
MCHC: 34.3 g/dL (ref 30.0–36.0)
MCHC: 34.1 g/dL (ref 30.0–36.0)
MCV: 86.9 fL (ref 78.0–100.0)
MCV: 87.1 fL (ref 78.0–100.0)
Platelets: 98 10*3/uL — ABNORMAL LOW (ref 150–400)
Platelets: 96 10*3/uL — ABNORMAL LOW (ref 150–400)
RBC: 2.82 MIL/uL — ABNORMAL LOW (ref 4.22–5.81)
RBC: 3.03 MIL/uL — ABNORMAL LOW (ref 4.22–5.81)
RDW: 14.7 % (ref 11.5–15.5)
RDW: 15.1 % (ref 11.5–15.5)
WBC: 9.2 10*3/uL (ref 4.0–10.5)
WBC: 10.9 10*3/uL — ABNORMAL HIGH (ref 4.0–10.5)

## 2016-06-18 LAB — PREPARE FRESH FROZEN PLASMA
Unit division: 0
Unit division: 0

## 2016-06-18 LAB — BPAM RBC
Blood Product Expiration Date: 201804142359
ISSUE DATE / TIME: 201803261857
Unit Type and Rh: 6200

## 2016-06-18 LAB — GLUCOSE, CAPILLARY
Glucose-Capillary: 103 mg/dL — ABNORMAL HIGH (ref 65–99)
Glucose-Capillary: 105 mg/dL — ABNORMAL HIGH (ref 65–99)
Glucose-Capillary: 109 mg/dL — ABNORMAL HIGH (ref 65–99)
Glucose-Capillary: 110 mg/dL — ABNORMAL HIGH (ref 65–99)
Glucose-Capillary: 114 mg/dL — ABNORMAL HIGH (ref 65–99)
Glucose-Capillary: 117 mg/dL — ABNORMAL HIGH (ref 65–99)
Glucose-Capillary: 118 mg/dL — ABNORMAL HIGH (ref 65–99)
Glucose-Capillary: 122 mg/dL — ABNORMAL HIGH (ref 65–99)
Glucose-Capillary: 124 mg/dL — ABNORMAL HIGH (ref 65–99)
Glucose-Capillary: 129 mg/dL — ABNORMAL HIGH (ref 65–99)
Glucose-Capillary: 135 mg/dL — ABNORMAL HIGH (ref 65–99)
Glucose-Capillary: 155 mg/dL — ABNORMAL HIGH (ref 65–99)
Glucose-Capillary: 172 mg/dL — ABNORMAL HIGH (ref 65–99)

## 2016-06-18 LAB — CREATININE, SERUM
Creatinine, Ser: 0.99 mg/dL (ref 0.61–1.24)
GFR calc Af Amer: >60 mL/min (ref >60)
GFR calc non Af Amer: >60 mL/min (ref >60)

## 2016-06-18 LAB — BPAM FFP
Blood Product Expiration Date: 201803262359
Blood Product Expiration Date: 201803282359
ISSUE DATE / TIME: 201803261119
ISSUE DATE / TIME: 201803261119
Unit Type and Rh: 2800
Unit Type and Rh: 6200

## 2016-06-18 LAB — POCT I-STAT, CHEM 8
BUN: 16 mg/dL (ref 6–20)
Calcium, Ion: 1.17 mmol/L (ref 1.15–1.40)
Chloride: 100 mmol/L — ABNORMAL LOW (ref 101–111)
Creatinine, Ser: 1 mg/dL (ref 0.61–1.24)
Glucose, Bld: 147 mg/dL — ABNORMAL HIGH (ref 65–99)
HCT: 24 % — ABNORMAL LOW (ref 39.0–52.0)
Hemoglobin: 8.2 g/dL — ABNORMAL LOW (ref 13.0–17.0)
Potassium: 4.1 mmol/L (ref 3.5–5.1)
Sodium: 136 mmol/L (ref 135–145)
TCO2: 25 mmol/L (ref 0–100)

## 2016-06-18 LAB — BASIC METABOLIC PANEL
Anion gap: 9 (ref 5–15)
BUN: 12 mg/dL (ref 6–20)
CO2: 23 mmol/L (ref 22–32)
Calcium: 7.8 mg/dL — ABNORMAL LOW (ref 8.9–10.3)
Chloride: 105 mmol/L (ref 101–111)
Creatinine, Ser: 0.93 mg/dL (ref 0.61–1.24)
GFR calc Af Amer: >60 mL/min (ref >60)
GFR calc non Af Amer: >60 mL/min (ref >60)
Glucose, Bld: 123 mg/dL — ABNORMAL HIGH (ref 65–99)
Potassium: 3.8 mmol/L (ref 3.5–5.1)
Sodium: 137 mmol/L (ref 135–145)

## 2016-06-18 LAB — MAGNESIUM
Magnesium: 2.4 mg/dL (ref 1.7–2.4)
Magnesium: 2.3 mg/dL (ref 1.7–2.4)

## 2016-06-18 MED ORDER — FE FUMARATE-B12-VIT C-FA-IFC PO CAPS
1.0000 | ORAL_CAPSULE | Freq: Two times a day (BID) | ORAL | Status: DC
  Administered 2016-06-18 – 2016-06-26 (×17): 1 via ORAL
  Filled 2016-06-18 (×18): qty 1

## 2016-06-18 MED ORDER — KETOROLAC TROMETHAMINE 15 MG/ML IJ SOLN
15.0000 mg | Freq: Once | INTRAMUSCULAR | Status: AC
Start: 2016-06-18 — End: 2016-06-18
  Administered 2016-06-18: 15 mg via INTRAVENOUS

## 2016-06-18 MED ORDER — FUROSEMIDE 10 MG/ML IJ SOLN
20.0000 mg | Freq: Two times a day (BID) | INTRAMUSCULAR | Status: DC
  Administered 2016-06-18 – 2016-06-19 (×2): 20 mg via INTRAVENOUS
  Filled 2016-06-18 (×2): qty 2

## 2016-06-18 MED ORDER — KETOROLAC TROMETHAMINE 15 MG/ML IJ SOLN
INTRAMUSCULAR | Status: AC
  Administered 2016-06-18: 15 mg via INTRAVENOUS
  Filled 2016-06-18: qty 1

## 2016-06-18 MED ORDER — INSULIN ASPART 100 UNIT/ML ~~LOC~~ SOLN
3.0000 [IU] | Freq: Three times a day (TID) | SUBCUTANEOUS | Status: DC
  Administered 2016-06-18 – 2016-06-26 (×17): 3 [IU] via SUBCUTANEOUS

## 2016-06-18 MED ORDER — INSULIN DETEMIR 100 UNIT/ML ~~LOC~~ SOLN
12.0000 [IU] | Freq: Two times a day (BID) | SUBCUTANEOUS | Status: DC
  Filled 2016-06-18: qty 0.12

## 2016-06-18 MED ORDER — KETOROLAC TROMETHAMINE 15 MG/ML IJ SOLN
15.0000 mg | Freq: Once | INTRAMUSCULAR | Status: AC
  Administered 2016-06-18: 15 mg via INTRAVENOUS
  Filled 2016-06-18: qty 1

## 2016-06-18 MED ORDER — ORAL CARE MOUTH RINSE
15.0000 mL | Freq: Two times a day (BID) | OROMUCOSAL | Status: DC
  Administered 2016-06-18 – 2016-06-26 (×7): 15 mL via OROMUCOSAL

## 2016-06-18 MED ORDER — INSULIN ASPART 100 UNIT/ML ~~LOC~~ SOLN
0.0000 [IU] | SUBCUTANEOUS | Status: DC
  Administered 2016-06-18: 2 [IU] via SUBCUTANEOUS
  Administered 2016-06-18: 4 [IU] via SUBCUTANEOUS
  Administered 2016-06-19: 2 [IU] via SUBCUTANEOUS
  Administered 2016-06-19 (×3): 4 [IU] via SUBCUTANEOUS
  Administered 2016-06-19: 2 [IU] via SUBCUTANEOUS

## 2016-06-18 MED ORDER — INSULIN DETEMIR 100 UNIT/ML ~~LOC~~ SOLN
12.0000 [IU] | Freq: Two times a day (BID) | SUBCUTANEOUS | Status: DC
  Administered 2016-06-18 – 2016-06-21 (×7): 12 [IU] via SUBCUTANEOUS
  Filled 2016-06-18 (×8): qty 0.12

## 2016-06-18 MED FILL — Mannitol IV Soln 20%: INTRAVENOUS | Qty: 500 | Status: AC

## 2016-06-18 MED FILL — Heparin Sodium (Porcine) Inj 1000 Unit/ML: INTRAMUSCULAR | Qty: 30 | Status: AC

## 2016-06-18 MED FILL — Potassium Chloride Inj 2 mEq/ML: INTRAVENOUS | Qty: 40 | Status: AC

## 2016-06-18 MED FILL — Lidocaine HCl IV Inj 20 MG/ML: INTRAVENOUS | Qty: 5 | Status: AC

## 2016-06-18 MED FILL — Magnesium Sulfate Inj 50%: INTRAMUSCULAR | Qty: 10 | Status: AC

## 2016-06-18 MED FILL — Sodium Bicarbonate IV Soln 8.4%: INTRAVENOUS | Qty: 50 | Status: AC

## 2016-06-18 MED FILL — Sodium Chloride IV Soln 0.9%: INTRAVENOUS | Qty: 2000 | Status: AC

## 2016-06-18 MED FILL — Electrolyte-R (PH 7.4) Solution: INTRAVENOUS | Qty: 4000 | Status: AC

## 2016-06-18 MED FILL — Heparin Sodium (Porcine) Inj 1000 Unit/ML: INTRAMUSCULAR | Qty: 10 | Status: AC

## 2016-06-18 NOTE — Progress Notes (Signed)
EKG CRITICAL VALUE     12 lead EKG performed.  Critical value noted.  Naida Sleight, RN notified.   Neva Seat, CCT 06/18/2016 7:57 AM

## 2016-06-18 NOTE — Progress Notes (Signed)
1 Day Post-Op Procedure(s) (LRB): CORONARY ARTERY BYPASS GRAFTING (CABG) x four using left internal mammary artery and right greater saphenous leg vein using endoscope. (N/A) TRANSESOPHAGEAL ECHOCARDIOGRAM (TEE) (N/A) Subjective: Extubated A-paced Stable hemodynamics  Objective: Vital signs in last 24 hours: Temp:  [94.6 F (34.8 C)-98.8 F (37.1 C)] 98.1 F (36.7 C) (03/27 0630) Pulse Rate:  [80-92] 92 (03/27 0700) Cardiac Rhythm: Atrial paced (03/27 0200) Resp:  [0-28] 17 (03/27 0700) BP: (78-128)/(37-68) 117/54 (03/27 0700) SpO2:  [94 %-100 %] 97 % (03/27 0630) Arterial Line BP: (71-161)/(31-62) 146/52 (03/27 0700) FiO2 (%):  [40 %-50 %] 40 % (03/26 1758) Weight:  [197 lb 15.6 oz (89.8 kg)] 197 lb 15.6 oz (89.8 kg) (03/27 0500)  Hemodynamic parameters for last 24 hours: PAP: (15-38)/(9-21) 38/21 CO:  [5.8 L/min-7.5 L/min] 7.3 L/min CI:  [2.8 L/min/m2-3.6 L/min/m2] 3.5 L/min/m2  Intake/Output from previous day: 03/26 0701 - 03/27 0700 In: 8845.3 [I.V.:5638.3; Blood:1018; IV VELFYBOFB:5102] Out: 5852 [Urine:2385; Blood:2139; Chest Tube:590] Intake/Output this shift: No intake/output data recorded.       Exam    General- alert and comfortable   Lungs- clear without rales, wheezes   Cor- regular rate and rhythm, no murmur , gallop   Abdomen- soft, non-tender   Extremities - warm, non-tender, minimal edema   Neuro- oriented, appropriate, no focal weakness   Lab Results:  Recent Labs  06/17/16 1753 06/17/16 2211 06/18/16 0316  WBC 8.9  --  9.2  HGB 7.5* 7.5* 8.4*  HCT 21.7* 22.0* 24.5*  PLT 116*  --  98*   BMET:  Recent Labs  06/17/16 0613  06/17/16 2211 06/18/16 0316  NA 135  < > 138 137  K 4.2  < > 4.0 3.8  CL 97*  < > 103 105  CO2 26  --   --  23  GLUCOSE 213*  < > 146* 123*  BUN 19  < > 13 12  CREATININE 1.30*  < > 0.90 0.93  CALCIUM 9.5  --   --  7.8*  < > = values in this interval not displayed.  PT/INR:  Recent Labs  06/17/16 1753   LABPROT 15.7*  INR 1.24   ABG    Component Value Date/Time   PHART 7.368 06/17/2016 2207   HCO3 22.6 06/17/2016 2207   TCO2 24 06/17/2016 2211   ACIDBASEDEF 3.0 (H) 06/17/2016 2207   O2SAT 98.0 06/17/2016 2207   CBG (last 3)   Recent Labs  06/18/16 0505 06/18/16 0600 06/18/16 0702  GLUCAP 109* 114* 117*    Assessment/Plan: S/P Procedure(s) (LRB): CORONARY ARTERY BYPASS GRAFTING (CABG) x four using left internal mammary artery and right greater saphenous leg vein using endoscope. (N/A) TRANSESOPHAGEAL ECHOCARDIOGRAM (TEE) (N/A) Mobilize Diabetes control d/c tubes/lines See progression orders wean milrinone and start lasix later when off neo   LOS: 1 day    Tharon Aquas Trigt III 06/18/2016

## 2016-06-18 NOTE — Op Note (Signed)
Drew Harrison, Drew Harrison NO.:  [id number]  MEDICAL RECORD NO.:  [id number]  LOCATION:                               FACILITY:  [facility]  PHYSICIAN:  [name], M.D.  DATE OF BIRTH:  [birth year]  DATE OF PROCEDURE:  06/17/2016 DATE OF DISCHARGE:                              OPERATIVE REPORT   OPERATION: 1. Coronary artery bypass grafting x4 (left internal mammary artery to     left anterior descending, saphenous vein graft to diagonal,     saphenous vein graft to ramus intermediate, saphenous vein graft to     posterior descending). 2. Endoscopic harvest of right leg greater saphenous vein.  SURGEON:  [name], MD.  ASSISTANT:  Nicholes Rough, PA-C.  PREOPERATIVE DIAGNOSIS:  Severe 3-vessel coronary artery disease, moderate left ventricular dysfunction, and diabetes, poorly controlled.  POSTOPERATIVE DIAGNOSIS:  Severe 3-vessel coronary artery disease, moderate left ventricular dysfunction, and diabetes, poorly controlled.  ANESTHESIA:  General.  INDICATIONS:  The patient is a 66 year old Caucasian male, reformed smoker, who was recently noted to have symptoms of angina and symptoms of heart failure and underwent Cardiology evaluation by Dr. Einar Gip. Echocardiogram showed reduced LV ejection fraction of 35%.  He had mild MR.  Cardiac catheterization demonstrated severe multivessel coronary artery disease in a diabetic type pattern.  The patient was felt to be a candidate for surgical revascularization and I saw the patient in consultation at the office.  After reviewing the cardiac cath and echo images and counseling the patient, I recommended surgical coronary revascularization.  I discussed the procedure of CABG in detail including the use of general anesthesia and location of the surgical incisions, the use of general anesthesia, the use of cardiopulmonary bypass, and the expected postoperative hospital recovery.  I reviewed the risks to him of the  operation including risks of MI, stroke, bleeding, infection, postoperative pulmonary problems including pleural effusion and death.  After reviewing these issues, he demonstrated his understanding and agreed to proceed with surgery under what I felt was an informed consent.  OPERATIVE FINDINGS: 1. Adequate conduit. 2. Severe diffuse coronary artery disease in a diabetic pattern. 3. Post pump coagulopathy requiring FFP transfusion.  OPERATIVE PROCEDURE:  The patient was brought to the OR, placed supine on the operating table where general anesthesia was induced under invasive hemodynamic monitoring.  A proper time-out was performed.  A chest incision was made and sternotomy was performed.  The vein was harvested endoscopically from right leg with endoscopic endo vein technique.  The left internal mammary artery was harvested as a pedicle graft from its origin at the subclavian vessel.  The vessel was 1.5 mm with good flow.  The sternal retractor was placed and pericardium was opened and suspended.  The heart was enlarged.  The coronaries were heavily calcified.  There is severe inferior and inferolateral scarring. Pursestrings were placed in ascending aorta and right atrium and heparin was administered.  When the ACT was documented as being therapeutic, the patient was cannulated and placed on cardiopulmonary bypass.  The coronaries were identified for grafting.  The mammary artery and vein grafts were prepared for the distal anastomoses and cardioplegia cannulas were  placed for both antegrade and retrograde cold blood cardioplegia.  The patient was cooled at 32 degrees and aortic crossclamp was applied.  One liter of cold blood cardioplegia was delivered in split doses between the antegrade aortic and retrograde coronary sinus catheters.  There was good cardioplegic arrest and septal temperature dropped less than 14 degrees.  Cardioplegia was delivered every 20 minutes.  The distal  coronary anastomoses were performed.  First distal anastomosis was to the posterior descending.  This was totally occluded proximally.  It was heavily diseased.  It was 1.5 mm in diameter.  A reverse saphenous vein was sewn end-to-side with running 7-0 Prolene with good flow through the graft.  Cardioplegia was redosed.  The second distal anastomosis was to the ramus intermediate.  This was 1.5-mm vessel proximal 80-90% stenosis.  A reverse saphenous vein was sewn end-to-side with running 7-0 Prolene with good flow through the graft.  Cardioplegia was redosed.  The third distal anastomosis was to the diagonal branch to LAD.  This was a smaller 1.2-mm vessel with proximal ostial 90% stenosis.  Reverse saphenous vein of smaller caliber was sewn end-to-side with running 7-0 Prolene with good flow through the graft.  Cardioplegia was redosed.  The fourth distal anastomosis was then placed to the distal LAD.  The LAD was heavily calcified diffusely.  The left IMA pedicle was brought through an opening in the left lateral pericardium, was brought down onto the LAD and sewn end-to-side with a running 8-0 Prolene.  There was good flow through the anastomosis after briefly releasing the pedicle bulldog on the mammary pedicle.  The bulldog was reapplied and the pedicle was secured to the epicardium with 6-0 Prolene.  Cardioplegia was redosed.  The crossclamp was still in place, 3 proximal vein anastomoses were performed in the ascending aorta with a 4.5-mm punch running 6-0 Prolene.  Prior to tying down the final proximal anastomosis, air was vented from the coronaries with a dose of retrograde warm blood cardioplegia.  The crossclamp was removed.  The heart resumed a spontaneous rhythm.  The vein grafts were de-aired and opened and each had good flow.  Hemostasis was documented at the proximal and distal anastomoses.  The cardioplegia cannulas were removed.  Temporary pacing wires were  placed.  The patient was rewarmed and reperfused.  The lungs were expanded, the ventilator was resumed. When the patient was adequately rewarmed and reperfused, he was weaned from cardiopulmonary bypass with low-dose milrinone.  Total cardiac function and contractility appeared to be improved from preoperative status.  Protamine was administered without adverse reaction.  The patient remained hemodynamically stable.  Echo showed mild MR as was seen preop.  After reversal of the heparin with protamine, the patient still had diffuse coagulopathy and was treated with FFP with improved coagulation function.  The superior pericardial fat was closed over the aorta and vein grafts. Anterior mediastinal and left pleural chest tubes were placed and brought out through separate incisions.  The sternum was closed with a wire.  The pectoralis fascia was closed with a running #1 Vicryl.  The subcutaneous and skin layers were closed in running Vicryl and sterile dressings were applied.  Total cardiopulmonary bypass time was 120 minutes.     [name], M.D.   ______________________________ [name], M.D.    PV/MEDQ  D:  06/18/2016  T:  06/18/2016  Job:  [id number]  cc:   Laverda Page, MD

## 2016-06-18 NOTE — Progress Notes (Addendum)
[facility] BRIEF SICU PROGRESS NOTE  1 Day Post-Op  S/P Procedure(s) (LRB): CORONARY ARTERY BYPASS GRAFTING (CABG) x four using left internal mammary artery and right greater saphenous leg vein using endoscope. (N/A) TRANSESOPHAGEAL ECHOCARDIOGRAM (TEE) (N/A)   Stable day NSR w/ stable BP off Neo on low dose milrinone Breathing comfortably w/ O2 sats 93-98% UOP low but adequate - just got a dose of IV lasix Labs and CBG's okay  Plan: Continue current plan  Rexene Alberts, MD 06/18/2016 7:11 PM

## 2016-06-18 NOTE — Op Note (Deleted)
  The note originally documented on this encounter has been moved the the encounter in which it belongs.  

## 2016-06-19 ENCOUNTER — Inpatient Hospital Stay (HOSPITAL_COMMUNITY): Payer: PPO

## 2016-06-19 DIAGNOSIS — I255 Ischemic cardiomyopathy: Secondary | ICD-10-CM | POA: Diagnosis not present

## 2016-06-19 DIAGNOSIS — E1165 Type 2 diabetes mellitus with hyperglycemia: Secondary | ICD-10-CM | POA: Diagnosis not present

## 2016-06-19 DIAGNOSIS — D62 Acute posthemorrhagic anemia: Secondary | ICD-10-CM | POA: Diagnosis not present

## 2016-06-19 DIAGNOSIS — I639 Cerebral infarction, unspecified: Secondary | ICD-10-CM | POA: Diagnosis not present

## 2016-06-19 DIAGNOSIS — Z8673 Personal history of transient ischemic attack (TIA), and cerebral infarction without residual deficits: Secondary | ICD-10-CM | POA: Diagnosis not present

## 2016-06-19 DIAGNOSIS — D689 Coagulation defect, unspecified: Secondary | ICD-10-CM | POA: Diagnosis not present

## 2016-06-19 DIAGNOSIS — J9811 Atelectasis: Secondary | ICD-10-CM | POA: Diagnosis not present

## 2016-06-19 DIAGNOSIS — Z87891 Personal history of nicotine dependence: Secondary | ICD-10-CM | POA: Diagnosis not present

## 2016-06-19 DIAGNOSIS — I2510 Atherosclerotic heart disease of native coronary artery without angina pectoris: Secondary | ICD-10-CM | POA: Diagnosis not present

## 2016-06-19 DIAGNOSIS — Z4682 Encounter for fitting and adjustment of non-vascular catheter: Secondary | ICD-10-CM | POA: Diagnosis not present

## 2016-06-19 DIAGNOSIS — I110 Hypertensive heart disease with heart failure: Secondary | ICD-10-CM | POA: Diagnosis not present

## 2016-06-19 DIAGNOSIS — I509 Heart failure, unspecified: Secondary | ICD-10-CM | POA: Diagnosis not present

## 2016-06-19 DIAGNOSIS — J90 Pleural effusion, not elsewhere classified: Secondary | ICD-10-CM | POA: Diagnosis not present

## 2016-06-19 LAB — CBC
HCT: 25.6 % — ABNORMAL LOW (ref 39.0–52.0)
Hemoglobin: 8.8 g/dL — ABNORMAL LOW (ref 13.0–17.0)
MCH: 30.1 pg (ref 26.0–34.0)
MCHC: 34.4 g/dL (ref 30.0–36.0)
MCV: 87.7 fL (ref 78.0–100.0)
Platelets: 96 10*3/uL — ABNORMAL LOW (ref 150–400)
RBC: 2.92 MIL/uL — ABNORMAL LOW (ref 4.22–5.81)
RDW: 15.1 % (ref 11.5–15.5)
WBC: 10 10*3/uL (ref 4.0–10.5)

## 2016-06-19 LAB — POCT I-STAT, CHEM 8
BUN: 19 mg/dL (ref 6–20)
Calcium, Ion: 1.07 mmol/L — ABNORMAL LOW (ref 1.15–1.40)
Chloride: 99 mmol/L — ABNORMAL LOW (ref 101–111)
Creatinine, Ser: 0.9 mg/dL (ref 0.61–1.24)
Glucose, Bld: 162 mg/dL — ABNORMAL HIGH (ref 65–99)
HCT: 24 % — ABNORMAL LOW (ref 39.0–52.0)
Hemoglobin: 8.2 g/dL — ABNORMAL LOW (ref 13.0–17.0)
Potassium: 3.7 mmol/L (ref 3.5–5.1)
Sodium: 134 mmol/L — ABNORMAL LOW (ref 135–145)
TCO2: 25 mmol/L (ref 0–100)

## 2016-06-19 LAB — GLUCOSE, CAPILLARY
Glucose-Capillary: 113 mg/dL — ABNORMAL HIGH (ref 65–99)
Glucose-Capillary: 158 mg/dL — ABNORMAL HIGH (ref 65–99)
Glucose-Capillary: 124 mg/dL — ABNORMAL HIGH (ref 65–99)
Glucose-Capillary: 111 mg/dL — ABNORMAL HIGH (ref 65–99)
Glucose-Capillary: 192 mg/dL — ABNORMAL HIGH (ref 65–99)
Glucose-Capillary: 161 mg/dL — ABNORMAL HIGH (ref 65–99)

## 2016-06-19 LAB — BASIC METABOLIC PANEL
Anion gap: 8 (ref 5–15)
BUN: 17 mg/dL (ref 6–20)
CO2: 25 mmol/L (ref 22–32)
Calcium: 7.8 mg/dL — ABNORMAL LOW (ref 8.9–10.3)
Chloride: 102 mmol/L (ref 101–111)
Creatinine, Ser: 0.96 mg/dL (ref 0.61–1.24)
GFR calc Af Amer: >60 mL/min (ref >60)
GFR calc non Af Amer: >60 mL/min (ref >60)
Glucose, Bld: 132 mg/dL — ABNORMAL HIGH (ref 65–99)
Potassium: 3.8 mmol/L (ref 3.5–5.1)
Sodium: 135 mmol/L (ref 135–145)

## 2016-06-19 LAB — COOXEMETRY PANEL
Carboxyhemoglobin: 1.6 % — ABNORMAL HIGH (ref 0.5–1.5)
Methemoglobin: 1 % (ref 0.0–1.5)
O2 Saturation: 54.5 %
Total hemoglobin: 9.6 g/dL — ABNORMAL LOW (ref 12.0–16.0)

## 2016-06-19 MED ORDER — FUROSEMIDE 10 MG/ML IJ SOLN
40.0000 mg | Freq: Once | INTRAMUSCULAR | Status: AC
  Administered 2016-06-19: 40 mg via INTRAVENOUS
  Filled 2016-06-19: qty 4

## 2016-06-19 MED ORDER — METOLAZONE 5 MG PO TABS
5.0000 mg | ORAL_TABLET | Freq: Once | ORAL | Status: AC
  Administered 2016-06-19: 5 mg via ORAL
  Filled 2016-06-19: qty 1

## 2016-06-19 MED ORDER — FUROSEMIDE 10 MG/ML IJ SOLN
40.0000 mg | Freq: Every day | INTRAMUSCULAR | Status: DC
  Administered 2016-06-19 – 2016-06-23 (×5): 40 mg via INTRAVENOUS
  Filled 2016-06-19 (×5): qty 4

## 2016-06-19 NOTE — Progress Notes (Signed)
EKG CRITICAL VALUE     12 lead EKG performed.  Critical value noted.  Herbie Baltimore, RN notified.   Martie Lee, CCT 06/19/2016 7:04 AM

## 2016-06-19 NOTE — Progress Notes (Signed)
CT surgery p.m. Rounds  Patient feels better this p.m. ambulated in the hallway twice Improving diuresis with Lasix Sinus rhythm Palmar he status improved, oxygen saturation   94%

## 2016-06-19 NOTE — Progress Notes (Signed)
2 Days Post-Op Procedure(s) (LRB): CORONARY ARTERY BYPASS GRAFTING (CABG) x four using left internal mammary artery and right greater saphenous leg vein using endoscope. (N/A) TRANSESOPHAGEAL ECHOCARDIOGRAM (TEE) (N/A) Subjective: Ischemic cardiomyopathy, class Harrison CHF Postop day 2 CABG Poorly controlled diabetes, preoperative hemoglobin A1c 13.4 COPD history of smoking Perioperative coagulopathy with postoperative expected blood loss anemia  Postop pain is improved Needs oxygen to maintain saturation greater than 90% Walking hallway Hemoglobin stable 8.2 sinus rhythm Still with fluid retention, increasing doses of diuretics required Low-dose milrinone, co-OX 55% saturation   Objective: Vital signs in last 24 hours: Temp:  [98 F (36.7 C)-99 F (37.2 C)] 98 F (36.7 C) (03/28 0700) Pulse Rate:  [74-101] 101 (03/28 0917) Cardiac Rhythm: Normal sinus rhythm (03/28 0400) Resp:  [0-24] 0 (03/28 0700) BP: (91-144)/(51-75) 144/75 (03/28 0917) SpO2:  [89 %-99 %] 93 % (03/28 0700) Weight:  [202 lb 8 oz (91.9 kg)] 202 lb 8 oz (91.9 kg) (03/28 0500)  Hemodynamic parameters for last 24 hours:    Intake/Output from previous day: 03/27 0701 - 03/28 0700 In: 741.2 [I.V.:241.2; IV Piggyback:500] Out: 820 [Urine:630; Chest Tube:190] Intake/Output this shift: Total I/O In: -  Out: 70 [Urine:30; Chest Tube:40]       Exam    General- alert and comfortable   Lungs- clear without rales, wheezes   Cor- regular rate and rhythm, no murmur , gallop   Abdomen- soft, non-tender   Extremities - warm, non-tender, minimal edema   Neuro- oriented, appropriate, no focal weakness   Lab Results:  Recent Labs  06/18/16 1730 06/18/16 1748 06/19/16 0355  WBC 10.9*  --  10.0  HGB 9.0* 8.2* 8.8*  HCT 26.4* 24.0* 25.6*  PLT 96*  --  96*   BMET:  Recent Labs  06/18/16 0316  06/18/16 1748 06/19/16 0355  NA 137  --  136 135  K 3.8  --  4.1 3.8  CL 105  --  100* 102  CO2 23  --   --   25  GLUCOSE 123*  --  147* 132*  BUN 12  --  16 17  CREATININE 0.93  < > 1.00 0.96  CALCIUM 7.8*  --   --  7.8*  < > = values in this interval not displayed.  PT/INR:  Recent Labs  06/17/16 1753  LABPROT 15.7*  INR 1.24   ABG    Component Value Date/Time   PHART 7.368 06/17/2016 2207   HCO3 22.6 06/17/2016 2207   TCO2 25 06/18/2016 1748   ACIDBASEDEF 3.0 (H) 06/17/2016 2207   O2SAT 54.5 06/19/2016 0945   CBG (last 3)   Recent Labs  06/18/16 2337 06/19/16 0347 06/19/16 0852  GLUCAP 155* 124* 111*    Assessment/Plan: S/P Procedure(s) (LRB): CORONARY ARTERY BYPASS GRAFTING (CABG) x four using left internal mammary artery and right greater saphenous leg vein using endoscope. (N/A) TRANSESOPHAGEAL ECHOCARDIOGRAM (TEE) (N/A) Mobilize Diuresis Diabetes control Increased diuretics to IV Lasix with metolazone Keep in ICU, continue low-dose milrinone to treat postop fluid retention, ischemic cardiomyopathy  LOS: 2 days    Drew Harrison 06/19/2016

## 2016-06-19 NOTE — Care Management Note (Signed)
Case Management Note  Patient Details  Name: Drew Harrison MRN: [id number] Date of Birth: 02-27-1951  Subjective/Objective:                 Patient admitted with CABG x 4 3/26. Increased diuretics to IV Lasix with metolazone. Keep in ICU, continue low-dose milrinone to treat postop fluid retention, ischemic cardiomyopathy.    Action/Plan:   Expected Discharge Date:                  Expected Discharge Plan:  [place]  In-House Referral:     Discharge planning Services  CM Consult  Post Acute Care Choice:    Choice offered to:     DME Arranged:    DME Agency:     HH Arranged:    [place] Agency:     Status of Service:  In process, will continue to follow  If discussed at Long Length of Stay Meetings, dates discussed:    Additional Comments:  Carles Collet, RN 06/19/2016, 3:23 PM

## 2016-06-20 ENCOUNTER — Inpatient Hospital Stay (HOSPITAL_COMMUNITY): Payer: PPO

## 2016-06-20 DIAGNOSIS — R0602 Shortness of breath: Secondary | ICD-10-CM | POA: Diagnosis not present

## 2016-06-20 LAB — GLUCOSE, CAPILLARY
Glucose-Capillary: 116 mg/dL — ABNORMAL HIGH (ref 65–99)
Glucose-Capillary: 177 mg/dL — ABNORMAL HIGH (ref 65–99)
Glucose-Capillary: 90 mg/dL (ref 65–99)
Glucose-Capillary: 89 mg/dL (ref 65–99)
Glucose-Capillary: 149 mg/dL — ABNORMAL HIGH (ref 65–99)
Glucose-Capillary: 201 mg/dL — ABNORMAL HIGH (ref 65–99)
Glucose-Capillary: 172 mg/dL — ABNORMAL HIGH (ref 65–99)

## 2016-06-20 LAB — COMPREHENSIVE METABOLIC PANEL
ALT: 19 U/L (ref 17–63)
AST: 20 U/L (ref 15–41)
Albumin: 2.4 g/dL — ABNORMAL LOW (ref 3.5–5.0)
Alkaline Phosphatase: 63 U/L (ref 38–126)
Anion gap: 7 (ref 5–15)
BUN: 19 mg/dL (ref 6–20)
CO2: 26 mmol/L (ref 22–32)
Calcium: 7.5 mg/dL — ABNORMAL LOW (ref 8.9–10.3)
Chloride: 100 mmol/L — ABNORMAL LOW (ref 101–111)
Creatinine, Ser: 0.95 mg/dL (ref 0.61–1.24)
GFR calc Af Amer: >60 mL/min (ref >60)
GFR calc non Af Amer: >60 mL/min (ref >60)
Glucose, Bld: 108 mg/dL — ABNORMAL HIGH (ref 65–99)
Potassium: 3.2 mmol/L — ABNORMAL LOW (ref 3.5–5.1)
Sodium: 133 mmol/L — ABNORMAL LOW (ref 135–145)
Total Bilirubin: 1.1 mg/dL (ref 0.3–1.2)
Total Protein: 4.8 g/dL — ABNORMAL LOW (ref 6.5–8.1)

## 2016-06-20 LAB — CBC
HCT: 24.4 % — ABNORMAL LOW (ref 39.0–52.0)
Hemoglobin: 8.3 g/dL — ABNORMAL LOW (ref 13.0–17.0)
MCH: 29.2 pg (ref 26.0–34.0)
MCHC: 34 g/dL (ref 30.0–36.0)
MCV: 85.9 fL (ref 78.0–100.0)
Platelets: 112 10*3/uL — ABNORMAL LOW (ref 150–400)
RBC: 2.84 MIL/uL — ABNORMAL LOW (ref 4.22–5.81)
RDW: 14.7 % (ref 11.5–15.5)
WBC: 7.4 10*3/uL (ref 4.0–10.5)

## 2016-06-20 LAB — COOXEMETRY PANEL
Carboxyhemoglobin: 1.5 % (ref 0.5–1.5)
Methemoglobin: 1.1 % (ref 0.0–1.5)
O2 Saturation: 58.3 %
Total hemoglobin: 11 g/dL — ABNORMAL LOW (ref 12.0–16.0)

## 2016-06-20 MED ORDER — SODIUM CHLORIDE 0.9% FLUSH: 3.0000 mL | INTRAVENOUS | Status: DC | PRN

## 2016-06-20 MED ORDER — SODIUM CHLORIDE 0.9 % IV SOLN: 250.0000 mL | INTRAVENOUS | Status: DC | PRN

## 2016-06-20 MED ORDER — POTASSIUM CHLORIDE CRYS ER 20 MEQ PO TBCR
20.0000 meq | EXTENDED_RELEASE_TABLET | Freq: Two times a day (BID) | ORAL | Status: DC
  Administered 2016-06-20 – 2016-06-23 (×8): 20 meq via ORAL
  Filled 2016-06-20 (×8): qty 1

## 2016-06-20 MED ORDER — SODIUM CHLORIDE 0.9% FLUSH
3.0000 mL | Freq: Two times a day (BID) | INTRAVENOUS | Status: DC
  Administered 2016-06-20 – 2016-06-26 (×12): 3 mL via INTRAVENOUS

## 2016-06-20 MED ORDER — MOVING RIGHT ALONG BOOK
Freq: Once | Status: AC
  Administered 2016-06-20: 1
  Filled 2016-06-20 (×2): qty 1

## 2016-06-20 MED ORDER — INSULIN ASPART 100 UNIT/ML ~~LOC~~ SOLN
0.0000 [IU] | Freq: Three times a day (TID) | SUBCUTANEOUS | Status: DC
  Administered 2016-06-20: 2 [IU] via SUBCUTANEOUS
  Administered 2016-06-20: 8 [IU] via SUBCUTANEOUS
  Administered 2016-06-20 – 2016-06-21 (×2): 4 [IU] via SUBCUTANEOUS
  Administered 2016-06-21 (×2): 8 [IU] via SUBCUTANEOUS
  Administered 2016-06-21 – 2016-06-22 (×2): 2 [IU] via SUBCUTANEOUS
  Administered 2016-06-22: 5 [IU] via SUBCUTANEOUS
  Administered 2016-06-22: 8 [IU] via SUBCUTANEOUS
  Administered 2016-06-23 – 2016-06-24 (×4): 4 [IU] via SUBCUTANEOUS
  Administered 2016-06-24: 2 [IU] via SUBCUTANEOUS
  Administered 2016-06-25 (×2): 4 [IU] via SUBCUTANEOUS

## 2016-06-20 MED ORDER — GUAIFENESIN ER 600 MG PO TB12
600.0000 mg | ORAL_TABLET | Freq: Two times a day (BID) | ORAL | Status: DC
  Administered 2016-06-20 – 2016-06-26 (×13): 600 mg via ORAL
  Filled 2016-06-20 (×13): qty 1

## 2016-06-20 MED ORDER — POTASSIUM CHLORIDE 2 MEQ/ML IV SOLN
30.0000 meq | Freq: Once | INTRAVENOUS | Status: AC
  Administered 2016-06-20: 30 meq via INTRAVENOUS
  Filled 2016-06-20: qty 15

## 2016-06-20 NOTE — Progress Notes (Signed)
3 Days Post-Op Procedure(s) (LRB): CORONARY ARTERY BYPASS GRAFTING (CABG) x four using left internal mammary artery and right greater saphenous leg vein using endoscope. (N/A) TRANSESOPHAGEAL ECHOCARDIOGRAM (TEE) (N/A) Subjective: Improved- less edema, co-ox better  nsr Plan tx to step down pulm status improved Objective: Vital signs in last 24 hours: Temp:  [98.2 F (36.8 C)-99.2 F (37.3 C)] 98.2 F (36.8 C) (03/29 0400) Pulse Rate:  [90-106] 92 (03/29 0800) Cardiac Rhythm: Normal sinus rhythm (03/29 0800) Resp:  [16-29] 19 (03/29 0800) BP: (85-175)/(48-102) 121/58 (03/29 0800) SpO2:  [87 %-99 %] 97 % (03/29 0800) Weight:  [199 lb 11.8 oz (90.6 kg)] 199 lb 11.8 oz (90.6 kg) (03/29 0500)  Hemodynamic parameters for last 24 hours:    Intake/Output from previous day: 03/28 0701 - 03/29 0700 In: 516.8 [P.O.:200; I.V.:316.8] Out: 2220 [Urine:2150; Chest Tube:70] Intake/Output this shift: Total I/O In: -  Out: 180 [Urine:180]       Exam    General- alert and comfortable   Lungs- clear without rales, wheezes   Cor- regular rate and rhythm, no murmur , gallop   Abdomen- soft, non-tender   Extremities - warm, non-tender, minimal edema   Neuro- oriented, appropriate, no focal weakness   Lab Results:  Recent Labs  06/19/16 0355 06/19/16 1807 06/20/16 0356  WBC 10.0  --  7.4  HGB 8.8* 8.2* 8.3*  HCT 25.6* 24.0* 24.4*  PLT 96*  --  112*   BMET:  Recent Labs  06/19/16 0355 06/19/16 1807 06/20/16 0356  NA 135 134* 133*  K 3.8 3.7 3.2*  CL 102 99* 100*  CO2 25  --  26  GLUCOSE 132* 162* 108*  BUN 17 19 19   CREATININE 0.96 0.90 0.95  CALCIUM 7.8*  --  7.5*    PT/INR:  Recent Labs  06/17/16 1753  LABPROT 15.7*  INR 1.24   ABG    Component Value Date/Time   PHART 7.368 06/17/2016 2207   HCO3 22.6 06/17/2016 2207   TCO2 25 06/19/2016 1807   ACIDBASEDEF 3.0 (H) 06/17/2016 2207   O2SAT 58.3 06/20/2016 0418   CBG (last 3)   Recent Labs   06/19/16 1934 06/19/16 2339 06/20/16 0334  GLUCAP 177* 116* 90    Assessment/Plan: S/P Procedure(s) (LRB): CORONARY ARTERY BYPASS GRAFTING (CABG) x four using left internal mammary artery and right greater saphenous leg vein using endoscope. (N/A) TRANSESOPHAGEAL ECHOCARDIOGRAM (TEE) (N/A) tx stepdown Diuresis Cont low dose muil for ischemic cardiomyopathy   LOS: 3 days    Tharon Aquas Trigt III 06/20/2016

## 2016-06-20 NOTE — Progress Notes (Signed)
Report called to [facility] [address].

## 2016-06-21 ENCOUNTER — Inpatient Hospital Stay (HOSPITAL_COMMUNITY): Payer: PPO

## 2016-06-21 DIAGNOSIS — H538 Other visual disturbances: Secondary | ICD-10-CM

## 2016-06-21 DIAGNOSIS — J9690 Respiratory failure, unspecified, unspecified whether with hypoxia or hypercapnia: Secondary | ICD-10-CM | POA: Diagnosis not present

## 2016-06-21 DIAGNOSIS — J90 Pleural effusion, not elsewhere classified: Secondary | ICD-10-CM | POA: Diagnosis not present

## 2016-06-21 DIAGNOSIS — R51 Headache: Secondary | ICD-10-CM | POA: Diagnosis not present

## 2016-06-21 DIAGNOSIS — I700 Atherosclerosis of aorta: Secondary | ICD-10-CM | POA: Diagnosis not present

## 2016-06-21 LAB — BASIC METABOLIC PANEL
Anion gap: 11 (ref 5–15)
BUN: 24 mg/dL — ABNORMAL HIGH (ref 6–20)
CO2: 24 mmol/L (ref 22–32)
Calcium: 7.9 mg/dL — ABNORMAL LOW (ref 8.9–10.3)
Chloride: 98 mmol/L — ABNORMAL LOW (ref 101–111)
Creatinine, Ser: 1.24 mg/dL (ref 0.61–1.24)
GFR calc Af Amer: >60 mL/min (ref >60)
GFR calc non Af Amer: 59 mL/min — ABNORMAL LOW (ref >60)
Glucose, Bld: 183 mg/dL — ABNORMAL HIGH (ref 65–99)
Potassium: 4.2 mmol/L (ref 3.5–5.1)
Sodium: 133 mmol/L — ABNORMAL LOW (ref 135–145)

## 2016-06-21 LAB — CBC
HCT: 26.1 % — ABNORMAL LOW (ref 39.0–52.0)
Hemoglobin: 8.7 g/dL — ABNORMAL LOW (ref 13.0–17.0)
MCH: 28.7 pg (ref 26.0–34.0)
MCHC: 33.3 g/dL (ref 30.0–36.0)
MCV: 86.1 fL (ref 78.0–100.0)
Platelets: 137 10*3/uL — ABNORMAL LOW (ref 150–400)
RBC: 3.03 MIL/uL — ABNORMAL LOW (ref 4.22–5.81)
RDW: 14.7 % (ref 11.5–15.5)
WBC: 7.5 10*3/uL (ref 4.0–10.5)

## 2016-06-21 LAB — GLUCOSE, CAPILLARY
Glucose-Capillary: 156 mg/dL — ABNORMAL HIGH (ref 65–99)
Glucose-Capillary: 223 mg/dL — ABNORMAL HIGH (ref 65–99)
Glucose-Capillary: 209 mg/dL — ABNORMAL HIGH (ref 65–99)
Glucose-Capillary: 180 mg/dL — ABNORMAL HIGH (ref 65–99)

## 2016-06-21 LAB — COOXEMETRY PANEL
Carboxyhemoglobin: 1.8 % — ABNORMAL HIGH (ref 0.5–1.5)
Methemoglobin: 1 % (ref 0.0–1.5)
O2 Saturation: 55.6 %
Total hemoglobin: 9 g/dL — ABNORMAL LOW (ref 12.0–16.0)

## 2016-06-21 MED ORDER — ASPIRIN EC 81 MG PO TBEC
81.0000 mg | DELAYED_RELEASE_TABLET | Freq: Every day | ORAL | Status: DC
  Administered 2016-06-22 – 2016-06-26 (×5): 81 mg via ORAL
  Filled 2016-06-21 (×5): qty 1

## 2016-06-21 MED ORDER — CLOPIDOGREL BISULFATE 75 MG PO TABS
75.0000 mg | ORAL_TABLET | Freq: Every day | ORAL | Status: DC
  Administered 2016-06-22 – 2016-06-26 (×5): 75 mg via ORAL
  Filled 2016-06-21 (×5): qty 1

## 2016-06-21 MED ORDER — METOPROLOL TARTRATE 25 MG PO TABS
25.0000 mg | ORAL_TABLET | Freq: Two times a day (BID) | ORAL | Status: DC
  Administered 2016-06-21 – 2016-06-26 (×10): 25 mg via ORAL
  Filled 2016-06-21 (×10): qty 1

## 2016-06-21 MED ORDER — INSULIN DETEMIR 100 UNIT/ML ~~LOC~~ SOLN
18.0000 [IU] | Freq: Two times a day (BID) | SUBCUTANEOUS | Status: DC
  Administered 2016-06-21 – 2016-06-26 (×9): 18 [IU] via SUBCUTANEOUS
  Filled 2016-06-21 (×10): qty 0.18

## 2016-06-21 MED ORDER — SODIUM CHLORIDE 0.9% FLUSH
10.0000 mL | INTRAVENOUS | Status: DC | PRN
  Administered 2016-06-21: 10 mL
  Filled 2016-06-21: qty 40

## 2016-06-21 MED ORDER — STROKE: EARLY STAGES OF RECOVERY BOOK
Freq: Once | Status: AC
  Administered 2016-06-21: 16:00:00
  Filled 2016-06-21: qty 1

## 2016-06-21 NOTE — Progress Notes (Signed)
06/21/2016 11:27 AM EPW D/C'd per order and per protocol.  Ends intact. Pt. Tolerated well.  Advised bedrest x1hr.  Call bell in reach.  Vital signs collected per protocol. [place]

## 2016-06-21 NOTE — Progress Notes (Addendum)
      [address] Beach[address]       [place] [address]             [phone]    I Received a phone call about the abnormal CT scan results from the nurse. Report reviewed and shown below. Neurology on call paged and will see the patient.  IMPRESSION: No intracranial hemorrhage mass effect or midline shift. There is area of decreased attenuation in right occipital lobe measures about 3 cm please see axial image 14 and sagittal image 23. This is highly suspicious for evolving ischemia/ infarct. Further correlation with brain MRI is recommended as clinically warranted. No definite mass lesion is noted on this unenhanced scan.   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  Tharon Aquas trigt M.D.

## 2016-06-21 NOTE — Progress Notes (Addendum)
[address]       [place] [address]             [phone]      4 Days Post-Op Procedure(s) (LRB): CORONARY ARTERY BYPASS GRAFTING (CABG) x four using left internal mammary artery and right greater saphenous leg vein using endoscope. (N/A) TRANSESOPHAGEAL ECHOCARDIOGRAM (TEE) (N/A)   Subjective: Shares that overall he is doing well however, since surgery his vision has changed. He shares he can't see as clearly as he used to even with his corrective glasses.   Objective: Vital signs in last 24 hours: Temp:  [97.8 F (36.6 C)-98.6 F (37 C)] 97.8 F (36.6 C) (03/30 0525) Pulse Rate:  [93-108] 97 (03/30 0525) Cardiac Rhythm: Normal sinus rhythm (03/29 1900) Resp:  [18-25] 18 (03/30 0525) BP: (96-129)/(44-62) 109/46 (03/30 0525) SpO2:  [90 %-100 %] 98 % (03/30 0525) Weight:  [89.3 kg (196 lb 12.8 oz)] 89.3 kg (196 lb 12.8 oz) (03/30 0525)    Intake/Output from previous day: 03/29 0701 - 03/30 0700 In: 490 [P.O.:480; I.V.:10] Out: 1685 [Urine:1685] Intake/Output this shift: No intake/output data recorded.  General appearance: alert, cooperative and no distress Heart: regular rate and rhythm, S1, S2 normal, no murmur, click, rub or gallop Lungs: clear to auscultation bilaterally Abdomen: soft, non-tender; bowel sounds normal; no masses,  no organomegaly Extremities: 2+ non pitting edema in food and hands.  Wound: clean and dry  Lab Results:  Recent Labs  06/20/16 0356 06/21/16 0538  WBC 7.4 7.5  HGB 8.3* 8.7*  HCT 24.4* 26.1*  PLT 112* 137*   BMET:  Recent Labs  06/20/16 0356 06/21/16 0538  NA 133* 133*  K 3.2* 4.2  CL 100* 98*  CO2 26 24  GLUCOSE 108* 183*  BUN 19 24*  CREATININE 0.95 1.24  CALCIUM 7.5* 7.9*    PT/INR: No results for input(s): LABPROT, INR in the last 72 hours. ABG    Component Value Date/Time   PHART 7.368 06/17/2016 2207   HCO3 22.6 06/17/2016 2207   TCO2 25 06/19/2016 1807   ACIDBASEDEF 3.0 (H)  06/17/2016 2207   O2SAT 55.6 06/21/2016 0602   CBG (last 3)   Recent Labs  06/20/16 1619 06/20/16 2111 06/21/16 0616  GLUCAP 201* 172* 156*    Assessment/Plan: S/P Procedure(s) (LRB): CORONARY ARTERY BYPASS GRAFTING (CABG) x four using left internal mammary artery and right greater saphenous leg vein using endoscope. (N/A) TRANSESOPHAGEAL ECHOCARDIOGRAM (TEE) (N/A)  1. CV-NSR in the 90s with occasional PVC. BP has been low, but improving. Holding parameters placed on Lopressor. coox 55.6 On Milrinone. Can discontinue EPW.   2. Pulm- Tolerating room air with good oxygen saturation. CXR showed small pleural effusions bilaterally are noted with bibasilar atelectasis. Stable cardiac prominence. Aortic atherosclerosis. No pneumothorax. Continue Xopenex treatments. Encourage incentive spirometry hourly.   3. Renal-creatinine increased from 0.95 to 1.24. Remains fluid overloaded on Lasix 40mg  IV daily. Fluid balance negative.   4. Endo-Blood glucose level with moderate control. Continue current regimen.  5. H and H stable, platelets trending up  6. Visual changes since surgery. Discussed with Dr. Prescott Gum, given history of recent stoke will order a head CT without contrast and consult neurology.  Plan: Making good progress. Can discontinue epicardial pacing wires. Continue to monitor vision closely. Continue diuretics for fluid overload. Discontonie milrinone and monitor. Ambulate TID. Continue to work on dietary intake.    LOS: 4 days    Drew Harrison  Harriet Pho 06/21/2016   head CT performed due to visual symptoms New area attenuation R occipital region Will resume preop plavix patient was placed on after a brainstem CVA in January and reduce asa to 81 mg

## 2016-06-21 NOTE — Care Management Important Message (Signed)
Important Message  Patient Details  Name: Drew Harrison MRN: [id number] Date of Birth: Jul 23, 1950   Medicare Important Message Given:  Yes    Yareth Macdonnell Abena 06/21/2016, 10:59 AM

## 2016-06-21 NOTE — Progress Notes (Signed)
CARDIAC REHAB PHASE I   PRE:  Rate/Rhythm: 102 ST    BP: sitting 105/48    SaO2: 93 RA  MODE:  Ambulation: 350 ft   POST:  Rate/Rhythm: 117 ST    BP: sitting 145/49     SaO2: 96 RA  Pt tolerated fairly well with RW. Steady, fairly depressed affect. HR up to 117 ST. C/o SOB at end of walk. SaO2 hard to register due to cold fingers. Eventually 96 RA. Return to recliner. Encouraged IS and more walking [place], ACSM 06/21/2016 9:54 AM

## 2016-06-21 NOTE — Consult Note (Signed)
Requesting Physician: Dr. Lucianne Lei    Chief Complaint:  Stroke  History obtained from:  Patient     HPI:                                                                                                                                         Drew Harrison is an 66 y.o. male 4 Days [place]. Post surgery patient states that he had some visual changes. CT of brain was obtained showing an area of decreased attenuation in the right occipital lobe that measures about 3 cm. For this reason neurology was consultative for what is likely a subacute stroke status post cardiac surgery.  Discussing with the patient about his vision, he states that after surgery he noted that all of his vision is blurry/not in focus. He does not state that he has a certain area to which he has no vision, it does not make a difference whether his close vision or far vision, his glasses do not help. Patient denies any eye pain, photophobia. Patient denies any weakness numbness or abnormalities of his extremities. Patient denies any difficulty speaking or understanding.  Date last known well: Unable to determine Time last known well: Unable to determine tPA Given: No: recent surgery   Past Medical History:  Diagnosis Date  . Coronary artery disease   . Diabetes mellitus without complication ([place])   . GERD (gastroesophageal reflux disease)   . Gout   . Headache   . Hyperlipidemia   . Hypertension   . Stroke ([place]) 03/29/2016   left facial droop    Past Surgical History:  Procedure Laterality Date  . CORONARY ARTERY BYPASS GRAFT N/A 06/17/2016   Procedure: CORONARY ARTERY BYPASS GRAFTING (CABG) x four using left internal mammary artery and right greater saphenous leg vein using endoscope.;  Surgeon: [name], MD;  Location: [facility];  Service: Open Heart Surgery;  Laterality: N/A;  . LEFT HEART CATH AND CORONARY ANGIOGRAPHY N/A 05/14/2016   Procedure: Left Heart Cath and Coronary  Angiography;  Surgeon: Adrian Prows, MD;  Location: [facility];  Service: Cardiovascular;  Laterality: N/A;  . TEE WITHOUT CARDIOVERSION N/A 06/17/2016   Procedure: TRANSESOPHAGEAL ECHOCARDIOGRAM (TEE);  Surgeon: [name], MD;  Location: [facility];  Service: Open Heart Surgery;  Laterality: N/A;  . TONSILLECTOMY      History reviewed. No pertinent family history. Social History:  reports that he quit smoking about 6 weeks ago. His smoking use included Cigarettes. He has a 72.00 pack-year smoking history. He has never used smokeless tobacco. He reports that he drinks alcohol. He reports that he uses drugs, including Cocaine.  Allergies:  Allergies  Allergen Reactions  . No Known Allergies     Medications:  Scheduled: . acetaminophen  1,000 mg Oral Q6H   Or  . acetaminophen (TYLENOL) oral liquid 160 mg/5 mL  1,000 mg Per Tube Q6H  . aspirin EC  325 mg Oral Daily   Or  . aspirin  324 mg Per Tube Daily  . atorvastatin  40 mg Oral q1800  . bisacodyl  10 mg Oral Daily   Or  . bisacodyl  10 mg Rectal Daily  . docusate sodium  200 mg Oral Daily  . ferrous [id number] C-folic acid  1 capsule Oral BID  . furosemide  40 mg Intravenous Daily  . guaiFENesin  600 mg Oral BID  . insulin aspart  0-24 Units Subcutaneous TID AC & HS  . insulin aspart  3 Units Subcutaneous TID WC  . insulin detemir  12 Units Subcutaneous BID  . mouth rinse  15 mL Mouth Rinse BID  . metoprolol tartrate  12.5 mg Oral BID  . pantoprazole  40 mg Oral Daily  . potassium chloride  20 mEq Oral BID  . sodium chloride flush  3 mL Intravenous Q12H  . sodium chloride flush  3 mL Intravenous Q12H    ROS:                                                                                                                                       History obtained from the patient  General ROS:  negative for - chills, fatigue, fever, night sweats, weight gain or weight loss Psychological ROS: negative for - behavioral disorder, hallucinations, memory difficulties, mood swings or suicidal ideation Ophthalmic ROS: negative for - blurry vision, double vision, eye pain or loss of vision ENT ROS: negative for - epistaxis, nasal discharge, oral lesions, sore throat, tinnitus or vertigo Allergy and Immunology ROS: negative for - hives or itchy/watery eyes Hematological and Lymphatic ROS: negative for - bleeding problems, bruising or swollen lymph nodes Endocrine ROS: negative for - galactorrhea, hair pattern changes, polydipsia/polyuria or temperature intolerance Respiratory ROS: negative for - cough, hemoptysis, shortness of breath or wheezing Cardiovascular ROS: negative for - chest pain, dyspnea on exertion, edema or irregular heartbeat Gastrointestinal ROS: negative for - abdominal pain, diarrhea, hematemesis, nausea/vomiting or stool incontinence Genito-Urinary ROS: negative for - dysuria, hematuria, incontinence or urinary frequency/urgency Musculoskeletal ROS: negative for - joint swelling or muscular weakness Neurological ROS: as noted in HPI Dermatological ROS: negative for rash and skin lesion changes  Neurologic Examination:  Blood pressure (!) 126/57, pulse 94, temperature 97.8 F (36.6 C), temperature source Oral, resp. rate 16, height 6' (1.829 m), weight 89.3 kg (196 lb 12.8 oz), SpO2 93 %.  HEENT-  Normocephalic, no lesions, without obvious abnormality.  Normal external eye and conjunctiva.  Normal TM's bilaterally.  Normal auditory canals and external ears. Normal external nose, mucus membranes and septum.  Normal pharynx. Cardiovascular- S1, S2 normal, pulses palpable throughout   Lungs- chest clear, no wheezing, rales, normal symmetric air entry Abdomen- soft, non-tender;  bowel sounds normal; no masses,  no organomegaly Extremities- no edema Lymph-no adenopathy palpable Musculoskeletal-no joint tenderness, deformity or swelling Skin-warm and dry, no hyperpigmentation, vitiligo, or suspicious lesions  Neurological Examination Mental Status: Alert, oriented, thought content appropriate.  Speech fluent without evidence of aphasia.  Able to follow 3 step commands without difficulty. Cranial Nerves: II:  Visual fields grossly normal,  III,IV, VI: ptosis not present, extra-ocular motions intact bilaterally, pupils equal, round, reactive to light and accommodation V,VII: smile symmetric, facial light touch sensation normal bilaterally VIII: hearing normal bilaterally IX,X: uvula rises symmetrically XI: bilateral shoulder shrug XII: midline tongue extension Motor: Right : Upper extremity   5/5    Left:     Upper extremity   5/5  Lower extremity   5/5     Lower extremity   5/5 Tone and bulk:normal tone throughout; no atrophy noted Sensory: Pinprick and light touch intact throughout, bilaterally Deep Tendon Reflexes: 2+ and symmetric throughout Plantars: Right: downgoing   Left: downgoing Cerebellar: normal finger-to-nose,  and normal heel-to-shin test Gait: Not tested       Lab Results: Basic Metabolic Panel:  Recent Labs Lab 06/17/16 0613  06/17/16 2000  06/18/16 0316 06/18/16 1730 06/18/16 1748 06/19/16 0355 06/19/16 1807 06/20/16 0356 06/21/16 0538  NA 135  < >  --   < > 137  --  136 135 134* 133* 133*  K 4.2  < >  --   < > 3.8  --  4.1 3.8 3.7 3.2* 4.2  CL 97*  < >  --   < > 105  --  100* 102 99* 100* 98*  CO2 26  --   --   --  23  --   --  25  --  26 24  GLUCOSE 213*  < >  --   < > 123*  --  147* 132* 162* 108* 183*  BUN 19  < >  --   < > 12  --  16 17 19 19  24*  CREATININE 1.30*  < > 0.91  < > 0.93 0.99 1.00 0.96 0.90 0.95 1.24  CALCIUM 9.5  --   --   --  7.8*  --   --  7.8*  --  7.5* 7.9*  MG  --   --  2.5*  --  2.4 2.3  --   --    --   --   --   < > = values in this interval not displayed.  Liver Function Tests:  Recent Labs Lab 06/20/16 0356  AST 20  ALT 19  ALKPHOS 63  BILITOT 1.1  PROT 4.8*  ALBUMIN 2.4*   No results for input(s): LIPASE, AMYLASE in the last 168 hours. No results for input(s): AMMONIA in the last 168 hours.  CBC:  Recent Labs Lab 06/18/16 0316 06/18/16 1730 06/18/16 1748 06/19/16 0355 06/19/16 1807 06/20/16 0356 06/21/16 0538  WBC 9.2 10.9*  --  10.0  --  7.4 7.5  HGB 8.4* 9.0* 8.2* 8.8* 8.2* 8.3* 8.7*  HCT 24.5* 26.4* 24.0* 25.6* 24.0* 24.4* 26.1*  MCV 86.9 87.1  --  87.7  --  85.9 86.1  PLT 98* 96*  --  96*  --  112* 137*    Cardiac Enzymes: No results for input(s): CKTOTAL, CKMB, CKMBINDEX, TROPONINI in the last 168 hours.  Lipid Panel: No results for input(s): CHOL, TRIG, HDL, CHOLHDL, VLDL, LDLCALC in the last 168 hours.  CBG:  Recent Labs Lab 06/20/16 1221 06/20/16 1619 06/20/16 2111 06/21/16 0616 06/21/16 1153  GLUCAP 149* 201* 172* 156* 38*    Microbiology: Results for orders placed or performed during the hospital encounter of 06/06/16  Surgical pcr screen     Status: None   Collection Time: 06/06/16  8:46 AM  Result Value Ref Range Status   MRSA, PCR NEGATIVE NEGATIVE Final   Staphylococcus aureus NEGATIVE NEGATIVE Final    Comment:        The Xpert SA Assay (FDA approved for NASAL specimens in patients over 28 years of age), is one component of a comprehensive surveillance program.  Test performance has been validated by [facility] for patients greater than or equal to 20 year old. It is not intended to diagnose infection nor to guide or monitor treatment.     Coagulation Studies: No results for input(s): LABPROT, INR in the last 72 hours.  Imaging: Dg Chest 2 View  Result Date: 06/21/2016 CLINICAL DATA:  Congestive heart failure. EXAM: CHEST  2 VIEW COMPARISON:  June 20, 2016 FINDINGS: There has been clearing of interstitial  edema compared to 1 day prior. Only trace edema remains currently. There are small pleural effusions bilaterally with patchy bibasilar atelectatic change. There is cardiomegaly. The pulmonary vascularity is normal. Central catheter tip is in the superior vena cava. No pneumothorax. There is aortic atherosclerosis. Patient is status post coronary artery bypass grafting. No bone lesions. Temporary pacemaker wires remain attached to the right heart. IMPRESSION: Near complete clearing of interstitial edema compared to 1 day prior. Small pleural effusions bilaterally are noted with bibasilar atelectasis. Stable cardiac prominence. Aortic atherosclerosis. No pneumothorax. Electronically Signed   By: Lowella Grip III M.D.   On: 06/21/2016 08:10   Ct Head Wo Contrast  Result Date: 06/21/2016 CLINICAL DATA:  Blurry vision, intermittent headache for the last 2 days, status post CABG 06/17/2016 EXAM: CT HEAD WITHOUT CONTRAST TECHNIQUE: Contiguous axial images were obtained from the base of the skull through the vertex without intravenous contrast. COMPARISON:  None. FINDINGS: Brain: No intracranial hemorrhage mass effect or midline shift. There is area of decreased attenuation in right occipital lobe measures about 3 cm please see axial image 14 and sagittal image 23. This is highly suspicious for evolving ischemia/ infarct. Further correlation with brain MRI is recommended as clinically warranted. No definite mass lesion is noted on this unenhanced scan. Vascular: Atherosclerotic calcifications of carotid siphon are noted. Skull: No skull fracture. Sinuses/Orbits: No acute findings Other: None IMPRESSION: No intracranial hemorrhage mass effect or midline shift. There is area of decreased attenuation in right occipital lobe measures about 3 cm please see axial image 14 and sagittal image 23. This is highly suspicious for evolving ischemia/ infarct. Further correlation with brain MRI is recommended as clinically  warranted. No definite mass lesion is noted on this unenhanced scan. This was made a call report. Electronically Signed   By: Lahoma Crocker M.D.   On: 06/21/2016 10:36   Dg  Chest Port 1 View  Result Date: 06/20/2016 CLINICAL DATA:  Shortness of breath, status post CABG 3 days ago. EXAM: PORTABLE CHEST 1 VIEW COMPARISON:  Portable chest x-ray of June 19, 2016 FINDINGS: There is been interval removal of the left chest tube and of the mediastinal drain. There is no pneumothorax. A small amount of pleural fluid is likely present at both lung bases but is stable. The cardiac silhouette is enlarged. The pulmonary vascularity is engorged but slightly more distinct today. The right internal jugular Cordis sheath tip terminates over the proximal SVC. IMPRESSION: Mild improvement in pulmonary interstitial edema since yesterday's study. Tiny bilateral pleural effusions. Electronically Signed   By: David  Martinique M.D.   On: 06/20/2016 07:53       Assessment and plan discussed with with attending physician and they are in agreement.    Etta Quill PA-C [facility] [phone]  06/21/2016, 12:02 PM   Assessment: 66 y.o. male with new onset decreased visual acuity status post CABG. EKG shows a ischemic infarct which I suspect is in the visual association cortex. I suspect that this was embolic.   Stroke Risk Factors - diabetes mellitus, hyperlipidemia and hypertension  1) agree with antiplatelet therapy 2) PT,OT, ST 3) lipids, a1c 4) stroke team to follow  Roland Rack, MD [facility] [phone]  If 7pm- 7am, please page neurology on call as listed in [place].

## 2016-06-22 ENCOUNTER — Encounter (HOSPITAL_COMMUNITY): Payer: PPO

## 2016-06-22 ENCOUNTER — Inpatient Hospital Stay (HOSPITAL_COMMUNITY): Payer: PPO

## 2016-06-22 DIAGNOSIS — J9811 Atelectasis: Secondary | ICD-10-CM | POA: Diagnosis not present

## 2016-06-22 DIAGNOSIS — I97820 Postprocedural cerebrovascular infarction during cardiac surgery: Secondary | ICD-10-CM | POA: Diagnosis not present

## 2016-06-22 DIAGNOSIS — Z951 Presence of aortocoronary bypass graft: Secondary | ICD-10-CM

## 2016-06-22 LAB — LIPID PANEL
Cholesterol: 97 mg/dL (ref 0–200)
HDL: 24 mg/dL — ABNORMAL LOW (ref >40)
LDL Cholesterol: 45 mg/dL (ref 0–99)
Total CHOL/HDL Ratio: 4 RATIO
Triglycerides: 142 mg/dL (ref <150)
VLDL: 28 mg/dL (ref 0–40)

## 2016-06-22 LAB — BASIC METABOLIC PANEL
Anion gap: 8 (ref 5–15)
BUN: 25 mg/dL — ABNORMAL HIGH (ref 6–20)
CO2: 24 mmol/L (ref 22–32)
Calcium: 7.9 mg/dL — ABNORMAL LOW (ref 8.9–10.3)
Chloride: 104 mmol/L (ref 101–111)
Creatinine, Ser: 1.11 mg/dL (ref 0.61–1.24)
GFR calc Af Amer: >60 mL/min (ref >60)
GFR calc non Af Amer: >60 mL/min (ref >60)
Glucose, Bld: 103 mg/dL — ABNORMAL HIGH (ref 65–99)
Potassium: 3.8 mmol/L (ref 3.5–5.1)
Sodium: 136 mmol/L (ref 135–145)

## 2016-06-22 LAB — COOXEMETRY PANEL
Carboxyhemoglobin: 1.3 % (ref 0.5–1.5)
Methemoglobin: 0.9 % (ref 0.0–1.5)
O2 Saturation: 12.6 %
Total hemoglobin: 9.1 g/dL — ABNORMAL LOW (ref 12.0–16.0)

## 2016-06-22 LAB — CBC
HCT: 27.2 % — ABNORMAL LOW (ref 39.0–52.0)
Hemoglobin: 8.9 g/dL — ABNORMAL LOW (ref 13.0–17.0)
MCH: 28.3 pg (ref 26.0–34.0)
MCHC: 32.7 g/dL (ref 30.0–36.0)
MCV: 86.6 fL (ref 78.0–100.0)
Platelets: 155 10*3/uL (ref 150–400)
RBC: 3.14 MIL/uL — ABNORMAL LOW (ref 4.22–5.81)
RDW: 14.4 % (ref 11.5–15.5)
WBC: 6.5 10*3/uL (ref 4.0–10.5)

## 2016-06-22 LAB — GLUCOSE, CAPILLARY
Glucose-Capillary: 124 mg/dL — ABNORMAL HIGH (ref 65–99)
Glucose-Capillary: 206 mg/dL — ABNORMAL HIGH (ref 65–99)
Glucose-Capillary: 111 mg/dL — ABNORMAL HIGH (ref 65–99)
Glucose-Capillary: 151 mg/dL — ABNORMAL HIGH (ref 65–99)

## 2016-06-22 MED ORDER — METFORMIN HCL 500 MG PO TABS
500.0000 mg | ORAL_TABLET | Freq: Two times a day (BID) | ORAL | Status: DC
  Administered 2016-06-22 – 2016-06-26 (×9): 500 mg via ORAL
  Filled 2016-06-22 (×9): qty 1

## 2016-06-22 NOTE — Evaluation (Signed)
Physical Therapy Evaluation Patient Details Name: Drew Harrison MRN: [id number] DOB: 08-May-1950 Today's Date: 06/22/2016   History of Present Illness  Drew Harrison is an 66 y.o. male 4 Days Post-Op CORONARY ARTERY BYPASS GRAFTING. Post surgery patient states that he had some visual changes. CT of brain was obtained showing an area of decreased attenuation in the right occipital lobe that measures about 3 cm. PMH:  CAD, HTN, DM, CVA.    Clinical Impression  Pt admitted with above diagnosis. Pt currently with functional limitations due to the deficits listed below (see PT Problem List). Pt was able to ambulate without RW with min assist and with RW with min guard assist.  Pt likes RW presently as he also feels he gets less winded when using the RW.  Visual issues will be pts' greatest issue for IADL's.  HAve called OT to come and look at vision further.  Will follow acutely.   Pt will benefit from skilled PT to increase their independence and safety with mobility to allow discharge to the venue listed below.      Follow Up Recommendations Home health PT;Supervision/Assistance - 24 hour    Equipment Recommendations  Other (comment) (TBA) ?RW   Recommendations for Other Services       Precautions / Restrictions Precautions Precautions: Fall;Sternal Restrictions Weight Bearing Restrictions: No      Mobility  Bed Mobility Overal bed mobility: Independent                Transfers Overall transfer level: Independent                  Ambulation/Gait Ambulation/Gait assistance: Supervision;Min assist Ambulation Distance (Feet): 200 Feet Assistive device: Rolling walker (2 wheeled);None Gait Pattern/deviations: Step-through pattern;Decreased stride length   Gait velocity interpretation: Below normal speed for age/gender General Gait Details: Pt ambulates better with RW as pt is more steady and endurance is better with RW.  Can ambulate without RW but is a little  unsteady when fatigued without RW.   Stairs            Wheelchair Mobility    Modified Rankin (Stroke Patients Only)       Balance Overall balance assessment: Needs assistance Sitting-balance support: No upper extremity supported;Feet supported Sitting balance-Leahy Scale: Good     Standing balance support: No upper extremity supported;During functional activity;Bilateral upper extremity supported Standing balance-Leahy Scale: Fair Standing balance comment: can stand statically without RW              High level balance activites: Turns;Sudden stops;Direction changes;Head turns High Level Balance Comments: min guard assist with challenges with RW             Pertinent Vitals/Pain Pain Assessment: No/denies pain  VSS with sats >93%.    Home Living Family/patient expects to be discharged to:: Private residence Living Arrangements: Alone Available Help at Discharge: Family;Available PRN/intermittently Type of Home: House Home Access: Stairs to enter Entrance Stairs-Rails: Left Entrance Stairs-Number of Steps: 3 Home Layout: One level Home Equipment: None      Prior Function Level of Independence: Independent               Hand Dominance        Extremity/Trunk Assessment   Upper Extremity Assessment Upper Extremity Assessment: Defer to OT evaluation    Lower Extremity Assessment Lower Extremity Assessment: Generalized weakness    Cervical / Trunk Assessment Cervical / Trunk Assessment: Normal  Communication   Communication: No  difficulties  Cognition Arousal/Alertness: Awake/alert Behavior During Therapy: WFL for tasks assessed/performed Overall Cognitive Status: Within Functional Limits for tasks assessed                                        General Comments General comments (skin integrity, edema, etc.): Pt with visual problems.  Cannot see peripheral visual field.  Pt also cannot see distance in front of him.   this visual issue is new and is frustrating pt and he is really upset about his future as driving is out of the question and other function will be affected.  Called OT so that they can come and see pt to make any recommendations they may have.      Exercises     Assessment/Plan    PT Assessment Patient needs continued PT services  PT Problem List Decreased activity tolerance;Decreased balance;Decreased mobility;Decreased knowledge of use of DME;Decreased safety awareness;Decreased knowledge of precautions       PT Treatment Interventions DME instruction;Gait training;Functional mobility training;Therapeutic activities;Stair training;Therapeutic exercise;Balance training;Patient/family education    PT Goals (Current goals can be found in the Care Plan section)  Acute Rehab PT Goals Patient Stated Goal: to go home PT Goal Formulation: With patient Time For Goal Achievement: 06/29/16 Potential to Achieve Goals: Good    Frequency Min 3X/week   Barriers to discharge Decreased caregiver support      Co-evaluation               End of Session Equipment Utilized During Treatment: Gait belt Activity Tolerance: Patient limited by fatigue Patient left: in bed;with call bell/phone within reach Nurse Communication: Mobility status PT Visit Diagnosis: Other abnormalities of gait and mobility (R26.89)    Time: 3903-0092 PT Time Calculation (min) (ACUTE ONLY): 15 min   Charges:   PT Evaluation $PT Eval Moderate Complexity: 1 Procedure     PT G Codes:        Marsheila Alejo,PT Acute Rehabilitation [phone] (pager)   Denice Paradise 06/22/2016, 3:04 PM

## 2016-06-22 NOTE — Progress Notes (Signed)
06/21/2016 1730 Pt walked 170ft. with rolling walker on RA.  Pt tolerated well. [place]

## 2016-06-22 NOTE — Progress Notes (Addendum)
[address]       [place] [address]             [phone]      5 Days Post-Op Procedure(s) (LRB): CORONARY ARTERY BYPASS GRAFTING (CABG) x four using left internal mammary artery and right greater saphenous leg vein using endoscope. (N/A) TRANSESOPHAGEAL ECHOCARDIOGRAM (TEE) (N/A) Subjective: Feels ok but frustrated   Objective: Vital signs in last 24 hours: Temp:  [97.7 F (36.5 C)-98.5 F (36.9 C)] 97.7 F (36.5 C) (03/31 0320) Pulse Rate:  [84-105] 89 (03/31 0320) Cardiac Rhythm: Normal sinus rhythm (03/31 0733) Resp:  [16-18] 18 (03/31 0320) BP: (102-145)/(49-60) 117/54 (03/31 0320) SpO2:  [93 %-98 %] 93 % (03/31 0320) FiO2 (%):  [24 %] 24 % (03/30 1559) Weight:  [195 lb (88.5 kg)] 195 lb (88.5 kg) (03/31 0635)  Hemodynamic parameters for last 24 hours:    Intake/Output from previous day: 03/30 0701 - 03/31 0700 In: 1083 [P.O.:1080; I.V.:3] Out: 1000 [Urine:1000] Intake/Output this shift: No intake/output data recorded.  General appearance: alert, cooperative and no distress Heart: regular rate and rhythm Lungs: clear to auscultation bilaterally Abdomen: benign Extremities: no edema Wound: incis healing well  Lab Results:  Recent Labs  06/21/16 0538 06/22/16 0234  WBC 7.5 6.5  HGB 8.7* 8.9*  HCT 26.1* 27.2*  PLT 137* 155   BMET:  Recent Labs  06/21/16 0538 06/22/16 0234  NA 133* 136  K 4.2 3.8  CL 98* 104  CO2 24 24  GLUCOSE 183* 103*  BUN 24* 25*  CREATININE 1.24 1.11  CALCIUM 7.9* 7.9*    PT/INR: No results for input(s): LABPROT, INR in the last 72 hours. ABG    Component Value Date/Time   PHART 7.368 06/17/2016 2207   HCO3 22.6 06/17/2016 2207   TCO2 25 06/19/2016 1807   ACIDBASEDEF 3.0 (H) 06/17/2016 2207   O2SAT 55.6 06/21/2016 0602   CBG (last 3)   Recent Labs  06/21/16 1645 06/21/16 2112 06/22/16 0638  GLUCAP 209* 180* 124*    Meds Scheduled Meds: . acetaminophen  1,000 mg Oral Q6H   Or    . acetaminophen (TYLENOL) oral liquid 160 mg/5 mL  1,000 mg Per Tube Q6H  . aspirin EC  81 mg Oral Daily  . atorvastatin  40 mg Oral q1800  . bisacodyl  10 mg Oral Daily   Or  . bisacodyl  10 mg Rectal Daily  . clopidogrel  75 mg Oral Daily  . docusate sodium  200 mg Oral Daily  . ferrous [id number] C-folic acid  1 capsule Oral BID  . furosemide  40 mg Intravenous Daily  . guaiFENesin  600 mg Oral BID  . insulin aspart  0-24 Units Subcutaneous TID AC & HS  . insulin aspart  3 Units Subcutaneous TID WC  . insulin detemir  18 Units Subcutaneous BID  . mouth rinse  15 mL Mouth Rinse BID  . metoprolol tartrate  25 mg Oral BID  . pantoprazole  40 mg Oral Daily  . potassium chloride  20 mEq Oral BID  . sodium chloride flush  3 mL Intravenous Q12H  . sodium chloride flush  3 mL Intravenous Q12H   Continuous Infusions: . sodium chloride Stopped (06/18/16 0800)  . sodium chloride    . sodium chloride 10 mL/hr at 06/18/16 0500  . lactated ringers 10 mL/hr at 06/18/16 2200  . lactated ringers Stopped (06/17/16 1800)   PRN Meds:.sodium chloride, sodium  chloride, levalbuterol, metoprolol, midazolam, ondansetron (ZOFRAN) IV, oxyCODONE, sodium chloride flush, sodium chloride flush, sodium chloride flush, traMADol  Xrays Dg Chest 2 View  Result Date: 06/21/2016 CLINICAL DATA:  Congestive heart failure. EXAM: CHEST  2 VIEW COMPARISON:  June 20, 2016 FINDINGS: There has been clearing of interstitial edema compared to 1 day prior. Only trace edema remains currently. There are small pleural effusions bilaterally with patchy bibasilar atelectatic change. There is cardiomegaly. The pulmonary vascularity is normal. Central catheter tip is in the superior vena cava. No pneumothorax. There is aortic atherosclerosis. Patient is status post coronary artery bypass grafting. No bone lesions. Temporary pacemaker wires remain attached to the right heart. IMPRESSION: Near complete clearing of  interstitial edema compared to 1 day prior. Small pleural effusions bilaterally are noted with bibasilar atelectasis. Stable cardiac prominence. Aortic atherosclerosis. No pneumothorax. Electronically Signed   By: Lowella Grip III M.D.   On: 06/21/2016 08:10   Ct Head Wo Contrast  Result Date: 06/21/2016 CLINICAL DATA:  Blurry vision, intermittent headache for the last 2 days, status post CABG 06/17/2016 EXAM: CT HEAD WITHOUT CONTRAST TECHNIQUE: Contiguous axial images were obtained from the base of the skull through the vertex without intravenous contrast. COMPARISON:  None. FINDINGS: Brain: No intracranial hemorrhage mass effect or midline shift. There is area of decreased attenuation in right occipital lobe measures about 3 cm please see axial image 14 and sagittal image 23. This is highly suspicious for evolving ischemia/ infarct. Further correlation with brain MRI is recommended as clinically warranted. No definite mass lesion is noted on this unenhanced scan. Vascular: Atherosclerotic calcifications of carotid siphon are noted. Skull: No skull fracture. Sinuses/Orbits: No acute findings Other: None IMPRESSION: No intracranial hemorrhage mass effect or midline shift. There is area of decreased attenuation in right occipital lobe measures about 3 cm please see axial image 14 and sagittal image 23. This is highly suspicious for evolving ischemia/ infarct. Further correlation with brain MRI is recommended as clinically warranted. No definite mass lesion is noted on this unenhanced scan. This was made a call report. Electronically Signed   By: Lahoma Crocker M.D.   On: 06/21/2016 10:36    Assessment/Plan: S/P Procedure(s) (LRB): CORONARY ARTERY BYPASS GRAFTING (CABG) x four using left internal mammary artery and right greater saphenous leg vein using endoscope. (N/A) TRANSESOPHAGEAL ECHOCARDIOGRAM (TEE) (N/A) 1 stable , visual sx unchanged- neuro following, PT/OT consults have been made 2 cont pulm  toilet 3 conts diuresis for volume overload 4 sugars are elevated- A1C with terrible control- 13.5l- will add glucophage back. Discussed dietary changes to decrease CHO intake- he understands but seems poorly motivated and frustrated- he is willing to give himself insulin at home 5 CXR ATX slowly improving   LOS: 5 days    GOLD,WAYNE E 06/22/2016   Chart reviewed, patient examined, agree with above. He is slowly progressing. Ambulated today 350 ft. CXR shows mild bibasilar atelectasis. Continue IS, ambulation

## 2016-06-22 NOTE — Progress Notes (Signed)
VASCULAR LAB   Patient had carotid duplex 06/06/16 and 03/29/16 exhibiting 50% right ICA stenosis.  Please advise if you would like study repeated.  Thank you,   Shaheim Mahar, RVT 06/22/2016, 7:12 AM

## 2016-06-22 NOTE — Progress Notes (Signed)
CARDIAC REHAB PHASE I   PRE:  Rate/Rhythm: 98  BP:  Sitting: 118/59     SaO2: 94% ra  MODE:  Ambulation: 350 ft   POST:  Rate/Rhythm: 112  BP:  Sitting: 131/61     SaO2: 96%ra  8:45am- Patient ambulated using a walker and x1 assist. Patient walked at a steady pace. Had to take one rest break due to shortness of breath which he stated was moderate. Encouraged pursed lip breathing. Complained of calf tightness. Encouraged IS use. Patient is interested in going to Cardiac Rehab at [facility]. Order placed. Patient placed back in chair with call bell in reach.  [place], [place] 06/22/2016 9:16 AM

## 2016-06-22 NOTE — Evaluation (Signed)
Occupational Therapy Evaluation Patient Details Name: Drew Harrison MRN: [id number] DOB: [birth year] Today's Date: 06/22/2016    History of Present Illness Drew Harrison is an 66 y.o. male 4 Days Post-Op CORONARY ARTERY BYPASS GRAFTING. Post surgery patient states that he had some visual changes. CT of brain was obtained showing an area of decreased attenuation in the right occipital lobe that measures about 3 cm. PMH:  CAD, HTN, DM, CVA.     Clinical Impression   Pt admitted with above. He demonstrates the below listed deficits and will benefit from continued OT to maximize safety and independence with BADLs.  Pt presents to OT with generalized weakness, decreased activity tolerance, and visual changes.   Eval focused on his vision.  Pt is very angry and frustrated with situation.  He demonstrates significant apathy when assessing him making it very difficult to accurately assess.  When asked to perform any visual tasks he immediately states "I can't see anything, it doesn't matter".   With a lot of coaxing, encouragement and discussion, pt was more engaged toward the end of the eval.  He is able to read 66M sized print without glasses.  I was unable to appreciate a visual field deficit - he will require further assessment, and encouraged him to fully participate with other practitioners as his apathy makes it difficult to ascertain deficits.  He will need follow up OT at discharge as well as follow up with neuro ophthalmology for formal testing - anticipate his vision will respond, at least partially to corrective lenses.   Pt also appears very depressive and at times verbalizes hopelessness.        Follow Up Recommendations  Home health OT;Supervision/Assistance - 24 hour (follow up with neuro ophthalmology )    Equipment Recommendations  Tub/shower seat    Recommendations for Other Services       Precautions / Restrictions Precautions Precautions: Fall;Sternal Restrictions Weight  Bearing Restrictions: No      Mobility Bed Mobility Overal bed mobility: Independent                Transfers Overall transfer level: Independent                    Balance Overall balance assessment: Needs assistance Sitting-balance support: No upper extremity supported;Feet supported Sitting balance-Leahy Scale: Good     Standing balance support: No upper extremity supported;During functional activity;Bilateral upper extremity supported Standing balance-Leahy Scale: Fair Standing balance comment: can stand statically without RW              High level balance activites: Turns;Sudden stops;Direction changes;Head turns High Level Balance Comments: min guard assist with challenges with RW           ADL either performed or assessed with clinical judgement   ADL                                         General ADL Comments: Pt would not engage in ADL activities with OT at this time due to frustration over vision changes/circumstances.  Allowed      Vision Baseline Vision/History: Wears glasses Wears Glasses: At all times Patient Visual Report: Blurring of vision Vision Assessment?: Yes Eye Alignment: Within Functional Limits Ocular Range of Motion: Within Functional Limits Alignment/Gaze Preference: Within Defined Limits Tracking/Visual Pursuits: Able to track stimulus in all quads without difficulty Visual Fields:  Other (comment) (unable to appreciate a field deficit with confrontation test) Additional Comments: Pt reports he "can't see anything"  He insists he can't read anything far away or up close, and that he is "doomed".   However, when asked, he was able to read large print on pamphlet (to which he responds, "no one prints things that large", and he was able to read signs on wall in his room, and the larger "ticker tape" on [employer] - he keeps inisting he can read it because he knows what's going on in the news and therefore is  anticipating what it says  (although he could read it when it changed subject matter).   Pt was able to read 22M sized print with 98% accuracy (1/2 page).  Visual fields were tested x 3 via confrontation testing and unable to appreciate a visual field deficit, however, pt's initial response was to throw his hands up and say he can't see anything.   He requires signficant encouragement and prompting to put forth good effort with testing efforts.   Long discussion with pt about need to fully participate for his own benefit.  Also provided active listening.  Pt is very fearful of another event and worsening as well as over job and financial concerns - active listening and encouragement provided      Perception Perception Perception Tested?: Yes   Praxis Praxis Praxis tested?: Within functional limits    Pertinent Vitals/Pain Pain Assessment: No/denies pain     Hand Dominance     Extremity/Trunk Assessment Upper Extremity Assessment Upper Extremity Assessment: Generalized weakness   Lower Extremity Assessment Lower Extremity Assessment: Defer to PT evaluation   Cervical / Trunk Assessment Cervical / Trunk Assessment: Normal   Communication Communication Communication: No difficulties   Cognition Arousal/Alertness: Awake/alert Behavior During Therapy:  (angry ) Overall Cognitive Status: Within Functional Limits for tasks assessed                                     General Comments  Pt with visual problems.  Cannot see peripheral visual field.  Pt also cannot see distance in front of him.  this visual issue is new and is frustrating pt and he is really upset about his future as driving is out of the question and other function will be affected.  Called OT so that they can come and see pt to make any recommendations they may have.      Exercises     Shoulder Instructions      Home Living Family/patient expects to be discharged to:: Private residence Living  Arrangements: Alone Available Help at Discharge: Family;Available PRN/intermittently Type of Home: House Home Access: Stairs to enter [employer] of Steps: 3 Entrance Stairs-Rails: Left Home Layout: One level         Bathroom Toilet: Standard Bathroom Accessibility: Yes   Home Equipment: None          Prior Functioning/Environment Level of Independence: Independent                 OT Problem List: Decreased strength;Decreased activity tolerance;Impaired balance (sitting and/or standing);Impaired vision/perception;Decreased knowledge of use of DME or AE;Decreased knowledge of precautions;Cardiopulmonary status limiting activity      OT Treatment/Interventions: Self-care/ADL training;DME and/or AE instruction;Therapeutic activities;Visual/perceptual remediation/compensation;Patient/family education    OT Goals(Current goals can be found in the care plan section) Acute Rehab OT Goals Patient Stated Goal: to  be able to see  OT Goal Formulation: With patient Time For Goal Achievement: 06/29/16 Potential to Achieve Goals: Good ADL Goals Pt Will Perform Grooming: with modified independence;standing Pt Will Perform Upper Body Bathing: with modified independence;sitting Pt Will Perform Lower Body Bathing: with modified independence;sit to/from stand Pt Will Perform Upper Body Dressing: with modified independence;sitting Pt Will Perform Lower Body Dressing: with modified independence;sit to/from stand Pt Will Transfer to Toilet: with modified independence;ambulating;regular height toilet;bedside commode;grab bars Pt Will Perform Toileting - Clothing Manipulation and hygiene: with modified independence;sit to/from stand Pt Will Perform Tub/Shower Transfer: Tub transfer;Shower transfer;ambulating;shower seat;rolling walker Additional ADL Goal #1: Pt will implement an organized scan pattern to locate items in busy unstructured environment with min cues Additional ADL  Goal #2: Pt will engage in further visual assessment   OT Frequency: Min 3X/week   Barriers to D/C: Decreased caregiver support          Co-evaluation              End of Session    Activity Tolerance: Patient tolerated treatment well Patient left: in chair;with call bell/phone within reach  OT Visit Diagnosis: Low vision, both eyes (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

## 2016-06-22 NOTE — Progress Notes (Signed)
STROKE TEAM PROGRESS NOTE   HISTORY OF PRESENT ILLNESS (per record) Drew Harrison is an 66 y.o. male 4 Days Post-OpCORONARY ARTERY BYPASS GRAFTING. Post surgery patient states that he had some visual changes. CT of brain was obtained showing an area of decreased attenuation in the right occipital lobe that measures about 3 cm. For this reason neurology was consultative for what is likely a subacute stroke status post cardiac surgery.  Discussing with the patient about his vision, he states that after surgery he noted that all of his vision is blurry/not in focus. He does not state that he has a certain area to which he has no vision, it does not make a difference whether his close vision or far vision, his glasses do not help. Patient denies any eye pain, photophobia. Patient denies any weakness numbness or abnormalities of his extremities. Patient denies any difficulty speaking or understanding.  Date last known well: Unable to determine Time last known well: Unable to determine tPA Given: No: recent surgery   SUBJECTIVE (INTERVAL HISTORY)  No one   is at the bedside.  He is standing at sink. Left sided vision loss persists  OBJECTIVE Temp:  [97.7 F (36.5 C)-98.5 F (36.9 C)] 97.7 F (36.5 C) (03/31 0320) Pulse Rate:  [84-105] 89 (03/31 0320) Cardiac Rhythm: Normal sinus rhythm (03/31 0733) Resp:  [16-18] 18 (03/31 0320) BP: (102-145)/(49-60) 117/54 (03/31 0320) SpO2:  [93 %-98 %] 93 % (03/31 0320) FiO2 (%):  [24 %] 24 % (03/30 1559) Weight:  [88.5 kg (195 lb)] 88.5 kg (195 lb) (03/31 0635)  CBC:   Recent Labs Lab 06/21/16 0538 06/22/16 0234  WBC 7.5 6.5  HGB 8.7* 8.9*  HCT 26.1* 27.2*  MCV 86.1 86.6  PLT 137* 976    Basic Metabolic Panel:  Recent Labs Lab 06/18/16 0316 06/18/16 1730  06/21/16 0538 06/22/16 0234  NA 137  --   < > 133* 136  K 3.8  --   < > 4.2 3.8  CL 105  --   < > 98* 104  CO2 23  --   < > 24 24  GLUCOSE 123*  --   < > 183* 103*  BUN 12   --   < > 24* 25*  CREATININE 0.93 0.99  < > 1.24 1.11  CALCIUM 7.8*  --   < > 7.9* 7.9*  MG 2.4 2.3  --   --   --   < > = values in this interval not displayed.  Lipid Panel:     Component Value Date/Time   CHOL 97 06/22/2016 0234   TRIG 142 06/22/2016 0234   HDL 24 (L) 06/22/2016 0234   CHOLHDL 4.0 06/22/2016 0234   VLDL 28 06/22/2016 0234   LDLCALC 45 06/22/2016 0234   HgbA1c:  Lab Results  Component Value Date   HGBA1C 13.6 (H) 06/06/2016   Urine Drug Screen:     Component Value Date/Time   LABOPIA NONE DETECTED 03/29/2016 0903   COCAINSCRNUR NONE DETECTED 03/29/2016 0903   LABBENZ NONE DETECTED 03/29/2016 0903   AMPHETMU NONE DETECTED 03/29/2016 0903   THCU NONE DETECTED 03/29/2016 0903   LABBARB NONE DETECTED 03/29/2016 0903      IMAGING  Dg Chest 2 View 06/22/2016 Stable left basilar atelectasis and minimally improved right basilar atelectasis.     Dg Chest 2 View 06/21/2016 Near complete clearing of interstitial edema compared to 1 day prior. Small pleural effusions bilaterally are noted with bibasilar atelectasis.  Stable cardiac prominence. Aortic atherosclerosis. No pneumothorax.     Ct Head Wo Contrast 06/21/2016 No intracranial hemorrhage mass effect or midline shift. There is area of decreased attenuation in right occipital lobe measures about 3 cm please see axial image 14 and sagittal image 23. This is highly suspicious for evolving ischemia/ infarct. Further correlation with brain MRI is recommended as clinically warranted. No definite mass lesion is noted on this unenhanced scan. This was made a call report.    TEE echocardiogram (OR) 06/17/2016  Left ventricle: Normal cavity size and wall thickness. LV systolic function is low normal with an EF of 50-55%. Wall motion is abnormal. Inferior wall motion is hypokinetic. Inferolateral wall motion is hypokinetic. No thrombus present. No mass present.  Aortic valve: No AV vegetation.  Mitral valve: No  leaflet thickening and calcification present. Mild regurgitation. There is mild prolapse of the posterior mitral leaflet. Holosystolic mitral valve prolapse.  Right ventricle: Normal cavity size, wall thickness and ejection fraction.  Left ventricle: Normal cavity size.   Carotid Dopplers 03/29/2016 Right: Heterogeneous and partially calcified plaque at the carotid bifurcation, with discordant results regarding degree of stenosis by established duplex criteria. Peak velocity suggests 50- 69% stenosis, with the ICA/ CCA ratio suggesting a lesser degree of stenosis. If establishing a more accurate degree of stenosis is required, cerebral angiogram should be considered, or as a second best test, CTA. Left: Color duplex indicates moderate heterogeneous and calcified plaque, with no hemodynamically significant stenosis by duplex criteria in the left extracranial cerebrovascular circulation.  LEFT AND RIGHT VERTEBRAL ARTERIES:  Antegrade flow with low resistance waveform.   MR MRA Head Brain Wo Contrast 03/29/2016 1. Mild atheromatous irregularity within the basilar artery without flow-limiting stenosis. 2. Otherwise normal intracranial MRA.   MRI Brain Wo Contrast 03/29/2016 1. Acute lacunar type infarct in the left lower brainstem at the pontomedullary junction (series 3, image 71), probably affecting the left facial nerve motor nucleus in this clinical setting. No associated hemorrhage or mass effect. 2. Generalized intracranial artery dolichoectasia. 3. Otherwise largely unremarkable for age noncontrast MRI appearance of the brain.    PHYSICAL EXAM Pleasant middle aged caucasian male not in distress.  . Afebrile. Head is nontraumatic. Neck is supple without bruit.    Cardiac exam no murmur or gallop. Lungs are clear to auscultation. Distal pulses are well felt.  Neurological Exam ;  Awake  Alert oriented x 3. Normal speech and language.Diminished recall 2/3. .eye movements  full without nystagmus.fundi were not visualized. Vision acuity   appears normal.  Dense left homonymous hemianopiaHearing is normal. Palatal movements are normal. Face symmetric. Tongue midline. Normal strength, tone, reflexes and coordination. Normal sensation. Gait deferred.      ASSESSMENT/PLAN Drew Harrison is a 66 y.o. male with history of coronary artery disease, status post CABG 06/17/2016, previous stroke in January of this year (Dr Merlene Laughter), hypertension, hyperlipidemia, diabetes mellitus, and gout  presenting with visual changes. He did not receive IV t-PA due to recent surgery.  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  Resultant  Dense left homonymous hemianopsia  MRI - not performed  MRA - not performed  Carotid Doppler - 03/29/2016 - 50 - 69% right internal carotid artery stenosis.  TEE Echo - (OR) 06/17/2016 - EF 50-55%. No thrombus present.  LDL - 45  HgbA1c - pending  VTE prophylaxis - SCDs Diet heart healthy/carb modified Room service appropriate? Yes; Fluid consistency: Thin  aspirin 81 mg daily and  clopidogrel 75 mg daily prior to admission, now on aspirin 81 mg daily and clopidogrel 75 mg daily  Patient counseled to be compliant with his antithrombotic medications  Ongoing aggressive stroke risk factor management  Therapy recommendations: pending  Disposition: pending  Hypertension  Blood pressure tends to run low (currently not on antihypertensive medications)  Permissive hypertension (OK if < 220/120) but gradually normalize in 5-7 days  Long-term BP goal normotensive  Hyperlipidemia  Home meds:  Lipitor 40 mg daily resumed in hospital  LDL 45, goal < 70  Continue statin at discharge  Diabetes  HgbA1c pending, goal < 7.0  Unc / Controlled  Other Stroke Risk Factors  Advanced age  The patient quit smoking 6 weeks ago  ETOH use, advised to drink no more than 1 to 2 drinks per day.  Overweight, Body mass  index is 26.45 kg/m., recommend weight loss, diet and exercise as appropriate   Hx stroke/TIA  Coronary artery disease   Other Active Problems  Postop anemia - 8.9 / 27.2  Mild hypotension  Moderate right internal carotid artery disease  Probable mild dehydration - BUN 25  Hospital day # 5  I have personally examined this patient, reviewed notes, independently viewed imaging studies, participated in medical decision making and plan of care.ROS completed by me personally and pertinent positives fully documented  I have made any additions or clarifications directly to the above note. Agree with note above. He has developed left-sided homonymous hemianopsia secondary to right posterior cerebral artery infarct in the perioperative period. Recommend continue dual antiplatelet therapy given his cardiac disease. Patient was advised not to drive till his peripheral vision loss improves. Maintain aggressive risk factor modification. Greater than 50% time during this 35 minute visit was spent on counseling and coordination of care about his vision loss, stroke, stroke prevention and treatment discussion  Antony Contras, MD Medical Director [facility] Pager: [phone] 06/22/2016 2:46 PM   To contact Stroke Continuity provider, please refer to [email or web address] After hours, contact General Neurology

## 2016-06-23 DIAGNOSIS — I97820 Postprocedural cerebrovascular infarction during cardiac surgery: Secondary | ICD-10-CM | POA: Diagnosis not present

## 2016-06-23 DIAGNOSIS — Z951 Presence of aortocoronary bypass graft: Secondary | ICD-10-CM | POA: Diagnosis not present

## 2016-06-23 LAB — HEMOGLOBIN A1C
Hgb A1c MFr Bld: 12.2 % — ABNORMAL HIGH (ref 4.8–5.6)
Mean Plasma Glucose: 303 mg/dL

## 2016-06-23 LAB — GLUCOSE, CAPILLARY
Glucose-Capillary: 106 mg/dL — ABNORMAL HIGH (ref 65–99)
Glucose-Capillary: 184 mg/dL — ABNORMAL HIGH (ref 65–99)
Glucose-Capillary: 175 mg/dL — ABNORMAL HIGH (ref 65–99)
Glucose-Capillary: 86 mg/dL (ref 65–99)

## 2016-06-23 MED ORDER — ALUM & MAG HYDROXIDE-SIMETH 200-200-20 MG/5ML PO SUSP
15.0000 mL | ORAL | Status: DC | PRN
  Administered 2016-06-23: 15 mL via ORAL
  Filled 2016-06-23: qty 30

## 2016-06-23 NOTE — Progress Notes (Signed)
06/23/2016 1830 Pt walked around unit with rolling walker on RA by himself.  Pt tolerated well. [place]

## 2016-06-23 NOTE — Progress Notes (Signed)
Occupational Therapy Treatment Patient Details Name: Drew Harrison MRN: [id number] DOB: [birth year] Today's Date: 06/23/2016    History of present illness Drew Harrison is an 67 y.o. male 4 Days Post-Op CORONARY ARTERY BYPASS GRAFTING. Post surgery patient states that he had some visual changes. CT of brain was obtained showing an area of decreased attenuation in the right occipital lobe that measures about 3 cm. PMH:  CAD, HTN, DM, CVA.     OT comments  Pt with a bit brighter affect, however, continues to be frustrated and irritable.  He is able to demonstrate organized scan path during number cancellation tasks, and is able to locate items in hallway on Lt without cues.    Follow Up Recommendations  Home health OT;Supervision/Assistance - 24 hour    Equipment Recommendations  Tub/shower seat    Recommendations for Other Services      Precautions / Restrictions Precautions Precautions: Fall;Sternal       Mobility Bed Mobility Overal bed mobility: Independent                Transfers Overall transfer level: Independent Equipment used: None                  Balance Overall balance assessment: Needs assistance Sitting-balance support: No upper extremity supported;Feet supported Sitting balance-Leahy Scale: Good     Standing balance support: During functional activity Standing balance-Leahy Scale: Fair                             ADL either performed or assessed with clinical judgement   ADL                                         General ADL Comments: Pt is able to perform ADLs mod I      Vision   Additional Comments: Pt able to perform number cancellation tasks accurately with increased time using 31M print.  He requires increased time, and complains of eye strain.  He was able to identify all obstacles and info on Lt and Rt while ambulating in hallway.  Continued discussion with him re: need to follow up ophthalmology  evaluation.     Perception     Praxis      Cognition Arousal/Alertness: Awake/alert Behavior During Therapy:  (irritable ) Overall Cognitive Status: Within Functional Limits for tasks assessed                                          Exercises     Shoulder Instructions       General Comments Pt continues to voice irritation and frustration over situation.  He reports his daughter's mother will likely be able to assist with transportation, and is planning options for work when that time comes - discussed with him that he likley will be able to return to work once medically cleared by MD, but may need transportation to and from depending on results of visual field testing.      Pertinent Vitals/ Pain       Pain Assessment: No/denies pain  Home Living  Prior Functioning/Environment              Frequency  Min 3X/week        Progress Toward Goals  OT Goals(current goals can now be found in the care plan section)        Plan Discharge plan remains appropriate    Co-evaluation                 End of Session    OT Visit Diagnosis: Low vision, both eyes (H54.2)   Activity Tolerance Patient tolerated treatment well   Patient Left in chair;with call bell/phone within reach   Nurse Communication Mobility status        Time: 1497-0263 OT Time Calculation (min): 78 min  Charges: OT General Charges $OT Visit: 1 Procedure OT Treatments $Therapeutic Activity: 68-82 mins  [facility], OTR/L [phone]    Lucille Passy M 06/23/2016, 1:27 PM

## 2016-06-23 NOTE — Progress Notes (Signed)
STROKE TEAM PROGRESS NOTE   HISTORY OF PRESENT ILLNESS (per record) Drew Harrison is an 66 y.o. male 4 Days Post-OpCORONARY ARTERY BYPASS GRAFTING. Post surgery patient states that he had some visual changes. CT of brain was obtained showing an area of decreased attenuation in the right occipital lobe that measures about 3 cm. For this reason neurology was consultative for what is likely a subacute stroke status post cardiac surgery.  Discussing with the patient about his vision, he states that after surgery he noted that all of his vision is blurry/not in focus. He does not state that he has a certain area to which he has no vision, it does not make a difference whether his close vision or far vision, his glasses do not help. Patient denies any eye pain, photophobia. Patient denies any weakness numbness or abnormalities of his extremities. Patient denies any difficulty speaking or understanding.  Date last known well: Unable to determine Time last known well: Unable to determine tPA Given: No: recent surgery   SUBJECTIVE (INTERVAL HISTORY)  No one   is at the bedside.  He is standing at sink. Left sided vision loss improving  OBJECTIVE Temp:  [97.7 F (36.5 C)-97.8 F (36.6 C)] 97.7 F (36.5 C) (04/01 0444) Pulse Rate:  [80-98] 90 (04/01 0444) Cardiac Rhythm: Normal sinus rhythm (04/01 0709) Resp:  [17-18] 18 (04/01 0444) BP: (98-120)/(50-68) 112/55 (04/01 0444) SpO2:  [95 %-100 %] 96 % (04/01 0444) Weight:  [88.3 kg (194 lb 11.2 oz)] 88.3 kg (194 lb 11.2 oz) (04/01 0441)  CBC:   Recent Labs Lab 06/21/16 0538 06/22/16 0234  WBC 7.5 6.5  HGB 8.7* 8.9*  HCT 26.1* 27.2*  MCV 86.1 86.6  PLT 137* 409    Basic Metabolic Panel:  Recent Labs Lab 06/18/16 0316 06/18/16 1730  06/21/16 0538 06/22/16 0234  NA 137  --   < > 133* 136  K 3.8  --   < > 4.2 3.8  CL 105  --   < > 98* 104  CO2 23  --   < > 24 24  GLUCOSE 123*  --   < > 183* 103*  BUN 12  --   < > 24* 25*   CREATININE 0.93 0.99  < > 1.24 1.11  CALCIUM 7.8*  --   < > 7.9* 7.9*  MG 2.4 2.3  --   --   --   < > = values in this interval not displayed.  Lipid Panel:     Component Value Date/Time   CHOL 97 06/22/2016 0234   TRIG 142 06/22/2016 0234   HDL 24 (L) 06/22/2016 0234   CHOLHDL 4.0 06/22/2016 0234   VLDL 28 06/22/2016 0234   LDLCALC 45 06/22/2016 0234   HgbA1c:  Lab Results  Component Value Date   HGBA1C 13.6 (H) 06/06/2016   Urine Drug Screen:     Component Value Date/Time   LABOPIA NONE DETECTED 03/29/2016 0903   COCAINSCRNUR NONE DETECTED 03/29/2016 0903   LABBENZ NONE DETECTED 03/29/2016 0903   AMPHETMU NONE DETECTED 03/29/2016 0903   THCU NONE DETECTED 03/29/2016 0903   LABBARB NONE DETECTED 03/29/2016 0903      IMAGING  Dg Chest 2 View 06/22/2016 Stable left basilar atelectasis and minimally improved right basilar atelectasis.     Dg Chest 2 View 06/21/2016 Near complete clearing of interstitial edema compared to 1 day prior. Small pleural effusions bilaterally are noted with bibasilar atelectasis. Stable cardiac prominence. Aortic atherosclerosis.  No pneumothorax.     Ct Head Wo Contrast 06/21/2016 No intracranial hemorrhage mass effect or midline shift. There is area of decreased attenuation in right occipital lobe measures about 3 cm please see axial image 14 and sagittal image 23. This is highly suspicious for evolving ischemia/ infarct. Further correlation with brain MRI is recommended as clinically warranted. No definite mass lesion is noted on this unenhanced scan. This was made a call report.    TEE echocardiogram (OR) 06/17/2016  Left ventricle: Normal cavity size and wall thickness. LV systolic function is low normal with an EF of 50-55%. Wall motion is abnormal. Inferior wall motion is hypokinetic. Inferolateral wall motion is hypokinetic. No thrombus present. No mass present.  Aortic valve: No AV vegetation.  Mitral valve: No leaflet  thickening and calcification present. Mild regurgitation. There is mild prolapse of the posterior mitral leaflet. Holosystolic mitral valve prolapse.  Right ventricle: Normal cavity size, wall thickness and ejection fraction.  Left ventricle: Normal cavity size.   Carotid Dopplers 03/29/2016 Right: Heterogeneous and partially calcified plaque at the carotid bifurcation, with discordant results regarding degree of stenosis by established duplex criteria. Peak velocity suggests 50- 69% stenosis, with the ICA/ CCA ratio suggesting a lesser degree of stenosis. If establishing a more accurate degree of stenosis is required, cerebral angiogram should be considered, or as a second best test, CTA. Left: Color duplex indicates moderate heterogeneous and calcified plaque, with no hemodynamically significant stenosis by duplex criteria in the left extracranial cerebrovascular circulation.  LEFT AND RIGHT VERTEBRAL ARTERIES:  Antegrade flow with low resistance waveform.   MR MRA Head Brain Wo Contrast 03/29/2016 1. Mild atheromatous irregularity within the basilar artery without flow-limiting stenosis. 2. Otherwise normal intracranial MRA.   MRI Brain Wo Contrast 03/29/2016 1. Acute lacunar type infarct in the left lower brainstem at the pontomedullary junction (series 3, image 71), probably affecting the left facial nerve motor nucleus in this clinical setting. No associated hemorrhage or mass effect. 2. Generalized intracranial artery dolichoectasia. 3. Otherwise largely unremarkable for age noncontrast MRI appearance of the brain.    PHYSICAL EXAM Pleasant middle aged caucasian male not in distress.  . Afebrile. Head is nontraumatic. Neck is supple without bruit.    Cardiac exam no murmur or gallop. Lungs are clear to auscultation. Distal pulses are well felt.  Neurological Exam ;  Awake  Alert oriented x 3. Normal speech and language.Diminished recall 2/3. .eye movements full  without nystagmus.fundi were not visualized. Vision acuity   appears normal.  Dense left homonymous hemianopiaHearing is normal. Palatal movements are normal. Face symmetric. Tongue midline. Normal strength, tone, reflexes and coordination. Normal sensation. Gait deferred.      ASSESSMENT/PLAN Mr. Drew Harrison is a 66 y.o. male with history of coronary artery disease, status post CABG 06/17/2016, previous stroke in January of this year (Dr Merlene Laughter), hypertension, hyperlipidemia, diabetes mellitus, and gout  presenting with visual changes. He did not receive IV t-PA due to recent surgery.  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  Resultant  Dense left homonymous hemianopsia  MRI - not performed  MRA - not performed  Carotid Doppler - 03/29/2016 - 50 - 69% right internal carotid artery stenosis.  TEE Echo - (OR) 06/17/2016 - EF 50-55%. No thrombus present.  LDL - 45  HgbA1c - pending  VTE prophylaxis - SCDs Diet heart healthy/carb modified Room service appropriate? Yes; Fluid consistency: Thin  aspirin 81 mg daily and clopidogrel 75 mg daily prior  to admission, now on aspirin 81 mg daily and clopidogrel 75 mg daily  Patient counseled to be compliant with his antithrombotic medications  Ongoing aggressive stroke risk factor management  Therapy recommendations: home health  Disposition: home  Hypertension  Blood pressure tends to run low (currently not on antihypertensive medications)  Permissive hypertension (OK if < 220/120) but gradually normalize in 5-7 days  Long-term BP goal normotensive  Hyperlipidemia  Home meds:  Lipitor 40 mg daily resumed in hospital  LDL 45, goal < 70  Continue statin at discharge  Diabetes  HgbA1c pending, goal < 7.0  Unc / Controlled  Other Stroke Risk Factors  Advanced age  The patient quit smoking 6 weeks ago  ETOH use, advised to drink no more than 1 to 2 drinks per day.  Overweight, Body mass index  is 26.41 kg/m., recommend weight loss, diet and exercise as appropriate   Hx stroke/TIA  Coronary artery disease   Other Active Problems  Postop anemia - 8.9 / 27.2  Mild hypotension  Moderate right internal carotid artery disease  Probable mild dehydration - BUN 25  Hospital day # 6  I have personally examined this patient, reviewed notes, independently viewed imaging studies, participated in medical decision making and plan of care.ROS completed by me personally and pertinent positives fully documented  I have made any additions or clarifications directly to the above note.   He has developed left-sided homonymous hemianopsia secondary to right posterior cerebral artery infarct in the perioperative period. Recommend continue dual antiplatelet therapy given his cardiac disease. Patient was advised not to drive till his peripheral vision loss improves.F/U as outpatient in stroke clinic in 6 weeks Maintain aggressive risk factor modification. Stroke team will sign off. Call for questions. Antony Contras, MD  To contact Stroke Continuity provider, please refer to [email or web address] After hours, contact General Neurology

## 2016-06-23 NOTE — Progress Notes (Addendum)
[address]       [place] [address]             [phone]      6 Days Post-Op Procedure(s) (LRB): CORONARY ARTERY BYPASS GRAFTING (CABG) x four using left internal mammary artery and right greater saphenous leg vein using endoscope. (N/A) TRANSESOPHAGEAL ECHOCARDIOGRAM (TEE) (N/A) Subjective: Feels ok, mood somewhat improved this am . Vision issues unchanged  Objective: Vital signs in last 24 hours: Temp:  [97.7 F (36.5 C)-97.8 F (36.6 C)] 97.7 F (36.5 C) (04/01 0444) Pulse Rate:  [80-98] 90 (04/01 0444) Cardiac Rhythm: Normal sinus rhythm (04/01 0709) Resp:  [17-18] 18 (04/01 0444) BP: (98-120)/(50-68) 112/55 (04/01 0444) SpO2:  [95 %-100 %] 96 % (04/01 0444) Weight:  [194 lb 11.2 oz (88.3 kg)] 194 lb 11.2 oz (88.3 kg) (04/01 0441)  Hemodynamic parameters for last 24 hours:    Intake/Output from previous day: 03/31 0701 - 04/01 0700 In: 1083 [P.O.:1080; I.V.:3] Out: -  Intake/Output this shift: No intake/output data recorded.  General appearance: alert, cooperative and no distress Heart: regular rate and rhythm Lungs: clear to auscultation bilaterally Abdomen: benign Extremities: + minor edema Wound: incis healing well  Lab Results:  Recent Labs  06/21/16 0538 06/22/16 0234  WBC 7.5 6.5  HGB 8.7* 8.9*  HCT 26.1* 27.2*  PLT 137* 155   BMET:  Recent Labs  06/21/16 0538 06/22/16 0234  NA 133* 136  K 4.2 3.8  CL 98* 104  CO2 24 24  GLUCOSE 183* 103*  BUN 24* 25*  CREATININE 1.24 1.11  CALCIUM 7.9* 7.9*    PT/INR: No results for input(s): LABPROT, INR in the last 72 hours. ABG    Component Value Date/Time   PHART 7.368 06/17/2016 2207   HCO3 22.6 06/17/2016 2207   TCO2 25 06/19/2016 1807   ACIDBASEDEF 3.0 (H) 06/17/2016 2207   O2SAT 12.6 06/22/2016 1345   CBG (last 3)   Recent Labs  06/22/16 1631 06/22/16 2117 06/23/16 0614  GLUCAP 111* 151* 106*    Meds Scheduled Meds: . aspirin EC  81 mg Oral Daily    . atorvastatin  40 mg Oral q1800  . bisacodyl  10 mg Oral Daily   Or  . bisacodyl  10 mg Rectal Daily  . clopidogrel  75 mg Oral Daily  . docusate sodium  200 mg Oral Daily  . ferrous [id number] C-folic acid  1 capsule Oral BID  . furosemide  40 mg Intravenous Daily  . guaiFENesin  600 mg Oral BID  . insulin aspart  0-24 Units Subcutaneous TID AC & HS  . insulin aspart  3 Units Subcutaneous TID WC  . insulin detemir  18 Units Subcutaneous BID  . mouth rinse  15 mL Mouth Rinse BID  . metFORMIN  500 mg Oral BID WC  . metoprolol tartrate  25 mg Oral BID  . pantoprazole  40 mg Oral Daily  . potassium chloride  20 mEq Oral BID  . sodium chloride flush  3 mL Intravenous Q12H  . sodium chloride flush  3 mL Intravenous Q12H   Continuous Infusions: . sodium chloride Stopped (06/18/16 0800)  . sodium chloride    . sodium chloride 10 mL/hr at 06/18/16 0500  . lactated ringers 10 mL/hr at 06/18/16 2200  . lactated ringers Stopped (06/17/16 1800)   PRN Meds:.sodium chloride, sodium chloride, levalbuterol, metoprolol, midazolam, ondansetron (ZOFRAN) IV, oxyCODONE, sodium chloride flush, sodium chloride flush, sodium  chloride flush, traMADol  Xrays Dg Chest 2 View  Result Date: 06/22/2016 CLINICAL DATA:  Status post CABG. EXAM: CHEST  2 VIEW COMPARISON:  Yesterday. FINDINGS: The right jugular Swan-Ganz catheter sheath has been removed. No pneumothorax. Stable post CABG changes. Normal sized heart. Decreased right basilar atelectasis. Stable left basilar atelectasis. Thoracic spine degenerative changes. Diffuse osteopenia. IMPRESSION: Stable left basilar atelectasis and minimally improved right basilar atelectasis. Electronically Signed   By: Claudie Revering M.D.   On: 06/22/2016 07:52   Ct Head Wo Contrast  Result Date: 06/21/2016 CLINICAL DATA:  Blurry vision, intermittent headache for the last 2 days, status post CABG 06/17/2016 EXAM: CT HEAD WITHOUT CONTRAST TECHNIQUE: Contiguous  axial images were obtained from the base of the skull through the vertex without intravenous contrast. COMPARISON:  None. FINDINGS: Brain: No intracranial hemorrhage mass effect or midline shift. There is area of decreased attenuation in right occipital lobe measures about 3 cm please see axial image 14 and sagittal image 23. This is highly suspicious for evolving ischemia/ infarct. Further correlation with brain MRI is recommended as clinically warranted. No definite mass lesion is noted on this unenhanced scan. Vascular: Atherosclerotic calcifications of carotid siphon are noted. Skull: No skull fracture. Sinuses/Orbits: No acute findings Other: None IMPRESSION: No intracranial hemorrhage mass effect or midline shift. There is area of decreased attenuation in right occipital lobe measures about 3 cm please see axial image 14 and sagittal image 23. This is highly suspicious for evolving ischemia/ infarct. Further correlation with brain MRI is recommended as clinically warranted. No definite mass lesion is noted on this unenhanced scan. This was made a call report. Electronically Signed   By: Lahoma Crocker M.D.   On: 06/21/2016 10:36    Assessment/Plan: S/P Procedure(s) (LRB): CORONARY ARTERY BYPASS GRAFTING (CABG) x four using left internal mammary artery and right greater saphenous leg vein using endoscope. (N/A) TRANSESOPHAGEAL ECHOCARDIOGRAM (TEE) (N/A)  1 doing well overall with steady rehabilitation progress, visual changes are stable- will need rehab at discharge, says he is going to [facility]- will get SW consult 2 routine pulm toilet 3 sugar control is reasonable currently 4 hemodyn stable in sinus rhythm  LOS: 6 days    Harrison,Drew E 06/23/2016   Chart reviewed, patient examined, agree with above.

## 2016-06-24 ENCOUNTER — Inpatient Hospital Stay (HOSPITAL_COMMUNITY): Payer: PPO

## 2016-06-24 LAB — GLUCOSE, CAPILLARY
Glucose-Capillary: 105 mg/dL — ABNORMAL HIGH (ref 65–99)
Glucose-Capillary: 176 mg/dL — ABNORMAL HIGH (ref 65–99)
Glucose-Capillary: 150 mg/dL — ABNORMAL HIGH (ref 65–99)
Glucose-Capillary: 165 mg/dL — ABNORMAL HIGH (ref 65–99)

## 2016-06-24 MED ORDER — FUROSEMIDE 40 MG PO TABS
40.0000 mg | ORAL_TABLET | Freq: Every day | ORAL | Status: DC
  Administered 2016-06-24 – 2016-06-26 (×3): 40 mg via ORAL
  Filled 2016-06-24 (×3): qty 1

## 2016-06-24 MED ORDER — POTASSIUM CHLORIDE CRYS ER 20 MEQ PO TBCR
20.0000 meq | EXTENDED_RELEASE_TABLET | Freq: Every day | ORAL | Status: DC
  Administered 2016-06-24 – 2016-06-26 (×3): 20 meq via ORAL
  Filled 2016-06-24 (×3): qty 1

## 2016-06-24 MED ORDER — LISINOPRIL 5 MG PO TABS
5.0000 mg | ORAL_TABLET | Freq: Every day | ORAL | Status: DC
  Administered 2016-06-24: 5 mg via ORAL
  Filled 2016-06-24: qty 1

## 2016-06-24 NOTE — Progress Notes (Signed)
Cancelled carotid duplex order per Dr. Leonie Man.. Patient has had two previous studies this year one  03/2016 and a second 06/04/16 at two different facilities with essentially the same results. These results can be found in EPIC under results reviewed.  Thank-you [place] D. [place], [place]  06/24/2016 09:20 AM

## 2016-06-24 NOTE — Progress Notes (Signed)
SLP Cancellation Note  Patient Details Name: Drew Harrison MRN: [id number] DOB: 03/22/1951   Cancelled treatment:       Reason Eval/Treat Not Completed: SLP screened, no needs identified, will sign off   Juan Quam Laurice 06/24/2016, 3:57 PM

## 2016-06-24 NOTE — Progress Notes (Signed)
   06/24/16 1127  Clinical Encounter Type  Visited With Patient  Visit Type Follow-up  Referral From Nurse  Consult/Referral To Chaplain  Advance Directives (For Healthcare)  Does Patient Have a Medical Advance Directive? No  Would patient like information on creating a medical advance directive? No - Patient declined  Pt.Decline AD. Not interested at this time, wants to talk to children when gets out of hospital.   Chaplain Timmie Calix A. Camillia Marcy, MA-PC, BA-REL/PHIL, [phone]

## 2016-06-24 NOTE — Progress Notes (Addendum)
      [address]       [place] [address]             [phone]      7 Days Post-Op Procedure(s) (LRB): CORONARY ARTERY BYPASS GRAFTING (CABG) x four using left internal mammary artery and right greater saphenous leg vein using endoscope. (N/A) TRANSESOPHAGEAL ECHOCARDIOGRAM (TEE) (N/A)   Subjective:  No new complaints.  Continues to complain of vision changes, which have not improved.  + ambulation  + BM      Lives alone, wants SNF at discharge which would be beneficial until vision changes resolve.  Objective: Vital signs in last 24 hours: Temp:  [98.2 F (36.8 C)-98.6 F (37 C)] 98.2 F (36.8 C) (04/02 0606) Pulse Rate:  [88-95] 88 (04/02 0606) Cardiac Rhythm: Normal sinus rhythm (04/02 0722) Resp:  [16-18] 16 (04/02 0606) BP: (119-137)/(53-60) 137/56 (04/02 0606) SpO2:  [96 %-98 %] 98 % (04/02 0606) Weight:  [192 lb 4.8 oz (87.2 kg)] 192 lb 4.8 oz (87.2 kg) (04/02 0606)  Intake/Output from previous day: 04/01 0701 - 04/02 0700 In: 423 [P.O.:420; I.V.:3] Out: -   General appearance: alert, cooperative and no distress Heart: regular rate and rhythm Lungs: clear to auscultation bilaterally Abdomen: soft, non-tender; bowel sounds normal; no masses,  no organomegaly Extremities: edema trace Wound: clean and dry  Lab Results:  Recent Labs  06/22/16 0234  WBC 6.5  HGB 8.9*  HCT 27.2*  PLT 155   BMET:  Recent Labs  06/22/16 0234  NA 136  K 3.8  CL 104  CO2 24  GLUCOSE 103*  BUN 25*  CREATININE 1.11  CALCIUM 7.9*    PT/INR: No results for input(s): LABPROT, INR in the last 72 hours. ABG    Component Value Date/Time   PHART 7.368 06/17/2016 2207   HCO3 22.6 06/17/2016 2207   TCO2 25 06/19/2016 1807   ACIDBASEDEF 3.0 (H) 06/17/2016 2207   O2SAT 12.6 06/22/2016 1345   CBG (last 3)   Recent Labs  06/23/16 1644 06/23/16 2104 06/24/16 0601  GLUCAP 175* 86 105*    Assessment/Plan: S/P Procedure(s) (LRB): CORONARY ARTERY  BYPASS GRAFTING (CABG) x four using left internal mammary artery and right greater saphenous leg vein using endoscope. (N/A) TRANSESOPHAGEAL ECHOCARDIOGRAM (TEE) (N/A)  1. CV- NSR, mild tachy- continue lopressor, will add lisinopril for additional BP coverage 2. Pulm- no acute issues, continue IS 3. Renal- creatinine is improving, weight is trending down, will change to oral Lasix, continue potassium supplementation 4. Neuro- post infarct cabg, vision changes remain problematic 5. DM- uncontrolled, sugars okay in hospital, continue current regimen 6. Deconditioning- wants SNF, lives alone, vision changes problematic, social work consult placed 7. Dispo- patient stable, will await CSW consult, monitor vision changes, continue current care.   LOS: 7 days    Ellwood Handler 06/24/2016  Agree with above evaluation and plan Surgical incisions clean and dry Peripheral edema significantly improved Pulmonary status continues to improve now on room air Remains somewhat depressed over visual symptoms Ready for rehabilitation placement  Tharon Aquas trigt M.D.

## 2016-06-24 NOTE — Progress Notes (Signed)
CARDIAC REHAB PHASE I   PRE:  Rate/Rhythm: 92 SR    BP: sitting 139/73    SaO2: 95 RA  MODE:  Ambulation: 550 ft   POST:  Rate/Rhythm: 112 ST    BP: sitting 152/66     SaO2: 95 RA  Tolerated well with RW. Steady, no c/o. Can be independent. Ed completed with pt. Voiced understanding but is frustrated. Encouraged him to f/u with PCP for DM control. Discussed diet. He has quit smoking.  [place], ACSM 06/24/2016 10:09 AM

## 2016-06-25 LAB — GLUCOSE, CAPILLARY
Glucose-Capillary: 101 mg/dL — ABNORMAL HIGH (ref 65–99)
Glucose-Capillary: 185 mg/dL — ABNORMAL HIGH (ref 65–99)
Glucose-Capillary: 82 mg/dL (ref 65–99)
Glucose-Capillary: 196 mg/dL — ABNORMAL HIGH (ref 65–99)

## 2016-06-25 MED ORDER — LISINOPRIL 10 MG PO TABS
10.0000 mg | ORAL_TABLET | Freq: Every day | ORAL | Status: DC
  Administered 2016-06-25 – 2016-06-26 (×2): 10 mg via ORAL
  Filled 2016-06-25 (×2): qty 1

## 2016-06-25 NOTE — NC FL2 (Signed)
[place] LEVEL OF CARE SCREENING TOOL     IDENTIFICATION  Patient Name: Drew Harrison Birthdate: 06-Jul-1950 Sex: male Admission Date (Current Location): 06/17/2016  [facility] [place] Number:  Herbalist and Address:  [facility], [place] [address] [place], [place] [address]      Provider Number: [id number]  Attending Physician Name and Address:  [name], MD  Relative Name and Phone Number:       Current Level of Care: Hospital Recommended Level of Care: [place] Prior Approval Number:    Date Approved/Denied:   PASRR Number: [id number]  Discharge Plan: SNF    Current Diagnoses: Patient Active Problem List   Diagnosis Date Noted  . 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 3-vessel CAD 06/04/2016  . Abnormal nuclear stress test 04/08/2016  . Cerebrovascular accident (CVA) ([place]) 03/29/2016  . Ischemic stroke ([place]) 03/29/2016  . Essential hypertension   . H/O tobacco use, presenting hazards to health     Orientation RESPIRATION BLADDER Height & Weight     Self, Time, Situation  Normal Continent Weight: 190 lb 14.7 oz (86.6 kg) Height:  6' (182.9 cm)  BEHAVIORAL SYMPTOMS/MOOD NEUROLOGICAL BOWEL NUTRITION STATUS      Continent Diet (Heart Healthy)  AMBULATORY STATUS COMMUNICATION OF NEEDS Skin   Limited Assist Verbally Normal                       Personal Care Assistance Level of Assistance  Bathing, Feeding, Dressing Bathing Assistance: Independent Feeding assistance: Independent Dressing Assistance: Limited assistance     Functional Limitations Info  Sight, Hearing, Speech Sight Info: Impaired Hearing Info: Adequate Speech Info: Adequate    SPECIAL CARE FACTORS FREQUENCY  PT (By licensed PT)     PT Frequency: 3x week OT Frequency: 3x week            Contractures Contractures Info: Not present    Additional Factors Info  Code Status Code Status Info: Full Code              Current Medications (06/25/2016):  This is the current hospital active medication list Current Facility-Administered Medications  Medication Dose Route Frequency Provider Last Rate Last Dose  . 0.45 % sodium chloride infusion   Intravenous Continuous PRN Elgie Collard, PA-C   Stopped at 06/18/16 0800  . 0.9 %  sodium chloride infusion  250 mL Intravenous Continuous Tessa N Conte, PA-C      . 0.9 %  sodium chloride infusion   Intravenous Continuous Elgie Collard, PA-C 10 mL/hr at 06/18/16 0500    . 0.9 %  sodium chloride infusion  250 mL Intravenous PRN [name], MD      . alum & mag hydroxide-simeth (MAALOX/MYLANTA) 200-200-20 MG/5ML suspension 15 mL  15 mL Oral PRN [name], MD   15 mL at 06/23/16 1509  . aspirin EC tablet 81 mg  81 mg Oral Daily [name], MD   81 mg at 06/25/16 0933  . atorvastatin (LIPITOR) tablet 40 mg  40 mg Oral q1800 Elgie Collard, PA-C   40 mg at 06/24/16 1710  . bisacodyl (DULCOLAX) EC tablet 10 mg  10 mg Oral Daily Elgie Collard, PA-C   10 mg at 06/22/16 1058   Or  . bisacodyl (DULCOLAX) suppository 10 mg  10 mg Rectal Daily Elgie Collard, PA-C      .  clopidogrel (PLAVIX) tablet 75 mg  75 mg Oral Daily [name], MD   75 mg at 06/25/16 0933  . docusate sodium (COLACE) capsule 200 mg  200 mg Oral Daily Elgie Collard, PA-C   200 mg at 06/22/16 1059  . ferrous [id number] C-folic acid (TRINSICON / FOLTRIN) capsule 1 capsule  1 capsule Oral BID [name], MD   1 capsule at 06/25/16 0734  . furosemide (LASIX) tablet 40 mg  40 mg Oral Daily Erin R Barrett, PA-C   40 mg at 06/25/16 0933  . guaiFENesin (MUCINEX) 12 hr tablet 600 mg  600 mg Oral BID [name], MD   600 mg at 06/25/16 0933  . insulin aspart (novoLOG) injection 0-24 Units  0-24 Units Subcutaneous TID AC & HS [name], MD   4 Units at 06/24/16 2152  . insulin aspart (novoLOG) injection 3 Units  3 Units Subcutaneous TID WC [name], MD   3 Units at  06/24/16 1710  . insulin detemir (LEVEMIR) injection 18 Units  18 Units Subcutaneous BID [name], MD   18 Units at 06/25/16 [phone]  . lactated ringers infusion   Intravenous Continuous Elgie Collard, PA-C 10 mL/hr at 06/18/16 2200    . lactated ringers infusion   Intravenous Continuous Elgie Collard, PA-C   Stopped at 06/17/16 1800  . levalbuterol (XOPENEX) nebulizer solution 0.63 mg  0.63 mg Nebulization Q4H PRN Elgie Collard, PA-C      . lisinopril (PRINIVIL,ZESTRIL) tablet 10 mg  10 mg Oral Daily Erin R Barrett, PA-C   10 mg at 06/25/16 0933  . MEDLINE mouth rinse  15 mL Mouth Rinse BID [name], MD   15 mL at 06/23/16 1000  . metFORMIN (GLUCOPHAGE) tablet 500 mg  500 mg Oral BID WC Wayne E Gold, PA-C   500 mg at 06/25/16 0734  . metoprolol (LOPRESSOR) injection 2.5-5 mg  2.5-5 mg Intravenous Q2H PRN Elgie Collard, PA-C      . metoprolol tartrate (LOPRESSOR) tablet 25 mg  25 mg Oral BID [name], MD   25 mg at 06/25/16 0933  . midazolam (VERSED) injection 2 mg  2 mg Intravenous Q1H PRN Elgie Collard, PA-C      . ondansetron (ZOFRAN) injection 4 mg  4 mg Intravenous Q6H PRN Elgie Collard, PA-C      . oxyCODONE (Oxy IR/ROXICODONE) immediate release tablet 5-10 mg  5-10 mg Oral Q3H PRN Elgie Collard, PA-C   10 mg at 06/24/16 2014  . pantoprazole (PROTONIX) EC tablet 40 mg  40 mg Oral Daily Elgie Collard, PA-C   40 mg at 06/25/16 0933  . potassium chloride SA (K-DUR,KLOR-CON) CR tablet 20 mEq  20 mEq Oral Daily Erin R Barrett, PA-C   20 mEq at 06/25/16 0935  . sodium chloride flush (NS) 0.9 % injection 10-40 mL  10-40 mL Intracatheter PRN [name], MD   10 mL at 06/21/16 0554  . sodium chloride flush (NS) 0.9 % injection 3 mL  3 mL Intravenous Q12H Elgie Collard, PA-C   3 mL at 06/25/16 0933  . sodium chloride flush (NS) 0.9 % injection 3 mL  3 mL Intravenous PRN Elgie Collard, PA-C      . sodium chloride flush (NS) 0.9 % injection 3 mL  3 mL Intravenous Q12H [name], MD   3 mL at 06/24/16 2153  .  sodium chloride flush (NS) 0.9 % injection 3 mL  3 mL Intravenous PRN [name], MD      . traMADol Veatrice Bourbon) tablet 50-100 mg  50-100 mg Oral Q4H PRN Elgie Collard, PA-C         Discharge Medications: Please see discharge summary for a list of discharge medications.  Relevant Imaging Results:  Relevant Lab Results:   Additional Information SS#[id number]  Wende Neighbors, LCSW

## 2016-06-25 NOTE — Clinical Social Work Placement (Signed)
   CLINICAL SOCIAL WORK PLACEMENT  NOTE  Date:  06/25/2016  Patient Details  Name: Drew Harrison MRN: [id number] Date of Birth: 01/13/1951  Clinical Social Work is seeking post-discharge placement for this patient at the [place] level of care (*CSW will initial, date and re-position this form in  chart as items are completed):  Yes   Patient/family provided with [facility] Work Department's list of facilities offering this level of care within the geographic area requested by the patient (or if unable, by the patient's family).  Yes   Patient/family informed of their freedom to choose among providers that offer the needed level of care, that participate in Medicare, Medicaid or managed care program needed by the patient, have an available bed and are willing to accept the patient.  Yes   Patient/family informed of [facility]'s ownership interest in [facility] and [facility], as well as of the fact that they are under no obligation to receive care at these facilities.  PASRR submitted to EDS on       PASRR number received on       Existing PASRR number confirmed on       FL2 transmitted to all facilities in geographic area requested by pt/family on       FL2 transmitted to all facilities within larger geographic area on       Patient informed that his/her managed care company has contracts with or will negotiate with certain facilities, including the following:            Patient/family informed of bed offers received.  Patient chooses bed at       Physician recommends and patient chooses bed at      Patient to be transferred to   on  .  Patient to be transferred to facility by       Patient family notified on   of transfer.  Name of family member notified:        PHYSICIAN Please sign FL2     Additional Comment:    _______________________________________________ Wende Neighbors, LCSW 06/25/2016, 11:48 AM

## 2016-06-25 NOTE — Progress Notes (Signed)
Occupational Therapy Treatment Patient Details Name: ANDERS HOHMANN MRN: [id number] DOB: [birth year] Today's Date: 06/25/2016    History of present illness ANTWAINE BOOMHOWER is an 66 y.o. male 4 Days Post-Op CORONARY ARTERY BYPASS GRAFTING. Post surgery patient states that he had some visual changes. CT of brain was obtained showing an area of decreased attenuation in the right occipital lobe that measures about 3 cm. PMH:  CAD, HTN, DM, CVA.     OT comments  Pt continues with vision deficits, and continues to express significant frustration.  He demonstrates flat affect and is difficult to engage - feel he would benefit from psych consult.   He is able to perform ADLs mod I, in his hospital room, but is unable to dial phone accurately, read and manage medication, and likely will be unable to prepare meals safely.  He does not have adequate support at discharge, so therefore, recommend SNF level rehab at discharge.     Follow Up Recommendations  SNF;Supervision - Intermittent    Equipment Recommendations  Tub/shower seat    Recommendations for Other Services      Precautions / Restrictions Precautions Precautions: Fall;Sternal Restrictions Weight Bearing Restrictions: No       Mobility Bed Mobility               General bed mobility comments: sitting in chair upon arrival  Transfers Overall transfer level: Independent Equipment used: None                  Balance Overall balance assessment: Needs assistance Sitting-balance support: No upper extremity supported;Feet supported Sitting balance-Leahy Scale: Good     Standing balance support: During functional activity Standing balance-Leahy Scale: Fair Standing balance comment: able to stand static without loss of balance, ambulating with rw                           ADL either performed or assessed with clinical judgement   ADL                                         General ADL  Comments: Pt reports he is self feeding, and self toileting.  He refuses bath this am.       Vision   Additional Comments: Pt keeps his eyes closed majority of the time, stating he didn't sleep well, and only wants to sleep.  With encouragement, he states he can't read info, but was able to read 2 paragraphs of info in bold 18 font print size, but states since he can't see it clearly and it is effortful that essentially he can't see.   Attempted to have pt try to read smaller print, but he would not attempt.  Using a page magnifier (2-3x), pt able to read medium size print at single word level only.   He was able to read the name on a medication bottle that was printed in large print, but unable to read remaining print    Perception     Praxis      Cognition Arousal/Alertness: Awake/alert Behavior During Therapy: Flat affect Overall Cognitive Status: Within Functional Limits for tasks assessed  Exercises     Shoulder Instructions       General Comments Pt continues to voice frustration with deficits and situation, and continues to be flat, difficult to engage.      Pertinent Vitals/ Pain       Pain Assessment: No/denies pain  Home Living                                          Prior Functioning/Environment              Frequency  Min 3X/week        Progress Toward Goals  OT Goals(current goals can now be found in the care plan section)  Progress towards OT goals: Progressing toward goals  Acute Rehab OT Goals Patient Stated Goal: to be able to see   Plan Discharge plan needs to be updated    Co-evaluation                 End of Session    OT Visit Diagnosis: Low vision, both eyes (H54.2)   Activity Tolerance Patient tolerated treatment well   Patient Left in bed;with call bell/phone within reach   Nurse Communication Mobility status        Time: 1006-1030 OT Time  Calculation (min): 24 min  Charges: OT General Charges $OT Visit: 1 Procedure OT Treatments $Therapeutic Activity: 23-37 mins  [facility], OTR/L [phone]    Lucille Passy M 06/25/2016, 11:25 AM

## 2016-06-25 NOTE — Care Management Important Message (Signed)
Important Message  Patient Details  Name: Drew Harrison MRN: [id number] Date of Birth: 02-04-1951   Medicare Important Message Given:  Yes    [name] 06/25/2016, 2:12 PM

## 2016-06-25 NOTE — Progress Notes (Signed)
CARDIAC REHAB PHASE I   PRE:  Rate/Rhythm: 82 SR    BP: sitting 120/70    SaO2: 94 RA  MODE:  Ambulation: 550 ft   POST:  Rate/Rhythm: 102 ST    BP: sitting 140/59     SaO2: 97 RA  Tolerated fairly well. Slightly unsteady at times without RW but able to compensate. Pt doesn't feel as secure. Had a quicker pace without RW. Stopped to rest x1 due to SOB. Overall did well. To recliner.  [phone]   [place], ACSM 06/25/2016 12:06 PM

## 2016-06-25 NOTE — Progress Notes (Signed)
qPhysical Therapy Treatment Patient Details Name: Drew Harrison MRN: [id number] DOB: 1950/06/06 Today's Date: 06/25/2016    History of Present Illness Drew Harrison is an 66 y.o. male 4 Days Post-Op CORONARY ARTERY BYPASS GRAFTING. Post surgery patient states that he had some visual changes. CT of brain was obtained showing an area of decreased attenuation in the right occipital lobe that measures about 3 cm. PMH:  CAD, HTN, DM, CVA.      PT Comments    At this time the pt is making gradual progress with mobility, continuing to use rw for support. No gross loss of balance but pt confirms feeling unsteady. Additionally, pt reports having difficulty and frustration with vision, making navigation and ADLs difficult. Based upon the patient's current functional level, recommending SNF for further care following acute stay. Pt in agreement. PT to continue to follow.    Follow Up Recommendations  SNF     Equipment Recommendations  Rolling walker with 5" wheels    Recommendations for Other Services       Precautions / Restrictions Precautions Precautions: Fall;Sternal Restrictions Weight Bearing Restrictions: No    Mobility  Bed Mobility               General bed mobility comments: sitting in chair upon arrival  Transfers Overall transfer level: Independent Equipment used: None                Ambulation/Gait Ambulation/Gait assistance: Min Dispensing optician (Feet): 250 Feet Assistive device: Rolling walker (2 wheeled) Gait Pattern/deviations: Step-through pattern;Decreased stride length Gait velocity: decreased   General Gait Details: Pt reports that he feels more secure when using rw. States that his legs feel weak. SpO2 in 90 after ambulation.    Stairs            Wheelchair Mobility    Modified Rankin (Stroke Patients Only)       Balance Overall balance assessment: Needs assistance Sitting-balance support: No upper  extremity supported;Feet supported Sitting balance-Leahy Scale: Good     Standing balance support: During functional activity Standing balance-Leahy Scale: Fair Standing balance comment: able to stand static without loss of balance, ambulating with rw                            Cognition Arousal/Alertness: Awake/alert Behavior During Therapy: Flat affect Overall Cognitive Status: Within Functional Limits for tasks assessed                                        Exercises      General Comments General comments (skin integrity, edema, etc.): Pt reports vision deficits making ambulation difficult.       Pertinent Vitals/Pain Pain Assessment: No/denies pain    Home Living                      Prior Function            PT Goals (current goals can now be found in the care plan section) Acute Rehab PT Goals Patient Stated Goal: to be able to see  PT Goal Formulation: With patient Time For Goal Achievement: 06/29/16 Potential to Achieve Goals: Good Progress towards PT goals: Progressing toward goals    Frequency    Min 3X/week      PT Plan Discharge plan needs to be updated  Co-evaluation             End of Session Equipment Utilized During Treatment: Gait belt Activity Tolerance: Patient limited by fatigue Patient left: in chair;with call bell/phone within reach Nurse Communication: Mobility status PT Visit Diagnosis: Other abnormalities of gait and mobility (R26.89)     Time: 2179-8102 PT Time Calculation (min) (ACUTE ONLY): 16 min  Charges:  $Gait Training: 8-22 mins                    G Codes:      Cassell Clement, PT, CSCS Pager [phone] Office [place] 06/25/2016, 9:06 AM

## 2016-06-25 NOTE — Progress Notes (Addendum)
      [address]       [place] [address]             [phone]      8 Days Post-Op Procedure(s) (LRB): CORONARY ARTERY BYPASS GRAFTING (CABG) x four using left internal mammary artery and right greater saphenous leg vein using endoscope. (N/A) TRANSESOPHAGEAL ECHOCARDIOGRAM (TEE) (N/A)   Subjective:  Remains frustrated over visual disturbances.  He remains unable to read and isn't even able to pick things up.  Social work did not speak with him yesterday.  Objective: Vital signs in last 24 hours: Temp:  [98 F (36.7 C)-98.6 F (37 C)] 98.4 F (36.9 C) (04/03 0550) Pulse Rate:  [85-95] 89 (04/03 0550) Cardiac Rhythm: Normal sinus rhythm;Bundle branch block (04/03 0725) Resp:  [18] 18 (04/03 0550) BP: (113-142)/(49-66) 131/58 (04/03 0550) SpO2:  [95 %-98 %] 95 % (04/03 0550) Weight:  [190 lb 14.7 oz (86.6 kg)] 190 lb 14.7 oz (86.6 kg) (04/03 0550)  Intake/Output from previous day: 04/02 0701 - 04/03 0700 In: 960 [P.O.:960] Out: -   General appearance: alert, cooperative and no distress Heart: regular rate and rhythm Lungs: clear to auscultation bilaterally Abdomen: soft, non-tender; bowel sounds normal; no masses,  no organomegaly Extremities: edema trace Wound: clean and dry  Lab Results: No results for input(s): WBC, HGB, HCT, PLT in the last 72 hours. BMET: No results for input(s): NA, K, CL, CO2, GLUCOSE, BUN, CREATININE, CALCIUM in the last 72 hours.  PT/INR: No results for input(s): LABPROT, INR in the last 72 hours. ABG    Component Value Date/Time   PHART 7.368 06/17/2016 2207   HCO3 22.6 06/17/2016 2207   TCO2 25 06/19/2016 1807   ACIDBASEDEF 3.0 (H) 06/17/2016 2207   O2SAT 12.6 06/22/2016 1345   CBG (last 3)   Recent Labs  06/24/16 1630 06/24/16 2132 06/25/16 0636  GLUCAP 150* 165* 101*    Assessment/Plan: S/P Procedure(s) (LRB): CORONARY ARTERY BYPASS GRAFTING (CABG) x four using left internal mammary artery and right  greater saphenous leg vein using endoscope. (N/A) TRANSESOPHAGEAL ECHOCARDIOGRAM (TEE) (N/A)  1. CV- NSR, remains hypertensive- continue Lopressor, will increase Lopressor to 25 mg bid 2. Pulm- no acute issues, off oxygen, continue IS 3. Renal- creatinine has been stable, repeat BMET in AM, weight is improving, continue Lasix for now, supplement K 4. Neuro- continues to have visual disturbances, unable to read, lacks acuity and unable to pick things up... Due to post operative stroke 5. Dispo- patient stable, awaiting CSW consult for placement to SNF... Patient lives alone and is unsafe to discharge home with visual disturbances... Will need short term SNF placement for post stroke rehabilitation   LOS: 8 days    BARRETT, ERIN 06/25/2016  Diabetes in better control postop Cont lasix 40 p[o daily Ready for SNF bed medically patient examined and medical record reviewed,agree with above note. Tharon Aquas Trigt III 06/25/2016

## 2016-06-25 NOTE — Clinical Social Work Note (Signed)
Clinical Social Work Assessment  Patient Details  Name: Drew Harrison MRN: [id number] Date of Birth: [birth year]  Date of referral:  06/25/16               Reason for consult:  Discharge Planning                Permission sought to share information with:  Family Supports Permission granted to share information::     Name::     Carolan Clines  Agency::     Relationship::  daughter  Contact Information:  [phone]  Housing/Transportation Living arrangements for the past 2 months:  Mobile Home Source of Information:  Patient Patient Interpreter Needed:  None Criminal Activity/Legal Involvement Pertinent to Current Situation/Hospitalization:  No - Comment as needed Significant Relationships:  Adult Children Lives with:  Self Do you feel safe going back to the place where you live?  Yes Need for family participation in patient care:  Yes (Comment)  Care giving concerns: Patient lives alone   Social Worker assessment / plan:  CSW met patient at bedside to offer support and discuss patients needs at discharge. Patient stated he lives alone in a mobile home and he see's his daughter once every couple of weeks. Patient stated he is still having troubles with his vision and would like to discharge to SNF to get assistance with vision. CSW explained to patient that a SNF might not be able to assist him with his vision and it would take time. Patient was addiment about a SNF. CSW faxed patient out and will follow up with patient once bed is available   Employment status:  Other (Comment) (medical leave) Insurance information:  Other (Comment Required) ([employer]) PT Recommendations:  [place] / Referral to community resources:  [place]  Patient/Family's Response to care:  Patient verbalized understanding for CSW role and involvement in care. Patient agreeable to discharge to SNF  Patient/Family's Understanding of and Emotional  Response to Diagnosis, Current Treatment, and Prognosis:  Patient with good understanding of current medical stater and limitations around hospitalization.  Emotional Assessment Appearance:  Appears stated age Attitude/Demeanor/Rapport:  Other Affect (typically observed):  Blunt Orientation:  Oriented to Situation, Oriented to  Time, Oriented to Place, Oriented to Self Alcohol / Substance use:  Tobacco Use (last used 03/29/16) Psych involvement (Current and /or in the community):  No (Comment)  Discharge Needs  Concerns to be addressed:  No discharge needs identified Readmission within the last 30 days:  No Current discharge risk:  None Barriers to Discharge:  No Barriers Identified   Wende Neighbors, LCSW 06/25/2016, 11:48 AM

## 2016-06-26 DIAGNOSIS — I2510 Atherosclerotic heart disease of native coronary artery without angina pectoris: Secondary | ICD-10-CM | POA: Diagnosis not present

## 2016-06-26 DIAGNOSIS — I25110 Atherosclerotic heart disease of native coronary artery with unstable angina pectoris: Secondary | ICD-10-CM | POA: Diagnosis not present

## 2016-06-26 DIAGNOSIS — I639 Cerebral infarction, unspecified: Secondary | ICD-10-CM

## 2016-06-26 DIAGNOSIS — E119 Type 2 diabetes mellitus without complications: Secondary | ICD-10-CM | POA: Diagnosis not present

## 2016-06-26 DIAGNOSIS — H539 Unspecified visual disturbance: Secondary | ICD-10-CM

## 2016-06-26 DIAGNOSIS — Z951 Presence of aortocoronary bypass graft: Secondary | ICD-10-CM | POA: Diagnosis not present

## 2016-06-26 DIAGNOSIS — R278 Other lack of coordination: Secondary | ICD-10-CM | POA: Diagnosis not present

## 2016-06-26 DIAGNOSIS — R2689 Other abnormalities of gait and mobility: Secondary | ICD-10-CM | POA: Diagnosis not present

## 2016-06-26 DIAGNOSIS — Z794 Long term (current) use of insulin: Secondary | ICD-10-CM | POA: Diagnosis not present

## 2016-06-26 DIAGNOSIS — I499 Cardiac arrhythmia, unspecified: Secondary | ICD-10-CM | POA: Diagnosis not present

## 2016-06-26 LAB — GLUCOSE, CAPILLARY
Glucose-Capillary: 86 mg/dL (ref 65–99)
Glucose-Capillary: 117 mg/dL — ABNORMAL HIGH (ref 65–99)

## 2016-06-26 MED ORDER — LISINOPRIL 10 MG PO TABS: 10.0000 mg | ORAL_TABLET | Freq: Every day | ORAL | Status: DC

## 2016-06-26 MED ORDER — METOPROLOL TARTRATE 25 MG PO TABS: 25.0000 mg | ORAL_TABLET | Freq: Two times a day (BID) | ORAL | Status: DC

## 2016-06-26 MED ORDER — GUAIFENESIN ER 600 MG PO TB12: 600.0000 mg | ORAL_TABLET | Freq: Two times a day (BID) | ORAL | Status: DC | PRN

## 2016-06-26 MED ORDER — OXYCODONE HCL 5 MG PO TABS: 5.0000 mg | ORAL_TABLET | ORAL | 0 refills | Status: DC | PRN

## 2016-06-26 MED ORDER — POTASSIUM CHLORIDE CRYS ER 20 MEQ PO TBCR: 20.0000 meq | EXTENDED_RELEASE_TABLET | Freq: Every day | ORAL | 0 refills | Status: DC

## 2016-06-26 MED ORDER — FUROSEMIDE 40 MG PO TABS
40.0000 mg | ORAL_TABLET | Freq: Every day | ORAL | 0 refills | Status: DC
Start: 2016-06-26 — End: 2016-07-24

## 2016-06-26 MED ORDER — INSULIN DETEMIR 100 UNIT/ML ~~LOC~~ SOLN: 18.0000 [IU] | Freq: Two times a day (BID) | SUBCUTANEOUS | 11 refills | Status: DC

## 2016-06-26 MED ORDER — INSULIN ASPART 100 UNIT/ML ~~LOC~~ SOLN: 3.0000 [IU] | Freq: Three times a day (TID) | SUBCUTANEOUS | 11 refills | Status: DC

## 2016-06-26 NOTE — Care Management Note (Signed)
Case Management Note Marvetta Gibbons RN, BSN Unit 2W-Case Manager [phone]  Patient Details  Name: Drew Harrison MRN: [id number] Date of Birth: January 17, 1951  Subjective/Objective:  Pt admitted s/p CABGx3 on 3/26 post op CVA with vision changes                  Action/Plan: PTA pt lived at home alone, pt will need SNF at time of discharge due to vision changes - PT/OT evals recommending SNF- CSW following for placement needs  Expected Discharge Date:  06/26/16               Expected Discharge Plan:  Skilled Nursing Facility  In-House Referral:  Clinical Social Work  Discharge planning Services  CM Consult  Post Acute Care Choice:  NA Choice offered to:  NA  DME Arranged:    DME Agency:     HH Arranged:    [place] Agency:     Status of Service:  Completed, signed off  If discussed at [employer] of Stay Meetings, dates discussed:  4/3  Discharge Disposition: skilled facility   Additional Comments:  06/26/16- 1230- Caylan Schifano RN, CM- pt for d/c to SNF today- CSW following.   Dawayne Patricia, RN 06/26/2016, 12:41 PM

## 2016-06-26 NOTE — Progress Notes (Signed)
Clinical Social Worker facilitated patient discharge including contacting patient family and facility to confirm patient discharge plans.  Clinical information faxed to facility and family agreeable with plan.  CSW arranged ambulance transport via [facility] to [facility].  RN Urban Gibson  to call [phone] (will be going in room 901 B) report prior to discharge.  Clinical Social Worker will sign off for now as social work intervention is no longer needed. Please consult [place] again if new need arises.  Rhea Pink, MSW, [place]

## 2016-06-26 NOTE — Discharge Summary (Signed)
Physician Discharge Summary  Patient ID: Drew Harrison MRN: [id number] DOB/AGE: 66-Dec-1952 21 y.o.  Admit date: 06/17/2016 Discharge date: 06/26/2016  Admission Diagnoses:  Patient Active Problem List   Diagnosis Date Noted  . 3-vessel CAD 06/04/2016  . Abnormal nuclear stress test 04/08/2016  . Cerebrovascular accident (CVA) ([place]) 03/29/2016  . Ischemic stroke ([place]) 03/29/2016  . Essential hypertension   . H/O tobacco use, presenting hazards to health    Discharge Diagnoses:   Patient Active Problem List   Diagnosis Date Noted  . Visual disturbance 06/26/2016  . Stroke due to embolism ([place]) 06/26/2016  . 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 3-vessel CAD 06/04/2016  . Abnormal nuclear stress test 04/08/2016  . Cerebrovascular accident (CVA) ([place]) 03/29/2016  . Ischemic stroke ([place]) 03/29/2016  . Essential hypertension   . H/O tobacco use, presenting hazards to health    Discharged Condition: good  History of Present Illness:  Mr. Drew Harrison is a 66 yo white male with history of nicotine abuse, acute stroke from January of this year, and DM.  The patient originally presented in January with complaints of left sided facial droop and left side parasthesias and aphasia.  CT/MRI were obtained and ruled the patient in for a Lacunar Infarct of the left brainstem.  Echocardiogram was obtained during that hospitalization which showed moderate to severe LV dysfunction which was concerning for underlying coronary disease.  He denied symptoms of angina.  He was evaluated by Dr. Einar Gip who performed a stress test which was markedly positive.  Due to this cardiac catheterization was indicated.  This was performed on 05/14/2016 which showed a reduced EF and significant CAD.  It was felt coronary bypass grafting would be indicated and he was referred to [facility] for surgical evaluation.  He was evaluated by Dr. Prescott Gum on 06/04/2016 at which time patient continued to deny angina symptoms.  He had also quit smoking  due to his recent stroke.  His stroke symptoms had improved.  It was recommended the patient proceed with CABG procedure.  The risks and benefits of the procedure were explained to the patient and he was agreeable to proceed.  However prior to his procedure he was found to have elevated blood sugar >500 in preadmission testing.  He was referred to the ED who referred patient to his PCP office immediately for adjustment of his glucose medications.     Hospital Course:   Mr. Drew Harrison presented to [facility] on 06/17/2016.  He was taken to the operating room and underwent CABG x 4 utilizing LIMA to LAD, SVG to Ramus, SVG to PDA, and SVG to Diagonal.  He also underwent endoscopic harvest of greater saphenous vein from right leg.  He tolerated the procedure without difficulty and was taken to the SICU in stable condition.  The patient was extubated the evening of surgery.  During his stay in the SICU the patient required transfusion of packed cells for expected post operative blood loss anemia. He was weaned off Milrinone and Neo Synephrine as tolerated.  His chest tubes and arterial lines were removed without difficulty.  He was diuresed aggressively with IV Lasix and Zaroxolyn for hypervolemia.  He was felt medically stable for transfer to the stepdown unit on POD #3.  The patient developed visual field disturbances on POD #4.  Due to his recent stroke a CT scan of the head was obtained and Neurology consult was obtained. This revealed a right cerebellar area of hypoattenuation which would correlate  with vision symptoms.  Neurology evaluated the patient and was in agreement he likely suffered an ischemic stroke.  No MRI could be completed due to recent placement of sternal wires.  This can be safely completed in 30 days.  He was restarted on his ASA and Plavix.  He was maintaining NSR and his pacing wires were removed without difficulty.  He has poorly controlled diabetes with a preoperative A1c of >13.  His  sugars have been well controlled with insulin in the hospital.  Will plan to d/c patient on insulin and he will require close follow up with PCP.  He has been evaluated by PT/OT and it is recommended as patient lives alone and is having visual problems he would benefit from SNF placement.  He is ambulating with assistance.  His pain is well controlled.  He is felt medically stable for discharge to SNF today.           Consults: neurology  Significant Diagnostic Studies:   Radiology: CT scan:   No intracranial hemorrhage mass effect or midline shift. There is area of decreased attenuation in right occipital lobe measures about 3 cm please see axial image 14 and sagittal image 23. This is highly suspicious for evolving ischemia/ infarct. Further correlation with brain MRI is recommended as clinically warranted. No definite mass lesion is noted on this unenhanced scan.  Cardiac Catheterization:  LV: Normal LVEF, 50-55%. No wall motion abnormality.  Coronary arteries: Severe triple-vessel coronary artery disease. Moderately calcified coronary vessels.  Right coronary artery: Dominant, proximal 80%, mid 90% followed by occlusion after a small RV branch. Large PDA branch and small PL branch faintly visualized by contralateral collaterals. Left main: Mild coronary calcification. Distal left main shows 10-20% stenosis. 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 There is tandem high-grade 90% stenosis in the proximal LAD and just after the origin of a large D1. Diagonal 1 proximal segment has 80% stenosis. Mid to distal LAD after the aneurysm formation is smooth and very large caliber. Circumflex coronary artery: It is occluded in the proximal segment, gives origin to 2 marginals which are free by collaterals and appeared to be bypassable. Ramus intermediate: There are 2 ramus intermediate branches, first branch has mild  disease in the proximal end, second branch which is inferior branch has a high-grade 80% proximal stenosis in the proximal to mid segment. Mid to distal segment of the vessel is are smooth and fairly large size. Left subclavian and LIMA: Widely patent. Right subclavian and RIMA widely patent.  Recommendation: Patient will need elective CABG. He'll be discharged home today with outpatient follow-up.  Treatments: surgery:   1. Coronary artery bypass grafting x4 (left internal mammary artery to left anterior descending, saphenous vein graft to diagonal, saphenous vein graft to ramus intermediate, saphenous vein graft to posterior descending). 2. Endoscopic harvest of right leg greater saphenous vein.  Disposition: SNF  Discharge Medications:  The patient has been discharged on:   1.Beta Blocker:  Yes [x   ]                              No   [   ]                              If No, reason:  2.Ace  Inhibitor/ARB: Yes [  x ]                                     No  [    ]                                     If No, reason:  3.Statin:   Yes [ x  ]                  No  [   ]                  If No, reason:  4.Ecasa:  Yes  [ x  ]                  No   [   ]                  If No, reason:     Discharge Instructions    Amb Referral to Cardiac Rehabilitation    Complete by:  As directed    Diagnosis:  CABG   CABG X ___:  4     Allergies as of 06/26/2016      Reactions   No Known Allergies       Medication List    STOP taking these medications   naproxen sodium 220 MG tablet Commonly known as:  ANAPROX   valsartan-hydrochlorothiazide 320-12.5 MG tablet Commonly known as:  DIOVAN-HCT     TAKE these medications   allopurinol 100 MG tablet Commonly known as:  ZYLOPRIM Take 1 tablet by mouth 2 (two) times daily.   aspirin 81 MG tablet Take 81 mg by mouth daily.   atorvastatin 40 MG tablet Commonly known as:  LIPITOR Take 1 tablet (40 mg total) by mouth daily at 6  PM.   Carboxymethylcellul-Glycerin 0.5-0.9 % Soln Apply 1 drop to eye 3 (three) times daily as needed.   clopidogrel 75 MG tablet Commonly known as:  PLAVIX Take 75 mg by mouth daily.   famotidine 40 MG tablet Commonly known as:  PEPCID Take 40 mg by mouth 2 (two) times daily.   furosemide 40 MG tablet Commonly known as:  LASIX Take 1 tablet (40 mg total) by mouth daily. For 7 Days   guaiFENesin 600 MG 12 hr tablet Commonly known as:  MUCINEX Take 1 tablet (600 mg total) by mouth 2 (two) times daily as needed.   insulin aspart 100 UNIT/ML injection Commonly known as:  novoLOG Inject 3 Units into the skin 3 (three) times daily with meals.   insulin detemir 100 UNIT/ML injection Commonly known as:  LEVEMIR Inject 0.18 mLs (18 Units total) into the skin 2 (two) times daily.   lisinopril 10 MG tablet Commonly known as:  PRINIVIL,ZESTRIL Take 1 tablet (10 mg total) by mouth daily.   metFORMIN 500 MG tablet Commonly known as:  GLUCOPHAGE Take 1 tablet (500 mg total) by mouth 2 (two) times daily with a meal.   metoprolol tartrate 25 MG tablet Commonly known as:  LOPRESSOR Take 1 tablet (25 mg total) by mouth 2 (two) times daily. What changed:  medication strength  how much to take   oxyCODONE 5 MG immediate release tablet Commonly known as:  Oxy IR/ROXICODONE Take 1-2 tablets (5-10 mg  total) by mouth every 3 (three) hours as needed for severe pain.   potassium chloride SA 20 MEQ tablet Commonly known as:  K-DUR,KLOR-CON Take 1 tablet (20 mEq total) by mouth daily. For 7 Days      Follow-up Information    Adrian Prows, MD Follow up on 07/11/2016.   Specialty:  Cardiology Why:  Appointment is at 2:15 Contact information: [place] [address] [phone]        [place]. Schedule an appointment as soon as possible for a visit.   Specialty:  Family Medicine Why:  will need better glucose management  and follow up to assess how insulin regimen is doing  Contact information: [address] [place] [address] [place] [address] [phone]        Tharon Aquas Trigt III, MD Follow up on 06/26/2016.   Specialty:  Cardiothoracic Surgery Contact information: [address] [place] [address] [phone]          Signed: Ellwood Handler 06/26/2016, 12:26 PM

## 2016-06-26 NOTE — Progress Notes (Signed)
   06/26/16 0950  Clinical Encounter Type  Visited With Patient  Visit Type Other (Comment) ([place] consult)  Spiritual Encounters  Spiritual Needs Emotional  Stress Factors  Patient Stress Factors Health changes  Introduction to Pt. Pt interested in legal POA not medical POA.

## 2016-06-26 NOTE — Progress Notes (Signed)
CARDIAC REHAB PHASE I   PRE:  Rate/Rhythm: 93 SR c/ PVCs  BP:  Sitting: 111/50        SaO2: 97 RA  MODE:  Ambulation: 350 ft   POST:  Rate/Rhythm: 96 SR c/ PVCs  BP:  Sitting: 132/55         SaO2: 97 RA  Pt frustrated, states he would like to know if his vision will improve. Pt needs verbal reminder cues for sternal precautions, relies heavily on his arms. Pt ambulated 350 ft on RA, rolling walker, assist x1, mostly steady gait, tolerated fairly well. Pt c/o DOE, denies any other complaints, declined rest stop. Reviewed discharge education with pt, pt verbalized understanding. Pt to recliner after walk, call bell within reach. Will follow if pt does not discharge today.  [phone] Lenna Sciara, RN, BSN 06/26/2016 8:39 AM

## 2016-06-26 NOTE — Progress Notes (Signed)
Pt discharged to [facility]. Report called.   IV removed. Sutures removed. Telemetry removed, CCMD notified.   Fritz Pickerel, RN

## 2016-06-26 NOTE — Progress Notes (Addendum)
      [address]       [place] [address]             [phone]      9 Days Post-Op Procedure(s) (LRB): CORONARY ARTERY BYPASS GRAFTING (CABG) x four using left internal mammary artery and right greater saphenous leg vein using endoscope. (N/A) TRANSESOPHAGEAL ECHOCARDIOGRAM (TEE) (N/A)   Subjective:  Continues to have no progress with improvement of vision.  Understandably remains frustrated.  He is hoping to be discharged to SNF today.  + ambulation   + BM  Objective: Vital signs in last 24 hours: Temp:  [98.1 F (36.7 C)-98.3 F (36.8 C)] 98.1 F (36.7 C) (04/04 0531) Pulse Rate:  [85-90] 89 (04/04 0531) Cardiac Rhythm: Normal sinus rhythm (04/03 1900) Resp:  [17] 17 (04/04 0531) BP: (112-135)/(45-60) 115/56 (04/04 0531) SpO2:  [94 %-96 %] 94 % (04/04 0531) Weight:  [186 lb 1.1 oz (84.4 kg)] 186 lb 1.1 oz (84.4 kg) (04/04 0531)  Intake/Output from previous day: 04/03 0701 - 04/04 0700 In: 942 [P.O.:942] Out: -   General appearance: alert, cooperative and no distress Heart: regular rate and rhythm Lungs: clear to auscultation bilaterally Abdomen: soft, non-tender; bowel sounds normal; no masses,  no organomegaly Extremities: edema 1+ mild pitting Wound: clean and dry  Lab Results: No results for input(s): WBC, HGB, HCT, PLT in the last 72 hours. BMET: No results for input(s): NA, K, CL, CO2, GLUCOSE, BUN, CREATININE, CALCIUM in the last 72 hours.  PT/INR: No results for input(s): LABPROT, INR in the last 72 hours. ABG    Component Value Date/Time   PHART 7.368 06/17/2016 2207   HCO3 22.6 06/17/2016 2207   TCO2 25 06/19/2016 1807   ACIDBASEDEF 3.0 (H) 06/17/2016 2207   O2SAT 12.6 06/22/2016 1345   CBG (last 3)   Recent Labs  06/25/16 1630 06/25/16 2133 06/26/16 0627  GLUCAP 82 196* 86    Assessment/Plan: S/P Procedure(s) (LRB): CORONARY ARTERY BYPASS GRAFTING (CABG) x four using left internal mammary artery and right greater  saphenous leg vein using endoscope. (N/A) TRANSESOPHAGEAL ECHOCARDIOGRAM (TEE) (N/A)  1. CV- NSR, BP controlled- continue Lopressor, Lisinopril 2. Pulm- no acute issues, continue IS 3. Renal- weight is below baseline, continues to have LE edema... Will add TED hose, continue lasix and taper over next week 4. Neuro- post operative stroke, residual vision problems, hindering ADLs 5. Dispo- patient stable, ready for SNF discharge once bed available   LOS: 9 days    BARRETT, ERIN 06/26/2016 patient examined and medical record reviewed,agree with above note. Tharon Aquas Trigt III 06/26/2016

## 2016-06-27 ENCOUNTER — Non-Acute Institutional Stay (SKILLED_NURSING_FACILITY): Payer: PPO | Admitting: Adult Health

## 2016-06-27 ENCOUNTER — Encounter: Payer: Self-pay | Admitting: Adult Health

## 2016-06-27 DIAGNOSIS — E876 Hypokalemia: Secondary | ICD-10-CM

## 2016-06-27 DIAGNOSIS — E1165 Type 2 diabetes mellitus with hyperglycemia: Secondary | ICD-10-CM | POA: Diagnosis not present

## 2016-06-27 DIAGNOSIS — K219 Gastro-esophageal reflux disease without esophagitis: Secondary | ICD-10-CM

## 2016-06-27 DIAGNOSIS — E8770 Fluid overload, unspecified: Secondary | ICD-10-CM | POA: Diagnosis not present

## 2016-06-27 DIAGNOSIS — E785 Hyperlipidemia, unspecified: Secondary | ICD-10-CM | POA: Diagnosis not present

## 2016-06-27 DIAGNOSIS — I2510 Atherosclerotic heart disease of native coronary artery without angina pectoris: Secondary | ICD-10-CM | POA: Diagnosis not present

## 2016-06-27 DIAGNOSIS — I10 Essential (primary) hypertension: Secondary | ICD-10-CM | POA: Diagnosis not present

## 2016-06-27 DIAGNOSIS — R5381 Other malaise: Secondary | ICD-10-CM

## 2016-06-27 DIAGNOSIS — H04123 Dry eye syndrome of bilateral lacrimal glands: Secondary | ICD-10-CM

## 2016-06-27 DIAGNOSIS — I639 Cerebral infarction, unspecified: Secondary | ICD-10-CM | POA: Diagnosis not present

## 2016-06-27 DIAGNOSIS — D62 Acute posthemorrhagic anemia: Secondary | ICD-10-CM

## 2016-06-27 DIAGNOSIS — Z794 Long term (current) use of insulin: Secondary | ICD-10-CM

## 2016-06-27 DIAGNOSIS — H539 Unspecified visual disturbance: Secondary | ICD-10-CM

## 2016-06-27 DIAGNOSIS — M1A9XX0 Chronic gout, unspecified, without tophus (tophi): Secondary | ICD-10-CM | POA: Diagnosis not present

## 2016-06-27 NOTE — Progress Notes (Signed)
DATE:  06/27/2016   MRN:  [id number]  BIRTHDAY: [birth year]  Facility:  Nursing Home Location:  [facility] Room Number: 409-W  LEVEL OF CARE:  SNF [phone])  Contact Information    Name Relation Home Work Uncertain Daughter [phone]  [phone]   Kimber Relic  [phone]     Attilio, Zeitler Mother [phone]     Laursen,Bric Brother [phone]         Code Status History    Date Active Date Inactive Code Status Order ID Comments User Context   06/17/2016  1:43 PM 06/26/2016  4:49 PM Full Code [id number]  Elgie Collard, PA-C Inpatient   05/14/2016 11:28 AM 05/14/2016  8:02 PM Full Code [id number]  Adrian Prows, MD Inpatient   03/29/2016  4:45 PM 03/30/2016  5:58 PM Full Code [id number]  Dron Tanna Furry, MD ED       Chief Complaint  Patient presents with  . Hospitalization Follow-up    HISTORY OF PRESENT ILLNESS:  This is a 66-YO male seen for hospital follow-up.   He was admitted to [facility] on 06/26/2016 for short-term rehabilitation following an admission at [facility] 06/17/2016-06/26/2016 with CAD for which he had CABG X 4 on 06/17/16 utilizing LIMA to LAD, SVG to Ramus, SVG to PDA and SVG to Diagonal. He also had endoscopic harvest of greater saphenous vein from right leg. He required transfusion of packed cells for expected post op blood loss anemia. He was diuresed aggressively with IV Lasix and Zaroxolyn for hypervolemia. He developed visual field disturbance on POD#4. CT scan of the head showed a right cerebellar area of hypo-attenuation which would correlate with patient's symptoms. Neurology evaluated patient and was in agreement that he likely suffered an ischemic stroke. MRI could not be done due to recent placement of the sternal wires and can be done safely in 30 days. He was restarted on aspirin and Plavix. Preoperative A1c was >13.  CBGs were controlled in the hospital with insulin. Of note, in January she had  left-sided facial droop, left-sided paresthesias and aphasia. CT/MRI showed lacunar infarct of the left brain stem. Echocardiogram showed moderate to severe LV dysfunction which was concerning for underlying coronary disease. He denied symptoms of angina 66. Cardiology performed a stress tests which was markedly positive. Cardiac catheterization was done on 220/2018 which showed a reduced EF and significant CAD. It was felt that coronary bypass grafting was indicated. He recently quit smoking due to recent stroke. He has PMH of nicotine abuse, acute stroke from January 2018 and diabetes mellitus.  He was seen in the room and verbalize concerns of heme not being able to read. He is well aware that dictation problem was due to his recent stroke.    PAST MEDICAL HISTORY:  Past Medical History:  Diagnosis Date  . Abnormal nuclear stress test   . Coronary artery disease    3-vessel  . Diabetes mellitus without complication ([place])   . GERD (gastroesophageal reflux disease)   . Gout   . Headache   . History of tobacco abuse   . Hyperlipidemia   . Hypertension   . Ischemic stroke ([place])   . Stroke ([place]) 03/29/2016   left facial droop  . Stroke due to embolism ([place])   . Visual disturbance      CURRENT MEDICATIONS: Reviewed  Patient's Medications  New Prescriptions   No medications on file  Previous Medications   ALLOPURINOL (ZYLOPRIM) 100  MG TABLET    Take 1 tablet by mouth 2 (two) times daily.    ASPIRIN 81 MG TABLET    Take 81 mg by mouth daily.    ATORVASTATIN (LIPITOR) 40 MG TABLET    Take 1 tablet (40 mg total) by mouth daily at 6 PM.   CARBOXYMETHYLCELLUL-GLYCERIN 0.5-0.9 % SOLN    Apply 1 drop to eye 3 (three) times daily as needed.   CLOPIDOGREL (PLAVIX) 75 MG TABLET    Take 75 mg by mouth daily.   FAMOTIDINE (PEPCID) 40 MG TABLET    Take 40 mg by mouth 2 (two) times daily.   FUROSEMIDE (LASIX) 40 MG TABLET    Take 1 tablet (40 mg total) by mouth daily. For 7 Days   GUAIFENESIN  (MUCINEX) 600 MG 12 HR TABLET    Take 1 tablet (600 mg total) by mouth 2 (two) times daily as needed.   INSULIN ASPART (NOVOLOG) 100 UNIT/ML INJECTION    Inject 3 Units into the skin 3 (three) times daily with meals.   INSULIN DETEMIR (LEVEMIR) 100 UNIT/ML INJECTION    Inject 0.18 mLs (18 Units total) into the skin 2 (two) times daily.   LISINOPRIL (PRINIVIL,ZESTRIL) 10 MG TABLET    Take 1 tablet (10 mg total) by mouth daily.   METFORMIN (GLUCOPHAGE) 500 MG TABLET    Take 1 tablet (500 mg total) by mouth 2 (two) times daily with a meal.   METOPROLOL TARTRATE (LOPRESSOR) 25 MG TABLET    Take 1 tablet (25 mg total) by mouth 2 (two) times daily.   OXYCODONE (OXY IR/ROXICODONE) 5 MG IMMEDIATE RELEASE TABLET    Take 1-2 tablets (5-10 mg total) by mouth every 3 (three) hours as needed for severe pain.   POTASSIUM CHLORIDE SA (K-DUR,KLOR-CON) 20 MEQ TABLET    Take 1 tablet (20 mEq total) by mouth daily. For 7 Days  Modified Medications   No medications on file  Discontinued Medications   No medications on file     Allergies  Allergen Reactions  . No Known Allergies      REVIEW OF SYSTEMS:  GENERAL: no change in appetite, no fatigue, no weight changes, no fever, chills or weakness EYES: complains of not being able to read EARS: Denies change in hearing, ringing in ears, or earache NOSE: Denies nasal congestion or epistaxis MOUTH and THROAT: Denies oral discomfort, gingival pain or bleeding, pain from teeth or hoarseness   RESPIRATORY: no cough, SOB, DOE, wheezing, hemoptysis CARDIAC: no chest pain, edema or palpitations GI: no abdominal pain, diarrhea, constipation, heart burn, nausea or vomiting GU: Denies dysuria, frequency, hematuria, incontinence, or discharge PSYCHIATRIC: Denies feeling of depression or anxiety. No report of hallucinations, insomnia, paranoia, or agitation    PHYSICAL EXAMINATION  GENERAL APPEARANCE: Well nourished. In no acute distress. Normal body habitus SKIN:   Right leg medial surgical incision, right groin puncture site, bilateral upper quadrant and chest midline incisions are dry, no erythema HEAD: Normal in size and contour. No evidence of trauma EYES: Lids open and close normally. No blepharitis, entropion or ectropion. PERRL. Conjunctivae are clear and sclerae are white. Lenses are without opacity EARS: Pinnae are normal. Patient hears normal voice tunes of the examiner MOUTH and THROAT: Lips are without lesions. Oral mucosa is moist and without lesions. Tongue is normal in shape, size, and color and without lesions NECK: supple, trachea midline, no neck masses, no thyroid tenderness, no thyromegaly LYMPHATICS: no LAN in the neck, no supraclavicular LAN  RESPIRATORY: breathing is even & unlabored, BS CTAB CARDIAC: RRR, no murmur,no extra heart sounds, no edema GI: abdomen soft, normal BS, no masses, no tenderness, no hepatomegaly, no splenomegaly EXTREMITIES:  Able to move X 4 extremities PSYCHIATRIC: Alert and oriented X 3. Affect and behavior are appropriate   LABS/RADIOLOGY: Labs reviewed: Basic Metabolic Panel:  Recent Labs  06/17/16 2000  06/18/16 0316 06/18/16 1730  06/20/16 0356 06/21/16 0538 06/22/16 0234  NA  --   < > 137  --   < > 133* 133* 136  K  --   < > 3.8  --   < > 3.2* 4.2 3.8  CL  --   < > 105  --   < > 100* 98* 104  CO2  --   --  23  --   < > 26 24 24   GLUCOSE  --   < > 123*  --   < > 108* 183* 103*  BUN  --   < > 12  --   < > 19 24* 25*  CREATININE 0.91  < > 0.93 0.99  < > 0.95 1.24 1.11  CALCIUM  --   --  7.8*  --   < > 7.5* 7.9* 7.9*  MG 2.5*  --  2.4 2.3  --   --   --   --   < > = values in this interval not displayed. Liver Function Tests:  Recent Labs  06/06/16 0847 06/06/16 1129 06/20/16 0356  AST 15 20 20   ALT 21 23 19   ALKPHOS 124 129* 63  BILITOT 1.0 1.0 1.1  PROT 6.4* 6.4* 4.8*  ALBUMIN 3.8 3.6 2.4*   CBC:  Recent Labs  03/29/16 0859  06/06/16 1129  06/20/16 0356 06/21/16 0538  06/22/16 0234  WBC 9.1  < > 10.3  < > 7.4 7.5 6.5  NEUTROABS 6.6  --  7.8*  --   --   --   --   HGB 16.2  < > 12.4*  < > 8.3* 8.7* 8.9*  HCT 47.4  < > 34.5*  < > 24.4* 26.1* 27.2*  MCV 88.1  < > 82.9  < > 85.9 86.1 86.6  PLT 168  < > 210  < > 112* 137* 155  < > = values in this interval not displayed. Lipid Panel:  Recent Labs  03/30/16 0630 06/22/16 0234  HDL 47 24*   CBG:  Recent Labs  06/25/16 2133 06/26/16 0627 06/26/16 1144  GLUCAP 196* 86 117*      Dg Chest 2 View  Result Date: 06/22/2016 CLINICAL DATA:  Status post CABG. EXAM: CHEST  2 VIEW COMPARISON:  Yesterday. FINDINGS: The right jugular Swan-Ganz catheter sheath has been removed. No pneumothorax. Stable post CABG changes. Normal sized heart. Decreased right basilar atelectasis. Stable left basilar atelectasis. Thoracic spine degenerative changes. Diffuse osteopenia. IMPRESSION: Stable left basilar atelectasis and minimally improved right basilar atelectasis. Electronically Signed   By: Claudie Revering M.D.   On: 06/22/2016 07:52   Dg Chest 2 View  Result Date: 06/21/2016 CLINICAL DATA:  Congestive heart failure. EXAM: CHEST  2 VIEW COMPARISON:  June 20, 2016 FINDINGS: There has been clearing of interstitial edema compared to 1 day prior. Only trace edema remains currently. There are small pleural effusions bilaterally with patchy bibasilar atelectatic change. There is cardiomegaly. The pulmonary vascularity is normal. Central catheter tip is in the superior vena cava. No pneumothorax. There is aortic atherosclerosis. Patient is status  post coronary artery bypass grafting. No bone lesions. Temporary pacemaker wires remain attached to the right heart. IMPRESSION: Near complete clearing of interstitial edema compared to 1 day prior. Small pleural effusions bilaterally are noted with bibasilar atelectasis. Stable cardiac prominence. Aortic atherosclerosis. No pneumothorax. Electronically Signed   By: Lowella Grip III  M.D.   On: 06/21/2016 08:10   Dg Chest 2 View  Result Date: 06/06/2016 CLINICAL DATA:  Coronary artery disease . EXAM: CHEST  2 VIEW COMPARISON:  No recent. FINDINGS: Mediastinum and hilar structures are normal. Lungs are clear. No focal infiltrate. No pleural effusion or pneumothorax. Degenerative changes thoracic spine . IMPRESSION: No acute cardiopulmonary disease. Electronically Signed   By: Marcello Moores  Register   On: 06/06/2016 09:11   Ct Head Wo Contrast  Result Date: 06/21/2016 CLINICAL DATA:  Blurry vision, intermittent headache for the last 2 days, status post CABG 06/17/2016 EXAM: CT HEAD WITHOUT CONTRAST TECHNIQUE: Contiguous axial images were obtained from the base of the skull through the vertex without intravenous contrast. COMPARISON:  None. FINDINGS: Brain: No intracranial hemorrhage mass effect or midline shift. There is area of decreased attenuation in right occipital lobe measures about 3 cm please see axial image 14 and sagittal image 23. This is highly suspicious for evolving ischemia/ infarct. Further correlation with brain MRI is recommended as clinically warranted. No definite mass lesion is noted on this unenhanced scan. Vascular: Atherosclerotic calcifications of carotid siphon are noted. Skull: No skull fracture. Sinuses/Orbits: No acute findings Other: None IMPRESSION: No intracranial hemorrhage mass effect or midline shift. There is area of decreased attenuation in right occipital lobe measures about 3 cm please see axial image 14 and sagittal image 23. This is highly suspicious for evolving ischemia/ infarct. Further correlation with brain MRI is recommended as clinically warranted. No definite mass lesion is noted on this unenhanced scan. This was made a call report. Electronically Signed   By: Lahoma Crocker M.D.   On: 06/21/2016 10:36   Ct Chest W Contrast  Result Date: 06/07/2016 CLINICAL DATA:  Coronary artery disease. Calcification of the aorta. EXAM: CT CHEST WITH CONTRAST  TECHNIQUE: Multidetector CT imaging of the chest was performed during intravenous contrast administration. CONTRAST:  59mL ISOVUE-300 IOPAMIDOL (ISOVUE-300) INJECTION 61% COMPARISON:  Chest x-ray dated 06/06/2016 FINDINGS: Cardiovascular: Aortic atherosclerosis. No aneurysmal dilatation or aortic dissection. Extensive coronary artery calcifications. Overall heart size is normal. No pericardial effusion. Mediastinum/Nodes: No enlarged mediastinal, hilar, or axillary lymph nodes. Thyroid gland, trachea, and esophagus demonstrate no significant findings. Lungs/Pleura: There are a few tiny calcified granulomas bilaterally. The lungs are otherwise clear. No effusions. Upper Abdomen: Normal. Musculoskeletal: No chest wall abnormality. No acute or significant osseous findings. IMPRESSION: Extensive aortic atherosclerosis. Extensive coronary artery calcification. No aneurysm formation or aortic dissection. No acute abnormalities. Electronically Signed   By: Lorriane Shire M.D.   On: 06/07/2016 13:42   Dg Chest Port 1 View  Result Date: 06/20/2016 CLINICAL DATA:  Shortness of breath, status post CABG 3 days ago. EXAM: PORTABLE CHEST 1 VIEW COMPARISON:  Portable chest x-ray of June 19, 2016 FINDINGS: There is been interval removal of the left chest tube and of the mediastinal drain. There is no pneumothorax. A small amount of pleural fluid is likely present at both lung bases but is stable. The cardiac silhouette is enlarged. The pulmonary vascularity is engorged but slightly more distinct today. The right internal jugular Cordis sheath tip terminates over the proximal SVC. IMPRESSION: Mild improvement in pulmonary interstitial edema  since yesterday's study. Tiny bilateral pleural effusions. Electronically Signed   By: David  Martinique M.D.   On: 06/20/2016 07:53   Dg Chest Port 1 View  Result Date: 06/19/2016 CLINICAL DATA:  Follow-up chest tube EXAM: PORTABLE CHEST 1 VIEW COMPARISON:  06/18/2016 FINDINGS: Swan-Ganz  catheter is been removed. Right internal jugular venous access sheath remains in place. Left chest tube remains in place. No pneumothorax. Slight worsening of diffuse edema pattern. Persistent lower lobe volume loss left more than right. IMPRESSION: No pneumothorax. Slight worsening of diffuse edema pattern. Persistent lower lobe volume loss left more than right. Electronically Signed   By: Nelson Chimes M.D.   On: 06/19/2016 07:44   Dg Chest Port 1 View  Result Date: 06/18/2016 CLINICAL DATA:  Status post coronary bypass grafting EXAM: PORTABLE CHEST 1 VIEW COMPARISON:  06/17/2016 FINDINGS: Cardiac shadow is mildly enlarged but stable. Postoperative changes are again seen. Left thoracostomy catheter and mediastinal drains walls Swan-Ganz catheter are again noted. The endotracheal tube and nasogastric catheter have been removed in the interval. Mild bibasilar atelectasis is seen. No pneumothorax is identified. No bony abnormality is seen. IMPRESSION: Tubes and lines as described above. Mild bibasilar atelectasis new from the prior exam. Electronically Signed   By: Inez Catalina M.D.   On: 06/18/2016 07:22   Dg Chest Port 1 View  Result Date: 06/17/2016 CLINICAL DATA:  66 year old male status post CABG. EXAM: PORTABLE CHEST 1 VIEW COMPARISON:  Chest x-ray 06/06/2016. FINDINGS: Patient is intubated, with the tip of the endotracheal tube approximately 4.3 cm above the carina. A nasogastric tube is seen extending into the stomach, however, the tip of the nasogastric tube extends below the lower margin of the image. Left-sided chest tube with tip in the mid left hemithorax. Right IJ central venous Cordis through which a Swan-Ganz catheter has been passed into the distal right main pulmonary artery. Lung volumes are low. Linear opacity in the left base compatible with subsegmental atelectasis. No appreciable pneumothorax. No acute consolidative airspace disease. No pleural effusions. No evidence of pulmonary edema.  Heart size is upper limits of normal. The patient is rotated to the left on today's exam, resulting in distortion of the mediastinal contours and reduced diagnostic sensitivity and specificity for mediastinal pathology. Aortic atherosclerosis. New median sternotomy wires. IMPRESSION: 1. Postoperative changes and support apparatus, as above. 2. Low lung volumes with subsegmental atelectasis in the periphery of the left lung base. Electronically Signed   By: Vinnie Langton M.D.   On: 06/17/2016 14:13    ASSESSMENT/PLAN:  Physical   deconditioning - for rehabilitation, PT and OT, for therapeutic strengthening exercises; fall precautions  CVA - continue Plavix 75 mg 1 tab by mouth daily, Lipitor 40 mg 1 tab by mouth daily, aspirin 81 mg 1 tab by mouth daily, lisinopril 10 mg 1 tab by mouth daily, Lipitor 40 mg 1 tab by mouth daily  CAD - S/P CABG 4, follow-up with cardiothoracic surgery, Dr. Tharon Aquas Trigt, continue oxycodone 5 mg 1-2 tabs by mouth every 3 hours when necessary, aspirin 81 mg 1 tab by mouth daily and Plavix 75 mg 1 tab by mouth daily, follow-up with cardiology, Dr. Filbert Schilder, on 07/11/16 at 2:15 PM  Diabetes mellitus, uncontrolled - continue Levemir 100 units/mL injected 18 units subcutaneous twice a day, NovoLog 100 units/mL inject 3 units subcutaneous 3 times a day with meals and metformin 500 mg 1 tab by mouth twice a day Lab Results  Component Value Date   HGBA1C  12.2 (H) 06/22/2016   Visual disturbances - will refer to Dr. Lucita Ferrara, ophthalmology; fall precautions  Hypokalemia - continue a CL ER 20 meq 1 tab by mouth daily for a total of 7 days Lab Results  Component Value Date   K 3.8 06/22/2016   Hypervolemia - continue Lasix 40 mg 1 tab by mouth daily for a total of 7 days; check BMP  Essential hypertension - continue lisinopril 10 mg 1 tab by mouth daily and metoprolol tartrate 25 mg 1 tab by mouth twice a day  Hyperlipidemia - continue Lipitor 40 mg 1 tab by mouth  daily Lab Results  Component Value Date   CHOL 97 06/22/2016   HDL 24 (L) 06/22/2016   LDLCALC 45 06/22/2016   TRIG 142 06/22/2016   CHOLHDL 4.0 06/22/2016   Gout - continue allopurinol 100 mg 1 tab by mouth twice a day  GERD - continue Pepcid 40 mg 1 tab by mouth twice a day  Dry eyes - continue Refresh Tears 0.5-0.9% 1 drop into both eyes 3 times a day when necessary  Acute blood loss anemia - check CBC Lab Results  Component Value Date   HGB 8.9 (L) 06/22/2016      Goals of care:  Short-term rehabilitation   Monina C. Annawan - NP    [employer] [phone]

## 2016-06-28 ENCOUNTER — Non-Acute Institutional Stay (SKILLED_NURSING_FACILITY): Payer: PPO | Admitting: Internal Medicine

## 2016-06-28 ENCOUNTER — Encounter: Payer: Self-pay | Admitting: Internal Medicine

## 2016-06-28 DIAGNOSIS — H53462 Homonymous bilateral field defects, left side: Secondary | ICD-10-CM | POA: Diagnosis not present

## 2016-06-28 DIAGNOSIS — E1159 Type 2 diabetes mellitus with other circulatory complications: Secondary | ICD-10-CM

## 2016-06-28 DIAGNOSIS — I639 Cerebral infarction, unspecified: Secondary | ICD-10-CM | POA: Diagnosis not present

## 2016-06-28 DIAGNOSIS — I2510 Atherosclerotic heart disease of native coronary artery without angina pectoris: Secondary | ICD-10-CM | POA: Diagnosis not present

## 2016-06-28 DIAGNOSIS — R600 Localized edema: Secondary | ICD-10-CM | POA: Diagnosis not present

## 2016-06-28 DIAGNOSIS — D500 Iron deficiency anemia secondary to blood loss (chronic): Secondary | ICD-10-CM | POA: Diagnosis not present

## 2016-06-28 DIAGNOSIS — K219 Gastro-esophageal reflux disease without esophagitis: Secondary | ICD-10-CM | POA: Diagnosis not present

## 2016-06-28 DIAGNOSIS — I10 Essential (primary) hypertension: Secondary | ICD-10-CM

## 2016-06-28 DIAGNOSIS — R5381 Other malaise: Secondary | ICD-10-CM

## 2016-06-28 DIAGNOSIS — E1165 Type 2 diabetes mellitus with hyperglycemia: Secondary | ICD-10-CM

## 2016-06-28 NOTE — Progress Notes (Signed)
LOCATION: [place]  PCP: [place]   Code Status: Full Code  Goals of care: Advanced Directive information Advanced Directives 06/24/2016  Does Patient Have a Medical Advance Directive? No  Would patient like information on creating a medical advance directive? No - Patient declined       Extended Emergency Contact Information Primary Emergency Contact: [place] Phone: [phone] Mobile Phone: [phone] Relation: Daughter Secondary Emergency Contact: Monica [place] of [place] Phone: [phone] Relation: None Mother: [place], L[place] of [place] Phone: [phone]   Allergies  Allergen Reactions  . No Known Allergies     Chief Complaint  Patient presents with  . New Admit To SNF    New Admission Visit      HPI:  Patient is a 66 y.o. male seen today for short term rehabilitation post hospital admission from 06/17/16-06/26/16 for CABG X4 using Left internal mammary artery and right greater saphenous leg vein for significant coronary artery disease. He required blood transfusion post op due to blood loss anemia. He also required IV diuresis for hypervolemia. Post surgery, he did left visual field disturbance and neurology consult was obtained given his recent history of stroke. CT scan of head showed right posterior cerebral artery infarct. He was restarted on aspirin and Plavix. He has medical history of cerebrovascular accident, coronary artery disease, hypertension, uncontrolled diabetes mellitus, tobacco use among others. He is seen in his room today.  Review of Systems:  Constitutional: Negative for fever, chills, diaphoresis. Energy level is slowly coming back. HENT: Negative for headache, congestion, nasal discharge, difficulty swallowing.   Eyes: Positive for decreased peripheral vision to left eye. Denies any double vision or eye pain at present.    Respiratory: Negative for cough and wheezing. Positive for shortness of breath with exertion. Cardiovascular: Negative for chest pain, palpitations, leg swelling.  Gastrointestinal: Negative for heartburn, nausea, vomiting, abdominal pain, loss of appetite. Last bowel movement was yesterday. Genitourinary: Negative for dysuria and flank pain.  Musculoskeletal: Negative for back pain, fall in the facility.  Skin: Negative for itching, rash.  Neurological: Negative for dizziness. Psychiatric/Behavioral: Negative for depression.   Past Medical History:  Diagnosis Date  . Abnormal nuclear stress test   . Coronary artery disease    3-vessel  . Diabetes mellitus without complication ([place])   . GERD (gastroesophageal reflux disease)   . Gout   . Headache   . History of tobacco abuse   . Hyperlipidemia   . Hypertension   . Ischemic stroke ([place])   . Stroke ([place]) 03/29/2016   left facial droop  . Stroke due to embolism ([place])   . Visual disturbance    Past Surgical History:  Procedure Laterality Date  . CORONARY ARTERY BYPASS GRAFT N/A 06/17/2016   Procedure: CORONARY ARTERY BYPASS GRAFTING (CABG) x four using left internal mammary artery and right greater saphenous leg vein using endoscope.;  Surgeon: [name], MD;  Location: [facility];  Service: Open Heart Surgery;  Laterality: N/A;  . LEFT HEART CATH AND CORONARY ANGIOGRAPHY N/A 05/14/2016   Procedure: Left Heart Cath and Coronary Angiography;  Surgeon: Adrian Prows, MD;  Location: [facility];  Service: Cardiovascular;  Laterality: N/A;  . TEE WITHOUT CARDIOVERSION N/A 06/17/2016   Procedure: TRANSESOPHAGEAL ECHOCARDIOGRAM (TEE);  Surgeon: [name], MD;  Location: [facility];  Service: Open Heart Surgery;  Laterality: N/A;  . TONSILLECTOMY  Social History:   reports that he quit smoking about 7 weeks ago. His smoking use included Cigarettes. He has a 72.00 pack-year smoking history. He has never used smokeless tobacco. He  reports that he drinks alcohol. He reports that he uses drugs, including Cocaine.  No family history on file.  Medications: Allergies as of 06/28/2016      Reactions   No Known Allergies       Medication List       Accurate as of 06/28/16 10:42 AM. Always use your most recent med list.          allopurinol 100 MG tablet Commonly known as:  ZYLOPRIM Take 1 tablet by mouth 2 (two) times daily.   aspirin 81 MG tablet Take 81 mg by mouth daily.   atorvastatin 40 MG tablet Commonly known as:  LIPITOR Take 1 tablet (40 mg total) by mouth daily at 6 PM.   Carboxymethylcellul-Glycerin 0.5-0.9 % Soln Apply 1 drop to eye 3 (three) times daily as needed.   clopidogrel 75 MG tablet Commonly known as:  PLAVIX Take 75 mg by mouth daily.   famotidine 40 MG tablet Commonly known as:  PEPCID Take 40 mg by mouth 2 (two) times daily.   furosemide 40 MG tablet Commonly known as:  LASIX Take 1 tablet (40 mg total) by mouth daily. For 7 Days   guaiFENesin 600 MG 12 hr tablet Commonly known as:  MUCINEX Take 1 tablet (600 mg total) by mouth 2 (two) times daily as needed.   insulin aspart 100 UNIT/ML injection Commonly known as:  novoLOG Inject 3 Units into the skin 3 (three) times daily with meals.   insulin detemir 100 UNIT/ML injection Commonly known as:  LEVEMIR Inject 0.18 mLs (18 Units total) into the skin 2 (two) times daily.   lisinopril 10 MG tablet Commonly known as:  PRINIVIL,ZESTRIL Take 1 tablet (10 mg total) by mouth daily.   metFORMIN 500 MG tablet Commonly known as:  GLUCOPHAGE Take 1 tablet (500 mg total) by mouth 2 (two) times daily with a meal.   metoprolol tartrate 25 MG tablet Commonly known as:  LOPRESSOR Take 1 tablet (25 mg total) by mouth 2 (two) times daily.   oxyCODONE 5 MG immediate release tablet Commonly known as:  Oxy IR/ROXICODONE Take 1-2 tablets (5-10 mg total) by mouth every 3 (three) hours as needed for severe pain.   potassium chloride  SA 20 MEQ tablet Commonly known as:  K-DUR,KLOR-CON Take 1 tablet (20 mEq total) by mouth daily. For 7 Days       Immunizations: Immunization History  Administered Date(s) Administered  . Influenza, High Dose Seasonal PF 01/02/2016  . Influenza-Unspecified 12/24/2014  . PPD Test 06/26/2016  . Tdap 11/23/2008     Physical Exam: Vitals:   06/28/16 1034  BP: 108/63  Pulse: 93  Resp: 18  Temp: 98 F (36.7 C)  TempSrc: Oral  SpO2: 93%  Weight: 186 lb (84.4 kg)  Height: 6' (1.829 m)   Body mass index is 25.23 kg/m.  General- elderly male, well built, in no acute distress Head- normocephalic, atraumatic Nose- no maxillary or frontal sinus tenderness, no nasal discharge Throat- moist mucus membrane Eyes- PERRLA, EOMI, no pallor, no icterus, no discharge, normal conjunctiva, normal sclera Neck- no cervical lymphadenopathy Cardiovascular- normal s1,s2, no murmur Respiratory- bilateral clear to auscultation, no wheeze, no rhonchi, no crackles, no use of accessory muscles Abdomen- bowel sounds present, soft, non tender, no guarding or rigidity Musculoskeletal-  able to move all 4 extremities, generalized weakness, trace leg edema Neurological- alert and oriented to person, place and time Skin- warm and dry, surgical incision to external area healing well with no signs of infection, two surgical incision on upper abdominal wall with Steri-Strips in place, graft site on right lower leg is healing well with no signs of infection Psychiatry- normal mood and affect    Labs reviewed: Basic Metabolic Panel:  Recent Labs  06/17/16 2000  06/18/16 0316 06/18/16 1730  06/20/16 0356 06/21/16 0538 06/22/16 0234  NA  --   < > 137  --   < > 133* 133* 136  K  --   < > 3.8  --   < > 3.2* 4.2 3.8  CL  --   < > 105  --   < > 100* 98* 104  CO2  --   --  23  --   < > 26 24 24   GLUCOSE  --   < > 123*  --   < > 108* 183* 103*  BUN  --   < > 12  --   < > 19 24* 25*  CREATININE 0.91  < >  0.93 0.99  < > 0.95 1.24 1.11  CALCIUM  --   --  7.8*  --   < > 7.5* 7.9* 7.9*  MG 2.5*  --  2.4 2.3  --   --   --   --   < > = values in this interval not displayed. Liver Function Tests:  Recent Labs  06/06/16 0847 06/06/16 1129 06/20/16 0356  AST 15 20 20   ALT 21 23 19   ALKPHOS 124 129* 63  BILITOT 1.0 1.0 1.1  PROT 6.4* 6.4* 4.8*  ALBUMIN 3.8 3.6 2.4*   No results for input(s): LIPASE, AMYLASE in the last 8760 hours. No results for input(s): AMMONIA in the last 8760 hours. CBC:  Recent Labs  03/29/16 0859  06/06/16 1129  06/20/16 0356 06/21/16 0538 06/22/16 0234  WBC 9.1  < > 10.3  < > 7.4 7.5 6.5  NEUTROABS 6.6  --  7.8*  --   --   --   --   HGB 16.2  < > 12.4*  < > 8.3* 8.7* 8.9*  HCT 47.4  < > 34.5*  < > 24.4* 26.1* 27.2*  MCV 88.1  < > 82.9  < > 85.9 86.1 86.6  PLT 168  < > 210  < > 112* 137* 155  < > = values in this interval not displayed. Cardiac Enzymes: No results for input(s): CKTOTAL, CKMB, CKMBINDEX, TROPONINI in the last 8760 hours. BNP: Invalid input(s): POCBNP CBG:  Recent Labs  06/25/16 2133 06/26/16 0627 06/26/16 1144  GLUCAP 196* 86 117*    Radiological Exams: Dg Chest 2 View  Result Date: 06/22/2016 CLINICAL DATA:  Status post CABG. EXAM: CHEST  2 VIEW COMPARISON:  Yesterday. FINDINGS: The right jugular Swan-Ganz catheter sheath has been removed. No pneumothorax. Stable post CABG changes. Normal sized heart. Decreased right basilar atelectasis. Stable left basilar atelectasis. Thoracic spine degenerative changes. Diffuse osteopenia. IMPRESSION: Stable left basilar atelectasis and minimally improved right basilar atelectasis. Electronically Signed   By: Claudie Revering M.D.   On: 06/22/2016 07:52   Dg Chest 2 View  Result Date: 06/21/2016 CLINICAL DATA:  Congestive heart failure. EXAM: CHEST  2 VIEW COMPARISON:  June 20, 2016 FINDINGS: There has been clearing of interstitial edema compared to 1 day prior. Only  trace edema remains currently.  There are small pleural effusions bilaterally with patchy bibasilar atelectatic change. There is cardiomegaly. The pulmonary vascularity is normal. Central catheter tip is in the superior vena cava. No pneumothorax. There is aortic atherosclerosis. Patient is status post coronary artery bypass grafting. No bone lesions. Temporary pacemaker wires remain attached to the right heart. IMPRESSION: Near complete clearing of interstitial edema compared to 1 day prior. Small pleural effusions bilaterally are noted with bibasilar atelectasis. Stable cardiac prominence. Aortic atherosclerosis. No pneumothorax. Electronically Signed   By: Lowella Grip III M.D.   On: 06/21/2016 08:10   Dg Chest 2 View  Result Date: 06/06/2016 CLINICAL DATA:  Coronary artery disease . EXAM: CHEST  2 VIEW COMPARISON:  No recent. FINDINGS: Mediastinum and hilar structures are normal. Lungs are clear. No focal infiltrate. No pleural effusion or pneumothorax. Degenerative changes thoracic spine . IMPRESSION: No acute cardiopulmonary disease. Electronically Signed   By: Marcello Moores  Register   On: 06/06/2016 09:11   Ct Head Wo Contrast  Result Date: 06/21/2016 CLINICAL DATA:  Blurry vision, intermittent headache for the last 2 days, status post CABG 06/17/2016 EXAM: CT HEAD WITHOUT CONTRAST TECHNIQUE: Contiguous axial images were obtained from the base of the skull through the vertex without intravenous contrast. COMPARISON:  None. FINDINGS: Brain: No intracranial hemorrhage mass effect or midline shift. There is area of decreased attenuation in right occipital lobe measures about 3 cm please see axial image 14 and sagittal image 23. This is highly suspicious for evolving ischemia/ infarct. Further correlation with brain MRI is recommended as clinically warranted. No definite mass lesion is noted on this unenhanced scan. Vascular: Atherosclerotic calcifications of carotid siphon are noted. Skull: No skull fracture. Sinuses/Orbits: No acute  findings Other: None IMPRESSION: No intracranial hemorrhage mass effect or midline shift. There is area of decreased attenuation in right occipital lobe measures about 3 cm please see axial image 14 and sagittal image 23. This is highly suspicious for evolving ischemia/ infarct. Further correlation with brain MRI is recommended as clinically warranted. No definite mass lesion is noted on this unenhanced scan. This was made a call report. Electronically Signed   By: Lahoma Crocker M.D.   On: 06/21/2016 10:36   Ct Chest W Contrast  Result Date: 06/07/2016 CLINICAL DATA:  Coronary artery disease. Calcification of the aorta. EXAM: CT CHEST WITH CONTRAST TECHNIQUE: Multidetector CT imaging of the chest was performed during intravenous contrast administration. CONTRAST:  36mL ISOVUE-300 IOPAMIDOL (ISOVUE-300) INJECTION 61% COMPARISON:  Chest x-ray dated 06/06/2016 FINDINGS: Cardiovascular: Aortic atherosclerosis. No aneurysmal dilatation or aortic dissection. Extensive coronary artery calcifications. Overall heart size is normal. No pericardial effusion. Mediastinum/Nodes: No enlarged mediastinal, hilar, or axillary lymph nodes. Thyroid gland, trachea, and esophagus demonstrate no significant findings. Lungs/Pleura: There are a few tiny calcified granulomas bilaterally. The lungs are otherwise clear. No effusions. Upper Abdomen: Normal. Musculoskeletal: No chest wall abnormality. No acute or significant osseous findings. IMPRESSION: Extensive aortic atherosclerosis. Extensive coronary artery calcification. No aneurysm formation or aortic dissection. No acute abnormalities. Electronically Signed   By: Lorriane Shire M.D.   On: 06/07/2016 13:42   Dg Chest Port 1 View  Result Date: 06/20/2016 CLINICAL DATA:  Shortness of breath, status post CABG 3 days ago. EXAM: PORTABLE CHEST 1 VIEW COMPARISON:  Portable chest x-ray of June 19, 2016 FINDINGS: There is been interval removal of the left chest tube and of the mediastinal  drain. There is no pneumothorax. A small amount of pleural fluid is likely  present at both lung bases but is stable. The cardiac silhouette is enlarged. The pulmonary vascularity is engorged but slightly more distinct today. The right internal jugular Cordis sheath tip terminates over the proximal SVC. IMPRESSION: Mild improvement in pulmonary interstitial edema since yesterday's study. Tiny bilateral pleural effusions. Electronically Signed   By: David  Martinique M.D.   On: 06/20/2016 07:53   Dg Chest Port 1 View  Result Date: 06/19/2016 CLINICAL DATA:  Follow-up chest tube EXAM: PORTABLE CHEST 1 VIEW COMPARISON:  06/18/2016 FINDINGS: Swan-Ganz catheter is been removed. Right internal jugular venous access sheath remains in place. Left chest tube remains in place. No pneumothorax. Slight worsening of diffuse edema pattern. Persistent lower lobe volume loss left more than right. IMPRESSION: No pneumothorax. Slight worsening of diffuse edema pattern. Persistent lower lobe volume loss left more than right. Electronically Signed   By: Nelson Chimes M.D.   On: 06/19/2016 07:44   Dg Chest Port 1 View  Result Date: 06/18/2016 CLINICAL DATA:  Status post coronary bypass grafting EXAM: PORTABLE CHEST 1 VIEW COMPARISON:  06/17/2016 FINDINGS: Cardiac shadow is mildly enlarged but stable. Postoperative changes are again seen. Left thoracostomy catheter and mediastinal drains walls Swan-Ganz catheter are again noted. The endotracheal tube and nasogastric catheter have been removed in the interval. Mild bibasilar atelectasis is seen. No pneumothorax is identified. No bony abnormality is seen. IMPRESSION: Tubes and lines as described above. Mild bibasilar atelectasis new from the prior exam. Electronically Signed   By: Inez Catalina M.D.   On: 06/18/2016 07:22   Dg Chest Port 1 View  Result Date: 06/17/2016 CLINICAL DATA:  66 year old male status post CABG. EXAM: PORTABLE CHEST 1 VIEW COMPARISON:  Chest x-ray 06/06/2016.  FINDINGS: Patient is intubated, with the tip of the endotracheal tube approximately 4.3 cm above the carina. A nasogastric tube is seen extending into the stomach, however, the tip of the nasogastric tube extends below the lower margin of the image. Left-sided chest tube with tip in the mid left hemithorax. Right IJ central venous Cordis through which a Swan-Ganz catheter has been passed into the distal right main pulmonary artery. Lung volumes are low. Linear opacity in the left base compatible with subsegmental atelectasis. No appreciable pneumothorax. No acute consolidative airspace disease. No pleural effusions. No evidence of pulmonary edema. Heart size is upper limits of normal. The patient is rotated to the left on today's exam, resulting in distortion of the mediastinal contours and reduced diagnostic sensitivity and specificity for mediastinal pathology. Aortic atherosclerosis. New median sternotomy wires. IMPRESSION: 1. Postoperative changes and support apparatus, as above. 2. Low lung volumes with subsegmental atelectasis in the periphery of the left lung base. Electronically Signed   By: Vinnie Langton M.D.   On: 06/17/2016 14:13    Assessment/Plan  Physical deconditioning With generalized weakness. Here for rehabilitation. Will have him work with physical therapy and occupational therapy team to help with gait training and muscle strengthening exercises.fall precautions. Skin care. Encourage to be out of bed.   Coronary artery disease Status post CABG 4. Will need follow-up with cardiology and cardiothoracic surgery. Provide wound care. Continue aspirin, Plavix, lisinopril, metoprolol tartrate and atorvastatin. Monitor vital signs every shift for now. Remains chest pain-free at present.  Ischemic stroke Has ongoing visual deficit. Will need to be seen by ophthalmology. Continue Plavix 75 mg daily, atorvastatin 40 mg daily and aspirin 81 mg daily. Fall precautions to be taken. Will have  patient work with PT/OT as tolerated to regain strength  and restore function.  Fall precautions are in place. Neurology f/u in 6 weeks.   Left homonymous hemianopsia Post cva. f/u with ophthalmology. No driving for now until peripheral vision improves and is cleared by ophthalmology.   Blood loss anemia Postoperative, status post blood transfusion in the hospital, check CBC  Uncontrolled type 2 diabetes mellitus Currently on Levemir 18 units twice a day with NovoLog 3 units with meals and metformin 500 mg twice a day. To provide novolog only if cbg > 150 with meals to avoid hypoglycemia. Blood sugar reading on a few is 129, 140, 147. Continue current regimen and monitor blood sugar reading. Lab Results  Component Value Date   HGBA1C 12.2 (H) 06/22/2016   HTN Soft BP on review. On multiple medication that would affect BP. Add holding parameter to lasix and lisinopril to avoid hypotensive episodes.   Leg edema Currently on Lasix 40 mg daily with potassium supplement, check bmp and weight 3 days a week to assess for volume overload.   GERD Denies any symptom at present. Currently on Pepcid 40 mg twice a day. Monitor clinically.   Goals of care: short term rehabilitation   Labs/tests ordered:  Family/ staff Communication: reviewed care plan with patient and nursing supervisor    Blanchie Serve, MD Internal Medicine [place], [place] [address] Cell Phone (Monday-Friday 8 am - 5 pm): [phone] On Call: [phone] and follow prompts after 5 pm and on weekends Office Phone: [phone] Office Fax: [phone]

## 2016-07-01 DIAGNOSIS — H26493 Other secondary cataract, bilateral: Secondary | ICD-10-CM | POA: Diagnosis not present

## 2016-07-01 DIAGNOSIS — E119 Type 2 diabetes mellitus without complications: Secondary | ICD-10-CM | POA: Diagnosis not present

## 2016-07-01 DIAGNOSIS — Z961 Presence of intraocular lens: Secondary | ICD-10-CM | POA: Diagnosis not present

## 2016-07-01 DIAGNOSIS — H53461 Homonymous bilateral field defects, right side: Secondary | ICD-10-CM | POA: Diagnosis not present

## 2016-07-01 DIAGNOSIS — H26491 Other secondary cataract, right eye: Secondary | ICD-10-CM | POA: Diagnosis not present

## 2016-07-01 LAB — BASIC METABOLIC PANEL
BUN: 14 mg/dL (ref 4–21)
Creatinine: 0.9 mg/dL (ref 0.6–1.3)
Glucose: 84 mg/dL
Potassium: 4.2 mmol/L (ref 3.4–5.3)
Sodium: 141 mmol/L (ref 137–147)

## 2016-07-01 LAB — CBC AND DIFFERENTIAL
HCT: 25 % — AB (ref 41–53)
Hemoglobin: 8 g/dL — AB (ref 13.5–17.5)
Platelets: 280 10*3/uL (ref 150–399)
WBC: 6.9 10^3/mL

## 2016-07-02 ENCOUNTER — Non-Acute Institutional Stay (SKILLED_NURSING_FACILITY): Payer: PPO | Admitting: Adult Health

## 2016-07-02 ENCOUNTER — Encounter: Payer: Self-pay | Admitting: Adult Health

## 2016-07-02 DIAGNOSIS — I639 Cerebral infarction, unspecified: Secondary | ICD-10-CM

## 2016-07-02 DIAGNOSIS — E785 Hyperlipidemia, unspecified: Secondary | ICD-10-CM | POA: Diagnosis not present

## 2016-07-02 DIAGNOSIS — I2510 Atherosclerotic heart disease of native coronary artery without angina pectoris: Secondary | ICD-10-CM

## 2016-07-02 DIAGNOSIS — R5381 Other malaise: Secondary | ICD-10-CM | POA: Diagnosis not present

## 2016-07-02 DIAGNOSIS — H53462 Homonymous bilateral field defects, left side: Secondary | ICD-10-CM | POA: Diagnosis not present

## 2016-07-02 DIAGNOSIS — M1A9XX0 Chronic gout, unspecified, without tophus (tophi): Secondary | ICD-10-CM

## 2016-07-02 DIAGNOSIS — H04123 Dry eye syndrome of bilateral lacrimal glands: Secondary | ICD-10-CM | POA: Diagnosis not present

## 2016-07-02 DIAGNOSIS — I10 Essential (primary) hypertension: Secondary | ICD-10-CM | POA: Diagnosis not present

## 2016-07-02 DIAGNOSIS — Z794 Long term (current) use of insulin: Secondary | ICD-10-CM

## 2016-07-02 DIAGNOSIS — E1165 Type 2 diabetes mellitus with hyperglycemia: Secondary | ICD-10-CM | POA: Diagnosis not present

## 2016-07-02 DIAGNOSIS — K219 Gastro-esophageal reflux disease without esophagitis: Secondary | ICD-10-CM

## 2016-07-02 DIAGNOSIS — D500 Iron deficiency anemia secondary to blood loss (chronic): Secondary | ICD-10-CM

## 2016-07-02 NOTE — Progress Notes (Signed)
DATE:  07/02/2016  MRN:  [id number]  BIRTHDAY: 1950-09-27  Facility:  Nursing Home Location:  [place] Room Number: 654-Y  LEVEL OF CARE:  SNF [phone])  Contact Information    Name Relation Home Work Beverly Daughter [phone]  [phone]   Kimber Relic  [phone]     Tige, Meas Mother [phone]     Kitamura,Bric Brother [phone]         Code Status History    Date Active Date Inactive Code Status Order ID Comments User Context   06/17/2016  1:43 PM 06/26/2016  4:49 PM Full Code [id number]  Elgie Collard, PA-C Inpatient   05/14/2016 11:28 AM 05/14/2016  8:02 PM Full Code [id number]  Adrian Prows, MD Inpatient   03/29/2016  4:45 PM 03/30/2016  5:58 PM Full Code [id number]  Dron Tanna Furry, MD ED       Chief Complaint  Patient presents with  . Discharge Note    HISTORY OF PRESENT ILLNESS:  This is a 73-YO male who is for discharge home with Home health PT and SW.  He was admitted to [facility] on 06/26/2016 for short-term rehabilitation following an admission at [facility] 06/17/2016-06/26/2016 with CAD for which he had CABG X 4 on 06/17/16 utilizing LIMA to LAD, SVG to Ramus, SVG to PDA and SVG to Diagonal. He also had endoscopic harvest of greater saphenous vein from right leg. He required transfusion of packed cells for expected post op blood loss anemia. He was diuresed aggressively with IV Lasix and Zaroxolyn for hypervolemia. He developed visual field disturbance on POD#4. CT scan of the head showed a right cerebellar area of hypo-attenuation which would correlate with patient's symptoms. Neurology evaluated patient and was in agreement that he likely suffered an ischemic stroke. MRI could not be done due to recent placement of the sternal wires and can be done safely in 30 days. He was restarted on aspirin and Plavix. Preoperative A1c was >13.  CBGs were controlled in the hospital with insulin. Of note, in  January she had left-sided facial droop, left-sided paresthesias and aphasia. CT/MRI showed lacunar infarct of the left brain stem. Echocardiogram showed moderate to severe LV dysfunction which was concerning for underlying coronary disease. He denied symptoms of angina 10. Cardiology performed a stress tests which was markedly positive. Cardiac catheterization was done on 220/2018 which showed a reduced EF and significant CAD. It was felt that coronary bypass grafting was indicated. He recently quit smoking due to recent stroke. He has PMH of nicotine abuse, acute stroke from January 2018 and diabetes mellitus.  Patient was admitted to this facility for short-term rehabilitation after the patient's recent hospitalization.  Patient has completed SNF rehabilitation and therapy has cleared the patient for discharge.    PAST MEDICAL HISTORY:  Past Medical History:  Diagnosis Date  . Abnormal nuclear stress test   . Coronary artery disease    3-vessel  . Diabetes mellitus without complication ([place])   . GERD (gastroesophageal reflux disease)   . Gout   . Headache   . History of tobacco abuse   . Hyperlipidemia   . Hypertension   . Ischemic stroke ([place])   . Stroke ([place]) 03/29/2016   left facial droop  . Stroke due to embolism ([place])   . Visual disturbance      CURRENT MEDICATIONS: Reviewed  Patient's Medications  New Prescriptions   No medications on file  Previous Medications  ALLOPURINOL (ZYLOPRIM) 100 MG TABLET    Take 1 tablet by mouth 2 (two) times daily.    ASPIRIN 81 MG TABLET    Take 81 mg by mouth daily.    ATORVASTATIN (LIPITOR) 40 MG TABLET    Take 1 tablet (40 mg total) by mouth daily at 6 PM.   CARBOXYMETHYLCELLUL-GLYCERIN 0.5-0.9 % SOLN    Apply 1 drop to eye 3 (three) times daily as needed.   CLOPIDOGREL (PLAVIX) 75 MG TABLET    Take 75 mg by mouth daily.   FAMOTIDINE (PEPCID) 40 MG TABLET    Take 40 mg by mouth 2 (two) times daily.   FUROSEMIDE (LASIX) 40 MG TABLET     Take 1 tablet (40 mg total) by mouth daily. For 7 Days   GUAIFENESIN (MUCINEX) 600 MG 12 HR TABLET    Take 1 tablet (600 mg total) by mouth 2 (two) times daily as needed.   INSULIN ASPART (NOVOLOG) 100 UNIT/ML INJECTION    Inject 3 Units into the skin 3 (three) times daily with meals.   INSULIN DETEMIR (LEVEMIR) 100 UNIT/ML INJECTION    Inject 0.18 mLs (18 Units total) into the skin 2 (two) times daily.   LISINOPRIL (PRINIVIL,ZESTRIL) 10 MG TABLET    Take 1 tablet (10 mg total) by mouth daily.   METFORMIN (GLUCOPHAGE) 500 MG TABLET    Take 1 tablet (500 mg total) by mouth 2 (two) times daily with a meal.   METOPROLOL TARTRATE (LOPRESSOR) 25 MG TABLET    Take 1 tablet (25 mg total) by mouth 2 (two) times daily.   OXYCODONE (OXY IR/ROXICODONE) 5 MG IMMEDIATE RELEASE TABLET    Take 1-2 tablets (5-10 mg total) by mouth every 3 (three) hours as needed for severe pain.   POTASSIUM CHLORIDE SA (K-DUR,KLOR-CON) 20 MEQ TABLET    Take 1 tablet (20 mEq total) by mouth daily. For 7 Days  Modified Medications   No medications on file  Discontinued Medications   No medications on file     Allergies  Allergen Reactions  . No Known Allergies      REVIEW OF SYSTEMS:  GENERAL: no change in appetite, no fatigue, no weight changes, no fever, chills or weakness EYES: complains of not being able to read EARS: Denies change in hearing, ringing in ears, or earache NOSE: Denies nasal congestion or epistaxis MOUTH and THROAT: Denies oral discomfort, gingival pain or bleeding, pain from teeth or hoarseness   RESPIRATORY: no cough, SOB, DOE, wheezing, hemoptysis CARDIAC: no chest pain, edema or palpitations GI: no abdominal pain, diarrhea, constipation, heart burn, nausea or vomiting GU: Denies dysuria, frequency, hematuria, incontinence, or discharge PSYCHIATRIC: Denies feeling of depression or anxiety. No report of hallucinations, insomnia, paranoia, or agitation    PHYSICAL EXAMINATION  GENERAL  APPEARANCE: Well nourished. In no acute distress. Normal body habitus SKIN:  Right leg medial surgical incision is healed, right groin puncture site is healed , bilateral upper quadrant and chest midline incisions are healed HEAD: Normal in size and contour. No evidence of trauma EYES: Lids open and close normally. No blepharitis, entropion or ectropion. PERRL. Conjunctivae are clear and sclerae are white. Lenses are without opacity EARS: Pinnae are normal. Patient hears normal voice tunes of the examiner MOUTH and THROAT: Lips are without lesions. Oral mucosa is moist and without lesions. Tongue is normal in shape, size, and color and without lesions NECK: supple, trachea midline, no neck masses, no thyroid tenderness, no thyromegaly LYMPHATICS: no LAN  in the neck, no supraclavicular LAN RESPIRATORY: breathing is even & unlabored, BS CTAB CARDIAC: RRR, no murmur,no extra heart sounds, no edema GI: abdomen soft, normal BS, no masses, no tenderness, no hepatomegaly, no splenomegaly EXTREMITIES:  Able to move X 4 extremities PSYCHIATRIC: Alert and oriented X 3. Affect and behavior are appropriate   LABS/RADIOLOGY: Labs reviewed: Basic Metabolic Panel:  Recent Labs  06/17/16 2000  06/18/16 0316 06/18/16 1730  06/20/16 0356 06/21/16 0538 06/22/16 0234 07/01/16  NA  --   < > 137  --   < > 133* 133* 136 141  K  --   < > 3.8  --   < > 3.2* 4.2 3.8 4.2  CL  --   < > 105  --   < > 100* 98* 104  --   CO2  --   --  23  --   < > 26 24 24   --   GLUCOSE  --   < > 123*  --   < > 108* 183* 103*  --   BUN  --   < > 12  --   < > 19 24* 25* 14  CREATININE 0.91  < > 0.93 0.99  < > 0.95 1.24 1.11 0.9  CALCIUM  --   --  7.8*  --   < > 7.5* 7.9* 7.9*  --   MG 2.5*  --  2.4 2.3  --   --   --   --   --   < > = values in this interval not displayed. Liver Function Tests:  Recent Labs  06/06/16 0847 06/06/16 1129 06/20/16 0356  AST 15 20 20   ALT 21 23 19   ALKPHOS 124 129* 63  BILITOT 1.0 1.0 1.1    PROT 6.4* 6.4* 4.8*  ALBUMIN 3.8 3.6 2.4*   CBC:  Recent Labs  03/29/16 0859  06/06/16 1129  06/20/16 0356 06/21/16 0538 06/22/16 0234 07/01/16  WBC 9.1  < > 10.3  < > 7.4 7.5 6.5 6.9  NEUTROABS 6.6  --  7.8*  --   --   --   --   --   HGB 16.2  < > 12.4*  < > 8.3* 8.7* 8.9* 8.0*  HCT 47.4  < > 34.5*  < > 24.4* 26.1* 27.2* 25*  MCV 88.1  < > 82.9  < > 85.9 86.1 86.6  --   PLT 168  < > 210  < > 112* 137* 155 280  < > = values in this interval not displayed. Lipid Panel:  Recent Labs  03/30/16 0630 06/22/16 0234  HDL 47 24*   CBG:  Recent Labs  06/25/16 2133 06/26/16 0627 06/26/16 1144  GLUCAP 196* 86 117*      Dg Chest 2 View  Result Date: 06/22/2016 CLINICAL DATA:  Status post CABG. EXAM: CHEST  2 VIEW COMPARISON:  Yesterday. FINDINGS: The right jugular Swan-Ganz catheter sheath has been removed. No pneumothorax. Stable post CABG changes. Normal sized heart. Decreased right basilar atelectasis. Stable left basilar atelectasis. Thoracic spine degenerative changes. Diffuse osteopenia. IMPRESSION: Stable left basilar atelectasis and minimally improved right basilar atelectasis. Electronically Signed   By: Claudie Revering M.D.   On: 06/22/2016 07:52    ASSESSMENT/PLAN:  Physical   deconditioning - for Home health PT for therapeutic strengthening exercises; fall precautions  Ischemic stroke - continue Plavix 75 mg 1 tab by mouth daily, Lipitor 40 mg 1 tab by mouth daily, aspirin 81  mg 1 tab by mouth daily, lisinopril 10 mg 1 tab by mouth daily, Lipitor 40 mg 1 tab by mouth daily; follow-up with neurology  CAD - S/P CABG 4, follow-up with cardiothoracic surgery, Dr. Tharon Aquas Trigt, continue oxycodone 5 mg 1-2 tabs by mouth every 3 hours when necessary, aspirin 81 mg 1 tab by mouth daily and Plavix 75 mg 1 tab by mouth daily, follow-up with cardiology, Dr. Filbert Schilder, on 07/11/16 at 2:15 PM  Diabetes mellitus, uncontrolled - continue Levemir 100 units/mL injected 18 units  subcutaneous twice a day, NovoLog 100 units/mL inject 3 units subcutaneous 3 times a day with meals for CBG >150 and metformin 500 mg 1 tab by mouth twice a day Lab Results  Component Value Date   HGBA1C 12.2 (H) 06/22/2016   Left homonymous hemianopsia - follow up with Dr. Lucita Ferrara, ophthalmology; fall precautions  Essential hypertension - continue lisinopril 10 mg 1 tab by mouth daily hold for SBP <110 and metoprolol tartrate 25 mg 1 tab by mouth twice a day  Hyperlipidemia - continue Lipitor 40 mg 1 tab by mouth daily Lab Results  Component Value Date   CHOL 97 06/22/2016   HDL 24 (L) 06/22/2016   LDLCALC 45 06/22/2016   TRIG 142 06/22/2016   CHOLHDL 4.0 06/22/2016   Gout - continue allopurinol 100 mg 1 tab by mouth twice a day  GERD - continue Pepcid 40 mg 1 tab by mouth twice a day  Dry eyes - continue Refresh Tears 0.5-0.9% 1 drop into both eyes 3 times a day when necessary  Acute blood loss anemia - stable Lab Results  Component Value Date   HGB 8.0 (A) 07/01/2016       I have filled out patient's discharge paperwork and written prescriptions.  Patient will receive home health PT and SW.  DME provided:  None  Total discharge time: Less than 30 minutes  Discharge time involved coordination of the discharge process with social worker, nursing staff and therapy department. Medical justification for home health services verified.   Matalyn Nawaz C. [place] - NP    [employer] [phone]

## 2016-07-04 DIAGNOSIS — I2510 Atherosclerotic heart disease of native coronary artery without angina pectoris: Secondary | ICD-10-CM | POA: Diagnosis not present

## 2016-07-04 DIAGNOSIS — R600 Localized edema: Secondary | ICD-10-CM | POA: Diagnosis not present

## 2016-07-04 DIAGNOSIS — I10 Essential (primary) hypertension: Secondary | ICD-10-CM | POA: Diagnosis not present

## 2016-07-04 DIAGNOSIS — K219 Gastro-esophageal reflux disease without esophagitis: Secondary | ICD-10-CM | POA: Diagnosis not present

## 2016-07-04 DIAGNOSIS — I639 Cerebral infarction, unspecified: Secondary | ICD-10-CM | POA: Diagnosis not present

## 2016-07-04 DIAGNOSIS — Z951 Presence of aortocoronary bypass graft: Secondary | ICD-10-CM | POA: Diagnosis not present

## 2016-07-04 DIAGNOSIS — E119 Type 2 diabetes mellitus without complications: Secondary | ICD-10-CM | POA: Diagnosis not present

## 2016-07-04 DIAGNOSIS — E782 Mixed hyperlipidemia: Secondary | ICD-10-CM | POA: Diagnosis not present

## 2016-07-04 DIAGNOSIS — H539 Unspecified visual disturbance: Secondary | ICD-10-CM | POA: Diagnosis not present

## 2016-07-11 DIAGNOSIS — I6990 Unspecified sequelae of unspecified cerebrovascular disease: Secondary | ICD-10-CM | POA: Diagnosis not present

## 2016-07-11 DIAGNOSIS — I10 Essential (primary) hypertension: Secondary | ICD-10-CM | POA: Diagnosis not present

## 2016-07-11 DIAGNOSIS — I2510 Atherosclerotic heart disease of native coronary artery without angina pectoris: Secondary | ICD-10-CM | POA: Diagnosis not present

## 2016-07-11 DIAGNOSIS — Z951 Presence of aortocoronary bypass graft: Secondary | ICD-10-CM | POA: Diagnosis not present

## 2016-07-17 DIAGNOSIS — H26492 Other secondary cataract, left eye: Secondary | ICD-10-CM | POA: Diagnosis not present

## 2016-07-17 DIAGNOSIS — Z961 Presence of intraocular lens: Secondary | ICD-10-CM | POA: Diagnosis not present

## 2016-07-22 ENCOUNTER — Ambulatory Visit: Payer: PPO | Admitting: Physical Therapy

## 2016-07-23 ENCOUNTER — Other Ambulatory Visit: Payer: Self-pay | Admitting: Cardiothoracic Surgery

## 2016-07-23 DIAGNOSIS — Z951 Presence of aortocoronary bypass graft: Secondary | ICD-10-CM

## 2016-07-24 ENCOUNTER — Ambulatory Visit (INDEPENDENT_AMBULATORY_CARE_PROVIDER_SITE_OTHER): Payer: Self-pay | Admitting: Cardiothoracic Surgery

## 2016-07-24 ENCOUNTER — Encounter: Payer: Self-pay | Admitting: Cardiothoracic Surgery

## 2016-07-24 ENCOUNTER — Ambulatory Visit
Admission: RE | Admit: 2016-07-24 | Discharge: 2016-07-24 | Disposition: A | Discharge status: Home / Self Care | Payer: PPO | Source: Ambulatory Visit | Attending: Cardiothoracic Surgery | Admitting: Cardiothoracic Surgery

## 2016-07-24 VITALS — BP 125/69 | Resp 16 | Ht 72.0 in | Wt 182.0 lb

## 2016-07-24 DIAGNOSIS — I638 Other cerebral infarction: Secondary | ICD-10-CM

## 2016-07-24 DIAGNOSIS — Z951 Presence of aortocoronary bypass graft: Secondary | ICD-10-CM

## 2016-07-24 DIAGNOSIS — J90 Pleural effusion, not elsewhere classified: Secondary | ICD-10-CM | POA: Diagnosis not present

## 2016-07-24 DIAGNOSIS — I6389 Other cerebral infarction: Secondary | ICD-10-CM

## 2016-07-24 DIAGNOSIS — I2510 Atherosclerotic heart disease of native coronary artery without angina pectoris: Secondary | ICD-10-CM

## 2016-07-24 NOTE — Progress Notes (Signed)
PCP is [place] Referring Provider is Adrian Prows, MD  Chief Complaint  Patient presents with  . Routine Post Op    s/p CABG X 4 .Drew KitchenMarland Kitchen3/26/18 with a CXR    HPI: 1 month follow-up after multivessel CABG for diabetic pattern coronary artery disease previous non-ST elevation MI and previous stroke. The patient is now at home doing well. His postoperative visual problems have significantly improved after undergoing retinal surgery with Dr. Lucita Ferrara. He has stopped smoking. He is walking daily. He is signed up to start cardiac rehabilitation. He has followed up with his cardiologist Dr. Einar Gip. He is following a heart healthy diet. He is taking his diabetic medications but has run out of test strips and is not checking his sugars. \ He denies symptoms of recurrent angina or symptoms of CHF. Surgical incisions are well-healed.  Chest x-ray done today shows clear lung fields, no pleural effusion  Past Medical History:  Diagnosis Date  . Abnormal nuclear stress test   . Coronary artery disease    3-vessel  . Diabetes mellitus without complication ([place])   . GERD (gastroesophageal reflux disease)   . Gout   . Headache   . History of tobacco abuse   . Hyperlipidemia   . Hypertension   . Ischemic stroke ([place])   . Stroke ([place]) 03/29/2016   left facial droop  . Stroke due to embolism ([place])   . Visual disturbance     Past Surgical History:  Procedure Laterality Date  . CORONARY ARTERY BYPASS GRAFT N/A 06/17/2016   Procedure: CORONARY ARTERY BYPASS GRAFTING (CABG) x four using left internal mammary artery and right greater saphenous leg vein using endoscope.;  Surgeon: [name], MD;  Location: [place];  Service: Open Heart Surgery;  Laterality: N/A;  . LEFT HEART CATH AND CORONARY ANGIOGRAPHY N/A 05/14/2016   Procedure: Left Heart Cath and Coronary Angiography;  Surgeon: Adrian Prows, MD;  Location: [place] [facility];  Service: Cardiovascular;  Laterality:  N/A;  . TEE WITHOUT CARDIOVERSION N/A 06/17/2016   Procedure: TRANSESOPHAGEAL ECHOCARDIOGRAM (TEE);  Surgeon: [name], MD;  Location: [place];  Service: Open Heart Surgery;  Laterality: N/A;  . TONSILLECTOMY      No family history on file.  Social History Social History  Substance Use Topics  . Smoking status: Former Smoker    Packs/day: 1.50    Years: 48.00    Types: Cigarettes    Quit date: 05/07/2016  . Smokeless tobacco: Never Used  . Alcohol use Yes     Comment: couple drinks per week    Current Outpatient Prescriptions  Medication Sig Dispense Refill  . allopurinol (ZYLOPRIM) 100 MG tablet Take 1 tablet by mouth 2 (two) times daily.   0  . aspirin 81 MG tablet Take 81 mg by mouth daily.     Drew Harrison atorvastatin (LIPITOR) 40 MG tablet Take 1 tablet (40 mg total) by mouth daily at 6 PM. 30 tablet 0  . clopidogrel (PLAVIX) 75 MG tablet Take 75 mg by mouth daily.    . famotidine (PEPCID) 40 MG tablet Take 40 mg by mouth 2 (two) times daily.    . insulin aspart (NOVOLOG) 100 UNIT/ML injection Inject 3 Units into the skin 3 (three) times daily with meals. 10 mL 11  . insulin detemir (LEVEMIR) 100 UNIT/ML injection Inject 0.18 mLs (18 Units total) into the skin 2 (two) times daily. 10 mL 11  . lisinopril (PRINIVIL,ZESTRIL) 10 MG tablet Take  1 tablet (10 mg total) by mouth daily.    . metFORMIN (GLUCOPHAGE) 500 MG tablet Take 1 tablet (500 mg total) by mouth 2 (two) times daily with a meal. 60 tablet 0  . metoprolol tartrate (LOPRESSOR) 25 MG tablet Take 1 tablet (25 mg total) by mouth 2 (two) times daily.     No current facility-administered medications for this visit.     Allergies  Allergen Reactions  . No Known Allergies     Review of Systems   Improved strength and appetite No fever No edema Anxious to resume cardiac rehabilitation Plans to return to work at [employer] in early June  BP 125/69 (BP Location: Left Arm, Patient Position: Sitting, Cuff  Size: Large)   Resp 16   Ht 6' (1.829 m)   Wt 182 lb (82.6 kg)   SpO2 96% Comment: ON RA  BMI 24.68 kg/m  Physical Exam      Exam    General- alert and comfortable   Lungs- clear without rales, wheezes   Cor- regular rate and rhythm, no murmur , gallop   Abdomen- soft, non-tender   Extremities - warm, non-tender, minimal edema   Neuro- oriented, appropriate, no focal weakness   Diagnostic Tests: Chest x-ray clear  Impression: Excellent early recovery after multivessel CABG in the setting of poorly controlled diabetes and previous stroke.  He understands that he should not lift more than 20 pounds until 3 months after surgery-July. He understands he can drive now. He understands importance of taking Lipitor and aspirin indefinitely for his bypass graft patency and the importance of smoking cessation for the same reason.  Plan: He will be followed by his cardiologist Dr.Ganji. We will be able to see him back for any surgical issues or wound problems.    Len Childs, MD [facility] [phone]

## 2016-07-25 DIAGNOSIS — Z9849 Cataract extraction status, unspecified eye: Secondary | ICD-10-CM | POA: Diagnosis not present

## 2016-08-05 ENCOUNTER — Encounter: Payer: PPO | Attending: Cardiology | Admitting: Nutrition

## 2016-08-05 VITALS — Ht 72.0 in | Wt 191.8 lb

## 2016-08-05 DIAGNOSIS — Z951 Presence of aortocoronary bypass graft: Secondary | ICD-10-CM | POA: Insufficient documentation

## 2016-08-05 DIAGNOSIS — Z87891 Personal history of nicotine dependence: Secondary | ICD-10-CM | POA: Insufficient documentation

## 2016-08-05 DIAGNOSIS — E1165 Type 2 diabetes mellitus with hyperglycemia: Secondary | ICD-10-CM

## 2016-08-05 DIAGNOSIS — Z713 Dietary counseling and surveillance: Secondary | ICD-10-CM | POA: Insufficient documentation

## 2016-08-05 DIAGNOSIS — Z8673 Personal history of transient ischemic attack (TIA), and cerebral infarction without residual deficits: Secondary | ICD-10-CM | POA: Insufficient documentation

## 2016-08-05 DIAGNOSIS — E118 Type 2 diabetes mellitus with unspecified complications: Secondary | ICD-10-CM

## 2016-08-05 DIAGNOSIS — E1151 Type 2 diabetes mellitus with diabetic peripheral angiopathy without gangrene: Secondary | ICD-10-CM | POA: Diagnosis not present

## 2016-08-05 DIAGNOSIS — IMO0002 Reserved for concepts with insufficient information to code with codable children: Secondary | ICD-10-CM

## 2016-08-05 DIAGNOSIS — Z794 Long term (current) use of insulin: Secondary | ICD-10-CM

## 2016-08-05 NOTE — Patient Instructions (Signed)
Goals 1. Follow My Plate 2. Eat meals on time as discussed. 3. Eat 45-60 grams of carbs per meal 4. Take 3 units of Novolog right before meals-eat right after taking insulin. 5. Ask medical provider if you can take 36 units of Levemir once a day instead of 18 units twice a day for more effective blood sugar control. 6. Let MD know if BS are near or below 70's before meals or at night. 7. Increase high fiber foods and fresh fruits and vegetables. Use Mrs. Dash or other herbs and spices. Avoid processed meats and other processed foods.  Keep up the great job!!!

## 2016-08-05 NOTE — Progress Notes (Signed)
Medical Nutrition Therapy:  Appt start time: 1330 end time:  1430.  Assessment:  Primary concerns today: Diabetes Type 2. Lives by himself. Recently had a CVA in January 2018 and then had 4x  CABG in March 2018. Has quit smoking, drinking alcohol and in the process of changing his diet to less processed foods and more baked and broiled foods. He will start Cardiac Rehab in a month or so. He has regained his vision after the stroke. He reports some memory issues at times and his taste buds are still poor.. Works part time (30+) hrs at [employer] but hasn't gone back to work yet..    A1C prior to March was below 6.5% but had jumped to 12.2% in March 2018. Lipid profile WNL. Current diet is low in fresh fruits, vegetables and whole grains. He is working on cutting out all fried foods and working on increasing healthier foods options. Motivated and engaged in making changes.  Taking 18 units of Levermir BID and 3 units of Novolog with meals.     BS log brought in  FBS are 110-120's in am, before dinner 100- 170 mg/dl.  Sees [facility].   [place]. Not much walking or other exercise but he will start walking some as he begins to feel better.       Lab Results  Component Value Date   HGBA1C 12.2 (H) 06/22/2016   Lipid Panel     Component Value Date/Time   CHOL 97 06/22/2016 0234   TRIG 142 06/22/2016 0234   HDL 24 (L) 06/22/2016 0234   CHOLHDL 4.0 06/22/2016 0234   VLDL 28 06/22/2016 0234   LDLCALC 45 06/22/2016 0234       Preferred Learning Style:   No preference indicated   Learning Readiness:  Ready  Change in progress   MEDICATIONS: See list   DIETARY INTAKE:  Eats 3 meals per day. Drinking water. Cutting out processed and fried foods. Working eating meals on better time schedule.   Usual physical activity: ADL  Estimated energy needs: 2000  calories 225 g carbohydrates 150 g protein 56 g fat  Progress Towards  Goal(s):  In progress.   Nutritional Diagnosis:  NB-1.1 Food and nutrition-related knowledge deficit As related to Diabetes.  As evidenced by A1C 12.2%..    Intervention:  Nutrition and Diabetes education provided on My Plate, CHO counting, meal planning, portion sizes, timing of meals, avoiding snacks between meals unless having a low blood sugar, target ranges for A1C and blood sugars, signs/symptoms and treatment of hyper/hypoglycemia, monitoring blood sugars, taking medications as prescribed, benefits of exercising 30 minutes per day and prevention of complications of DM.  Heart Healthy Diet, Low Fat High Fiber  .Goals 1. Follow My Plate 2. Eat meals on time as discussed. 3. Eat 45-60 grams of carbs per meal 4. Take 3 units of Novolog right before meals-eat right after taking Novolog meal time insulin. 5. Ask medical provider if you can take 36 units of Levemir once a day instead of 18 units twice a day for more effective blood sugar control. 6. Let MD know if BS are near or below 70's before meals or at night. 7. Increase high fiber foods and fresh fruits and vegetables. Use Mrs. Dash or other herbs and spices. Avoid processed meats and other processed foods.  Keep up the great job!!!  Teaching Method Utilized:  Visual Auditory Hands on  Handouts given  during visit include:  The Plate Method  Nutrition Therapy for CAGB and DM  Meal Plan Card    Barriers to learning/adherence to lifestyle change: none  Demonstrated degree of understanding via:  Teach Back   Monitoring/Evaluation:  Dietary intake, exercise, meal planning, SBG, and body weight in 1 month(s).

## 2016-08-12 DIAGNOSIS — I2510 Atherosclerotic heart disease of native coronary artery without angina pectoris: Secondary | ICD-10-CM | POA: Diagnosis not present

## 2016-08-12 DIAGNOSIS — I6990 Unspecified sequelae of unspecified cerebrovascular disease: Secondary | ICD-10-CM | POA: Diagnosis not present

## 2016-08-12 DIAGNOSIS — I10 Essential (primary) hypertension: Secondary | ICD-10-CM | POA: Diagnosis not present

## 2016-08-12 DIAGNOSIS — Z951 Presence of aortocoronary bypass graft: Secondary | ICD-10-CM | POA: Diagnosis not present

## 2016-09-03 ENCOUNTER — Encounter (HOSPITAL_COMMUNITY)
Admission: RE | Admit: 2016-09-03 | Discharge: 2016-09-03 | Disposition: A | Discharge status: Home / Self Care | Payer: PPO | Source: Ambulatory Visit | Attending: Cardiology | Admitting: Cardiology

## 2016-09-03 ENCOUNTER — Encounter (HOSPITAL_COMMUNITY): Payer: Self-pay

## 2016-09-03 VITALS — BP 162/88 | HR 82 | Ht 72.0 in | Wt 194.4 lb

## 2016-09-03 DIAGNOSIS — Z951 Presence of aortocoronary bypass graft: Secondary | ICD-10-CM

## 2016-09-03 DIAGNOSIS — Z7982 Long term (current) use of aspirin: Secondary | ICD-10-CM | POA: Diagnosis not present

## 2016-09-03 DIAGNOSIS — Z8673 Personal history of transient ischemic attack (TIA), and cerebral infarction without residual deficits: Secondary | ICD-10-CM | POA: Insufficient documentation

## 2016-09-03 DIAGNOSIS — Z7902 Long term (current) use of antithrombotics/antiplatelets: Secondary | ICD-10-CM | POA: Insufficient documentation

## 2016-09-03 DIAGNOSIS — Z713 Dietary counseling and surveillance: Secondary | ICD-10-CM | POA: Insufficient documentation

## 2016-09-03 DIAGNOSIS — Z87891 Personal history of nicotine dependence: Secondary | ICD-10-CM | POA: Insufficient documentation

## 2016-09-03 DIAGNOSIS — E1151 Type 2 diabetes mellitus with diabetic peripheral angiopathy without gangrene: Secondary | ICD-10-CM | POA: Diagnosis not present

## 2016-09-03 DIAGNOSIS — E785 Hyperlipidemia, unspecified: Secondary | ICD-10-CM | POA: Insufficient documentation

## 2016-09-03 DIAGNOSIS — Z79899 Other long term (current) drug therapy: Secondary | ICD-10-CM | POA: Diagnosis not present

## 2016-09-03 DIAGNOSIS — I10 Essential (primary) hypertension: Secondary | ICD-10-CM | POA: Insufficient documentation

## 2016-09-03 DIAGNOSIS — Z7984 Long term (current) use of oral hypoglycemic drugs: Secondary | ICD-10-CM | POA: Insufficient documentation

## 2016-09-03 NOTE — Progress Notes (Signed)
Daily Session Note  Patient Details  Name: Drew Harrison MRN: [id number] Date of Birth: 06-09-1950 Referring Provider:     CARDIAC REHAB PHASE II EXERCISE from 09/03/2016 in [facility]  Referring Provider  Dr. Einar Gip      Encounter Date: 09/03/2016  Check In:     Session Check In - 09/03/16 1300      Check-In   Location [facility]   Staff Present Russella Dar, MS, EP, [facility], Exercise Physiologist;Gregory Luther Parody, BS, EP, Exercise Physiologist   Supervising physician immediately available to respond to emergencies See telemetry face sheet for immediately available MD   Tobacco Cessation --  Quit 03/29/2016   Warm-up and Cool-down Performed as group-led instruction   Resistance Training Performed Yes   VAD Patient? No     Pain Assessment   Currently in Pain? No/denies   Pain Score 0-No pain   Multiple Pain Sites No      Capillary Blood Glucose: No results found for this or any previous visit (from the past 24 hour(s)).    History  Smoking Status  . Former Smoker  . Packs/day: 1.50  . Years: 48.00  . Types: Cigarettes  . Quit date: 03/29/2016  Smokeless Tobacco  . Never Used    Goals Met:  Independence with exercise equipment Exercise tolerated well  Goals Unmet:  Not Applicable  Comments: Check out: 15:00   Dr. Kate Sable is Medical Director for [facility] and [facility].

## 2016-09-03 NOTE — Progress Notes (Signed)
Cardiac/Pulmonary Rehab Medication Review by a Pharmacist  Does the patient  feel that his/her medications are working for him/her?  yes  Has the patient been experiencing any side effects to the medications prescribed?  no  Does the patient measure his/her own blood pressure or blood glucose at home?  yes   Does the patient have any problems obtaining medications due to transportation or finances?   no  Understanding of regimen: good Understanding of indications: good Potential of compliance: good  Questions asked to Determine Patient Understanding of Medication Regimen:  1. What is the name of the medication?  2. What is the medication used for?  3. When should it be taken?  4. How much should be taken?  5. How will you take it?  6. What side effects should you report?  Understanding Defined as: Excellent: All questions above are correct Good: Questions 1-4 are correct Fair: Questions 1-2 are correct  Poor: 1 or none of the above questions are correct   Pharmacist comments: 66 yo male presents to cardiac rehab post CABG x 4 vessels. He had his list of medications and is aware of the indications and uses of most. I reviewed the importance of each medication in his regimen.We discussed his famotidine that he is on and he didn't think he really needed it anymore. I suggested decreasing the dose and decreasing the interval, and considering stopping if no longer needed. He does monitor his glucose but not Blood Pressure. Suggested obtaining a BP cuff to monitor HR and BP.   Thanks for the opportunity to participate in the care of this patient,  Drew Harrison, BS [place]ena [place], BCPS Clinical Pharmacist Pager [phone]     Cristy Friedlander 09/03/2016 2:08 PM

## 2016-09-03 NOTE — Progress Notes (Signed)
Cardiac Individual Treatment Plan  Patient Details  Name: Drew Harrison MRN: [id number] Date of Birth: November 29, 1950 Referring Provider:     CARDIAC REHAB PHASE II EXERCISE from 09/03/2016 in [place]  Referring Provider  Dr. Einar Gip      Initial Encounter Date:    CARDIAC REHAB PHASE II EXERCISE from 09/03/2016 in [place]  Date  09/03/16  Referring Provider  Dr. Einar Gip      Visit Diagnosis: S/P CABG x 4  Patient's Home Medications on Admission:  Current Outpatient Prescriptions:  .  allopurinol (ZYLOPRIM) 100 MG tablet, Take 1 tablet by mouth 2 (two) times daily. , Disp: , Rfl: 0 .  aspirin 81 MG tablet, Take 81 mg by mouth daily. , Disp: , Rfl:  .  atorvastatin (LIPITOR) 40 MG tablet, Take 1 tablet (40 mg total) by mouth daily at 6 PM., Disp: 30 tablet, Rfl: 0 .  clopidogrel (PLAVIX) 75 MG tablet, Take 75 mg by mouth daily., Disp: , Rfl:  .  famotidine (PEPCID) 40 MG tablet, Take 40 mg by mouth 2 (two) times daily., Disp: , Rfl:  .  insulin aspart (NOVOLOG) 100 UNIT/ML injection, Inject 3 Units into the skin 3 (three) times daily with meals., Disp: 10 mL, Rfl: 11 .  insulin detemir (LEVEMIR) 100 UNIT/ML injection, Inject 0.18 mLs (18 Units total) into the skin 2 (two) times daily., Disp: 10 mL, Rfl: 11 .  lisinopril (PRINIVIL,ZESTRIL) 10 MG tablet, Take 1 tablet (10 mg total) by mouth daily., Disp: , Rfl:  .  metFORMIN (GLUCOPHAGE) 500 MG tablet, Take 1 tablet (500 mg total) by mouth 2 (two) times daily with a meal., Disp: 60 tablet, Rfl: 0 .  metoprolol tartrate (LOPRESSOR) 25 MG tablet, Take 1 tablet (25 mg total) by mouth 2 (two) times daily., Disp: , Rfl:   Past Medical History: Past Medical History:  Diagnosis Date  . Abnormal nuclear stress test   . Coronary artery disease    3-vessel  . Diabetes mellitus without complication ([place])   . GERD (gastroesophageal reflux disease)   . Gout   . Headache   . History of tobacco  abuse   . Hyperlipidemia   . Hypertension   . Ischemic stroke ([place])   . Stroke ([place]) 03/29/2016   left facial droop  . Stroke due to embolism ([place])   . Visual disturbance     Tobacco Use: History  Smoking Status  . Former Smoker  . Packs/day: 1.50  . Years: 48.00  . Types: Cigarettes  . Quit date: 03/29/2016  Smokeless Tobacco  . Never Used    Labs: Recent Review Flowsheet Data    Labs for ITP Cardiac and Pulmonary Rehab Latest Ref Rng & Units 06/18/2016 06/19/2016 06/20/2016 06/21/2016 06/22/2016   Cholestrol 0 - 200 mg/dL - - - - 97   LDLCALC 0 - 99 mg/dL - - - - 45   HDL >40 mg/dL - - - - 24(L)   Trlycerides <150 mg/dL - - - - 142   Hemoglobin A1c 4.8 - 5.6 % - - - - 12.2(H)   PHART 7.350 - 7.450 - - - - -   PCO2ART 32.0 - 48.0 mmHg - - - - -   HCO3 20.0 - 28.0 mmol/L - - - - -   TCO2 0 - 100 mmol/L 25 25 - - -   ACIDBASEDEF 0.0 - 2.0 mmol/L - - - - -   O2SAT % - 54.5 58.3  55.6 12.6      Capillary Blood Glucose: Lab Results  Component Value Date   GLUCAP 117 (H) 06/26/2016   GLUCAP 86 06/26/2016   GLUCAP 196 (H) 06/25/2016   GLUCAP 82 06/25/2016   GLUCAP 185 (H) 06/25/2016     Exercise Target Goals: Date: 09/03/16  Exercise Program Goal: Individual exercise prescription set with THRR, safety & activity barriers. Participant demonstrates ability to understand and report RPE using BORG scale, to self-measure pulse accurately, and to acknowledge the importance of the exercise prescription.  Exercise Prescription Goal: Starting with aerobic activity 30 plus minutes a day, 3 days per week for initial exercise prescription. Provide home exercise prescription and guidelines that participant acknowledges understanding prior to discharge.  Activity Barriers & Risk Stratification:   6 Minute Walk:     6 Minute Walk    Row Name 09/03/16 1507         6 Minute Walk   Phase Initial     Distance 1200 feet     Distance % Change 0 %     Walk Time 6 minutes     #  of Rest Breaks 0     MPH 2.27     METS 2.74     RPE 12     Perceived Dyspnea  16     VO2 Peak 12.23     Symptoms No     Resting HR 83 bpm     Resting BP 162/88     Max Ex. HR 106 bpm     Max Ex. BP 174/94     2 Minute Post BP 168/84        Oxygen Initial Assessment:   Oxygen Re-Evaluation:   Oxygen Discharge (Final Oxygen Re-Evaluation):   Initial Exercise Prescription:     Initial Exercise Prescription - 09/03/16 1500      Date of Initial Exercise RX and Referring Provider   Date 09/03/16   Referring Provider Dr. Einar Gip     NuStep   Level 2   SPM 9   Minutes 15   METs 1.7     Arm Ergometer   Level 2   Watts 22   RPM 22   Minutes 20   METs 2.5     Prescription Details   Frequency (times per week) 3   Duration Progress to 30 minutes of continuous aerobic without signs/symptoms of physical distress     Intensity   THRR 40-80% of Max Heartrate 111-125-140   Ratings of Perceived Exertion 11-13   Perceived Dyspnea 0-4     Progression   Progression Continue progressive overload as per policy without signs/symptoms or physical distress.     Resistance Training   Training Prescription Yes   Weight 1   Reps 10-15      Perform Capillary Blood Glucose checks as needed.  Exercise Prescription Changes:   Exercise Comments:   Exercise Goals and Review:      Exercise Goals    Row Name 09/03/16 1517             Exercise Goals   Increase Physical Activity Yes       Intervention Provide advice, education, support and counseling about physical activity/exercise needs.;Develop an individualized exercise prescription for aerobic and resistive training based on initial evaluation findings, risk stratification, comorbidities and participant's personal goals.       Expected Outcomes Achievement of increased cardiorespiratory fitness and enhanced flexibility, muscular endurance and strength shown through measurements of functional capacity  and personal  statement of participant.       Increase Strength and Stamina Yes       Intervention Provide advice, education, support and counseling about physical activity/exercise needs.;Develop an individualized exercise prescription for aerobic and resistive training based on initial evaluation findings, risk stratification, comorbidities and participant's personal goals.       Expected Outcomes Achievement of increased cardiorespiratory fitness and enhanced flexibility, muscular endurance and strength shown through measurements of functional capacity and personal statement of participant.          Exercise Goals Re-Evaluation :    Discharge Exercise Prescription (Final Exercise Prescription Changes):   Nutrition:  Target Goals: Understanding of nutrition guidelines, daily intake of sodium 1500mg , cholesterol 200mg , calories 30% from fat and 7% or less from saturated fats, daily to have 5 or more servings of fruits and vegetables.  Biometrics:     Pre Biometrics - 09/03/16 1505      Pre Biometrics   Height 6' (1.829 m)   Weight 194 lb 7.1 oz (88.2 kg)   Waist Circumference 39.5 inches   Hip Circumference 41 inches   Waist to Hip Ratio 0.96 %   BMI (Calculated) 26.4   Triceps Skinfold 13 mm   % Body Fat 25.8 %   Grip Strength 77.4 kg   Flexibility 0 in   Single Leg Stand 3 seconds       Nutrition Therapy Plan and Nutrition Goals:   Nutrition Discharge: Rate Your Plate Scores:     Nutrition Assessments - 09/03/16 1517      MEDFICTS Scores   Pre Score 33      Nutrition Goals Re-Evaluation:   Nutrition Goals Discharge (Final Nutrition Goals Re-Evaluation):   Psychosocial: Target Goals: Acknowledge presence or absence of significant depression and/or stress, maximize coping skills, provide positive support system. Participant is able to verbalize types and ability to use techniques and skills needed for reducing stress and depression.  Initial Review & Psychosocial  Screening:     Initial Psych Review & Screening - 09/03/16 1520      Initial Review   Current issues with None Identified     Family Dynamics   Good Support System? Yes     Barriers   Psychosocial barriers to participate in program Psychosocial barriers identified (see note)  Patient overall QOL is 14.29. He states he is not depressed.      Screening Interventions   Interventions Encouraged to exercise      Quality of Life Scores:     Quality of Life - 09/03/16 1506      Quality of Life Scores   Health/Function Pre 12.13 %   Socioeconomic Pre 16.56 %   Psych/Spiritual Pre 12.79 %   Family Pre 19.2 %   GLOBAL Pre 14.29 %      PHQ-9: Recent Review Flowsheet Data    Depression screen [facility] 2/9 09/03/2016 08/05/2016 08/05/2016   Decreased Interest 1 0 0   Down, Depressed, Hopeless 0 0 0   PHQ - 2 Score 1 0 0   Altered sleeping 1 - -   Tired, decreased energy 1 - -   Change in appetite 0 - -   Feeling bad or failure about yourself  0 - -   Trouble concentrating 0 - -   Moving slowly or fidgety/restless 0 - -   Suicidal thoughts 0 - -   PHQ-9 Score 3 - -   Difficult doing work/chores Not difficult at all - -  Interpretation of Total Score  Total Score Depression Severity:  1-4 = Minimal depression, 5-9 = Mild depression, 10-14 = Moderate depression, 15-19 = Moderately severe depression, 20-27 = Severe depression   Psychosocial Evaluation and Intervention:     Psychosocial Evaluation - 09/03/16 1522      Psychosocial Evaluation & Interventions   Interventions Encouraged to exercise with the program and follow exercise prescription   Continue Psychosocial Services  Follow up required by staff      Psychosocial Re-Evaluation:   Psychosocial Discharge (Final Psychosocial Re-Evaluation):   Vocational Rehabilitation: Provide vocational rehab assistance to qualifying candidates.   Vocational Rehab Evaluation & Intervention:     Vocational Rehab -  09/03/16 1513      Initial Vocational Rehab Evaluation & Intervention   Assessment shows need for Vocational Rehabilitation No      Education: Education Goals: Education classes will be provided on a weekly basis, covering required topics. Participant will state understanding/return demonstration of topics presented.  Learning Barriers/Preferences:     Learning Barriers/Preferences - 09/03/16 1512      Learning Barriers/Preferences   Learning Barriers None   Learning Preferences Group Instruction;Individual Instruction;Skilled Demonstration      Education Topics: Hypertension, Hypertension Reduction -Define heart disease and high blood pressure. Discus how high blood pressure affects the body and ways to reduce high blood pressure.   Exercise and Your Heart -Discuss why it is important to exercise, the FITT principles of exercise, normal and abnormal responses to exercise, and how to exercise safely.   Angina -Discuss definition of angina, causes of angina, treatment of angina, and how to decrease risk of having angina.   Cardiac Medications -Review what the following cardiac medications are used for, how they affect the body, and side effects that may occur when taking the medications.  Medications include Aspirin, Beta blockers, calcium channel blockers, ACE Inhibitors, angiotensin receptor blockers, diuretics, digoxin, and antihyperlipidemics.   Congestive Heart Failure -Discuss the definition of CHF, how to live with CHF, the signs and symptoms of CHF, and how keep track of weight and sodium intake.   Heart Disease and Intimacy -Discus the effect sexual activity has on the heart, how changes occur during intimacy as we age, and safety during sexual activity.   Smoking Cessation / COPD -Discuss different methods to quit smoking, the health benefits of quitting smoking, and the definition of COPD.   Nutrition I: Fats -Discuss the types of cholesterol, what  cholesterol does to the heart, and how cholesterol levels can be controlled.   Nutrition II: Labels -Discuss the different components of food labels and how to read food label   Heart Parts and Heart Disease -Discuss the anatomy of the heart, the pathway of blood circulation through the heart, and these are affected by heart disease.   Stress I: Signs and Symptoms -Discuss the causes of stress, how stress may lead to anxiety and depression, and ways to limit stress.   Stress II: Relaxation -Discuss different types of relaxation techniques to limit stress.   Warning Signs of Stroke / TIA -Discuss definition of a stroke, what the signs and symptoms are of a stroke, and how to identify when someone is having stroke.   Knowledge Questionnaire Score:     Knowledge Questionnaire Score - 09/03/16 1513      Knowledge Questionnaire Score   Pre Score 26/28      Core Components/Risk Factors/Patient Goals at Admission:     Personal Goals and Risk Factors at Admission -  09/03/16 1517      Core Components/Risk Factors/Patient Goals on Admission    Weight Management Weight Maintenance   Tobacco Cessation --  has quite 03/29/2016   Personal Goal Other Yes   Personal Goal Be able to move around w/o legs getting tired.    Intervention Attend CR 3 x week and supplement at home 2 x week.    Expected Outcomes Achieve personal goals.       Core Components/Risk Factors/Patient Goals Review:      Goals and Risk Factor Review    Row Name 09/03/16 1520             Core Components/Risk Factors/Patient Goals Review   Personal Goals Review Weight Management/Obesity;Diabetes          Core Components/Risk Factors/Patient Goals at Discharge (Final Review):      Goals and Risk Factor Review - 09/03/16 1520      Core Components/Risk Factors/Patient Goals Review   Personal Goals Review Weight Management/Obesity;Diabetes      ITP Comments:     ITP Comments    Row Name 09/03/16  1514           ITP Comments Mr. [name] is a 66 year old male who has returned to work part-time for now. He only plans to attend for 1 month. His BP was elevated. We will monitor it and contact his cardiologist if continues to be elevated after coming for 1-2 weeks.           Comments: Patient arrived for 1st visit/orientation/education at 1300. Patient was referred to CR by Dr. Einar Gip due to CABGx4 (Z95.1).  During orientation advised patient on arrival and appointment times what to wear, what to do before, during and after exercise. Reviewed attendance and class policy. Talked about inclement weather and class consultation policy. Pt is scheduled to return Cardiac Rehab on 09/09/16 at 0930. Pt was advised to come to class 15 minutes before class starts. Patient was also given instructions on meeting with the dietician and attending the Family Structure classes. Pt is eager to get started. Patient only plans to attend for 1 month since he has returned to work. Patient participated in warm up stretches followed by light weights and resistance bands. Patient was able to complete 6 minute walk test. No S/S during test other than SOB. SOB was relived after 2 minute rest.Patient was measured for the equipment. Discussed equipment safety with patient. Took patient pre-anthropometric measurements. Patient finished visit at 1500.

## 2016-09-04 ENCOUNTER — Encounter: Payer: PPO | Attending: Cardiothoracic Surgery | Admitting: Nutrition

## 2016-09-04 VITALS — Wt 190.0 lb

## 2016-09-04 DIAGNOSIS — IMO0002 Reserved for concepts with insufficient information to code with codable children: Secondary | ICD-10-CM

## 2016-09-04 DIAGNOSIS — E1151 Type 2 diabetes mellitus with diabetic peripheral angiopathy without gangrene: Secondary | ICD-10-CM | POA: Diagnosis not present

## 2016-09-04 DIAGNOSIS — Z794 Long term (current) use of insulin: Principal | ICD-10-CM

## 2016-09-04 DIAGNOSIS — E118 Type 2 diabetes mellitus with unspecified complications: Principal | ICD-10-CM

## 2016-09-04 DIAGNOSIS — E1165 Type 2 diabetes mellitus with hyperglycemia: Secondary | ICD-10-CM

## 2016-09-04 NOTE — Patient Instructions (Signed)
Goals Keep up the great job!  1. Try to eat three balanced meals per day 2. Increase fresh fruits and vegetables 3. Try not to skip meals Prevent low blood sugars by not skipping meals. Take meds as prescribed.

## 2016-09-04 NOTE — Progress Notes (Signed)
  Medical Nutrition Therapy:  Appt start time: 2956  end time:  1115 Assessment:  Primary concerns today: Diabetes Type 2.  Follow up. He notes he starts cardiac rehab on Monday. Going to go back to work part time soon. Meter and BS log brought in. Taking  18 units of Levemir twice a day and 3 units of Novolog with meals when he eats. He is often skipping meals, either breakfast or lunch and therefore doesn't take his Novolog insulin.. No weight loss. BS are much better. BS avg 120's. Wt stable. Lost 1 lb.   Diet needs more meal consistency of balanced meals. He has refrained from smoking or drinking.     Sees MD in August and will get lab work done then. A1C should be much better. He may benefit from stopping meal time insulin and just take basal insulin.  Lab Results  Component Value Date   HGBA1C 12.2 (H) 06/22/2016   Lipid Panel     Component Value Date/Time   CHOL 97 06/22/2016 0234   TRIG 142 06/22/2016 0234   HDL 24 (L) 06/22/2016 0234   CHOLHDL 4.0 06/22/2016 0234   VLDL 28 06/22/2016 0234   LDLCALC 45 06/22/2016 0234       Preferred Learning Style:   No preference indicated   Learning Readiness:  Ready  Change in progress   MEDICATIONS: See list   DIETARY INTAKE:  B) Skips often or eats a bowl of Special K Cereal L) 1/2 of a sandwich, water Dinner-meat and some vegetables or whatever he feels like.   Usual physical activity: ADL  Estimated energy needs: 2000  calories 225 g carbohydrates 150 g protein 56 g fat  Progress Towards Goal(s):  In progress.   Nutritional Diagnosis:  NB-1.1 Food and nutrition-related knowledge deficit As related to Diabetes.  As evidenced by A1C 12.2%..    Intervention:  Nutrition and Diabetes education provided on My Plate, CHO counting, meal planning, portion sizes, timing of meals, avoiding snacks between meals unless having a low blood sugar, target ranges for A1C and blood sugars, signs/symptoms and treatment of  hyper/hypoglycemia, monitoring blood sugars, taking medications as prescribed, benefits of exercising 30 minutes per day and prevention of complications of DM.  Heart Healthy Diet, Low Fat High Fiber Goals Keep up the great job!  1. Try to eat three balanced meals per day 2. Increase fresh fruits and vegetables 3. Try not to skip meals Prevent low blood sugars by not skipping meals. Take meds as prescribed.   Keep up the great job!!!  Teaching Method Utilized:  Visual Auditory Hands on  Handouts given during visit include:  The Plate Method  Nutrition Therapy for CAGB and DM  Meal Plan Card    Barriers to learning/adherence to lifestyle change: none  Demonstrated degree of understanding via:  Teach Back   Monitoring/Evaluation:  Dietary intake, exercise, meal planning, SBG, and body weight in 3 month(s).  May benefit from stopping bolus insulin and just maintain basal insulin.

## 2016-09-09 ENCOUNTER — Encounter (HOSPITAL_COMMUNITY)
Admission: RE | Admit: 2016-09-09 | Discharge: 2016-09-09 | Disposition: A | Discharge status: Home / Self Care | Payer: PPO | Source: Ambulatory Visit | Attending: Cardiology | Admitting: Cardiology

## 2016-09-09 DIAGNOSIS — E1151 Type 2 diabetes mellitus with diabetic peripheral angiopathy without gangrene: Secondary | ICD-10-CM | POA: Diagnosis not present

## 2016-09-09 DIAGNOSIS — Z951 Presence of aortocoronary bypass graft: Secondary | ICD-10-CM

## 2016-09-09 NOTE — Progress Notes (Signed)
Cardiac Individual Treatment Plan  Patient Details  Name: Drew Harrison MRN: [id number] Date of Birth: 01-05-1951 Referring Provider:     CARDIAC REHAB PHASE II EXERCISE from 09/03/2016 in [place]  Referring Provider  Dr. Einar Gip      Initial Encounter Date:    CARDIAC REHAB PHASE II EXERCISE from 09/03/2016 in [place]  Date  09/03/16  Referring Provider  Dr. Einar Gip      Visit Diagnosis: S/P CABG x 4  Patient's Home Medications on Admission:  Current Outpatient Prescriptions:  .  allopurinol (ZYLOPRIM) 100 MG tablet, Take 1 tablet by mouth 2 (two) times daily. , Disp: , Rfl: 0 .  aspirin 81 MG tablet, Take 81 mg by mouth daily. , Disp: , Rfl:  .  atorvastatin (LIPITOR) 40 MG tablet, Take 1 tablet (40 mg total) by mouth daily at 6 PM., Disp: 30 tablet, Rfl: 0 .  clopidogrel (PLAVIX) 75 MG tablet, Take 75 mg by mouth daily., Disp: , Rfl:  .  famotidine (PEPCID) 40 MG tablet, Take 40 mg by mouth 2 (two) times daily., Disp: , Rfl:  .  insulin aspart (NOVOLOG) 100 UNIT/ML injection, Inject 3 Units into the skin 3 (three) times daily with meals., Disp: 10 mL, Rfl: 11 .  insulin detemir (LEVEMIR) 100 UNIT/ML injection, Inject 0.18 mLs (18 Units total) into the skin 2 (two) times daily., Disp: 10 mL, Rfl: 11 .  lisinopril (PRINIVIL,ZESTRIL) 10 MG tablet, Take 1 tablet (10 mg total) by mouth daily., Disp: , Rfl:  .  metFORMIN (GLUCOPHAGE) 500 MG tablet, Take 1 tablet (500 mg total) by mouth 2 (two) times daily with a meal., Disp: 60 tablet, Rfl: 0 .  metoprolol tartrate (LOPRESSOR) 25 MG tablet, Take 1 tablet (25 mg total) by mouth 2 (two) times daily., Disp: , Rfl:   Past Medical History: Past Medical History:  Diagnosis Date  . Abnormal nuclear stress test   . Coronary artery disease    3-vessel  . Diabetes mellitus without complication ([place])   . GERD (gastroesophageal reflux disease)   . Gout   . Headache   . History of tobacco  abuse   . Hyperlipidemia   . Hypertension   . Ischemic stroke ([place])   . Stroke ([place]) 03/29/2016   left facial droop  . Stroke due to embolism ([place])   . Visual disturbance     Tobacco Use: History  Smoking Status  . Former Smoker  . Packs/day: 1.50  . Years: 48.00  . Types: Cigarettes  . Quit date: 03/29/2016  Smokeless Tobacco  . Never Used    Labs: Recent Review Flowsheet Data    Labs for ITP Cardiac and Pulmonary Rehab Latest Ref Rng & Units 06/18/2016 06/19/2016 06/20/2016 06/21/2016 06/22/2016   Cholestrol 0 - 200 mg/dL - - - - 97   LDLCALC 0 - 99 mg/dL - - - - 45   HDL >40 mg/dL - - - - 24(L)   Trlycerides <150 mg/dL - - - - 142   Hemoglobin A1c 4.8 - 5.6 % - - - - 12.2(H)   PHART 7.350 - 7.450 - - - - -   PCO2ART 32.0 - 48.0 mmHg - - - - -   HCO3 20.0 - 28.0 mmol/L - - - - -   TCO2 0 - 100 mmol/L 25 25 - - -   ACIDBASEDEF 0.0 - 2.0 mmol/L - - - - -   O2SAT % - 54.5 58.3  55.6 12.6      Capillary Blood Glucose: Lab Results  Component Value Date   GLUCAP 117 (H) 06/26/2016   GLUCAP 86 06/26/2016   GLUCAP 196 (H) 06/25/2016   GLUCAP 82 06/25/2016   GLUCAP 185 (H) 06/25/2016     Exercise Target Goals:    Exercise Program Goal: Individual exercise prescription set with THRR, safety & activity barriers. Participant demonstrates ability to understand and report RPE using BORG scale, to self-measure pulse accurately, and to acknowledge the importance of the exercise prescription.  Exercise Prescription Goal: Starting with aerobic activity 30 plus minutes a day, 3 days per week for initial exercise prescription. Provide home exercise prescription and guidelines that participant acknowledges understanding prior to discharge.  Activity Barriers & Risk Stratification:   6 Minute Walk:     6 Minute Walk    Row Name 09/03/16 1507         6 Minute Walk   Phase Initial     Distance 1200 feet     Distance % Change 0 %     Walk Time 6 minutes     # of Rest  Breaks 0     MPH 2.27     METS 2.74     RPE 12     Perceived Dyspnea  16     VO2 Peak 12.23     Symptoms No     Resting HR 83 bpm     Resting BP 162/88     Max Ex. HR 106 bpm     Max Ex. BP 174/94     2 Minute Post BP 168/84        Oxygen Initial Assessment:   Oxygen Re-Evaluation:   Oxygen Discharge (Final Oxygen Re-Evaluation):   Initial Exercise Prescription:     Initial Exercise Prescription - 09/03/16 1500      Date of Initial Exercise RX and Referring Provider   Date 09/03/16   Referring Provider Dr. Einar Gip     NuStep   Level 2   SPM 9   Minutes 15   METs 1.7     Arm Ergometer   Level 2   Watts 22   RPM 22   Minutes 20   METs 2.5     Prescription Details   Frequency (times per week) 3   Duration Progress to 30 minutes of continuous aerobic without signs/symptoms of physical distress     Intensity   THRR 40-80% of Max Heartrate 111-125-140   Ratings of Perceived Exertion 11-13   Perceived Dyspnea 0-4     Progression   Progression Continue progressive overload as per policy without signs/symptoms or physical distress.     Resistance Training   Training Prescription Yes   Weight 1   Reps 10-15      Perform Capillary Blood Glucose checks as needed.  Exercise Prescription Changes:   Exercise Comments:      Exercise Comments    Row Name 09/05/16 1543           Exercise Comments Patient has just started CR and will be progressed in time.           Exercise Goals and Review:      Exercise Goals    Row Name 09/03/16 1517             Exercise Goals   Increase Physical Activity Yes       Intervention Provide advice, education, support and counseling about physical activity/exercise needs.;Develop an individualized  exercise prescription for aerobic and resistive training based on initial evaluation findings, risk stratification, comorbidities and participant's personal goals.       Expected Outcomes Achievement of increased  cardiorespiratory fitness and enhanced flexibility, muscular endurance and strength shown through measurements of functional capacity and personal statement of participant.       Increase Strength and Stamina Yes       Intervention Provide advice, education, support and counseling about physical activity/exercise needs.;Develop an individualized exercise prescription for aerobic and resistive training based on initial evaluation findings, risk stratification, comorbidities and participant's personal goals.       Expected Outcomes Achievement of increased cardiorespiratory fitness and enhanced flexibility, muscular endurance and strength shown through measurements of functional capacity and personal statement of participant.          Exercise Goals Re-Evaluation :    Discharge Exercise Prescription (Final Exercise Prescription Changes):   Nutrition:  Target Goals: Understanding of nutrition guidelines, daily intake of sodium 1500mg , cholesterol 200mg , calories 30% from fat and 7% or less from saturated fats, daily to have 5 or more servings of fruits and vegetables.  Biometrics:     Pre Biometrics - 09/03/16 1505      Pre Biometrics   Height 6' (1.829 m)   Weight 194 lb 7.1 oz (88.2 kg)   Waist Circumference 39.5 inches   Hip Circumference 41 inches   Waist to Hip Ratio 0.96 %   BMI (Calculated) 26.4   Triceps Skinfold 13 mm   % Body Fat 25.8 %   Grip Strength 77.4 kg   Flexibility 0 in   Single Leg Stand 3 seconds       Nutrition Therapy Plan and Nutrition Goals:   Nutrition Discharge: Rate Your Plate Scores:     Nutrition Assessments - 09/03/16 1517      MEDFICTS Scores   Pre Score 33      Nutrition Goals Re-Evaluation:   Nutrition Goals Discharge (Final Nutrition Goals Re-Evaluation):   Psychosocial: Target Goals: Acknowledge presence or absence of significant depression and/or stress, maximize coping skills, provide positive support system. Participant  is able to verbalize types and ability to use techniques and skills needed for reducing stress and depression.  Initial Review & Psychosocial Screening:     Initial Psych Review & Screening - 09/03/16 1520      Initial Review   Current issues with None Identified     Family Dynamics   Good Support System? Yes     Barriers   Psychosocial barriers to participate in program Psychosocial barriers identified (see note)  Patient overall QOL is 14.29. He states he is not depressed.      Screening Interventions   Interventions Encouraged to exercise      Quality of Life Scores:     Quality of Life - 09/03/16 1506      Quality of Life Scores   Health/Function Pre 12.13 %   Socioeconomic Pre 16.56 %   Psych/Spiritual Pre 12.79 %   Family Pre 19.2 %   GLOBAL Pre 14.29 %      PHQ-9: Recent Review Flowsheet Data    Depression screen [facility] 2/9 09/04/2016 09/03/2016 08/05/2016 08/05/2016   Decreased Interest 1 1 0 0   Down, Depressed, Hopeless 1 0 0 0   PHQ - 2 Score 2 1 0 0   Altered sleeping 1 1 - -   Tired, decreased energy 1 1 - -   Change in appetite 1 0 - -  Feeling bad or failure about yourself  0 0 - -   Trouble concentrating 0 0 - -   Moving slowly or fidgety/restless 0 0 - -   Suicidal thoughts 0 0 - -   PHQ-9 Score 5 3 - -   Difficult doing work/chores - Not difficult at all - -     Interpretation of Total Score  Total Score Depression Severity:  1-4 = Minimal depression, 5-9 = Mild depression, 10-14 = Moderate depression, 15-19 = Moderately severe depression, 20-27 = Severe depression   Psychosocial Evaluation and Intervention:     Psychosocial Evaluation - 09/03/16 1522      Psychosocial Evaluation & Interventions   Interventions Encouraged to exercise with the program and follow exercise prescription   Continue Psychosocial Services  Follow up required by staff      Psychosocial Re-Evaluation:   Psychosocial Discharge (Final Psychosocial  Re-Evaluation):   Vocational Rehabilitation: Provide vocational rehab assistance to qualifying candidates.   Vocational Rehab Evaluation & Intervention:     Vocational Rehab - 09/03/16 1513      Initial Vocational Rehab Evaluation & Intervention   Assessment shows need for Vocational Rehabilitation No      Education: Education Goals: Education classes will be provided on a weekly basis, covering required topics. Participant will state understanding/return demonstration of topics presented.  Learning Barriers/Preferences:     Learning Barriers/Preferences - 09/03/16 1512      Learning Barriers/Preferences   Learning Barriers None   Learning Preferences Group Instruction;Individual Instruction;Skilled Demonstration      Education Topics: Hypertension, Hypertension Reduction -Define heart disease and high blood pressure. Discus how high blood pressure affects the body and ways to reduce high blood pressure.   Exercise and Your Heart -Discuss why it is important to exercise, the FITT principles of exercise, normal and abnormal responses to exercise, and how to exercise safely.   Angina -Discuss definition of angina, causes of angina, treatment of angina, and how to decrease risk of having angina.   Cardiac Medications -Review what the following cardiac medications are used for, how they affect the body, and side effects that may occur when taking the medications.  Medications include Aspirin, Beta blockers, calcium channel blockers, ACE Inhibitors, angiotensin receptor blockers, diuretics, digoxin, and antihyperlipidemics.   Congestive Heart Failure -Discuss the definition of CHF, how to live with CHF, the signs and symptoms of CHF, and how keep track of weight and sodium intake.   Heart Disease and Intimacy -Discus the effect sexual activity has on the heart, how changes occur during intimacy as we age, and safety during sexual activity.   Smoking Cessation /  COPD -Discuss different methods to quit smoking, the health benefits of quitting smoking, and the definition of COPD.   Nutrition I: Fats -Discuss the types of cholesterol, what cholesterol does to the heart, and how cholesterol levels can be controlled.   Nutrition II: Labels -Discuss the different components of food labels and how to read food label   Heart Parts and Heart Disease -Discuss the anatomy of the heart, the pathway of blood circulation through the heart, and these are affected by heart disease.   Stress I: Signs and Symptoms -Discuss the causes of stress, how stress may lead to anxiety and depression, and ways to limit stress.   Stress II: Relaxation -Discuss different types of relaxation techniques to limit stress.   Warning Signs of Stroke / TIA -Discuss definition of a stroke, what the signs and symptoms are of  a stroke, and how to identify when someone is having stroke.   Knowledge Questionnaire Score:     Knowledge Questionnaire Score - 09/03/16 1513      Knowledge Questionnaire Score   Pre Score 26/28      Core Components/Risk Factors/Patient Goals at Admission:     Personal Goals and Risk Factors at Admission - 09/03/16 1517      Core Components/Risk Factors/Patient Goals on Admission    Weight Management Weight Maintenance   Tobacco Cessation --  has quite 03/29/2016   Personal Goal Other Yes   Personal Goal Be able to move around w/o legs getting tired.    Intervention Attend CR 3 x week and supplement at home 2 x week.    Expected Outcomes Achieve personal goals.       Core Components/Risk Factors/Patient Goals Review:      Goals and Risk Factor Review    Row Name 09/03/16 1520             Core Components/Risk Factors/Patient Goals Review   Personal Goals Review Weight Management/Obesity;Diabetes          Core Components/Risk Factors/Patient Goals at Discharge (Final Review):      Goals and Risk Factor Review - 09/03/16  1520      Core Components/Risk Factors/Patient Goals Review   Personal Goals Review Weight Management/Obesity;Diabetes      ITP Comments:     ITP Comments    Row Name 09/03/16 1514 09/09/16 1005         ITP Comments Mr. [name] is a 66 year old male who has returned to work part-time for now. He only plans to attend for 1 month. His BP was elevated. We will monitor it and contact his cardiologist if continues to be elevated after coming for 1-2 weeks.  Patient new to program. Started today 09/09/16. Will continue to monitor.          Comments: ITP 30 Day REVIEW Patient new to program. Started today 09/09/16. Will continue to monitor.

## 2016-09-09 NOTE — Progress Notes (Signed)
Daily Session Note  Patient Details  Name: Drew Harrison MRN: [id number] Date of Birth: 08/09/1950 Referring Provider:     CARDIAC REHAB PHASE II EXERCISE from 09/03/2016 in [place]  Referring Provider  Dr. Einar Gip      Encounter Date: 09/09/2016  Check In:     Session Check In - 09/09/16 0930      Check-In   Location [facility]   Staff Present Russella Dar, MS, EP, [facility], Exercise Physiologist;Mcdaniel Ohms Wynetta Emery, RN, BSN;Gregory Cowan, BS, EP, Exercise Physiologist   Supervising physician immediately available to respond to emergencies See telemetry face sheet for immediately available MD   Medication changes reported     No   Fall or balance concerns reported    No   Tobacco Cessation No Change   Warm-up and Cool-down Performed as group-led instruction   Resistance Training Performed Yes   VAD Patient? No     Pain Assessment   Currently in Pain? No/denies   Pain Score 0-No pain   Multiple Pain Sites No      Capillary Blood Glucose: No results found for this or any previous visit (from the past 24 hour(s)).    History  Smoking Status  . Former Smoker  . Packs/day: 1.50  . Years: 48.00  . Types: Cigarettes  . Quit date: 03/29/2016  Smokeless Tobacco  . Never Used    Goals Met:  Independence with exercise equipment Exercise tolerated well No report of cardiac concerns or symptoms Strength training completed today  Goals Unmet:  Not Applicable  Comments: Check out 1030.   Dr. Kate Sable is the Medical Director of [facility].

## 2016-09-11 ENCOUNTER — Encounter (HOSPITAL_COMMUNITY): Payer: PPO

## 2016-09-11 DIAGNOSIS — Z951 Presence of aortocoronary bypass graft: Secondary | ICD-10-CM | POA: Diagnosis not present

## 2016-09-11 DIAGNOSIS — I2510 Atherosclerotic heart disease of native coronary artery without angina pectoris: Secondary | ICD-10-CM | POA: Diagnosis not present

## 2016-09-11 DIAGNOSIS — I10 Essential (primary) hypertension: Secondary | ICD-10-CM | POA: Diagnosis not present

## 2016-09-11 DIAGNOSIS — R441 Visual hallucinations: Secondary | ICD-10-CM | POA: Diagnosis not present

## 2016-09-13 ENCOUNTER — Encounter (HOSPITAL_COMMUNITY): Payer: PPO

## 2016-09-16 ENCOUNTER — Encounter (HOSPITAL_COMMUNITY): Payer: PPO

## 2016-09-18 ENCOUNTER — Encounter (HOSPITAL_COMMUNITY): Payer: PPO

## 2016-09-20 ENCOUNTER — Encounter (HOSPITAL_COMMUNITY): Payer: PPO

## 2016-09-23 ENCOUNTER — Encounter (HOSPITAL_COMMUNITY): Payer: PPO

## 2016-09-25 ENCOUNTER — Encounter (HOSPITAL_COMMUNITY): Payer: PPO

## 2016-09-26 DIAGNOSIS — I10 Essential (primary) hypertension: Secondary | ICD-10-CM | POA: Diagnosis not present

## 2016-09-27 ENCOUNTER — Encounter (HOSPITAL_COMMUNITY)
Admission: RE | Admit: 2016-09-27 | Discharge: 2016-09-27 | Disposition: A | Discharge status: Home / Self Care | Payer: PPO | Source: Ambulatory Visit | Attending: Cardiology | Admitting: Cardiology

## 2016-09-27 DIAGNOSIS — Z8673 Personal history of transient ischemic attack (TIA), and cerebral infarction without residual deficits: Secondary | ICD-10-CM | POA: Insufficient documentation

## 2016-09-27 DIAGNOSIS — Z7982 Long term (current) use of aspirin: Secondary | ICD-10-CM | POA: Insufficient documentation

## 2016-09-27 DIAGNOSIS — E785 Hyperlipidemia, unspecified: Secondary | ICD-10-CM | POA: Insufficient documentation

## 2016-09-27 DIAGNOSIS — Z713 Dietary counseling and surveillance: Secondary | ICD-10-CM | POA: Insufficient documentation

## 2016-09-27 DIAGNOSIS — Z87891 Personal history of nicotine dependence: Secondary | ICD-10-CM | POA: Insufficient documentation

## 2016-09-27 DIAGNOSIS — Z79899 Other long term (current) drug therapy: Secondary | ICD-10-CM | POA: Insufficient documentation

## 2016-09-27 DIAGNOSIS — E1151 Type 2 diabetes mellitus with diabetic peripheral angiopathy without gangrene: Secondary | ICD-10-CM | POA: Insufficient documentation

## 2016-09-27 DIAGNOSIS — Z7984 Long term (current) use of oral hypoglycemic drugs: Secondary | ICD-10-CM | POA: Insufficient documentation

## 2016-09-27 DIAGNOSIS — Z951 Presence of aortocoronary bypass graft: Secondary | ICD-10-CM | POA: Insufficient documentation

## 2016-09-27 DIAGNOSIS — Z7902 Long term (current) use of antithrombotics/antiplatelets: Secondary | ICD-10-CM | POA: Insufficient documentation

## 2016-09-27 DIAGNOSIS — I10 Essential (primary) hypertension: Secondary | ICD-10-CM | POA: Insufficient documentation

## 2016-09-30 ENCOUNTER — Encounter (HOSPITAL_COMMUNITY): Payer: PPO

## 2016-09-30 NOTE — Progress Notes (Signed)
Cardiac Individual Treatment Plan  Patient Details  Name: Drew Harrison MRN: [id number] Date of Birth: 06/23/1950 Referring Provider:     CARDIAC REHAB PHASE II EXERCISE from 09/03/2016 in [place]  Referring Provider  Dr. Einar Gip      Initial Encounter Date:    CARDIAC REHAB PHASE II EXERCISE from 09/03/2016 in [place]  Date  09/03/16  Referring Provider  Dr. Einar Gip      Visit Diagnosis: S/P CABG x 4  Patient's Home Medications on Admission:  Current Outpatient Prescriptions:  .  allopurinol (ZYLOPRIM) 100 MG tablet, Take 1 tablet by mouth 2 (two) times daily. , Disp: , Rfl: 0 .  aspirin 81 MG tablet, Take 81 mg by mouth daily. , Disp: , Rfl:  .  atorvastatin (LIPITOR) 40 MG tablet, Take 1 tablet (40 mg total) by mouth daily at 6 PM., Disp: 30 tablet, Rfl: 0 .  clopidogrel (PLAVIX) 75 MG tablet, Take 75 mg by mouth daily., Disp: , Rfl:  .  famotidine (PEPCID) 40 MG tablet, Take 40 mg by mouth 2 (two) times daily., Disp: , Rfl:  .  insulin aspart (NOVOLOG) 100 UNIT/ML injection, Inject 3 Units into the skin 3 (three) times daily with meals., Disp: 10 mL, Rfl: 11 .  insulin detemir (LEVEMIR) 100 UNIT/ML injection, Inject 0.18 mLs (18 Units total) into the skin 2 (two) times daily., Disp: 10 mL, Rfl: 11 .  lisinopril (PRINIVIL,ZESTRIL) 10 MG tablet, Take 1 tablet (10 mg total) by mouth daily., Disp: , Rfl:  .  metFORMIN (GLUCOPHAGE) 500 MG tablet, Take 1 tablet (500 mg total) by mouth 2 (two) times daily with a meal., Disp: 60 tablet, Rfl: 0 .  metoprolol tartrate (LOPRESSOR) 25 MG tablet, Take 1 tablet (25 mg total) by mouth 2 (two) times daily., Disp: , Rfl:   Past Medical History: Past Medical History:  Diagnosis Date  . Abnormal nuclear stress test   . Coronary artery disease    3-vessel  . Diabetes mellitus without complication ([place])   . GERD (gastroesophageal reflux disease)   . Gout   . Headache   . History of tobacco  abuse   . Hyperlipidemia   . Hypertension   . Ischemic stroke ([place])   . Stroke ([place]) 03/29/2016   left facial droop  . Stroke due to embolism ([place])   . Visual disturbance     Tobacco Use: History  Smoking Status  . Former Smoker  . Packs/day: 1.50  . Years: 48.00  . Types: Cigarettes  . Quit date: 03/29/2016  Smokeless Tobacco  . Never Used    Labs: Recent Review Flowsheet Data    Labs for ITP Cardiac and Pulmonary Rehab Latest Ref Rng & Units 06/18/2016 06/19/2016 06/20/2016 06/21/2016 06/22/2016   Cholestrol 0 - 200 mg/dL - - - - 97   LDLCALC 0 - 99 mg/dL - - - - 45   HDL >40 mg/dL - - - - 24(L)   Trlycerides <150 mg/dL - - - - 142   Hemoglobin A1c 4.8 - 5.6 % - - - - 12.2(H)   PHART 7.350 - 7.450 - - - - -   PCO2ART 32.0 - 48.0 mmHg - - - - -   HCO3 20.0 - 28.0 mmol/L - - - - -   TCO2 0 - 100 mmol/L 25 25 - - -   ACIDBASEDEF 0.0 - 2.0 mmol/L - - - - -   O2SAT % - 54.5 58.3  55.6 12.6      Capillary Blood Glucose: Lab Results  Component Value Date   GLUCAP 117 (H) 06/26/2016   GLUCAP 86 06/26/2016   GLUCAP 196 (H) 06/25/2016   GLUCAP 82 06/25/2016   GLUCAP 185 (H) 06/25/2016     Exercise Target Goals:    Exercise Program Goal: Individual exercise prescription set with THRR, safety & activity barriers. Participant demonstrates ability to understand and report RPE using BORG scale, to self-measure pulse accurately, and to acknowledge the importance of the exercise prescription.  Exercise Prescription Goal: Starting with aerobic activity 30 plus minutes a day, 3 days per week for initial exercise prescription. Provide home exercise prescription and guidelines that participant acknowledges understanding prior to discharge.  Activity Barriers & Risk Stratification:   6 Minute Walk:     6 Minute Walk    Row Name 09/03/16 1507         6 Minute Walk   Phase Initial     Distance 1200 feet     Distance % Change 0 %     Walk Time 6 minutes     # of Rest  Breaks 0     MPH 2.27     METS 2.74     RPE 12     Perceived Dyspnea  16     VO2 Peak 12.23     Symptoms No     Resting HR 83 bpm     Resting BP 162/88     Max Ex. HR 106 bpm     Max Ex. BP 174/94     2 Minute Post BP 168/84        Oxygen Initial Assessment:   Oxygen Re-Evaluation:   Oxygen Discharge (Final Oxygen Re-Evaluation):   Initial Exercise Prescription:     Initial Exercise Prescription - 09/03/16 1500      Date of Initial Exercise RX and Referring Provider   Date 09/03/16   Referring Provider Dr. Einar Gip     NuStep   Level 2   SPM 9   Minutes 15   METs 1.7     Arm Ergometer   Level 2   Watts 22   RPM 22   Minutes 20   METs 2.5     Prescription Details   Frequency (times per week) 3   Duration Progress to 30 minutes of continuous aerobic without signs/symptoms of physical distress     Intensity   THRR 40-80% of Max Heartrate 111-125-140   Ratings of Perceived Exertion 11-13   Perceived Dyspnea 0-4     Progression   Progression Continue progressive overload as per policy without signs/symptoms or physical distress.     Resistance Training   Training Prescription Yes   Weight 1   Reps 10-15      Perform Capillary Blood Glucose checks as needed.  Exercise Prescription Changes:      Exercise Prescription Changes    Row Name 09/24/16 0800             Response to Exercise   Blood Pressure (Admit) 170/90       Blood Pressure (Exercise) 182/90       Blood Pressure (Exit) 170/80       Heart Rate (Admit) 85 bpm       Heart Rate (Exercise) 103 bpm       Heart Rate (Exit) 90 bpm       Rating of Perceived Exertion (Exercise) 10       Duration  Progress to 30 minutes of  aerobic without signs/symptoms of physical distress       Intensity THRR unchanged         Progression   Progression Continue to progress workloads to maintain intensity without signs/symptoms of physical distress.         Resistance Training   Training Prescription  Yes       Weight 1       Reps 10-15         NuStep   Level 2       SPM 11       Minutes 15       METs 3.59         Arm Ergometer   Level 2       Watts 4       RPM 22       Minutes 20       METs 2         Home Exercise Plan   Plans to continue exercise at Home (comment)       Frequency Add 2 additional days to program exercise sessions.          Exercise Comments:      Exercise Comments    Row Name 09/05/16 1543 09/24/16 0803         Exercise Comments Patient has just started CR and will be progressed in time.  Patient has not progressed due to lack of attendance.          Exercise Goals and Review:      Exercise Goals    Row Name 09/03/16 1517             Exercise Goals   Increase Physical Activity Yes       Intervention Provide advice, education, support and counseling about physical activity/exercise needs.;Develop an individualized exercise prescription for aerobic and resistive training based on initial evaluation findings, risk stratification, comorbidities and participant's personal goals.       Expected Outcomes Achievement of increased cardiorespiratory fitness and enhanced flexibility, muscular endurance and strength shown through measurements of functional capacity and personal statement of participant.       Increase Strength and Stamina Yes       Intervention Provide advice, education, support and counseling about physical activity/exercise needs.;Develop an individualized exercise prescription for aerobic and resistive training based on initial evaluation findings, risk stratification, comorbidities and participant's personal goals.       Expected Outcomes Achievement of increased cardiorespiratory fitness and enhanced flexibility, muscular endurance and strength shown through measurements of functional capacity and personal statement of participant.          Exercise Goals Re-Evaluation :    Discharge Exercise Prescription (Final Exercise  Prescription Changes):     Exercise Prescription Changes - 09/24/16 0800      Response to Exercise   Blood Pressure (Admit) 170/90   Blood Pressure (Exercise) 182/90   Blood Pressure (Exit) 170/80   Heart Rate (Admit) 85 bpm   Heart Rate (Exercise) 103 bpm   Heart Rate (Exit) 90 bpm   Rating of Perceived Exertion (Exercise) 10   Duration Progress to 30 minutes of  aerobic without signs/symptoms of physical distress   Intensity THRR unchanged     Progression   Progression Continue to progress workloads to maintain intensity without signs/symptoms of physical distress.     Resistance Training   Training Prescription Yes   Weight 1   Reps 10-15  NuStep   Level 2   SPM 11   Minutes 15   METs 3.59     Arm Ergometer   Level 2   Watts 4   RPM 22   Minutes 20   METs 2     Home Exercise Plan   Plans to continue exercise at Home (comment)   Frequency Add 2 additional days to program exercise sessions.      Nutrition:  Target Goals: Understanding of nutrition guidelines, daily intake of sodium 1500mg , cholesterol 200mg , calories 30% from fat and 7% or less from saturated fats, daily to have 5 or more servings of fruits and vegetables.  Biometrics:     Pre Biometrics - 09/03/16 1505      Pre Biometrics   Height 6' (1.829 m)   Weight 194 lb 7.1 oz (88.2 kg)   Waist Circumference 39.5 inches   Hip Circumference 41 inches   Waist to Hip Ratio 0.96 %   BMI (Calculated) 26.4   Triceps Skinfold 13 mm   % Body Fat 25.8 %   Grip Strength 77.4 kg   Flexibility 0 in   Single Leg Stand 3 seconds       Nutrition Therapy Plan and Nutrition Goals:   Nutrition Discharge: Rate Your Plate Scores:     Nutrition Assessments - 09/03/16 1517      MEDFICTS Scores   Pre Score 33      Nutrition Goals Re-Evaluation:   Nutrition Goals Discharge (Final Nutrition Goals Re-Evaluation):   Psychosocial: Target Goals: Acknowledge presence or absence of significant  depression and/or stress, maximize coping skills, provide positive support system. Participant is able to verbalize types and ability to use techniques and skills needed for reducing stress and depression.  Initial Review & Psychosocial Screening:     Initial Psych Review & Screening - 09/03/16 1520      Initial Review   Current issues with None Identified     Family Dynamics   Good Support System? Yes     Barriers   Psychosocial barriers to participate in program Psychosocial barriers identified (see note)  Patient overall QOL is 14.29. He states he is not depressed.      Screening Interventions   Interventions Encouraged to exercise      Quality of Life Scores:     Quality of Life - 09/03/16 1506      Quality of Life Scores   Health/Function Pre 12.13 %   Socioeconomic Pre 16.56 %   Psych/Spiritual Pre 12.79 %   Family Pre 19.2 %   GLOBAL Pre 14.29 %      PHQ-9: Recent Review Flowsheet Data    Depression screen [facility] 2/9 09/04/2016 09/03/2016 08/05/2016 08/05/2016   Decreased Interest 1 1 0 0   Down, Depressed, Hopeless 1 0 0 0   PHQ - 2 Score 2 1 0 0   Altered sleeping 1 1 - -   Tired, decreased energy 1 1 - -   Change in appetite 1 0 - -   Feeling bad or failure about yourself  0 0 - -   Trouble concentrating 0 0 - -   Moving slowly or fidgety/restless 0 0 - -   Suicidal thoughts 0 0 - -   PHQ-9 Score 5 3 - -   Difficult doing work/chores - Not difficult at all - -     Interpretation of Total Score  Total Score Depression Severity:  1-4 = Minimal depression, 5-9 = Mild depression,  10-14 = Moderate depression, 15-19 = Moderately severe depression, 20-27 = Severe depression   Psychosocial Evaluation and Intervention:     Psychosocial Evaluation - 09/03/16 1522      Psychosocial Evaluation & Interventions   Interventions Encouraged to exercise with the program and follow exercise prescription   Continue Psychosocial Services  Follow up required by staff       Psychosocial Re-Evaluation:   Psychosocial Discharge (Final Psychosocial Re-Evaluation):   Vocational Rehabilitation: Provide vocational rehab assistance to qualifying candidates.   Vocational Rehab Evaluation & Intervention:     Vocational Rehab - 09/03/16 1513      Initial Vocational Rehab Evaluation & Intervention   Assessment shows need for Vocational Rehabilitation No      Education: Education Goals: Education classes will be provided on a weekly basis, covering required topics. Participant will state understanding/return demonstration of topics presented.  Learning Barriers/Preferences:     Learning Barriers/Preferences - 09/03/16 1512      Learning Barriers/Preferences   Learning Barriers None   Learning Preferences Group Instruction;Individual Instruction;Skilled Demonstration      Education Topics: Hypertension, Hypertension Reduction -Define heart disease and high blood pressure. Discus how high blood pressure affects the body and ways to reduce high blood pressure.   Exercise and Your Heart -Discuss why it is important to exercise, the FITT principles of exercise, normal and abnormal responses to exercise, and how to exercise safely.   Angina -Discuss definition of angina, causes of angina, treatment of angina, and how to decrease risk of having angina.   Cardiac Medications -Review what the following cardiac medications are used for, how they affect the body, and side effects that may occur when taking the medications.  Medications include Aspirin, Beta blockers, calcium channel blockers, ACE Inhibitors, angiotensin receptor blockers, diuretics, digoxin, and antihyperlipidemics.   Congestive Heart Failure -Discuss the definition of CHF, how to live with CHF, the signs and symptoms of CHF, and how keep track of weight and sodium intake.   Heart Disease and Intimacy -Discus the effect sexual activity has on the heart, how changes occur during  intimacy as we age, and safety during sexual activity.   Smoking Cessation / COPD -Discuss different methods to quit smoking, the health benefits of quitting smoking, and the definition of COPD.   Nutrition I: Fats -Discuss the types of cholesterol, what cholesterol does to the heart, and how cholesterol levels can be controlled.   Nutrition II: Labels -Discuss the different components of food labels and how to read food label   Heart Parts and Heart Disease -Discuss the anatomy of the heart, the pathway of blood circulation through the heart, and these are affected by heart disease.   Stress I: Signs and Symptoms -Discuss the causes of stress, how stress may lead to anxiety and depression, and ways to limit stress.   Stress II: Relaxation -Discuss different types of relaxation techniques to limit stress.   Warning Signs of Stroke / TIA -Discuss definition of a stroke, what the signs and symptoms are of a stroke, and how to identify when someone is having stroke.   Knowledge Questionnaire Score:     Knowledge Questionnaire Score - 09/03/16 1513      Knowledge Questionnaire Score   Pre Score 26/28      Core Components/Risk Factors/Patient Goals at Admission:     Personal Goals and Risk Factors at Admission - 09/03/16 1517      Core Components/Risk Factors/Patient Goals on Admission    Weight  Management Weight Maintenance   Tobacco Cessation --  has quite 03/29/2016   Personal Goal Other Yes   Personal Goal Be able to move around w/o legs getting tired.    Intervention Attend CR 3 x week and supplement at home 2 x week.    Expected Outcomes Achieve personal goals.       Core Components/Risk Factors/Patient Goals Review:      Goals and Risk Factor Review    Row Name 09/03/16 1520             Core Components/Risk Factors/Patient Goals Review   Personal Goals Review Weight Management/Obesity;Diabetes          Core Components/Risk Factors/Patient Goals  at Discharge (Final Review):      Goals and Risk Factor Review - 09/03/16 1520      Core Components/Risk Factors/Patient Goals Review   Personal Goals Review Weight Management/Obesity;Diabetes      ITP Comments:     ITP Comments    Row Name 09/03/16 1514 09/09/16 1005 09/30/16 1248       ITP Comments Drew Harrison is a 66 year old male who has returned to work part-time for now. He only plans to attend for 1 month. His BP was elevated. We will monitor it and contact his cardiologist if continues to be elevated after coming for 1-2 weeks.  Patient new to program. Started today 09/09/16. Will continue to monitor.  Patient has completed 2 sessions. We contacted his cardiologist regarding his elevated b/p. MD changed his antihypertensive medication and told him not to come to CR until his b/p was controlled. Will continue to monitor for progress.         Comments: ITP 30 Day REVIEW Patient has completed 2 sessions. We contacted his cardiologist regarding his elevated b/p. MD changed his antihypertensive medication and told him not to come to CR until his b/p was controlled. Will continue to monitor for progress.

## 2016-10-02 ENCOUNTER — Encounter (HOSPITAL_COMMUNITY): Payer: PPO

## 2016-10-04 ENCOUNTER — Encounter (HOSPITAL_COMMUNITY): Payer: PPO

## 2016-10-07 ENCOUNTER — Encounter (HOSPITAL_COMMUNITY): Payer: PPO

## 2016-10-09 ENCOUNTER — Encounter (HOSPITAL_COMMUNITY): Payer: PPO

## 2016-10-09 DIAGNOSIS — I10 Essential (primary) hypertension: Secondary | ICD-10-CM | POA: Diagnosis not present

## 2016-10-11 ENCOUNTER — Encounter (HOSPITAL_COMMUNITY): Payer: PPO

## 2016-10-14 ENCOUNTER — Encounter (HOSPITAL_COMMUNITY): Payer: PPO

## 2016-10-16 ENCOUNTER — Encounter (HOSPITAL_COMMUNITY)
Admission: RE | Admit: 2016-10-16 | Discharge: 2016-10-16 | Disposition: A | Discharge status: Home / Self Care | Payer: PPO | Source: Ambulatory Visit | Attending: Cardiology | Admitting: Cardiology

## 2016-10-17 DIAGNOSIS — I6523 Occlusion and stenosis of bilateral carotid arteries: Secondary | ICD-10-CM | POA: Diagnosis not present

## 2016-10-18 ENCOUNTER — Encounter (HOSPITAL_COMMUNITY): Payer: PPO

## 2016-10-21 ENCOUNTER — Encounter (HOSPITAL_COMMUNITY): Payer: PPO

## 2016-10-23 ENCOUNTER — Encounter (HOSPITAL_COMMUNITY): Payer: PPO

## 2016-10-25 ENCOUNTER — Encounter (HOSPITAL_COMMUNITY): Payer: PPO

## 2016-10-25 DIAGNOSIS — E782 Mixed hyperlipidemia: Secondary | ICD-10-CM | POA: Diagnosis not present

## 2016-10-25 DIAGNOSIS — E119 Type 2 diabetes mellitus without complications: Secondary | ICD-10-CM | POA: Diagnosis not present

## 2016-10-25 DIAGNOSIS — I10 Essential (primary) hypertension: Secondary | ICD-10-CM | POA: Diagnosis not present

## 2016-10-25 NOTE — Progress Notes (Signed)
Discharge Summary  Patient Details  Name: Drew Harrison MRN: [id number] Date of Birth: June 25, 1950 Referring Provider:     CARDIAC REHAB PHASE II EXERCISE from 09/03/2016 in [place]  Referring Provider  Dr. Einar Gip       Number of Visits: 2  Reason for Discharge:  Early Exit:  Personal  Smoking History:  History  Smoking Status  . Former Smoker  . Packs/day: 1.50  . Years: 48.00  . Types: Cigarettes  . Quit date: 03/29/2016  Smokeless Tobacco  . Never Used    Diagnosis:  S/P CABG x 4  ADL UCSD:   Initial Exercise Prescription:     Initial Exercise Prescription - 09/03/16 1500      Date of Initial Exercise RX and Referring Provider   Date 09/03/16   Referring Provider Dr. Einar Gip     NuStep   Level 2   SPM 9   Minutes 15   METs 1.7     Arm Ergometer   Level 2   Watts 22   RPM 22   Minutes 20   METs 2.5     Prescription Details   Frequency (times per week) 3   Duration Progress to 30 minutes of continuous aerobic without signs/symptoms of physical distress     Intensity   THRR 40-80% of Max Heartrate 111-125-140   Ratings of Perceived Exertion 11-13   Perceived Dyspnea 0-4     Progression   Progression Continue progressive overload as per policy without signs/symptoms or physical distress.     Resistance Training   Training Prescription Yes   Weight 1   Reps 10-15      Discharge Exercise Prescription (Final Exercise Prescription Changes):     Exercise Prescription Changes - 09/24/16 0800      Response to Exercise   Blood Pressure (Admit) 170/90   Blood Pressure (Exercise) 182/90   Blood Pressure (Exit) 170/80   Heart Rate (Admit) 85 bpm   Heart Rate (Exercise) 103 bpm   Heart Rate (Exit) 90 bpm   Rating of Perceived Exertion (Exercise) 10   Duration Progress to 30 minutes of  aerobic without signs/symptoms of physical distress   Intensity THRR unchanged     Progression   Progression Continue to progress  workloads to maintain intensity without signs/symptoms of physical distress.     Resistance Training   Training Prescription Yes   Weight 1   Reps 10-15     NuStep   Level 2   SPM 11   Minutes 15   METs 3.59     Arm Ergometer   Level 2   Watts 4   RPM 22   Minutes 20   METs 2     Home Exercise Plan   Plans to continue exercise at Home (comment)   Frequency Add 2 additional days to program exercise sessions.      Functional Capacity:     6 Minute Walk    Row Name 09/03/16 1507         6 Minute Walk   Phase Initial     Distance 1200 feet     Distance % Change 0 %     Walk Time 6 minutes     # of Rest Breaks 0     MPH 2.27     METS 2.74     RPE 12     Perceived Dyspnea  16     VO2 Peak 12.23  Symptoms No     Resting HR 83 bpm     Resting BP 162/88     Max Ex. HR 106 bpm     Max Ex. BP 174/94     2 Minute Post BP 168/84        Psychological, QOL, Others - Outcomes: PHQ 2/9: Depression screen [facility] 2/9 09/04/2016 09/03/2016 08/05/2016 08/05/2016  Decreased Interest 1 1 0 0  Down, Depressed, Hopeless 1 0 0 0  PHQ - 2 Score 2 1 0 0  Altered sleeping 1 1 - -  Tired, decreased energy 1 1 - -  Change in appetite 1 0 - -  Feeling bad or failure about yourself  0 0 - -  Trouble concentrating 0 0 - -  Moving slowly or fidgety/restless 0 0 - -  Suicidal thoughts 0 0 - -  PHQ-9 Score 5 3 - -  Difficult doing work/chores - Not difficult at all - -    Quality of Life:     Quality of Life - 09/03/16 1506      Quality of Life Scores   Health/Function Pre 12.13 %   Socioeconomic Pre 16.56 %   Psych/Spiritual Pre 12.79 %   Family Pre 19.2 %   GLOBAL Pre 14.29 %      Personal Goals: Goals established at orientation with interventions provided to work toward goal.     Personal Goals and Risk Factors at Admission - 09/03/16 1517      Core Components/Risk Factors/Patient Goals on Admission    Weight Management Weight Maintenance   Tobacco Cessation --   has quite 03/29/2016   Personal Goal Other Yes   Personal Goal Be able to move around w/o legs getting tired.    Intervention Attend CR 3 x week and supplement at home 2 x week.    Expected Outcomes Achieve personal goals.        Personal Goals Discharge:     Goals and Risk Factor Review    Row Name 09/03/16 1520             Core Components/Risk Factors/Patient Goals Review   Personal Goals Review Weight Management/Obesity;Diabetes          Nutrition & Weight - Outcomes:     Pre Biometrics - 09/03/16 1505      Pre Biometrics   Height 6' (1.829 m)   Weight 194 lb 7.1 oz (88.2 kg)   Waist Circumference 39.5 inches   Hip Circumference 41 inches   Waist to Hip Ratio 0.96 %   BMI (Calculated) 26.4   Triceps Skinfold 13 mm   % Body Fat 25.8 %   Grip Strength 77.4 kg   Flexibility 0 in   Single Leg Stand 3 seconds       Nutrition:   Nutrition Discharge:     Nutrition Assessments - 09/03/16 1517      MEDFICTS Scores   Pre Score 33      Education Questionnaire Score:     Knowledge Questionnaire Score - 09/03/16 1513      Knowledge Questionnaire Score   Pre Score 26/28

## 2016-10-25 NOTE — Progress Notes (Signed)
Cardiac Individual Treatment Plan  Patient Details  Name: Drew Harrison MRN: [id number] Date of Birth: [birth year] Referring Provider:     CARDIAC REHAB PHASE II EXERCISE from 09/03/2016 in [place]  Referring Provider  Dr. Einar Gip      Initial Encounter Date:    CARDIAC REHAB PHASE II EXERCISE from 09/03/2016 in [place]  Date  09/03/16  Referring Provider  Dr. Einar Gip      Visit Diagnosis: S/P CABG x 4  Patient's Home Medications on Admission:  Current Outpatient Prescriptions:  .  allopurinol (ZYLOPRIM) 100 MG tablet, Take 1 tablet by mouth 2 (two) times daily. , Disp: , Rfl: 0 .  aspirin 81 MG tablet, Take 81 mg by mouth daily. , Disp: , Rfl:  .  atorvastatin (LIPITOR) 40 MG tablet, Take 1 tablet (40 mg total) by mouth daily at 6 PM., Disp: 30 tablet, Rfl: 0 .  clopidogrel (PLAVIX) 75 MG tablet, Take 75 mg by mouth daily., Disp: , Rfl:  .  famotidine (PEPCID) 40 MG tablet, Take 40 mg by mouth 2 (two) times daily., Disp: , Rfl:  .  insulin aspart (NOVOLOG) 100 UNIT/ML injection, Inject 3 Units into the skin 3 (three) times daily with meals., Disp: 10 mL, Rfl: 11 .  insulin detemir (LEVEMIR) 100 UNIT/ML injection, Inject 0.18 mLs (18 Units total) into the skin 2 (two) times daily., Disp: 10 mL, Rfl: 11 .  lisinopril (PRINIVIL,ZESTRIL) 10 MG tablet, Take 1 tablet (10 mg total) by mouth daily., Disp: , Rfl:  .  metFORMIN (GLUCOPHAGE) 500 MG tablet, Take 1 tablet (500 mg total) by mouth 2 (two) times daily with a meal., Disp: 60 tablet, Rfl: 0 .  metoprolol tartrate (LOPRESSOR) 25 MG tablet, Take 1 tablet (25 mg total) by mouth 2 (two) times daily., Disp: , Rfl:   Past Medical History: Past Medical History:  Diagnosis Date  . Abnormal nuclear stress test   . Coronary artery disease    3-vessel  . Diabetes mellitus without complication ([place])   . GERD (gastroesophageal reflux disease)   . Gout   . Headache   . History of tobacco  abuse   . Hyperlipidemia   . Hypertension   . Ischemic stroke ([place])   . Stroke ([place]) 03/29/2016   left facial droop  . Stroke due to embolism ([place])   . Visual disturbance     Tobacco Use: History  Smoking Status  . Former Smoker  . Packs/day: 1.50  . Years: 48.00  . Types: Cigarettes  . Quit date: 03/29/2016  Smokeless Tobacco  . Never Used    Labs: Recent Review Flowsheet Data    Labs for ITP Cardiac and Pulmonary Rehab Latest Ref Rng & Units 06/18/2016 06/19/2016 06/20/2016 06/21/2016 06/22/2016   Cholestrol 0 - 200 mg/dL - - - - 97   LDLCALC 0 - 99 mg/dL - - - - 45   HDL >40 mg/dL - - - - 24(L)   Trlycerides <150 mg/dL - - - - 142   Hemoglobin A1c 4.8 - 5.6 % - - - - 12.2(H)   PHART 7.350 - 7.450 - - - - -   PCO2ART 32.0 - 48.0 mmHg - - - - -   HCO3 20.0 - 28.0 mmol/L - - - - -   TCO2 0 - 100 mmol/L 25 25 - - -   ACIDBASEDEF 0.0 - 2.0 mmol/L - - - - -   O2SAT % - 54.5 58.3  55.6 12.6      Capillary Blood Glucose: Lab Results  Component Value Date   GLUCAP 117 (H) 06/26/2016   GLUCAP 86 06/26/2016   GLUCAP 196 (H) 06/25/2016   GLUCAP 82 06/25/2016   GLUCAP 185 (H) 06/25/2016     Exercise Target Goals:    Exercise Program Goal: Individual exercise prescription set with THRR, safety & activity barriers. Participant demonstrates ability to understand and report RPE using BORG scale, to self-measure pulse accurately, and to acknowledge the importance of the exercise prescription.  Exercise Prescription Goal: Starting with aerobic activity 30 plus minutes a day, 3 days per week for initial exercise prescription. Provide home exercise prescription and guidelines that participant acknowledges understanding prior to discharge.  Activity Barriers & Risk Stratification:   6 Minute Walk:     6 Minute Walk    Row Name 09/03/16 1507         6 Minute Walk   Phase Initial     Distance 1200 feet     Distance % Change 0 %     Walk Time 6 minutes     # of Rest  Breaks 0     MPH 2.27     METS 2.74     RPE 12     Perceived Dyspnea  16     VO2 Peak 12.23     Symptoms No     Resting HR 83 bpm     Resting BP 162/88     Max Ex. HR 106 bpm     Max Ex. BP 174/94     2 Minute Post BP 168/84        Oxygen Initial Assessment:   Oxygen Re-Evaluation:   Oxygen Discharge (Final Oxygen Re-Evaluation):   Initial Exercise Prescription:     Initial Exercise Prescription - 09/03/16 1500      Date of Initial Exercise RX and Referring Provider   Date 09/03/16   Referring Provider Dr. Einar Gip     NuStep   Level 2   SPM 9   Minutes 15   METs 1.7     Arm Ergometer   Level 2   Watts 22   RPM 22   Minutes 20   METs 2.5     Prescription Details   Frequency (times per week) 3   Duration Progress to 30 minutes of continuous aerobic without signs/symptoms of physical distress     Intensity   THRR 40-80% of Max Heartrate 111-125-140   Ratings of Perceived Exertion 11-13   Perceived Dyspnea 0-4     Progression   Progression Continue progressive overload as per policy without signs/symptoms or physical distress.     Resistance Training   Training Prescription Yes   Weight 1   Reps 10-15      Perform Capillary Blood Glucose checks as needed.  Exercise Prescription Changes:      Exercise Prescription Changes    Row Name 09/24/16 0800             Response to Exercise   Blood Pressure (Admit) 170/90       Blood Pressure (Exercise) 182/90       Blood Pressure (Exit) 170/80       Heart Rate (Admit) 85 bpm       Heart Rate (Exercise) 103 bpm       Heart Rate (Exit) 90 bpm       Rating of Perceived Exertion (Exercise) 10       Duration  Progress to 30 minutes of  aerobic without signs/symptoms of physical distress       Intensity THRR unchanged         Progression   Progression Continue to progress workloads to maintain intensity without signs/symptoms of physical distress.         Resistance Training   Training Prescription  Yes       Weight 1       Reps 10-15         NuStep   Level 2       SPM 11       Minutes 15       METs 3.59         Arm Ergometer   Level 2       Watts 4       RPM 22       Minutes 20       METs 2         Home Exercise Plan   Plans to continue exercise at Home (comment)       Frequency Add 2 additional days to program exercise sessions.          Exercise Comments:      Exercise Comments    Row Name 09/05/16 1543 09/24/16 0803 10/14/16 1428       Exercise Comments Patient has just started CR and will be progressed in time.  Patient has not progressed due to lack of attendance.  Patient will rejoin CR soon but has not been present for progression on account of BP issues.         Exercise Goals and Review:      Exercise Goals    Row Name 09/03/16 1517             Exercise Goals   Increase Physical Activity Yes       Intervention Provide advice, education, support and counseling about physical activity/exercise needs.;Develop an individualized exercise prescription for aerobic and resistive training based on initial evaluation findings, risk stratification, comorbidities and participant's personal goals.       Expected Outcomes Achievement of increased cardiorespiratory fitness and enhanced flexibility, muscular endurance and strength shown through measurements of functional capacity and personal statement of participant.       Increase Strength and Stamina Yes       Intervention Provide advice, education, support and counseling about physical activity/exercise needs.;Develop an individualized exercise prescription for aerobic and resistive training based on initial evaluation findings, risk stratification, comorbidities and participant's personal goals.       Expected Outcomes Achievement of increased cardiorespiratory fitness and enhanced flexibility, muscular endurance and strength shown through measurements of functional capacity and personal statement of  participant.          Exercise Goals Re-Evaluation :    Discharge Exercise Prescription (Final Exercise Prescription Changes):     Exercise Prescription Changes - 09/24/16 0800      Response to Exercise   Blood Pressure (Admit) 170/90   Blood Pressure (Exercise) 182/90   Blood Pressure (Exit) 170/80   Heart Rate (Admit) 85 bpm   Heart Rate (Exercise) 103 bpm   Heart Rate (Exit) 90 bpm   Rating of Perceived Exertion (Exercise) 10   Duration Progress to 30 minutes of  aerobic without signs/symptoms of physical distress   Intensity THRR unchanged     Progression   Progression Continue to progress workloads to maintain intensity without signs/symptoms of physical distress.     Resistance  Training   Training Prescription Yes   Weight 1   Reps 10-15     NuStep   Level 2   SPM 11   Minutes 15   METs 3.59     Arm Ergometer   Level 2   Watts 4   RPM 22   Minutes 20   METs 2     Home Exercise Plan   Plans to continue exercise at Home (comment)   Frequency Add 2 additional days to program exercise sessions.      Nutrition:  Target Goals: Understanding of nutrition guidelines, daily intake of sodium 1500mg , cholesterol 200mg , calories 30% from fat and 7% or less from saturated fats, daily to have 5 or more servings of fruits and vegetables.  Biometrics:     Pre Biometrics - 09/03/16 1505      Pre Biometrics   Height 6' (1.829 m)   Weight 194 lb 7.1 oz (88.2 kg)   Waist Circumference 39.5 inches   Hip Circumference 41 inches   Waist to Hip Ratio 0.96 %   BMI (Calculated) 26.4   Triceps Skinfold 13 mm   % Body Fat 25.8 %   Grip Strength 77.4 kg   Flexibility 0 in   Single Leg Stand 3 seconds       Nutrition Therapy Plan and Nutrition Goals:   Nutrition Discharge: Rate Your Plate Scores:     Nutrition Assessments - 09/03/16 1517      MEDFICTS Scores   Pre Score 33      Nutrition Goals Re-Evaluation:   Nutrition Goals Discharge (Final  Nutrition Goals Re-Evaluation):   Psychosocial: Target Goals: Acknowledge presence or absence of significant depression and/or stress, maximize coping skills, provide positive support system. Participant is able to verbalize types and ability to use techniques and skills needed for reducing stress and depression.  Initial Review & Psychosocial Screening:     Initial Psych Review & Screening - 09/03/16 1520      Initial Review   Current issues with None Identified     Family Dynamics   Good Support System? Yes     Barriers   Psychosocial barriers to participate in program Psychosocial barriers identified (see note)  Patient overall QOL is 14.29. He states he is not depressed.      Screening Interventions   Interventions Encouraged to exercise      Quality of Life Scores:     Quality of Life - 09/03/16 1506      Quality of Life Scores   Health/Function Pre 12.13 %   Socioeconomic Pre 16.56 %   Psych/Spiritual Pre 12.79 %   Family Pre 19.2 %   GLOBAL Pre 14.29 %      PHQ-9: Recent Review Flowsheet Data    Depression screen [facility] 2/9 09/04/2016 09/03/2016 08/05/2016 08/05/2016   Decreased Interest 1 1 0 0   Down, Depressed, Hopeless 1 0 0 0   PHQ - 2 Score 2 1 0 0   Altered sleeping 1 1 - -   Tired, decreased energy 1 1 - -   Change in appetite 1 0 - -   Feeling bad or failure about yourself  0 0 - -   Trouble concentrating 0 0 - -   Moving slowly or fidgety/restless 0 0 - -   Suicidal thoughts 0 0 - -   PHQ-9 Score 5 3 - -   Difficult doing work/chores - Not difficult at all - -  Interpretation of Total Score  Total Score Depression Severity:  1-4 = Minimal depression, 5-9 = Mild depression, 10-14 = Moderate depression, 15-19 = Moderately severe depression, 20-27 = Severe depression   Psychosocial Evaluation and Intervention:     Psychosocial Evaluation - 09/03/16 1522      Psychosocial Evaluation & Interventions   Interventions Encouraged to exercise with  the program and follow exercise prescription   Continue Psychosocial Services  Follow up required by staff      Psychosocial Re-Evaluation:   Psychosocial Discharge (Final Psychosocial Re-Evaluation):   Vocational Rehabilitation: Provide vocational rehab assistance to qualifying candidates.   Vocational Rehab Evaluation & Intervention:     Vocational Rehab - 09/03/16 1513      Initial Vocational Rehab Evaluation & Intervention   Assessment shows need for Vocational Rehabilitation No      Education: Education Goals: Education classes will be provided on a weekly basis, covering required topics. Participant will state understanding/return demonstration of topics presented.  Learning Barriers/Preferences:     Learning Barriers/Preferences - 09/03/16 1512      Learning Barriers/Preferences   Learning Barriers None   Learning Preferences Group Instruction;Individual Instruction;Skilled Demonstration      Education Topics: Hypertension, Hypertension Reduction -Define heart disease and high blood pressure. Discus how high blood pressure affects the body and ways to reduce high blood pressure.   Exercise and Your Heart -Discuss why it is important to exercise, the FITT principles of exercise, normal and abnormal responses to exercise, and how to exercise safely.   Angina -Discuss definition of angina, causes of angina, treatment of angina, and how to decrease risk of having angina.   Cardiac Medications -Review what the following cardiac medications are used for, how they affect the body, and side effects that may occur when taking the medications.  Medications include Aspirin, Beta blockers, calcium channel blockers, ACE Inhibitors, angiotensin receptor blockers, diuretics, digoxin, and antihyperlipidemics.   Congestive Heart Failure -Discuss the definition of CHF, how to live with CHF, the signs and symptoms of CHF, and how keep track of weight and sodium  intake.   Heart Disease and Intimacy -Discus the effect sexual activity has on the heart, how changes occur during intimacy as we age, and safety during sexual activity.   Smoking Cessation / COPD -Discuss different methods to quit smoking, the health benefits of quitting smoking, and the definition of COPD.   Nutrition I: Fats -Discuss the types of cholesterol, what cholesterol does to the heart, and how cholesterol levels can be controlled.   Nutrition II: Labels -Discuss the different components of food labels and how to read food label   Heart Parts and Heart Disease -Discuss the anatomy of the heart, the pathway of blood circulation through the heart, and these are affected by heart disease.   Stress I: Signs and Symptoms -Discuss the causes of stress, how stress may lead to anxiety and depression, and ways to limit stress.   Stress II: Relaxation -Discuss different types of relaxation techniques to limit stress.   Warning Signs of Stroke / TIA -Discuss definition of a stroke, what the signs and symptoms are of a stroke, and how to identify when someone is having stroke.   Knowledge Questionnaire Score:     Knowledge Questionnaire Score - 09/03/16 1513      Knowledge Questionnaire Score   Pre Score 26/28      Core Components/Risk Factors/Patient Goals at Admission:     Personal Goals and Risk Factors at Admission -  09/03/16 1517      Core Components/Risk Factors/Patient Goals on Admission    Weight Management Weight Maintenance   Tobacco Cessation --  has quite 03/29/2016   Personal Goal Other Yes   Personal Goal Be able to move around w/o legs getting tired.    Intervention Attend CR 3 x week and supplement at home 2 x week.    Expected Outcomes Achieve personal goals.       Core Components/Risk Factors/Patient Goals Review:      Goals and Risk Factor Review    Row Name 09/03/16 1520             Core Components/Risk Factors/Patient Goals  Review   Personal Goals Review Weight Management/Obesity;Diabetes          Core Components/Risk Factors/Patient Goals at Discharge (Final Review):      Goals and Risk Factor Review - 09/03/16 1520      Core Components/Risk Factors/Patient Goals Review   Personal Goals Review Weight Management/Obesity;Diabetes      ITP Comments:     ITP Comments    Row Name 09/03/16 1514 09/09/16 1005 09/30/16 1248 10/25/16 1002     ITP Comments Mr. [name] is a 66 year old male who has returned to work part-time for now. He only plans to attend for 1 month. His BP was elevated. We will monitor it and contact his cardiologist if continues to be elevated after coming for 1-2 weeks.  Patient new to program. Started today 09/09/16. Will continue to monitor.  Patient has completed 2 sessions. We contacted his cardiologist regarding his elevated b/p. MD changed his antihypertensive medication and told him not to come to CR until his b/p was controlled. Will continue to monitor for progress.  Patient stopped coming after completing 2 sessions d/t hypertension. Doctor will be notified.        Comments: Patient stopped coming to Cardiac Rehab on 09/09/2016 after completing 2 sessions d/t hypertension. Called patient to remind them of the Cardiac Rehab policy that if they do not call or come for 3 consecutive visits that they would be discharged from the CR program. Doctor will be informed.

## 2016-10-25 NOTE — Addendum Note (Signed)
Encounter addended by: Dwana Melena, RN on: 10/25/2016 10:06 AM<BR>    Actions taken: Visit Navigator Flowsheet section accepted, Sign clinical note, Episode resolved

## 2016-10-28 ENCOUNTER — Encounter (HOSPITAL_COMMUNITY): Payer: PPO

## 2016-10-30 ENCOUNTER — Encounter (HOSPITAL_COMMUNITY): Payer: PPO

## 2016-11-01 ENCOUNTER — Encounter (HOSPITAL_COMMUNITY): Payer: PPO

## 2016-11-04 ENCOUNTER — Encounter (HOSPITAL_COMMUNITY): Payer: PPO

## 2016-11-05 DIAGNOSIS — E119 Type 2 diabetes mellitus without complications: Secondary | ICD-10-CM | POA: Diagnosis not present

## 2016-11-05 DIAGNOSIS — S80821A Blister (nonthermal), right lower leg, initial encounter: Secondary | ICD-10-CM | POA: Diagnosis not present

## 2016-11-05 DIAGNOSIS — I10 Essential (primary) hypertension: Secondary | ICD-10-CM | POA: Diagnosis not present

## 2016-11-05 DIAGNOSIS — I639 Cerebral infarction, unspecified: Secondary | ICD-10-CM | POA: Diagnosis not present

## 2016-11-05 DIAGNOSIS — E782 Mixed hyperlipidemia: Secondary | ICD-10-CM | POA: Diagnosis not present

## 2016-11-05 DIAGNOSIS — I7090 Unspecified atherosclerosis: Secondary | ICD-10-CM | POA: Diagnosis not present

## 2016-11-06 ENCOUNTER — Encounter (HOSPITAL_COMMUNITY): Payer: PPO

## 2016-11-07 ENCOUNTER — Other Ambulatory Visit: Payer: Self-pay

## 2016-11-07 DIAGNOSIS — I739 Peripheral vascular disease, unspecified: Secondary | ICD-10-CM

## 2016-11-08 ENCOUNTER — Encounter (HOSPITAL_COMMUNITY): Payer: PPO

## 2016-11-11 ENCOUNTER — Encounter (HOSPITAL_COMMUNITY): Payer: PPO

## 2016-11-11 DIAGNOSIS — I10 Essential (primary) hypertension: Secondary | ICD-10-CM | POA: Diagnosis not present

## 2016-11-11 DIAGNOSIS — I6521 Occlusion and stenosis of right carotid artery: Secondary | ICD-10-CM | POA: Diagnosis not present

## 2016-11-11 DIAGNOSIS — I739 Peripheral vascular disease, unspecified: Secondary | ICD-10-CM | POA: Diagnosis not present

## 2016-11-11 DIAGNOSIS — I2510 Atherosclerotic heart disease of native coronary artery without angina pectoris: Secondary | ICD-10-CM | POA: Diagnosis not present

## 2016-11-13 ENCOUNTER — Encounter (HOSPITAL_COMMUNITY): Payer: PPO

## 2016-11-15 ENCOUNTER — Encounter (HOSPITAL_COMMUNITY): Payer: PPO

## 2016-11-18 ENCOUNTER — Encounter (HOSPITAL_COMMUNITY): Payer: PPO

## 2016-11-18 DIAGNOSIS — K219 Gastro-esophageal reflux disease without esophagitis: Secondary | ICD-10-CM | POA: Diagnosis not present

## 2016-11-18 DIAGNOSIS — I6789 Other cerebrovascular disease: Secondary | ICD-10-CM | POA: Diagnosis not present

## 2016-11-18 DIAGNOSIS — Z1211 Encounter for screening for malignant neoplasm of colon: Secondary | ICD-10-CM | POA: Diagnosis not present

## 2016-11-18 DIAGNOSIS — I2510 Atherosclerotic heart disease of native coronary artery without angina pectoris: Secondary | ICD-10-CM | POA: Diagnosis not present

## 2016-11-20 ENCOUNTER — Encounter (HOSPITAL_COMMUNITY): Payer: PPO

## 2016-11-22 ENCOUNTER — Encounter (HOSPITAL_COMMUNITY): Payer: PPO

## 2016-11-25 ENCOUNTER — Encounter (HOSPITAL_COMMUNITY): Payer: PPO

## 2016-11-27 ENCOUNTER — Encounter (HOSPITAL_COMMUNITY): Payer: PPO

## 2016-11-29 ENCOUNTER — Encounter (HOSPITAL_COMMUNITY): Payer: PPO

## 2016-12-10 ENCOUNTER — Encounter: Payer: Self-pay | Admitting: Gastroenterology

## 2016-12-11 ENCOUNTER — Ambulatory Visit: Payer: PPO | Admitting: Nutrition

## 2016-12-13 ENCOUNTER — Encounter (HOSPITAL_COMMUNITY): Payer: PPO

## 2016-12-13 ENCOUNTER — Encounter: Payer: PPO | Admitting: Vascular Surgery

## 2016-12-16 NOTE — Addendum Note (Signed)
Addendum  created 12/16/16 1255 by Roderic Palau, MD   Sign clinical note

## 2016-12-18 DIAGNOSIS — I739 Peripheral vascular disease, unspecified: Secondary | ICD-10-CM | POA: Diagnosis not present

## 2016-12-26 DIAGNOSIS — I739 Peripheral vascular disease, unspecified: Secondary | ICD-10-CM | POA: Diagnosis not present

## 2016-12-26 DIAGNOSIS — I10 Essential (primary) hypertension: Secondary | ICD-10-CM | POA: Diagnosis not present

## 2017-01-02 ENCOUNTER — Encounter: Payer: PPO | Attending: Cardiology | Admitting: Nutrition

## 2017-01-02 DIAGNOSIS — E119 Type 2 diabetes mellitus without complications: Secondary | ICD-10-CM | POA: Diagnosis not present

## 2017-01-02 DIAGNOSIS — Z713 Dietary counseling and surveillance: Secondary | ICD-10-CM | POA: Insufficient documentation

## 2017-01-02 DIAGNOSIS — E669 Obesity, unspecified: Secondary | ICD-10-CM

## 2017-01-02 NOTE — Patient Instructions (Addendum)
Goals 1. Eat three balanced meals per day 2. Drink water 3. Try taking Levemir at night instead of morning to get FBS lower. 4. Increase fresh fruits and vegetables Don't skip meals

## 2017-01-02 NOTE — Progress Notes (Signed)
  Medical Nutrition Therapy:  Appt start time: 900 end time:  915 Assessment:  Primary concerns today: Diabetes Type 2.  Follow up. Meter and BS log brought in. Taking 18 units of Levemir once a day and no longer taking Novolog. He is often skipping meals, No weight loss. Bs avg 165 mg/dl. Taking BS only in am. Takes Levemir in am and suggested he take it in evening for improvement in FBS.   Diet needs more meal consistency of balanced meals. Drinks 2-3 beers at night.. Requested labs from his PCP. They took him off meal time insulin apparently. Is scheduled  For Vascular surgery scheduled at end of month for blood pulling in his right leg near his knee.     Labs from PCP show: A1C 6.1%  10/25/16  Glu 237 mg/ld. TCHOL 154 mg/dl TG 75 mg/dl HDL 49 mg/dl LDL 90 mg/dl. Cret  1.08 mg/dl.   Making great progress but  Needs to eat three balanced meals per day. Still working part time at [employer]  Preferred Learning Style:   No preference indicated   Learning Readiness:  Ready  Change in progress   MEDICATIONS: See list   DIETARY INTAKE:  B) Skips often or eats a bowl of Special K Cereal L) 1/2 of a sandwich, water Dinner-meat and some vegetables or whatever he feels like.   Usual physical activity: ADL  Estimated energy needs: 2000  calories 225 g carbohydrates 150 g protein 56 g fat  Progress Towards Goal(s):  In progress.   Nutritional Diagnosis:  NB-1.1 Food and nutrition-related knowledge deficit As related to Diabetes.  As evidenced by A1C 12.2%..    Intervention:  Reviewed benefits of healthy eating of fresh fruits, vegetables and whole grains to improve heart function and healing from surgeries and reducing complications from DM.  Heart Healthy Diet, Low Fat High Fiber    Goals Keep up the great job!  1. Try to eat three balanced meals per day 2. Increase fresh fruits and vegetables 3. Try not to skip meals Prevent low blood sugars by not skipping meals. Take  meds as prescribed.   Keep up the great job!!!  Teaching Method Utilized:  Visual Auditory Hands on  Handouts given during visit include:  The Plate Method  Nutrition Therapy for CAGB and DM  Meal Plan Card    Barriers to learning/adherence to lifestyle change: none  Demonstrated degree of understanding via:  Teach Back   Monitoring/Evaluation:  Dietary intake, exercise, meal planning, SBG, and body weight in 3 month(s).  Suggested he try taking Levemir at night instead of am for better blood sugar control in am.

## 2017-01-16 DIAGNOSIS — I2510 Atherosclerotic heart disease of native coronary artery without angina pectoris: Secondary | ICD-10-CM | POA: Diagnosis not present

## 2017-01-19 DIAGNOSIS — I739 Peripheral vascular disease, unspecified: Secondary | ICD-10-CM | POA: Diagnosis present

## 2017-01-19 NOTE — H&P (Signed)
OFFICE VISIT NOTES COPIED TO EPIC FOR DOCUMENTATION  . History of Present Illness Gwinda Maine FNP-C; 12/26/2016 9:46 AM) Patient words: F/u claudication, pad, le duplex; Last office visit 11/11/16.  The patient is a 66 year old male who presents for a Follow-up for Leg claudication.  Additional reasons for visit:  Follow-up for Carotid artery stenosis   Follow-up for Coronary artery disease is described as the following: Caucasian male patient. He has history of ischemic stroke on 03/29/2016, due to abnormal stress test underwent coronary angiography on 05/16/2016 which revealed severe triple-vessel disease. He underwent CABG 4 on 06/17/2016 but had postoperative stroke and now has lateral visual field defect in bilateral eyes and has been corrected by glasses.  His past medical history significant for hypertension, diabetes mellitus, tobacco use disorder. He has approximately a 60 pack year history of smoking. 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  Patient presents for follow-up for PAD. 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 There was moderately decreased perfusion in the lower extremity with our ABI of 0.7 and left ABI of 0.6. Patient continues report leg pain, fatigue and swelling. He does work in Scientist, research (medical) job and states that the pain in his legs is made his job very difficult. Previous superficial scab has now healed and he has completed Flex regimen. No other new complaints today.   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 RCA ocluded. LAD 3 tandem prox to mid aneurysmal dilatation with 90% stenosis in the mid LAD. Smooth after stenosis. D1 prox 80%. Cx Occluded mid. RI-2 prox to mid 80%. Echocardiogram 03/30/2016: Normal LV systolic function EF 09-60%. Probable severe hypokinesis and scarring in the basal inferolateral and inferior myocardium. Consider CAD. Grade 2 diastolic dysfunction. Mild left atrial enlargement. Lexiscan myoview stress test 04/08/2016: 1. Resting EKG 1 normal sinus rhythm, normal axis. Inferior infarct old, cannot exclude anterior infarct old. LVH, nonspecific T abnormality, cannot exclude inferior and lateral ischemia. Stress EKG is non-diagnostic for ischemia as it a pharmacologic stress using Lexiscan. 2. There is transient ischemic dilatationof the LV with left ventricular end-diastolic volume of 454 mL in stress images. There is a very large size severe defect in the inferior, inferolateral wall extending from the base towards the apex with mild to moderate amount of pre-infarct ischemia. Left ventricular systolic function calculated by QGS was markedly depressed at 34% with generalized hypokinesis and inferior and lateral akinesis.This is a high risk study, consider further cardiac work-up. Labwork  Labs 06/22/2016: Total cholesterol 97, triglycerides 142, HDL 24, LDL 45. A1c 4.2%. Serum glucose 113 mg, potassium 3.8, BUN 25, creatinine 1.1, eGFR greater than 60 mL. HB 8.9/HCT 27.2, platelets 155. 05/08/2016: RBC 4.14, Hgb 12.5, Hct 35.5, Plts 124, CBC otherwise normal. TSH 4.8. Potassium 4.6, Creatinine 1.0, glucose 204, BMP normal. 05/07/70: Glucose 332, creatinine 0.93, sodium 129, potassium 4.7, BMP otherwise normal. Hemoglobin 12.7, hematocrit 36.7, no indices, platelets 130. INR  0.9, PT 9.5 03/30/2016: Cholesterol 222,  triglycerides 187, HDL 47, LDL 138. A1c 6.9%. Carotid stenosis, right (I65.21)  Carotid artery duplex 10/17/2016 Stenosis in the right internal carotid artery (50-69%). Mild stenosis in the right common carotid artery (<50%) with diffuse soft plaque. Mild stenosis in the left common carotid artery (<50%) with diffuse soft plaque. Antegrade right vertebral artery flow. Antegrade left vertebral artery flow. Follow up in six months is appropriate if clinically indicated. No significant change from 04/09/2016. Uncontrolled type 2 diabetes mellitus with complication, with long-term current use of insulin (E11.8, E11.65)  Gastroesophageal reflux disease without esophagitis (K21.9)  Encounter for screening for vascular disease (Z13.6)  ABI 05/29/2016: This exam reveals moderately decreased perfusion of the lower extremity, noted at the dorsalis pedis artery level. LABI 0.63 and RABI 0.51 with mdiffuse disease involving PT vessel. Claudication (I73.9)  History of cerebrovascular accident with residual effects (I69.90) [03/30/2016]: MRI Brain, Brain Stem 03/29/2016 Acute lacunar type infarct in the left lower brainstem at the pontomedullary junction. 2nd stroke after CABG Right cerebellar stroke on 06/21/2016. 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 at age 71, heart disease Mother  Living, no heart conditions Sister 1  Deceased. younger, aneurysm Brother 1  younger, no known heart conditions  Social History ([name]; 01/13/17 8:55 AM) Marital status  Single. Number of Children  1. Current tobacco use  Former smoker. quit  03/29/2016 Alcohol Use  Occasional alcohol use. Living Situation  Lives alone.  Past Surgical History ([name]; 13-Jan-2017 8:55 AM) Coronary Artery Bypass, Four [06/17/2016]: CABG 06/17/2016: LIMA to LAD, SVG to D1, SVG to ramus intermediate, SVG to PDA by [name], MD  Medication History ([name]; Jan 13, 2017 9:06 AM) AmLODIPine Besylate (10MG Tablet, 1 (one) Tablet Oral daily, Taken starting 11/11/2016) Active. Olmesartan Medoxomil (40MG Tablet, 1 (one) Tablet Tablet Oral daily, Taken starting 10/09/2016) Active. (D/C Valsartan) Clopidogrel Bisulfate (75MG Tablet, 1 (one) Tablet Tablet Tablet T Oral daily, Taken starting 04/26/2016) Active. Metoprolol Tartrate (25MG Tablet, 1 (one) Tablet Tablet Tablet T Oral two times daily, Taken starting 04/26/2016) Active. Atorvastatin Calcium (40MG Tablet, 1 Tablet Tablet Tablet Tablet Oral daily, Taken starting 04/26/2016) Active. (Continuation of therapy) Aspirin (81MG Tablet Chewable, 1 (one) Tablet Tablet Tablet T Oral daily, Taken starting 04/04/2016) Active. MetFORMIN HCl (500MG Tablet, 1 Oral two times daily) Active. Naproxen Sodium (220MG Tablet, 1 Oral as needed) Active. Allopurinol (100MG Tablet, 2 Oral daily) Active. Levemir FlexTouch (100UNIT/ML Soln Pen-inj, 40U Subcutaneous daily) Active. Medications Reconciled (Verbally; Fill Hx verified)  Diagnostic Studies History ([name]; 01/13/2017 8:36 AM) Carotid Doppler [12/18/2016]: No hemodynamically significant stenoses are identified in the right lower extremity arterial system. There is <50% stenosis in right mid SFA. Moderate velocity increase at the left mid superficial femoral artery suggestive of > 50% stenosis. Left popliteal artery appears ectatic/aneurysmal and measured 1.0 cm. Heteregenous plaque. This exam reveals moderately decreased perfusion of the lower extremity, RABI 0.77 and LABI 0.60 noted at the post tibial artery level  (ABI)    Review of Systems Gwinda Maine FNP-C; Jan 13, 2017 9:46 AM) General Present- Fatigue (much improved since last OV). Not Present- Anorexia and Fever. HEENT Present- Blurred Vision (stable).  Respiratory Present- Difficulty Breathing on Exertion. Not Present- Cough, Wakes up from Sleep Wheezing or Short of Breath and Wheezing. Cardiovascular Present- Claudications (bilateral calf, R>L). Not Present- Edema, Orthopnea and Paroxysmal Nocturnal Dyspnea. Gastrointestinal Not Present- Black, Tarry Stool, Change in Bowel Habits and Nausea. Neurological Not Present- Focal Neurological Symptoms and Syncope. Psychiatric Not Present- Hallucinations. Endocrine Not Present- Cold Intolerance, Excessive Sweating, Heat Intolerance and Thyroid Problems. Hematology Not Present- Anemia, Easy Bruising, Petechiae and Prolonged Bleeding. All other systems negative  Vitals ([name]; 12/26/2016 9:08 AM) 12/26/2016 9:01 AM Weight: 210.06 lb Height: 72in Body Surface Area: 2.18 m Body Mass Index: 28.49 kg/m  Pulse: 75 (Regular)  P.OX: 97% (Room air) BP: 132/68 (Sitting, Left Arm, Standard)  Physical Exam Gwinda Maine FNP-C; 12/26/2016 9:41 AM) General Mental Status-Alert. General Appearance-Cooperative and Appears stated age. Build & Nutrition-Well built and Well nourished.  Head and Neck Thyroid Gland Characteristics - normal size and consistency and no palpable nodules.  Chest and Lung Exam Chest and lung exam reveals -quiet, even and easy respiratory effort with no use of accessory muscles, non-tender and on auscultation, normal breath sounds, no adventitious sounds.  Cardiovascular Cardiovascular examination reveals -normal heart sounds, regular rate and rhythm with no murmurs.  Abdomen Palpation/Percussion Normal exam - Non Tender and No hepatosplenomegaly.  Peripheral Vascular Lower Extremity Inspection - Bilateral - Loss of hair, No Digital  ulcers(right leg medial to the shin mid segment now resolved), No Varicose ulcers, no Varicose veins. Palpation - Temperature - Bilateral - Normal. Edema - Bilateral - No edema. Femoral pulse - Bilateral - Normal. Popliteal pulse - Bilateral - Normal. Dorsalis pedis pulse - Bilateral - Absent. Posterior tibial pulse - Bilateral - Absent. Carotid arteries - Bilateral-No Carotid bruit. Abdomen-No prominent abdominal aortic pulsation, No epigastric bruit.  Neurologic Neurologic evaluation reveals -alert and oriented x 3 with no impairment of recent or remote memory. Motor-Grossly intact without any focal deficits.  Musculoskeletal Global Assessment Left Lower Extremity - no deformities, masses or tenderness, no known fractures. Right Lower Extremity - no deformities, masses or tenderness, no known fractures.   Results Gwinda Maine FNP-C; 12/26/2016 9:41 AM) Procedures  Name Value Date Complete duplex ultrasound of arteries of lower extremity (54008) Comments: Lower extremity arterial duplex 12/18/2016: No hemodynamically significant stenoses are identified in the right lower extremity arterial system. There is <50% stenosis in right mid SFA. Moderate velocity increase at the left mid superficial femoral artery suggestive of > 50% stenosis. Left popliteal artery appears ectatic/aneurysmal and measured 1.0 cm. Heteregenous plaque. This exam reveals moderately decreased perfusion of the lower extremity, RABI 0.77 and LABI 0.60 noted at the post tibial artery level (ABI).  Performed: 12/18/2016 11:54 AM    Assessment & Plan Gwinda Maine FNP-C; 12/26/2016 9:41 AM) PAD (peripheral artery disease) (I73.9) Story: Lower extremity arterial duplex 12/18/2016: No hemodynamically significant stenoses are identified in the right lower extremity arterial system. There is <50% stenosis in right mid SFA. Moderate velocity increase at the left mid superficial femoral artery suggestive of >  50% stenosis. Left popliteal artery appears ectatic/aneurysmal and measured 1.0 cm. Heteregenous plaque. This exam reveals moderately decreased perfusion of the lower extremity, RABI 0.77 and LABI 0.60 noted at the post tibial artery level (ABI). Claudication (I73.9) Benign essential hypertension (I10) Labwork Story: 01/16/2017: Glucose 148, creatinine 1.16, EGFR 65/75, potassium 4.4, BMP normal.  RDW 16%, CBC otherwise normal.  INR 1.0, prothrombin time 10.5.  Labs 06/22/2016: Total cholesterol 97, triglycerides 142, HDL  24, LDL 45. A1c 4.2%. Serum glucose 113 mg, potassium 3.8, BUN 25, creatinine 1.1, eGFR greater than 60 mL. HB 8.9/HCT 27.2, platelets 155.  05/08/2016: RBC 4.14, Hgb 12.5, Hct 35.5, Plts 124, CBC otherwise normal. TSH 4.8. Potassium 4.6, Creatinine 1.0, glucose 204, BMP normal.  05/07/70: Glucose 332, creatinine 0.93, sodium 129, potassium 4.7, BMP otherwise normal. Hemoglobin 12.7, hematocrit 36.7, no indices, platelets 130. INR 0.9, PT 9.5  03/30/2016: Cholesterol 222, triglycerides 187, HDL 47, LDL 138. A1c 6.9%.  Note:Recommendation:  Patient presents for follow-up for lower extremity duplex results. Given findings and his persistent symptoms of claudication would recommend proceeding with peripheral angiogram. Needs to schedule for peripheral arteriogram and possible angioplasty given symptoms. Patient understands the risks, benefits, alternatives including medical therapy, CT angiography. Patient understands <1-2% risk of death, embolic complications, bleeding, infection, renal failure, urgent surgical revascularization, but not limited to these and wants to proceed. Vascular exam remains unchanged. No evidence of ischemic limb. Blood pressure remained stable. We'll see him back after the procedure for further recommendations and evaluation.  *I have discussed this case with Dr. Einar Gip and he participated in formulating the plan.*  Signed electronically by Gwinda Maine, FNP-C (12/26/2016 9:47 AM)

## 2017-01-21 ENCOUNTER — Ambulatory Visit (HOSPITAL_COMMUNITY)
Admission: RE | Admit: 2017-01-21 | Discharge: 2017-01-21 | Disposition: A | Discharge status: Home / Self Care | Payer: PPO | Source: Ambulatory Visit | Attending: Cardiology | Admitting: Cardiology

## 2017-01-21 ENCOUNTER — Encounter (HOSPITAL_COMMUNITY)
Admission: RE | Disposition: A | Discharge status: Home / Self Care | Payer: Self-pay | Source: Ambulatory Visit | Attending: Cardiology

## 2017-01-21 DIAGNOSIS — I2510 Atherosclerotic heart disease of native coronary artery without angina pectoris: Secondary | ICD-10-CM | POA: Diagnosis not present

## 2017-01-21 DIAGNOSIS — I739 Peripheral vascular disease, unspecified: Secondary | ICD-10-CM | POA: Diagnosis present

## 2017-01-21 DIAGNOSIS — Z7982 Long term (current) use of aspirin: Secondary | ICD-10-CM | POA: Diagnosis not present

## 2017-01-21 DIAGNOSIS — I10 Essential (primary) hypertension: Secondary | ICD-10-CM | POA: Insufficient documentation

## 2017-01-21 DIAGNOSIS — Z79899 Other long term (current) drug therapy: Secondary | ICD-10-CM | POA: Diagnosis not present

## 2017-01-21 DIAGNOSIS — Z8673 Personal history of transient ischemic attack (TIA), and cerebral infarction without residual deficits: Secondary | ICD-10-CM | POA: Diagnosis not present

## 2017-01-21 DIAGNOSIS — K219 Gastro-esophageal reflux disease without esophagitis: Secondary | ICD-10-CM | POA: Diagnosis not present

## 2017-01-21 DIAGNOSIS — Z7984 Long term (current) use of oral hypoglycemic drugs: Secondary | ICD-10-CM | POA: Insufficient documentation

## 2017-01-21 DIAGNOSIS — I70212 Atherosclerosis of native arteries of extremities with intermittent claudication, left leg: Secondary | ICD-10-CM | POA: Diagnosis not present

## 2017-01-21 DIAGNOSIS — E782 Mixed hyperlipidemia: Secondary | ICD-10-CM | POA: Insufficient documentation

## 2017-01-21 DIAGNOSIS — Z951 Presence of aortocoronary bypass graft: Secondary | ICD-10-CM | POA: Insufficient documentation

## 2017-01-21 HISTORY — PX: LOWER EXTREMITY ANGIOGRAPHY: CATH118251

## 2017-01-21 LAB — GLUCOSE, CAPILLARY
Glucose-Capillary: 111 mg/dL — ABNORMAL HIGH (ref 65–99)
Glucose-Capillary: 109 mg/dL — ABNORMAL HIGH (ref 65–99)

## 2017-01-21 SURGERY — LOWER EXTREMITY ANGIOGRAPHY
Anesthesia: LOCAL

## 2017-01-21 MED ORDER — SODIUM CHLORIDE 0.9% FLUSH: 3.0000 mL | Freq: Two times a day (BID) | INTRAVENOUS | Status: DC

## 2017-01-21 MED ORDER — ACETAMINOPHEN 325 MG PO TABS
650.0000 mg | ORAL_TABLET | Freq: Once | ORAL | Status: AC
  Administered 2017-01-21: 650 mg via ORAL

## 2017-01-21 MED ORDER — SODIUM CHLORIDE 0.9 % IV SOLN
INTRAVENOUS | Status: DC
  Administered 2017-01-21: 09:00:00 via INTRAVENOUS

## 2017-01-21 MED ORDER — HEPARIN (PORCINE) IN NACL 2-0.9 UNIT/ML-% IJ SOLN
INTRAMUSCULAR | Status: AC | PRN
  Administered 2017-01-21: 1000 mL via INTRA_ARTERIAL

## 2017-01-21 MED ORDER — IODIXANOL 320 MG/ML IV SOLN
INTRAVENOUS | Status: DC | PRN
  Administered 2017-01-21: 130 mL via INTRA_ARTERIAL

## 2017-01-21 MED ORDER — SODIUM CHLORIDE 0.9 % IV SOLN: 250.0000 mL | INTRAVENOUS | Status: DC | PRN

## 2017-01-21 MED ORDER — METFORMIN HCL 500 MG PO TABS: 500.0000 mg | ORAL_TABLET | Freq: Two times a day (BID) | ORAL | 0 refills | Status: DC

## 2017-01-21 MED ORDER — SODIUM CHLORIDE 0.9% FLUSH: 3.0000 mL | INTRAVENOUS | Status: DC | PRN

## 2017-01-21 MED ORDER — MIDAZOLAM HCL 2 MG/2ML IJ SOLN
INTRAMUSCULAR | Status: DC | PRN
  Administered 2017-01-21: 1 mg via INTRAVENOUS

## 2017-01-21 MED ORDER — HEPARIN (PORCINE) IN NACL 2-0.9 UNIT/ML-% IJ SOLN
INTRAMUSCULAR | Status: AC
  Filled 2017-01-21: qty 1000

## 2017-01-21 MED ORDER — ACETAMINOPHEN 325 MG PO TABS
ORAL_TABLET | ORAL | Status: AC
  Filled 2017-01-21: qty 2

## 2017-01-21 MED ORDER — SODIUM CHLORIDE 0.9 % WEIGHT BASED INFUSION: 1.0000 mL/kg/h | INTRAVENOUS | Status: DC

## 2017-01-21 MED ORDER — MIDAZOLAM HCL 2 MG/2ML IJ SOLN
INTRAMUSCULAR | Status: AC
  Filled 2017-01-21: qty 2

## 2017-01-21 MED ORDER — SODIUM CHLORIDE 0.9 % IV SOLN
Freq: Once | INTRAVENOUS | Status: AC
  Administered 2017-01-21: 08:00:00 via INTRAVENOUS

## 2017-01-21 MED ORDER — FENTANYL CITRATE (PF) 100 MCG/2ML IJ SOLN
INTRAMUSCULAR | Status: AC
  Filled 2017-01-21: qty 2

## 2017-01-21 MED ORDER — LIDOCAINE HCL 2 % IJ SOLN
INTRAMUSCULAR | Status: DC | PRN
  Administered 2017-01-21: 15 mL

## 2017-01-21 MED ORDER — FENTANYL CITRATE (PF) 100 MCG/2ML IJ SOLN
INTRAMUSCULAR | Status: DC | PRN
  Administered 2017-01-21: 25 ug via INTRAVENOUS
  Administered 2017-01-21: 50 ug via INTRAVENOUS

## 2017-01-21 MED ORDER — LIDOCAINE HCL 2 % IJ SOLN
INTRAMUSCULAR | Status: AC
  Filled 2017-01-21: qty 20

## 2017-01-21 SURGICAL SUPPLY — 11 items
CATH OMNI FLUSH 5F 65CM (CATHETERS) ×2 IMPLANT
COVER PRB 48X5XTLSCP FOLD TPE (BAG) ×2 IMPLANT
COVER PROBE 5X48 (BAG) ×2
GUIDEWIRE ANGLED .035X150CM (WIRE) ×2 IMPLANT
KIT MICROINTRODUCER STIFF 5F (SHEATH) ×2 IMPLANT
KIT PV (KITS) ×2 IMPLANT
SHEATH PINNACLE 5F 10CM (SHEATH) ×2 IMPLANT
SYRINGE MEDRAD AVANTA MACH 7 (SYRINGE) ×2 IMPLANT
TRANSDUCER W/STOPCOCK (MISCELLANEOUS) ×2 IMPLANT
TRAY PV CATH (CUSTOM PROCEDURE TRAY) ×2 IMPLANT
WIRE BENTSON .035X145CM (WIRE) ×2 IMPLANT

## 2017-01-21 NOTE — Progress Notes (Signed)
Lower extremity arterial duplex 12/18/2016: No hemodynamically significant stenoses are identified in the right lower extremity arterial system. There is <50% stenosis in right mid SFA. Moderate velocity increase at the left mid superficial femoral artery suggestive of > 50% stenosis. Left popliteal artery appears ectatic/aneurysmal and measured 1.0 cm. Heteregenous plaque. This exam reveals moderately decreased perfusion of the lower extremity, RABI 0.77 and LABI 0.60 noted at the post tibial artery level (ABI).

## 2017-01-21 NOTE — Interval H&P Note (Signed)
History and Physical Interval Note:  01/21/2017 11:36 AM  Drew Harrison  has presented today for surgery, with the diagnosis of pad  The various methods of treatment have been discussed with the patient and family. After consideration of risks, benefits and other options for treatment, the patient has consented to  Procedure(s): Lower Extremity Angiography (N/A) as a surgical intervention .  The patient's history has been reviewed, patient examined, no change in status, stable for surgery.  I have reviewed the patient's chart and labs.  Questions were answered to the patient's satisfaction.     [place]

## 2017-01-21 NOTE — Progress Notes (Signed)
Order for sheath removal verified per post procedural orders. Procedure explained to patient and Rt femoral artery access site assessed: level 0,doppler dorsalis pedis and posterior tibial pulses. 5French Sheath removed and manual pressure applied for 25 minutes. Pre, peri, & post procedural vitals: HR 99, RR 18, O2 Sat upper 99, BP 140-150/80, Pain 0. Distal pulses remained intact after sheath removal. Access site level 0 and dressed with 4X4 gauze and tegaderm. Post procedural instructions discussed and return demonstration from patient.

## 2017-01-21 NOTE — Progress Notes (Signed)
Client c/o 2 1/12 over 10 lower back pain and Dr Robb Matar notified and order noted

## 2017-01-21 NOTE — Discharge Instructions (Signed)
°Moderate Conscious Sedation, Adult, Care After °These instructions provide you with information about caring for yourself after your procedure. Your health care provider may also give you more specific instructions. Your treatment has been planned according to current medical practices, but problems sometimes occur. Call your health care provider if you have any problems or questions after your procedure. °What can I expect after the procedure? °After your procedure, it is common: °· To feel sleepy for several hours. °· To feel clumsy and have poor balance for several hours. °· To have poor judgment for several hours. °· To vomit if you eat too soon. ° °Follow these instructions at home: °For at least 24 hours after the procedure: ° °· Do not: °? Participate in activities where you could fall or become injured. °? Drive. °? Use heavy machinery. °? Drink alcohol. °? Take sleeping pills or medicines that cause drowsiness. °? Make important decisions or sign legal documents. °? Take care of children on your own. °· Rest. °Eating and drinking °· Follow the diet recommended by your health care provider. °· If you vomit: °? Drink water, juice, or soup when you can drink without vomiting. °? Make sure you have little or no nausea before eating solid foods. °General instructions °· Have a responsible adult stay with you until you are awake and alert. °· Take over-the-counter and prescription medicines only as told by your health care provider. °· If you smoke, do not smoke without supervision. °· Keep all follow-up visits as told by your health care provider. This is important. °Contact a health care provider if: °· You keep feeling nauseous or you keep vomiting. °· You feel light-headed. °· You develop a rash. °· You have a fever. °Get help right away if: °· You have trouble breathing. °This information is not intended to replace advice given to you by your health care provider. Make sure you discuss any questions you  have with your health care provider. °Document Released: 12/30/2012 Document Revised: 08/14/2015 Document Reviewed: 07/01/2015 °Elsevier Interactive Patient Education © 2018 [employer] ° ° ° °Femoral Site Care °Refer to this sheet in the next few weeks. These instructions provide you with information about caring for yourself after your procedure. Your health care provider may also give you more specific instructions. Your treatment has been planned according to current medical practices, but problems sometimes occur. Call your health care provider if you have any problems or questions after your procedure. °What can I expect after the procedure? °After your procedure, it is typical to have the following: °· Bruising at the site that usually fades within 1-2 weeks. °· Blood collecting in the tissue (hematoma) that may be painful to the touch. It should usually decrease in size and tenderness within 1-2 weeks. ° °Follow these instructions at home: °· Take medicines only as directed by your health care provider. °· You may shower 24-48 hours after the procedure or as directed by your health care provider. Remove the bandage (dressing) and gently wash the site with plain soap and water. Pat the area dry with a clean towel. Do not rub the site, because this may cause bleeding. °· Do not take baths, swim, or use a hot tub until your health care provider approves. °· Check your insertion site every day for redness, swelling, or drainage. °· Do not apply powder or lotion to the site. °· Limit use of stairs to twice a day for the first 2-3 days or as directed by your health care   provider. °· Do not squat for the first 2-3 days or as directed by your health care provider. °· Do not lift over 10 lb (4.5 kg) for 5 days after your procedure or as directed by your health care provider. °· Ask your health care provider when it is okay to: °? Return to work or school. °? Resume usual physical activities or sports. °? Resume  sexual activity. °· Do not drive home if you are discharged the same day as the procedure. Have someone else drive you. °· You may drive 24 hours after the procedure unless otherwise instructed by your health care provider. °· Do not operate machinery or power tools for 24 hours after the procedure or as directed by your health care provider. °· If your procedure was done as an outpatient procedure, which means that you went home the same day as your procedure, a responsible adult should be with you for the first 24 hours after you arrive home. °· Keep all follow-up visits as directed by your health care provider. This is important. °Contact a health care provider if: °· You have a fever. °· You have chills. °· You have increased bleeding from the site. Hold pressure on the site. °Get help right away if: °· You have unusual pain at the site. °· You have redness, warmth, or swelling at the site. °· You have drainage (other than a small amount of blood on the dressing) from the site. °· The site is bleeding, and the bleeding does not stop after 30 minutes of holding steady pressure on the site. °· Your leg or foot becomes pale, cool, tingly, or numb. °This information is not intended to replace advice given to you by your health care provider. Make sure you discuss any questions you have with your health care provider. °Document Released: 11/12/2013 Document Revised: 08/17/2015 Document Reviewed: 09/28/2013 °Elsevier Interactive Patient Education © 2018 [employer] ° °

## 2017-01-22 ENCOUNTER — Encounter (HOSPITAL_COMMUNITY): Payer: Self-pay | Admitting: Cardiology

## 2017-01-30 ENCOUNTER — Ambulatory Visit (INDEPENDENT_AMBULATORY_CARE_PROVIDER_SITE_OTHER): Payer: PPO | Admitting: Gastroenterology

## 2017-01-30 ENCOUNTER — Encounter: Payer: Self-pay | Admitting: Gastroenterology

## 2017-01-30 ENCOUNTER — Telehealth: Payer: Self-pay

## 2017-01-30 VITALS — BP 160/68 | HR 84 | Ht 72.0 in | Wt 212.0 lb

## 2017-01-30 DIAGNOSIS — Z1211 Encounter for screening for malignant neoplasm of colon: Secondary | ICD-10-CM | POA: Diagnosis not present

## 2017-01-30 DIAGNOSIS — K219 Gastro-esophageal reflux disease without esophagitis: Secondary | ICD-10-CM

## 2017-01-30 DIAGNOSIS — Z7902 Long term (current) use of antithrombotics/antiplatelets: Secondary | ICD-10-CM

## 2017-01-30 NOTE — Patient Instructions (Signed)
If you are age 66 or older, your body mass index should be between 23-30. Your Body mass index is 28.75 kg/m. If this is out of the aforementioned range listed, please consider follow up with your Primary Care Provider.  If you are age 1 or younger, your body mass index should be between 19-25. Your Body mass index is 28.75 kg/m. If this is out of the aformentioned range listed, please consider follow up with your Primary Care Provider.   It has been recommended to you by your physician that you have an endoscopy/coloscopy completed. We will be in contact with Dr. Einar Gip to discuss the possibility of holding your Plavix before your procedure.  Thank you.

## 2017-01-30 NOTE — Progress Notes (Signed)
HPI :  66 y/o male with a history of CAD s/p CABG 08/8339 complicated by post-operative CVA, history of ischemic CVA in 03/2016, PVD, on Plavix and aspirin 81mg , referred here to discuss GERD and colon cancer screening. 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   Labs Care-Everywhere 10/25/2016 - Hgb 13.6, MCV 81, plt 165  Normal BUN / Creatinine  Reports ongoing esophageal reflux symptoms for years. He states over this time is taken Pepcid as needed, it tends to help resolve his symptoms when he takes it. More recently he has not needed to use it that much. He denies any history of dysphagia. He denies any abdominal pains. No nausea or vomiting. Denies any bowel habit changes. No blood in the stools. No weight loss, fact he thinks he is gaining weight. He denies any NSAID use. He denies any shortness of breath or chest pains following his CABG.  Overall he is subjectively concerned about his risk for esophageal cancer given his long term tobacco use and long-standing reflux. He has never had a prior EGD to screen for Barrett's esophagus.  No prior colonoscopy or colon cancer screening  Echo 06/17/2016 - EF 50-55%   Past Medical History:  Diagnosis Date  . Abnormal nuclear stress test   . Coronary artery disease    3-vessel  . Diabetes mellitus without complication ([place])   . GERD (gastroesophageal reflux disease)   . Gout   . Headache   . History of tobacco abuse   . Hyperlipidemia   . Hypertension   . Ischemic stroke ([place])   . Stroke ([place]) 03/29/2016   left facial droop  . Stroke due to embolism ([place])   . Visual disturbance      Past Surgical History:  Procedure Laterality Date  . TONSILLECTOMY     Family History  Problem Relation Age of Onset  . Heart disease Father   . Colon cancer Neg Hx   . Esophageal cancer Neg Hx   . Rectal cancer Neg Hx     Social History   Tobacco Use  . Smoking status: Former Smoker    Packs/day: 1.50    Years: 48.00    Pack years: 72.00    Types: Cigarettes    Last attempt to quit: 03/29/2016    Years since quitting: 0.8  . Smokeless tobacco: Never Used  Substance Use Topics  . Alcohol use: Yes    Comment: couple drinks per week  . Drug use: Yes    Types: Cocaine    Comment: last usage 12 yrs.   Current Outpatient Medications  Medication Sig Dispense Refill  . allopurinol (ZYLOPRIM) 100 MG tablet Take 200 mg by mouth daily.   0  . amLODipine (NORVASC) 10 MG tablet Take 10 mg by mouth daily.    Marland Kitchen aspirin 81 MG tablet Take 81 mg by mouth daily.     Marland Kitchen atorvastatin (LIPITOR) 40 MG tablet Take 1 tablet (40 mg total) by mouth daily at 6 PM. 30 tablet 0  . clopidogrel (PLAVIX) 75 MG tablet Take 75 mg by mouth daily.    . insulin detemir (LEVEMIR) 100 UNIT/ML injection Inject 0.18 mLs (18 Units total) into the skin 2 (two) times daily. (Patient taking differently: Inject 40 Units into the skin at bedtime. ) 10 mL 11  . metFORMIN (GLUCOPHAGE) 500 MG tablet Take 1 tablet (500 mg total) by  mouth 2 (two) times daily with a meal. 60 tablet 0  . metoprolol tartrate (LOPRESSOR) 25 MG tablet Take 1 tablet (25 mg total) by mouth 2 (two) times daily.    . naproxen sodium (ANAPROX) 220 MG tablet Take 220 mg by mouth daily as needed (pain).    Marland Kitchen olmesartan (BENICAR) 40 MG tablet Take 40 mg by mouth daily.    . famotidine (PEPCID) 40 MG tablet Take 40 mg by mouth daily as needed for indigestion.      No current facility-administered medications for this visit.    No Known Allergies   Review of Systems: All systems reviewed and negative except where noted in HPI.   Labs as outlined above  Physical Exam: BP (!) 160/68   Pulse 84   Ht 6' (1.829 m)   Wt 212 lb (96.2 kg)   BMI 28.75 kg/m  Constitutional: Pleasant,well-developed, male in no acute distress. HEENT: Normocephalic and atraumatic. Conjunctivae  are normal. No scleral icterus. Neck supple.  Cardiovascular: Normal rate, regular rhythm.  Pulmonary/chest: Effort normal and breath sounds normal. No wheezing, rales or rhonchi. Abdominal: Soft, nondistended, nontender. There are no masses palpable. No hepatomegaly. Extremities: no edema Lymphadenopathy: No cervical adenopathy noted. Neurological: Alert and oriented to person place and time. Skin: Skin is warm and dry. No rashes noted. Psychiatric: Normal mood and affect. Behavior is normal.   ASSESSMENT AND PLAN: 66 year old male with history of CAD status post CABG, history of CVA, history of PVD, on aspirin and Plavix, here to discuss the following issues:  GERD - long-standing symptoms, using as needed Pepcid which controls things fairly well. No dysphagia or alarm symptoms. However given his age, ethnicity, and long standing tobacco use, he is at risk for Barrett's esophagus. We discussed whether or not he wanted to have a screening endoscopy for this. He feels very strongly about having a screening endoscopy, and is anxious about the possibility for Barrett's. I discussed the risks and benefits of endoscopy and anesthesia with him, he wanted to proceed. He is seeing his cardiologist Dr. Einar Gip tomorrow, I'll ask him if he is okay to hold his Plavix for colonoscopy as outlined below, he can continue aspirin. If he is not able to hold his Plavix, we can still continue with an EGD with biopsies on aspirin and plavix and screen for Barrett's esophagus in this setting. We will contact him in the upcoming few days to discuss scheduling once we hear back from Dr. Einar Gip. I offered him a trial of PPI therapy in the interim, he declines.  Colon cancer screening / antiplatelet therapy - I discussed screening options for colon cancer to include stool based testing optical colonoscopy. Patient strongly wants to proceed with optical colonoscopy. As above we will await cardiology approval to hold Plavix. If  not able to, we may forego colonoscopy until he is able to do this, but can still proceed with EGD. He agreed.   [place] Cellar, MD [facility] Pager [phone]  [facility] [place], [place]*

## 2017-01-30 NOTE — Telephone Encounter (Signed)
   Drew Harrison 09/10/1950 [id number]  Dear Dr. Einar Gip:  We would like to schedule the above named patient for an endoscopy and colonoscopy procedure. Our records show that he is on anticoagulation therapy.  Please advise as to whether you would be comfortable with the patient coming off their therapy of Plavix 5 days prior to such procedures.  Please route your response to Lemar Lofty, CMA for Dr. [place] Cellar or fax response to [phone].  Sincerely,    [facility]

## 2017-01-31 DIAGNOSIS — I739 Peripheral vascular disease, unspecified: Secondary | ICD-10-CM | POA: Diagnosis not present

## 2017-01-31 DIAGNOSIS — I10 Essential (primary) hypertension: Secondary | ICD-10-CM | POA: Diagnosis not present

## 2017-01-31 NOTE — Telephone Encounter (Signed)
Dr Havery Moros- Please see note from Dr Einar Gip and advise how to proceed.

## 2017-01-31 NOTE — Telephone Encounter (Signed)
Drew Prows, MD  Roetta Sessions, CMA        Okay to hold for 5 days and restart same day if no biopsy otherwise 5 days later   Previous Messages

## 2017-01-31 NOTE — Telephone Encounter (Signed)
I have spoken to patient to schedule endoscopy/colonoscopy in [facility] and to advise that per Dr Einar Gip, he may hold Plavix 5 days prior to procedure and restart day of procedure if no biopsy or otherwise 5 days later (at discretion of procedure MD). Patient was offered 02/21/17 appointment for endo/colon at 3:30 pm as he states he has already met his deductible and this is the only date I currently have for endo/colon available before the end of the year. Patient does not want to schedule at this time as he wants to make sure he has a ride. I advised that he may want to go ahead and schedule to secure a spot and then call back to reschedule if needed but he chooses not to do so at this time. Patient again states he will call back next week. Dr Havery Moros has been advised of this as well.

## 2017-01-31 NOTE — Telephone Encounter (Signed)
Thanks Carla Drape, Okay to schedule EGD and colonoscopy at the [facility], first available opening. He should hold plavix for 5 days prior to the procedure, but can continue aspirin throughout. Can you or Jan help schedule? Thanks very much

## 2017-02-03 ENCOUNTER — Telehealth: Payer: Self-pay | Admitting: Gastroenterology

## 2017-02-03 NOTE — Telephone Encounter (Signed)
A user error has taken place.

## 2017-02-05 ENCOUNTER — Other Ambulatory Visit: Payer: Self-pay

## 2017-02-05 ENCOUNTER — Other Ambulatory Visit: Payer: Self-pay | Admitting: Cardiology

## 2017-02-05 ENCOUNTER — Ambulatory Visit (AMBULATORY_SURGERY_CENTER): Payer: Self-pay | Admitting: *Deleted

## 2017-02-05 VITALS — Ht 72.0 in | Wt 218.8 lb

## 2017-02-05 DIAGNOSIS — Z8719 Personal history of other diseases of the digestive system: Secondary | ICD-10-CM

## 2017-02-05 DIAGNOSIS — I872 Venous insufficiency (chronic) (peripheral): Secondary | ICD-10-CM

## 2017-02-05 DIAGNOSIS — Z1211 Encounter for screening for malignant neoplasm of colon: Secondary | ICD-10-CM

## 2017-02-05 DIAGNOSIS — I739 Peripheral vascular disease, unspecified: Secondary | ICD-10-CM

## 2017-02-05 MED ORDER — NA SULFATE-K SULFATE-MG SULF 17.5-3.13-1.6 GM/177ML PO SOLN: ORAL | 0 refills | Status: DC

## 2017-02-05 NOTE — Progress Notes (Signed)
No allergies to eggs or soy. No problems with anesthesia.  Pt given Emmi instructions for colonoscopy/egd  No oxygen use  No diet drug use  

## 2017-02-21 ENCOUNTER — Other Ambulatory Visit: Payer: Self-pay

## 2017-02-21 ENCOUNTER — Ambulatory Visit (AMBULATORY_SURGERY_CENTER): Payer: PPO | Admitting: Gastroenterology

## 2017-02-21 ENCOUNTER — Encounter: Payer: Self-pay | Admitting: Gastroenterology

## 2017-02-21 VITALS — BP 141/76 | HR 84 | Temp 98.0°F | Resp 19 | Ht 73.0 in | Wt 212.0 lb

## 2017-02-21 DIAGNOSIS — K2980 Duodenitis without bleeding: Secondary | ICD-10-CM | POA: Diagnosis not present

## 2017-02-21 DIAGNOSIS — K219 Gastro-esophageal reflux disease without esophagitis: Secondary | ICD-10-CM

## 2017-02-21 DIAGNOSIS — D120 Benign neoplasm of cecum: Secondary | ICD-10-CM | POA: Diagnosis not present

## 2017-02-21 DIAGNOSIS — K2950 Unspecified chronic gastritis without bleeding: Secondary | ICD-10-CM | POA: Diagnosis not present

## 2017-02-21 DIAGNOSIS — K635 Polyp of colon: Secondary | ICD-10-CM

## 2017-02-21 DIAGNOSIS — K3189 Other diseases of stomach and duodenum: Secondary | ICD-10-CM | POA: Diagnosis not present

## 2017-02-21 DIAGNOSIS — D125 Benign neoplasm of sigmoid colon: Secondary | ICD-10-CM | POA: Diagnosis not present

## 2017-02-21 DIAGNOSIS — Z1211 Encounter for screening for malignant neoplasm of colon: Secondary | ICD-10-CM

## 2017-02-21 DIAGNOSIS — Z8673 Personal history of transient ischemic attack (TIA), and cerebral infarction without residual deficits: Secondary | ICD-10-CM | POA: Diagnosis not present

## 2017-02-21 MED ORDER — SODIUM CHLORIDE 0.9 % IV SOLN: 500.0000 mL | INTRAVENOUS | Status: DC

## 2017-02-21 NOTE — Patient Instructions (Signed)
YOU HAD AN ENDOSCOPIC PROCEDURE TODAY AT [facility]:   Refer to the procedure report that was given to you for any specific questions about what was found during the examination.  If the procedure report does not answer your questions, please call your gastroenterologist to clarify.  If you requested that your care partner not be given the details of your procedure findings, then the procedure report has been included in a sealed envelope for you to review at your convenience later.  YOU SHOULD EXPECT: Some feelings of bloating in the abdomen. Passage of more gas than usual.  Walking can help get rid of the air that was put into your GI tract during the procedure and reduce the bloating. If you had a lower endoscopy (such as a colonoscopy or flexible sigmoidoscopy) you may notice spotting of blood in your stool or on the toilet paper. If you underwent a bowel prep for your procedure, you may not have a normal bowel movement for a few days.  Please Note:  You might notice some irritation and congestion in your nose or some drainage.  This is from the oxygen used during your procedure.  There is no need for concern and it should clear up in a day or so.  SYMPTOMS TO REPORT IMMEDIATELY:   Following lower endoscopy (colonoscopy or flexible sigmoidoscopy):  Excessive amounts of blood in the stool  Significant tenderness or worsening of abdominal pains  Swelling of the abdomen that is new, acute  Fever of 100F or higher   Following upper endoscopy (EGD)  Vomiting of blood or coffee ground material  New chest pain or pain under the shoulder blades  Painful or persistently difficult swallowing  New shortness of breath  Fever of 100F or higher  Black, tarry-looking stools  For urgent or emergent issues, a gastroenterologist can be reached at any hour by calling [phone].   DIET:  We do recommend a small meal at first, but then you may proceed to your regular diet.  Drink  plenty of fluids but you should avoid alcoholic beverages for 24 hours.  ACTIVITY:  You should plan to take it easy for the rest of today and you should NOT DRIVE or use heavy machinery until tomorrow (because of the sedation medicines used during the test).    FOLLOW UP: Our staff will call the number listed on your records the next business day following your procedure to check on you and address any questions or concerns that you may have regarding the information given to you following your procedure. If we do not reach you, we will leave a message.  However, if you are feeling well and you are not experiencing any problems, there is no need to return our call.  We will assume that you have returned to your regular daily activities without incident.  If any biopsies were taken you will be contacted by phone or by letter within the next 1-3 weeks.  Please call us at [phone] if you have not heard about the biopsies in 3 weeks.    SIGNATURES/CONFIDENTIALITY: You and/or your care partner have signed paperwork which will be entered into your electronic medical record.  These signatures attest to the fact that that the information above on your After Visit Summary has been reviewed and is understood.  Full responsibility of the confidentiality of this discharge information lies with you and/or your care-partner.  You may resume your plavix in 48 hours.  You may  still take your aspirin in it's place until then.  No ibuprofen, aleve for two weeks. You may take your aspirin.

## 2017-02-21 NOTE — Progress Notes (Signed)
A/ox3 pleased with MAC, report to [employer]

## 2017-02-21 NOTE — Progress Notes (Signed)
Pt's states no medical or surgical changes since previsit or office visit. 

## 2017-02-21 NOTE — Op Note (Signed)
[facility] Patient Name: Drew Harrison Procedure Date: 02/21/2017 3:01 PM MRN: [id number] Endoscopist: Remo Lipps P. Dock Baccam MD, MD Age: 66 Referring MD:  Date of Birth: [birth year] Gender: Male Account #: [id number] Procedure:                Colonoscopy Indications:              Screening for colorectal malignant neoplasm, This                            is the patient's first colonoscopy Medicines:                Monitored Anesthesia Care Procedure:                Pre-Anesthesia Assessment:                           - Prior to the procedure, a History and Physical                            was performed, and patient medications and                            allergies were reviewed. The patient's tolerance of                            previous anesthesia was also reviewed. The risks                            and benefits of the procedure and the sedation                            options and risks were discussed with the patient.                            All questions were answered, and informed consent                            was obtained. Prior Anticoagulants: The patient has                            taken Plavix (clopidogrel), last dose was 5 days                            prior to procedure. ASA Grade Assessment: III - A                            patient with severe systemic disease. After                            reviewing the risks and benefits, the patient was                            deemed in satisfactory condition to undergo the  procedure.                           After obtaining informed consent, the colonoscope                            was passed under direct vision. Throughout the                            procedure, the patient's blood pressure, pulse, and                            oxygen saturations were monitored continuously. The                            Colonoscope was introduced through the anus and                          advanced to the the cecum, identified by                            appendiceal orifice and ileocecal valve. The                            colonoscopy was performed without difficulty. The                            patient tolerated the procedure well. The quality                            of the bowel preparation was good. The ileocecal                            valve, appendiceal orifice, and rectum were                            photographed. Scope In: 3:12:10 PM Scope Out: 3:30:15 PM Scope Withdrawal Time: 0 hours 16 minutes 0 seconds  Total Procedure Duration: 0 hours 18 minutes 5 seconds  Findings:                 The perianal and digital rectal examinations were                            normal.                           A 4 mm polyp was found in the cecum. The polyp was                            sessile. The polyp was removed with a cold snare.                            Resection and retrieval were complete.  Two sessile polyps were found in the sigmoid colon.                            The polyps were 4 to 5 mm in size. These polyps                            were removed with a cold snare. Resection and                            retrieval were complete.                           Many small and large-mouthed diverticula were found                            in the sigmoid colon.                           Internal hemorrhoids were found during retroflexion.                           The exam was otherwise without abnormality. Complications:            No immediate complications. Estimated blood loss:                            Minimal. Estimated Blood Loss:     Estimated blood loss was minimal. Impression:               - One 4 mm polyp in the cecum, removed with a cold                            snare. Resected and retrieved.                           - Two 4 to 5 mm polyps in the sigmoid colon,                             removed with a cold snare. Resected and retrieved.                           - Diverticulosis in the sigmoid colon.                           - Internal hemorrhoids.                           - The examination was otherwise normal. Recommendation:           - Patient has a contact number available for                            emergencies. The signs and symptoms of potential  delayed complications were discussed with the                            patient. Return to normal activities tomorrow.                            Written discharge instructions were provided to the                            patient.                           - Resume previous diet.                           - Continue present medications.                           - Resume plavix in 48 hours. You can take aspirin                            in its place today and tomorrow                           - Await pathology results.                           - Repeat colonoscopy is recommended for                            surveillance. The colonoscopy date will be                            determined after pathology results from today's                            exam become available for review.                           - No ibuprofen, naproxen, or other non-steroidal                            anti-inflammatory drugs for 2 weeks after polyp                            removal. Remo Lipps P. Eydan Chianese MD, MD 02/21/2017 3:56:29 PM This report has been signed electronically.

## 2017-02-21 NOTE — Progress Notes (Signed)
Called to room to assist during endoscopic procedure.  Patient ID and intended procedure confirmed with present staff. Received instructions for my participation in the procedure from the performing physician.  

## 2017-02-21 NOTE — Op Note (Signed)
[facility] Patient Name: Drew Harrison Procedure Date: 02/21/2017 3:01 PM MRN: [id number] Endoscopist: Remo Lipps P. Tykeisha Peer MD, MD Age: 66 Referring MD:  Date of Birth: [birth year] Gender: Male Account #: [id number] Procedure:                Upper GI endoscopy Indications:              history of gastro-esophageal reflux disease,                            screening for Barrett's esophagus Medicines:                Monitored Anesthesia Care Procedure:                Pre-Anesthesia Assessment:                           - Prior to the procedure, a History and Physical                            was performed, and patient medications and                            allergies were reviewed. The patient's tolerance of                            previous anesthesia was also reviewed. The risks                            and benefits of the procedure and the sedation                            options and risks were discussed with the patient.                            All questions were answered, and informed consent                            was obtained. Prior Anticoagulants: The patient has                            taken Plavix (clopidogrel), last dose was 5 days                            prior to procedure. ASA Grade Assessment: III - A                            patient with severe systemic disease. After                            reviewing the risks and benefits, the patient was                            deemed in satisfactory condition to undergo the  procedure.                           After obtaining informed consent, the endoscope was                            passed under direct vision. Throughout the                            procedure, the patient's blood pressure, pulse, and                            oxygen saturations were monitored continuously. The                            Endoscope was introduced through the mouth, and                         advanced to the second part of duodenum. The upper                            GI endoscopy was accomplished without difficulty.                            The patient tolerated the procedure well. Scope In: Scope Out: Findings:                 Esophagogastric landmarks were identified: the                            Z-line was found at 40 cm, the gastroesophageal                            junction was found at 40 cm and the upper extent of                            the gastric folds was found at 44 cm from the                            incisors.                           A 4 cm hiatal hernia was present.                           One very mild benign-appearing, widely patent                            intrinsic stenosis was found 40 cm from the                            incisors. No dilation performed given no symptoms                            of dysphagia and that it is widely patent.  The exam of the esophagus was otherwise normal. No                            evidence of Barrett's esophagus                           The entire examined stomach was normal. Biopsies                            were taken from the antrum, body, and incisura with                            a cold forceps for Helicobacter pylori testing                            given the findings of duodenitis as outlined below.                           Diffuse inflammation was found in the duodenal                            bulb, with some erosions, and polypoid erythema.                            Biopsies were taken with a cold forceps for                            histology, ensure no adenomatous changes.                           The exam of the duodenum was otherwise normal. Complications:            No immediate complications. Estimated blood loss:                            Minimal. Estimated Blood Loss:     Estimated blood loss was minimal. Impression:                - Esophagogastric landmarks identified.                           - 4 cm hiatal hernia.                           - Widely patent mild esophageal stenosis at the GEJ                           - NO evidence of Barrett's esophagus                           - Normal stomach. Biopsied to rule out H pylori.                           - Duodenitis. Biopsied. Recommendation:           - Patient has  a contact number available for                            emergencies. The signs and symptoms of potential                            delayed complications were discussed with the                            patient. Return to normal activities tomorrow.                            Written discharge instructions were provided to the                            patient.                           - Resume previous diet.                           - Continue present medications.                           - Minimize NSAID use                           - Resume Plavix in 48 hours per colonoscopy note,                            can take aspirin in its place today                           - Await pathology results. Remo Lipps P. Joyell Emami MD, MD 02/21/2017 3:51:20 PM This report has been signed electronically.

## 2017-02-24 ENCOUNTER — Telehealth: Payer: Self-pay | Admitting: *Deleted

## 2017-02-24 NOTE — Telephone Encounter (Signed)
  Follow up Call-  Call back number 02/21/2017  Post procedure Call Back phone  # [phone]  Permission to leave phone message Yes     Patient questions:  Do you have a fever, pain , or abdominal swelling? No. Pain Score  0 *  Have you tolerated food without any problems? Yes.    Have you been able to return to your normal activities? Yes.    Do you have any questions about your discharge instructions: Diet   No. Medications  No. Follow up visit  No.  Do you have questions or concerns about your Care? No.  Actions: * If pain score is 4 or above: No action needed, pain <4.

## 2017-02-25 ENCOUNTER — Ambulatory Visit
Admission: RE | Admit: 2017-02-25 | Discharge: 2017-02-25 | Disposition: A | Discharge status: Home / Self Care | Payer: PPO | Source: Ambulatory Visit | Attending: Cardiology | Admitting: Cardiology

## 2017-02-25 ENCOUNTER — Other Ambulatory Visit: Payer: Self-pay | Admitting: Cardiology

## 2017-02-25 DIAGNOSIS — I872 Venous insufficiency (chronic) (peripheral): Secondary | ICD-10-CM

## 2017-02-25 DIAGNOSIS — R600 Localized edema: Secondary | ICD-10-CM | POA: Diagnosis not present

## 2017-02-25 DIAGNOSIS — I739 Peripheral vascular disease, unspecified: Secondary | ICD-10-CM

## 2017-02-25 DIAGNOSIS — R2241 Localized swelling, mass and lump, right lower limb: Secondary | ICD-10-CM | POA: Diagnosis not present

## 2017-02-25 DIAGNOSIS — M25561 Pain in right knee: Secondary | ICD-10-CM | POA: Diagnosis not present

## 2017-02-25 NOTE — Consult Note (Signed)
Chief Complaint: Patient was seen in consultation today for  Chief Complaint  Patient presents with  . Advice Only    Consult for Venous Insufficiency   at the request of Ganji,Jay  Referring Physician(s): Ganji,Jay  History of Present Illness: Drew Harrison is a 66 y.o. male who presents with a one-year history of increasing right knee pain. He denies any pain below the lower leg or ankle. Pain is constant and is worse with activity. He does not have a history of venous insufficiency. He does stand up for long periods of time for employment. He did undergo a CABG complicated by stroke earlier this year but has recovered completely. He thinks that the saphenous vein was harvested in his right leg. He denies any ulcers on his legs.  Past Medical History:  Diagnosis Date  . Abnormal nuclear stress test   . Arthritis   . Coronary artery disease    3-vessel  . Diabetes mellitus without complication ([place])   . GERD (gastroesophageal reflux disease)   . Gout   . Headache   . History of tobacco abuse   . Hyperlipidemia   . Hypertension   . Ischemic stroke ([place])   . Stroke ([place]) 03/29/2016   left facial droop  . Stroke due to embolism ([place])   . Visual disturbance     Past Surgical History:  Procedure Laterality Date  . CORONARY ARTERY BYPASS GRAFT N/A 06/17/2016   Procedure: CORONARY ARTERY BYPASS GRAFTING (CABG) x four using left internal mammary artery and right greater saphenous leg vein using endoscope.;  Surgeon: [name], MD;  Location: [facility];  Service: Open Heart Surgery;  Laterality: N/A;  . LEFT HEART CATH AND CORONARY ANGIOGRAPHY N/A 05/14/2016   Procedure: Left Heart Cath and Coronary Angiography;  Surgeon: Adrian Prows, MD;  Location: [facility];  Service: Cardiovascular;  Laterality: N/A;  . LOWER EXTREMITY ANGIOGRAPHY N/A 01/21/2017   Procedure: Lower Extremity Angiography;  Surgeon: Adrian Prows, MD;  Location: [facility];  Service:  Cardiovascular;  Laterality: N/A;  . TEE WITHOUT CARDIOVERSION N/A 06/17/2016   Procedure: TRANSESOPHAGEAL ECHOCARDIOGRAM (TEE);  Surgeon: [name], MD;  Location: [facility];  Service: Open Heart Surgery;  Laterality: N/A;  . TONSILLECTOMY      Allergies: Patient has no known allergies.  Medications: Prior to Admission medications   Medication Sig Start Date End Date Taking? Authorizing Provider  allopurinol (ZYLOPRIM) 100 MG tablet Take 200 mg by mouth daily.  02/06/16   [provider]  amLODipine (NORVASC) 10 MG tablet Take 10 mg by mouth daily.    [provider]  atorvastatin (LIPITOR) 40 MG tablet Take 1 tablet (40 mg total) by mouth daily at 6 PM. 03/30/16   Rosita Fire, MD  clopidogrel (PLAVIX) 75 MG tablet Take 75 mg by mouth daily.    [provider]  famotidine (PEPCID) 40 MG tablet Take 40 mg by mouth daily as needed for indigestion.     [provider]  insulin detemir (LEVEMIR) 100 UNIT/ML injection Inject 0.18 mLs (18 Units total) into the skin 2 (two) times daily. Patient taking differently: Inject 40 Units into the skin at bedtime.  06/26/16   Barrett, Erin R, PA-C  metFORMIN (GLUCOPHAGE) 500 MG tablet Take 1 tablet (500 mg total) by mouth 2 (two) times daily with a meal. 01/23/17   Adrian Prows, MD  metoprolol tartrate (LOPRESSOR) 25 MG tablet Take 1 tablet (25 mg total)  by mouth 2 (two) times daily. 06/26/16   Barrett, Erin R, PA-C  Na Sulfate-K Sulfate-Mg Sulf (SUPREP BOWEL PREP KIT) 17.5-3.13-1.6 GM/177ML SOLN suprep as directed.  No substitutions 02/05/17   Armbruster, Carlota Raspberry, MD  naproxen sodium (ANAPROX) 220 MG tablet Take 220 mg by mouth daily as needed (pain).    [provider]  olmesartan (BENICAR) 40 MG tablet Take 40 mg by mouth daily.    [provider]     Family History  Problem Relation Age of Onset  . Heart disease Father   . Colon cancer Neg Hx   . Esophageal cancer Neg Hx   . Rectal cancer  Neg Hx     Social History   Socioeconomic History  . Marital status: Divorced    Spouse name: Not on file  . Number of children: 1  . Years of education: Not on file  . Highest education level: Not on file  Social Needs  . Financial resource strain: Not on file  . Food insecurity - worry: Not on file  . Food insecurity - inability: Not on file  . Transportation needs - medical: Not on file  . Transportation needs - non-medical: Not on file  Occupational History  . Occupation: Financial trader: [employer]  Tobacco Use  . Smoking status: Former Smoker    Packs/day: 1.50    Years: 48.00    Pack years: 72.00    Types: Cigarettes    Last attempt to quit: 03/29/2016    Years since quitting: 0.9  . Smokeless tobacco: Never Used  Substance and Sexual Activity  . Alcohol use: Yes    Alcohol/week: 8.4 oz    Types: 14 Cans of beer per week  . Drug use: Yes    Types: Cocaine    Comment: last usage 12 yrs.  . Sexual activity: Not on file  Other Topics Concern  . Not on file  Social History Narrative  . Not on file     Review of Systems: A 12 point ROS discussed and pertinent positives are indicated in the HPI above.  All other systems are negative.  Review of Systems  Vital Signs: BP (!) 172/77   Pulse 75   Temp 98.1 F (36.7 C) (Oral)   Resp 14   Ht 6' (1.829 m)   Wt 212 lb (96.2 kg)   SpO2 98%   BMI 28.75 kg/m   Physical Exam  Constitutional: He is oriented to person, place, and time. He appears well-developed and well-nourished.  HENT:  Head: Normocephalic and atraumatic.  Musculoskeletal:  Examination of the right lower extremity demonstrates 1+ edema at the ankle. There is no skin breakdown. Dorsalis pedis pulse is 1+. Posterior tibial pulse is 2+. No visible varicosities are identified.  Neurological: He is oriented to person, place, and time.     Imaging: No results found.  A venous Doppler study was performed. There is no evidence of DVT and no  evidence of venous insufficiency.  Labs:  CBC: Recent Labs    06/20/16 0356 06/21/16 0538 06/22/16 0234 07/01/16  WBC 7.4 7.5 6.5 6.9  HGB 8.3* 8.7* 8.9* 8.0*  HCT 24.4* 26.1* 27.2* 25*  PLT 112* 137* 155 280    COAGS: Recent Labs    03/29/16 0859 06/06/16 0847 06/17/16 1753  INR 0.98 1.00 1.24  APTT 29 27 39*    BMP: Recent Labs    06/19/16 0355 06/19/16 1807 06/20/16 0356 06/21/16 7353 06/22/16 0234  07/01/16  NA 135 134* 133* 133* 136 141  K 3.8 3.7 3.2* 4.2 3.8 4.2  CL 102 99* 100* 98* 104  --   CO2 25  --  26 24 24   --   GLUCOSE 132* 162* 108* 183* 103*  --   BUN 17 19 19  24* 25* 14  CALCIUM 7.8*  --  7.5* 7.9* 7.9*  --   CREATININE 0.96 0.90 0.95 1.24 1.11 0.9  GFRNONAA >60  --  >60 59* >60  --   GFRAA >60  --  >60 >60 >60  --     LIVER FUNCTION TESTS: Recent Labs    03/29/16 0859 06/06/16 0847 06/06/16 1129 06/20/16 0356  BILITOT 0.6 1.0 1.0 1.1  AST 20 15 20 20   ALT 23 21 23 19   ALKPHOS 83 124 129* 63  PROT 7.5 6.4* 6.4* 4.8*  ALBUMIN 4.3 3.8 3.6 2.4*    TUMOR MARKERS: No results for input(s): AFPTM, CEA, CA199, CHROMGRNA in the last 8760 hours.  Assessment and Plan:  There is no evidence of venous insufficiency in the right lower extremity. He does have some edema and compression stockings were recommended and administered. No further workup regarding venous insufficiency is recommended at this time.  Thank you for this interesting consult.  I greatly enjoyed meeting Drew Harrison and look forward to participating in their care.  A copy of this report was sent to the requesting provider on this date.  Electronically Signed: Marcellene Shivley, ART A 02/25/2017, 10:37 AM   I spent a total of  30 Minutes   in face to face in clinical consultation, greater than 50% of which was counseling/coordinating care for lower extremity venous insufficiency.

## 2017-02-28 ENCOUNTER — Encounter: Payer: Self-pay | Admitting: Gastroenterology

## 2017-03-11 DIAGNOSIS — M25561 Pain in right knee: Secondary | ICD-10-CM | POA: Diagnosis not present

## 2017-03-17 DIAGNOSIS — M25561 Pain in right knee: Secondary | ICD-10-CM | POA: Diagnosis not present

## 2017-03-20 ENCOUNTER — Encounter: Payer: PPO | Admitting: Gastroenterology

## 2017-03-20 DIAGNOSIS — M25561 Pain in right knee: Secondary | ICD-10-CM | POA: Diagnosis not present

## 2017-04-09 ENCOUNTER — Encounter: Payer: PPO | Attending: Physician Assistant | Admitting: Nutrition

## 2017-04-09 VITALS — Wt 222.0 lb

## 2017-04-09 DIAGNOSIS — E118 Type 2 diabetes mellitus with unspecified complications: Secondary | ICD-10-CM

## 2017-04-09 DIAGNOSIS — Z713 Dietary counseling and surveillance: Secondary | ICD-10-CM | POA: Insufficient documentation

## 2017-04-09 DIAGNOSIS — E669 Obesity, unspecified: Secondary | ICD-10-CM

## 2017-04-09 DIAGNOSIS — E1151 Type 2 diabetes mellitus with diabetic peripheral angiopathy without gangrene: Secondary | ICD-10-CM | POA: Insufficient documentation

## 2017-04-09 DIAGNOSIS — E1165 Type 2 diabetes mellitus with hyperglycemia: Secondary | ICD-10-CM

## 2017-04-09 DIAGNOSIS — IMO0002 Reserved for concepts with insufficient information to code with codable children: Secondary | ICD-10-CM

## 2017-04-09 NOTE — Patient Instructions (Addendum)
Goals  Increase more water to  2-3 bottles per day Increase low carb veggies. Keep A1C to 6% or less Talk to MD about increasing Metformin to 1000 mg BID. Try to eat before 7 pm. Start walking or getting more exercise.

## 2017-04-09 NOTE — Progress Notes (Signed)
Medical Nutrition Therapy:  Appt start time: 930end time:  1000Assessment:  Primary concerns today: Diabetes Type 2.  Follow up. Meter and BS log brought in. Taking 20 units of Levemir once a day at night now. Still struggling with his job situation at [employer]. Very stressful.   Still struggling with eating later at night due to work schedule which impacts his blood sugars.  Gained about 10 lbs since last visit... About 20-25 lbs in the last year since he quit smoking. Meter downloaded and Avg is 150's. Only testing in am.  Not smoking anymore but back to drinking alcohol at night-beer and liquor.. Bs avg 120-200's in am.  Taking BS only in am.   Recommend to test in evenings some before he takes Levemir to evaluate evening BS control.   Diet needs more meal consistency of balanced meals and more lower carb vegetables. Not drinking much water at all.-1 bottle per day. No new labs. Gets them done in next 2 weeks with PCP -Anderson [facility] Couillard..  Is on 500 mg of Metformin twice a day and would benefit to taking 1000 mg BID with good kidney function.      Needs to eat three balanced meals per day. Would benefit from stopping alcohol. Would benefit from meds for depression or anger/anxiety as he appears always angry and frustrated.   A1C 6.1%  10/25/16   CMP Latest Ref Rng & Units 07/01/2016 06/22/2016 06/21/2016  Glucose 65 - 99 mg/dL - 103(H) 183(H)  BUN 4 - 21 mg/dL 14 25(H) 24(H)  Creatinine 0.6 - 1.3 mg/dL 0.9 1.11 1.24  Sodium 137 - 147 mmol/L 141 136 133(L)  Potassium 3.4 - 5.3 mmol/L 4.2 3.8 4.2  Chloride 101 - 111 mmol/L - 104 98(L)  CO2 22 - 32 mmol/L - 24 24  Calcium 8.9 - 10.3 mg/dL - 7.9(L) 7.9(L)  Total Protein 6.5 - 8.1 g/dL - - -  Total Bilirubin 0.3 - 1.2 mg/dL - - -  Alkaline Phos 38 - 126 U/L - - -  AST 15 - 41 U/L - - -  ALT 17 - 63 U/L - - -          Preferred Learning Style:   No preference indicated   Learning Readiness:  Ready  Change in progress   MEDICATIONS:  See list   DIETARY INTAKE:  B) Skips often or eats a bowl of Special K Cereal L) 1/2 of a sandwich, water Dinner-meat and some vegetables or whatever he feels like.   Usual physical activity: ADL  Estimated energy needs: 2000  calories 225 g carbohydrates 150 g protein 56 g fat  Progress Towards Goal(s):  In progress.   Nutritional Diagnosis:  NB-1.1 Food and nutrition-related knowledge deficit As related to Diabetes.  As evidenced by A1C 12.2%..    Intervention:  Reviewed benefits of healthy eating of fresh fruits, vegetables and whole grains to improve heart function and healing from surgeries and reducing complications from DM.  Heart Healthy Diet, Low Fat High Fiber    Goals  Increase more water to  2-3 bottles per day Increase low carb veggies. Keep A1C to 6% or less Talk to MD about increasing Metformin to 1000 mg BID. Try to eat before 7 pm. Start walking or getting more exercise.  Teaching Method Utilized:  Visual Auditory Hands on  Handouts given during visit include:  The Plate Method  Nutrition Therapy for CAGB and DM  Meal Plan Card    Barriers to learning/adherence  to lifestyle change: none  Demonstrated degree of understanding via:  Teach Back   Monitoring/Evaluation:  Dietary intake, exercise, meal planning, SBG, and body weight in 3 month(s).  Needs to eat three balanced meals per day. Would benefit from stopping alcohol. Would benefit from meds for depression or anger/anxiety as he appears always angry and frustrated.      May consider [place] or [place] for longer acting long acting insulin its not a cost difference on insurance. Recommend to check Vit B 12 levels due to being on Metformin.

## 2017-04-10 ENCOUNTER — Encounter: Payer: Self-pay | Admitting: Nutrition

## 2017-04-15 DIAGNOSIS — I6521 Occlusion and stenosis of right carotid artery: Secondary | ICD-10-CM | POA: Diagnosis not present

## 2017-04-22 DIAGNOSIS — H90A11 Conductive hearing loss, unilateral, right ear with restricted hearing on the contralateral side: Secondary | ICD-10-CM | POA: Diagnosis not present

## 2017-05-01 DIAGNOSIS — E782 Mixed hyperlipidemia: Secondary | ICD-10-CM | POA: Diagnosis not present

## 2017-05-01 DIAGNOSIS — M1A9XX0 Chronic gout, unspecified, without tophus (tophi): Secondary | ICD-10-CM | POA: Diagnosis not present

## 2017-05-01 DIAGNOSIS — E119 Type 2 diabetes mellitus without complications: Secondary | ICD-10-CM | POA: Diagnosis not present

## 2017-05-01 DIAGNOSIS — I10 Essential (primary) hypertension: Secondary | ICD-10-CM | POA: Diagnosis not present

## 2017-05-09 ENCOUNTER — Ambulatory Visit (HOSPITAL_COMMUNITY)
Admission: EM | Admit: 2017-05-09 | Discharge: 2017-05-09 | Disposition: A | Discharge status: Home / Self Care | Payer: PPO | Attending: Family Medicine | Admitting: Family Medicine

## 2017-05-09 ENCOUNTER — Encounter (HOSPITAL_COMMUNITY): Payer: Self-pay | Admitting: Family Medicine

## 2017-05-09 DIAGNOSIS — J40 Bronchitis, not specified as acute or chronic: Secondary | ICD-10-CM | POA: Diagnosis not present

## 2017-05-09 MED ORDER — AZITHROMYCIN 250 MG PO TABS: 250.0000 mg | ORAL_TABLET | Freq: Every day | ORAL | 0 refills | Status: DC

## 2017-05-09 MED ORDER — HYDROCODONE-HOMATROPINE 5-1.5 MG/5ML PO SYRP: 5.0000 mL | ORAL_SOLUTION | Freq: Four times a day (QID) | ORAL | 0 refills | Status: DC | PRN

## 2017-05-09 NOTE — Discharge Instructions (Signed)
Return if you run a fever or the cough worsens.

## 2017-05-09 NOTE — ED Triage Notes (Signed)
Pt here for 2 days of cough, sore throat, bilateral ear pain. Using OTC meds for symptoms.

## 2017-05-09 NOTE — ED Provider Notes (Signed)
[facility]   [id number] 05/09/17 Arrival Time: 1331   SUBJECTIVE:  Drew Harrison is a 67 y.o. male who presents to the urgent care with complaint of 2 days of cough, sore throat, bilateral ear pain. Using OTC meds for symptoms.   Works at [employer] 5 years ago   Past Medical History:  Diagnosis Date  . Abnormal nuclear stress test   . Arthritis   . Coronary artery disease    3-vessel  . Diabetes mellitus without complication ([place])   . GERD (gastroesophageal reflux disease)   . Gout   . Headache   . History of tobacco abuse   . Hyperlipidemia   . Hypertension   . Ischemic stroke ([place])   . Stroke ([place]) 03/29/2016   left facial droop  . 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 Visual disturbance    Family History  Problem Relation Age of Onset  . Heart disease Father   . Colon cancer Neg Hx   . Esophageal cancer Neg Hx   . Rectal cancer Neg Hx    Social History   Socioeconomic History  . Marital status: Divorced    Spouse name: Not on file  . Number of children: 1  . Years of education: Not on file  . Highest education level: Not on file  Social Needs  . Financial resource strain: Not on file  . Food insecurity - worry: Not on file  . Food insecurity - inability: Not on file  . Transportation needs - medical: Not on file  . Transportation needs - non-medical: Not on file  Occupational History  . Occupation: Financial trader: [employer]  Tobacco Use  . Smoking status: Former Smoker    Packs/day: 1.50    Years: 48.00    Pack years: 72.00    Types: Cigarettes    Last attempt to quit: 03/29/2016    Years since quitting: 1.1  . Smokeless tobacco: Never Used  Substance and Sexual Activity  . Alcohol use: Yes    Alcohol/week: 8.4 oz    Types: 14 Cans of beer per week  . Drug use: Yes    Types: Cocaine    Comment: last usage 12 yrs.  . Sexual activity: Not on file  Other Topics Concern  . Not on file  Social History Narrative  .  Not on file   Current Facility-Administered Medications for the 05/09/17 encounter [facility] Encounter)  Medication  . 0.9 %  sodium chloride infusion   No outpatient medications have been marked as taking for the 05/09/17 encounter [facility] Encounter).   No Known Allergies    ROS: As per HPI, remainder of ROS negative.   OBJECTIVE:   Vitals:   05/09/17 1448  BP: (!) 160/80  Pulse: 87  Resp: 18  Temp: 98.3 F (36.8 C)  SpO2: 98%     General appearance: alert; no distress Eyes: PERRL; EOMI; conjunctiva normal HENT: normocephalic; atraumatic; TMs normal, canal normal, external ears normal without trauma; nasal mucosa normal; oral mucosa normal Neck: supple Lungs: clear to auscultation bilaterally Heart: regular rate and rhythm  Back: no CVA tenderness Extremities: no cyanosis or edema; symmetrical with no gross deformities Skin: warm and dry Neurologic: normal gait; grossly normal Psychological: alert and cooperative; normal mood and affect      Labs:  Results for orders placed or performed during the hospital encounter of 01/21/17  Glucose, capillary  Result Value Ref Range   Glucose-Capillary 111 (  H) 65 - 99 mg/dL  Glucose, capillary  Result Value Ref Range   Glucose-Capillary 109 (H) 65 - 99 mg/dL    Labs Reviewed - No data to display  No results found.     ASSESSMENT & PLAN:  1. Bronchitis     Meds ordered this encounter  Medications  . azithromycin (ZITHROMAX) 250 MG tablet    Sig: Take 1 tablet (250 mg total) by mouth daily. Take first 2 tablets together, then 1 every day until finished.    Dispense:  6 tablet    Refill:  0  . HYDROcodone-homatropine (HYDROMET) 5-1.5 MG/5ML syrup    Sig: Take 5 mLs by mouth every 6 (six) hours as needed for cough.    Dispense:  60 mL    Refill:  0    Reviewed expectations re: course of current medical issues. Questions answered. Outlined signs and symptoms indicating need for more acute  intervention. Patient verbalized understanding. After Visit Summary given.    Procedures:      Robyn Haber, MD 05/09/17 1523

## 2017-05-14 DIAGNOSIS — I10 Essential (primary) hypertension: Secondary | ICD-10-CM | POA: Diagnosis not present

## 2017-05-14 DIAGNOSIS — Z23 Encounter for immunization: Secondary | ICD-10-CM | POA: Diagnosis not present

## 2017-05-14 DIAGNOSIS — E782 Mixed hyperlipidemia: Secondary | ICD-10-CM | POA: Diagnosis not present

## 2017-05-14 DIAGNOSIS — E119 Type 2 diabetes mellitus without complications: Secondary | ICD-10-CM | POA: Diagnosis not present

## 2017-05-14 DIAGNOSIS — M1A9XX0 Chronic gout, unspecified, without tophus (tophi): Secondary | ICD-10-CM | POA: Diagnosis not present

## 2017-05-23 DIAGNOSIS — L02519 Cutaneous abscess of unspecified hand: Secondary | ICD-10-CM

## 2017-05-23 HISTORY — DX: Cutaneous abscess of unspecified hand: L02.519

## 2017-06-17 ENCOUNTER — Emergency Department (HOSPITAL_COMMUNITY): Payer: PPO

## 2017-06-17 ENCOUNTER — Inpatient Hospital Stay (HOSPITAL_COMMUNITY)
Admission: EM | Admit: 2017-06-17 | Discharge: 2017-06-19 | DRG: 603 | Disposition: A | Discharge status: Home / Self Care | Payer: PPO | Attending: Internal Medicine | Admitting: Internal Medicine

## 2017-06-17 ENCOUNTER — Encounter (HOSPITAL_COMMUNITY): Payer: Self-pay | Admitting: Cardiology

## 2017-06-17 DIAGNOSIS — M19041 Primary osteoarthritis, right hand: Secondary | ICD-10-CM | POA: Diagnosis not present

## 2017-06-17 DIAGNOSIS — K219 Gastro-esophageal reflux disease without esophagitis: Secondary | ICD-10-CM | POA: Diagnosis not present

## 2017-06-17 DIAGNOSIS — M7989 Other specified soft tissue disorders: Secondary | ICD-10-CM | POA: Diagnosis not present

## 2017-06-17 DIAGNOSIS — Z7902 Long term (current) use of antithrombotics/antiplatelets: Secondary | ICD-10-CM | POA: Diagnosis not present

## 2017-06-17 DIAGNOSIS — E11628 Type 2 diabetes mellitus with other skin complications: Secondary | ICD-10-CM

## 2017-06-17 DIAGNOSIS — Z8673 Personal history of transient ischemic attack (TIA), and cerebral infarction without residual deficits: Secondary | ICD-10-CM | POA: Diagnosis not present

## 2017-06-17 DIAGNOSIS — M109 Gout, unspecified: Secondary | ICD-10-CM | POA: Diagnosis not present

## 2017-06-17 DIAGNOSIS — E785 Hyperlipidemia, unspecified: Secondary | ICD-10-CM | POA: Diagnosis present

## 2017-06-17 DIAGNOSIS — E119 Type 2 diabetes mellitus without complications: Secondary | ICD-10-CM

## 2017-06-17 DIAGNOSIS — I10 Essential (primary) hypertension: Secondary | ICD-10-CM | POA: Diagnosis present

## 2017-06-17 DIAGNOSIS — Z8249 Family history of ischemic heart disease and other diseases of the circulatory system: Secondary | ICD-10-CM | POA: Diagnosis not present

## 2017-06-17 DIAGNOSIS — M79641 Pain in right hand: Secondary | ICD-10-CM | POA: Diagnosis not present

## 2017-06-17 DIAGNOSIS — Z794 Long term (current) use of insulin: Secondary | ICD-10-CM | POA: Diagnosis not present

## 2017-06-17 DIAGNOSIS — E1151 Type 2 diabetes mellitus with diabetic peripheral angiopathy without gangrene: Secondary | ICD-10-CM | POA: Diagnosis present

## 2017-06-17 DIAGNOSIS — I639 Cerebral infarction, unspecified: Secondary | ICD-10-CM | POA: Diagnosis present

## 2017-06-17 DIAGNOSIS — Z951 Presence of aortocoronary bypass graft: Secondary | ICD-10-CM

## 2017-06-17 DIAGNOSIS — Z87891 Personal history of nicotine dependence: Secondary | ICD-10-CM

## 2017-06-17 DIAGNOSIS — Z7982 Long term (current) use of aspirin: Secondary | ICD-10-CM | POA: Diagnosis not present

## 2017-06-17 DIAGNOSIS — I2510 Atherosclerotic heart disease of native coronary artery without angina pectoris: Secondary | ICD-10-CM | POA: Diagnosis present

## 2017-06-17 DIAGNOSIS — L03011 Cellulitis of right finger: Secondary | ICD-10-CM | POA: Diagnosis not present

## 2017-06-17 DIAGNOSIS — I739 Peripheral vascular disease, unspecified: Secondary | ICD-10-CM | POA: Diagnosis present

## 2017-06-17 DIAGNOSIS — M79644 Pain in right finger(s): Secondary | ICD-10-CM | POA: Diagnosis not present

## 2017-06-17 DIAGNOSIS — L03113 Cellulitis of right upper limb: Secondary | ICD-10-CM

## 2017-06-17 DIAGNOSIS — M659 Synovitis and tenosynovitis, unspecified: Secondary | ICD-10-CM | POA: Diagnosis not present

## 2017-06-17 DIAGNOSIS — L0390 Cellulitis, unspecified: Secondary | ICD-10-CM

## 2017-06-17 DIAGNOSIS — L02511 Cutaneous abscess of right hand: Secondary | ICD-10-CM | POA: Diagnosis not present

## 2017-06-17 HISTORY — DX: Cutaneous abscess of unspecified hand: L02.519

## 2017-06-17 HISTORY — DX: Cellulitis of unspecified part of limb: L03.119

## 2017-06-17 LAB — CBC WITH DIFFERENTIAL/PLATELET
Basophils Absolute: 0 10*3/uL (ref 0.0–0.1)
Basophils Relative: 0 %
Eosinophils Absolute: 0.3 10*3/uL (ref 0.0–0.7)
Eosinophils Relative: 4 %
HCT: 39.5 % (ref 39.0–52.0)
Hemoglobin: 13.3 g/dL (ref 13.0–17.0)
Lymphocytes Relative: 23 %
Lymphs Abs: 1.7 10*3/uL (ref 0.7–4.0)
MCH: 28.9 pg (ref 26.0–34.0)
MCHC: 33.7 g/dL (ref 30.0–36.0)
MCV: 85.9 fL (ref 78.0–100.0)
Monocytes Absolute: 0.8 10*3/uL (ref 0.1–1.0)
Monocytes Relative: 11 %
Neutro Abs: 4.7 10*3/uL (ref 1.7–7.7)
Neutrophils Relative %: 62 %
Platelets: 171 10*3/uL (ref 150–400)
RBC: 4.6 MIL/uL (ref 4.22–5.81)
RDW: 13.5 % (ref 11.5–15.5)
WBC: 7.5 10*3/uL (ref 4.0–10.5)

## 2017-06-17 LAB — BASIC METABOLIC PANEL
Anion gap: 11 (ref 5–15)
BUN: 17 mg/dL (ref 6–20)
CO2: 23 mmol/L (ref 22–32)
Calcium: 9.5 mg/dL (ref 8.9–10.3)
Chloride: 106 mmol/L (ref 101–111)
Creatinine, Ser: 0.98 mg/dL (ref 0.61–1.24)
GFR calc Af Amer: >60 mL/min (ref >60)
GFR calc non Af Amer: >60 mL/min (ref >60)
Glucose, Bld: 118 mg/dL — ABNORMAL HIGH (ref 65–99)
Potassium: 3.9 mmol/L (ref 3.5–5.1)
Sodium: 140 mmol/L (ref 135–145)

## 2017-06-17 MED ORDER — ATORVASTATIN CALCIUM 40 MG PO TABS
40.0000 mg | ORAL_TABLET | Freq: Every day | ORAL | Status: DC
  Administered 2017-06-18 – 2017-06-19 (×2): 40 mg via ORAL
  Filled 2017-06-17 (×2): qty 1

## 2017-06-17 MED ORDER — INSULIN ASPART 100 UNIT/ML ~~LOC~~ SOLN
0.0000 [IU] | SUBCUTANEOUS | Status: DC
  Administered 2017-06-18 (×3): 1 [IU] via SUBCUTANEOUS

## 2017-06-17 MED ORDER — ONDANSETRON HCL 4 MG PO TABS: 4.0000 mg | ORAL_TABLET | Freq: Four times a day (QID) | ORAL | Status: DC | PRN

## 2017-06-17 MED ORDER — ALLOPURINOL 100 MG PO TABS
200.0000 mg | ORAL_TABLET | Freq: Every day | ORAL | Status: DC
  Administered 2017-06-18 – 2017-06-19 (×2): 200 mg via ORAL
  Filled 2017-06-17 (×2): qty 2

## 2017-06-17 MED ORDER — AMLODIPINE BESYLATE 10 MG PO TABS
10.0000 mg | ORAL_TABLET | Freq: Every day | ORAL | Status: DC
  Administered 2017-06-18 – 2017-06-19 (×2): 10 mg via ORAL
  Filled 2017-06-17 (×2): qty 1

## 2017-06-17 MED ORDER — ASPIRIN 81 MG PO CHEW
81.0000 mg | CHEWABLE_TABLET | Freq: Every day | ORAL | Status: DC
Start: 2017-06-18 — End: 2017-06-19
  Administered 2017-06-18 – 2017-06-19 (×2): 81 mg via ORAL
  Filled 2017-06-17 (×2): qty 1

## 2017-06-17 MED ORDER — SODIUM CHLORIDE 0.9 % IV SOLN: 250.0000 mL | INTRAVENOUS | Status: DC | PRN

## 2017-06-17 MED ORDER — ENOXAPARIN SODIUM 60 MG/0.6ML ~~LOC~~ SOLN
50.0000 mg | SUBCUTANEOUS | Status: DC
  Administered 2017-06-18 (×2): 50 mg via SUBCUTANEOUS
  Filled 2017-06-17 (×2): qty 0.6

## 2017-06-17 MED ORDER — IRBESARTAN 300 MG PO TABS
300.0000 mg | ORAL_TABLET | Freq: Every day | ORAL | Status: DC
  Administered 2017-06-18 – 2017-06-19 (×2): 300 mg via ORAL
  Filled 2017-06-17 (×2): qty 1

## 2017-06-17 MED ORDER — FAMOTIDINE 20 MG PO TABS: 40.0000 mg | ORAL_TABLET | Freq: Every day | ORAL | Status: DC | PRN

## 2017-06-17 MED ORDER — METOPROLOL TARTRATE 25 MG PO TABS
25.0000 mg | ORAL_TABLET | Freq: Two times a day (BID) | ORAL | Status: DC
  Administered 2017-06-18 – 2017-06-19 (×4): 25 mg via ORAL
  Filled 2017-06-17 (×4): qty 1

## 2017-06-17 MED ORDER — VANCOMYCIN HCL IN DEXTROSE 1-5 GM/200ML-% IV SOLN
1000.0000 mg | Freq: Two times a day (BID) | INTRAVENOUS | Status: DC
  Administered 2017-06-18 – 2017-06-19 (×3): 1000 mg via INTRAVENOUS
  Filled 2017-06-17 (×4): qty 200

## 2017-06-17 MED ORDER — INSULIN DETEMIR 100 UNIT/ML ~~LOC~~ SOLN
20.0000 [IU] | Freq: Every day | SUBCUTANEOUS | Status: DC
  Administered 2017-06-18: 20 [IU] via SUBCUTANEOUS
  Administered 2017-06-18: 10 [IU] via SUBCUTANEOUS
  Filled 2017-06-17 (×4): qty 0.2

## 2017-06-17 MED ORDER — SODIUM CHLORIDE 0.9% FLUSH
3.0000 mL | Freq: Two times a day (BID) | INTRAVENOUS | Status: DC
  Administered 2017-06-18 – 2017-06-19 (×4): 3 mL via INTRAVENOUS

## 2017-06-17 MED ORDER — METFORMIN HCL 500 MG PO TABS
500.0000 mg | ORAL_TABLET | Freq: Two times a day (BID) | ORAL | Status: DC
  Administered 2017-06-18 – 2017-06-19 (×3): 500 mg via ORAL
  Filled 2017-06-17 (×4): qty 1

## 2017-06-17 MED ORDER — SODIUM CHLORIDE 0.9% FLUSH: 3.0000 mL | INTRAVENOUS | Status: DC | PRN

## 2017-06-17 MED ORDER — NAPROXEN SODIUM 275 MG PO TABS
275.0000 mg | ORAL_TABLET | Freq: Every day | ORAL | Status: DC | PRN
  Filled 2017-06-17: qty 1

## 2017-06-17 MED ORDER — VANCOMYCIN HCL IN DEXTROSE 1-5 GM/200ML-% IV SOLN
1000.0000 mg | Freq: Once | INTRAVENOUS | Status: AC
  Administered 2017-06-17: 1000 mg via INTRAVENOUS
  Filled 2017-06-17: qty 200

## 2017-06-17 MED ORDER — ONDANSETRON HCL 4 MG/2ML IJ SOLN: 4.0000 mg | Freq: Four times a day (QID) | INTRAMUSCULAR | Status: DC | PRN

## 2017-06-17 MED ORDER — ACETAMINOPHEN 325 MG PO TABS: 650.0000 mg | ORAL_TABLET | Freq: Four times a day (QID) | ORAL | Status: DC | PRN

## 2017-06-17 MED ORDER — ACETAMINOPHEN 650 MG RE SUPP: 650.0000 mg | Freq: Four times a day (QID) | RECTAL | Status: DC | PRN

## 2017-06-17 NOTE — ED Triage Notes (Signed)
Red hand swelling and redness times 2 days.  Sent here by PCP.  Treated with 1 gram Rocephin IM in the office.

## 2017-06-17 NOTE — Progress Notes (Signed)
Pharmacy Antibiotic Note  Drew Harrison is a 67 y.o. male admitted on 06/17/2017 with cellulitis.  Pharmacy has been consulted for vancomycin dosing.  Presenting with R index finger pain and swelling - received a dose of ceftriaxone at urgent care prior to coming to ED due to severity. Scr 0.98 (CrCl 91 mL/min). WBC WNL. Afebrile.   Plan: Vancomycin 1000 mg IV every 12 hours.  Goal trough 10-15 mcg/mL.  Monitor labs, c/s results, and trough as appropriate.  Height: 6' (182.9 cm) Weight: 223 lb (101.2 kg) IBW/kg (Calculated) : 77.6  Temp (24hrs), Avg:98.3 F (36.8 C), Min:98.3 F (36.8 C), Max:98.3 F (36.8 C)  Recent Labs  Lab 06/17/17 2012  WBC 7.5  CREATININE 0.98    Estimated Creatinine Clearance: 91.2 mL/min (by C-G formula based on SCr of 0.98 mg/dL).    No Known Allergies  Antimicrobials this admission: Vancomycin 3/26 >>   Dose adjustments this admission: N/A  Microbiology results: N/A  Thank you for allowing pharmacy to be a part of this patient's care.  Margot Ables, PharmD Clinical Pharmacist 06/17/2017 10:28 PM

## 2017-06-17 NOTE — H&P (Signed)
History and Physical    Drew Harrison ZPH:[id number] DOB: Jun 18, 1950 DOA: 06/17/2017  PCP: [place]   Patient coming from: Home  Chief Complaint: Right index finger pain and swelling  HPI: Drew Harrison is a 67 y.o. male with medical history significant for peripheral arterial disease, CAD with prior CABG, prior CVA, dyslipidemia, hypertension, and insulin-dependent diabetes mellitus who presents to the emergency department with spontaneous onset erythema, swelling, and pain on the index finger of his right hand over his PIP and MCP joints.  He states that the erythema has progressed towards the second MCP over the last several hours.  He initially went to urgent care where he received a shot of Rocephin and was told to come to the ED for further evaluation due to the severity.  Patient denies any particular injury and does not specifically state any aggravating or alleviating factors. He denies any fevers or chills.   ED Course: Vital signs are noted to be stable with elevated blood pressure of 174/92 as he has not taken his home medications today.  Laboratory data is otherwise unremarkable with glucose of 118.  Right hand x-ray demonstrates soft tissue swelling with no signs of osteomyelitis.  Review of Systems: All others reviewed and otherwise negative.  Past Medical History:  Diagnosis Date  . Abnormal nuclear stress test   . Arthritis   . Coronary artery disease    3-vessel  . Diabetes mellitus without complication ([place])   . GERD (gastroesophageal reflux disease)   . Gout   . Headache   . History of tobacco abuse   . Hyperlipidemia   . Hypertension   . Ischemic stroke ([place])   . Stroke ([place]) 03/29/2016   left facial droop  . Stroke due to embolism ([place])   . Visual disturbance     Past Surgical History:  Procedure Laterality Date  . CORONARY ARTERY BYPASS GRAFT N/A 06/17/2016   Procedure: CORONARY ARTERY BYPASS GRAFTING (CABG) x four  using left internal mammary artery and right greater saphenous leg vein using endoscope.;  Surgeon: [name], MD;  Location: [facility];  Service: Open Heart Surgery;  Laterality: N/A;  . LEFT HEART CATH AND CORONARY ANGIOGRAPHY N/A 05/14/2016   Procedure: Left Heart Cath and Coronary Angiography;  Surgeon: Adrian Prows, MD;  Location: [facility];  Service: Cardiovascular;  Laterality: N/A;  . LOWER EXTREMITY ANGIOGRAPHY N/A 01/21/2017   Procedure: Lower Extremity Angiography;  Surgeon: Adrian Prows, MD;  Location: [facility];  Service: Cardiovascular;  Laterality: N/A;  . TEE WITHOUT CARDIOVERSION N/A 06/17/2016   Procedure: TRANSESOPHAGEAL ECHOCARDIOGRAM (TEE);  Surgeon: [name], MD;  Location: [facility];  Service: Open Heart Surgery;  Laterality: N/A;  . TONSILLECTOMY       reports that he quit smoking about 14 months ago. His smoking use included cigarettes. He has a 72.00 pack-year smoking history. He has never used smokeless tobacco. He reports that he drinks about 8.4 oz of alcohol per week. He reports that he has current or past drug history. Drug: Cocaine.  No Known Allergies  Family History  Problem Relation Age of Onset  . Heart disease Father   . Colon cancer Neg Hx   . Esophageal cancer Neg Hx   . Rectal cancer Neg Hx     Prior to Admission medications   Medication Sig Start Date End Date Taking? Authorizing Provider  allopurinol (ZYLOPRIM) 100 MG tablet Take 200 mg by mouth  daily.  02/06/16   [provider]  amLODipine (NORVASC) 10 MG tablet Take 10 mg by mouth daily.    [provider]  aspirin 81 MG tablet Take 81 mg by mouth daily.    [provider]  atorvastatin (LIPITOR) 40 MG tablet Take 1 tablet (40 mg total) by mouth daily at 6 PM. 03/30/16   Rosita Fire, MD  azithromycin (ZITHROMAX) 250 MG tablet Take 1 tablet (250 mg total) by mouth daily. Take first 2 tablets together, then 1 every day until finished. 05/09/17    Robyn Haber, MD  clopidogrel (PLAVIX) 75 MG tablet Take 75 mg by mouth daily.    [provider]  famotidine (PEPCID) 40 MG tablet Take 40 mg by mouth daily as needed for indigestion.     [provider]  HYDROcodone-homatropine (HYDROMET) 5-1.5 MG/5ML syrup Take 5 mLs by mouth every 6 (six) hours as needed for cough. 05/09/17   Robyn Haber, MD  insulin detemir (LEVEMIR) 100 UNIT/ML injection Inject 0.18 mLs (18 Units total) into the skin 2 (two) times daily. Patient taking differently: Inject 40 Units into the skin at bedtime.  06/26/16   Barrett, Erin R, PA-C  metFORMIN (GLUCOPHAGE) 500 MG tablet Take 1 tablet (500 mg total) by mouth 2 (two) times daily with a meal. 01/23/17   Adrian Prows, MD  metoprolol tartrate (LOPRESSOR) 25 MG tablet Take 1 tablet (25 mg total) by mouth 2 (two) times daily. 06/26/16   Barrett, Erin R, PA-C  Na Sulfate-K Sulfate-Mg Sulf (SUPREP BOWEL PREP KIT) 17.5-3.13-1.6 GM/177ML SOLN suprep as directed.  No substitutions Patient not taking: Reported on 02/25/2017 02/05/17   Yetta Flock, MD  naproxen sodium (ANAPROX) 220 MG tablet Take 220 mg by mouth daily as needed (pain).    [provider]  olmesartan (BENICAR) 40 MG tablet Take 40 mg by mouth daily.    [provider]    Physical Exam: Vitals:   06/17/17 1709 06/17/17 1711  BP:  (!) 174/92  Pulse:  84  Resp:  16  Temp:  98.3 F (36.8 C)  TempSrc:  Oral  SpO2:  96%  Weight: 101.2 kg (223 lb)   Height: 6' (1.829 m)     Constitutional: NAD, calm, comfortable Vitals:   06/17/17 1709 06/17/17 1711  BP:  (!) 174/92  Pulse:  84  Resp:  16  Temp:  98.3 F (36.8 C)  TempSrc:  Oral  SpO2:  96%  Weight: 101.2 kg (223 lb)   Height: 6' (1.829 m)    Eyes: lids and conjunctivae normal ENMT: Mucous membranes are moist.  Neck: normal, supple Respiratory: clear to auscultation bilaterally. Normal respiratory effort. No accessory muscle use.  Cardiovascular:  Regular rate and rhythm, no murmurs. No extremity edema. Abdomen: no tenderness, no distention. Bowel sounds positive.  Musculoskeletal: Right PIP and MCP with swelling and tenderness to palpation.  Erythema, mild with extension from first PIP to second MCP on right hand.  No abrasions or cuts noted. Psychiatric: Normal judgment and insight. Alert and oriented x 3. Normal mood.   Labs on Admission: I have personally reviewed following labs and imaging studies  CBC: Recent Labs  Lab 06/17/17 2012  WBC 7.5  NEUTROABS 4.7  HGB 13.3  HCT 39.5  MCV 85.9  PLT 161   Basic Metabolic Panel: Recent Labs  Lab 06/17/17 2012  NA 140  K 3.9  CL 106  CO2 23  GLUCOSE 118*  BUN 17  CREATININE 0.98  CALCIUM 9.5   GFR: Estimated Creatinine Clearance: 91.2 mL/min (by C-G formula based on SCr of 0.98 mg/dL). Liver Function Tests: No results for input(s): AST, ALT, ALKPHOS, BILITOT, PROT, ALBUMIN in the last 168 hours. No results for input(s): LIPASE, AMYLASE in the last 168 hours. No results for input(s): AMMONIA in the last 168 hours. Coagulation Profile: No results for input(s): INR, PROTIME in the last 168 hours. Cardiac Enzymes: No results for input(s): CKTOTAL, CKMB, CKMBINDEX, TROPONINI in the last 168 hours. BNP (last 3 results) No results for input(s): PROBNP in the last 8760 hours. HbA1C: No results for input(s): HGBA1C in the last 72 hours. CBG: No results for input(s): GLUCAP in the last 168 hours. Lipid Profile: No results for input(s): CHOL, HDL, LDLCALC, TRIG, CHOLHDL, LDLDIRECT in the last 72 hours. Thyroid Function Tests: No results for input(s): TSH, T4TOTAL, FREET4, T3FREE, THYROIDAB in the last 72 hours. Anemia Panel: No results for input(s): VITAMINB12, FOLATE, FERRITIN, TIBC, IRON, RETICCTPCT in the last 72 hours. Urine analysis:    Component Value Date/Time   COLORURINE YELLOW 06/06/2016 0848   APPEARANCEUR CLEAR 06/06/2016 0848   LABSPEC 1.028 06/06/2016  0848   PHURINE 6.0 06/06/2016 0848   GLUCOSEU >=500 (A) 06/06/2016 0848   HGBUR NEGATIVE 06/06/2016 0848   BILIRUBINUR NEGATIVE 06/06/2016 0848   KETONESUR 5 (A) 06/06/2016 0848   PROTEINUR NEGATIVE 06/06/2016 0848   NITRITE NEGATIVE 06/06/2016 0848   LEUKOCYTESUR NEGATIVE 06/06/2016 0848    Radiological Exams on Admission: Dg Hand Complete Right  Result Date: 06/17/2017 CLINICAL DATA:  Swelling, erythema and pain in the right second finger. No reported injury. EXAM: RIGHT HAND - COMPLETE 3+ VIEW COMPARISON:  None. FINDINGS: Soft tissue swelling throughout the right second finger, most prominent at the level of the PIP joint. No fracture or dislocation. No suspicious focal osseous lesions. Severe first carpometacarpal joint osteoarthritis. Moderate first through third metacarpophalangeal joint osteoarthritis. Mild osteoarthritis throughout the interphalangeal joints of the first through fifth fingers, most prominent in second and third DIP joints. No radiopaque foreign body. IMPRESSION: Soft tissue swelling in the right second finger. No specific radiographic findings of osteomyelitis. 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 Electronically Signed   By: Ilona Sorrel M.D.   On: 06/17/2017 20:41    Assessment/Plan Principal Problem:   Cellulitis Active Problems:   Cerebrovascular accident (CVA) ([place])   Ischemic stroke ([place])   Essential hypertension   3-vessel CAD   S/P CABG x 4   Claudication in peripheral vascular disease ([place])   Diabetes ([place])    1. Right hand cellulitis.  Place on IV vancomycin per hand surgery recommendations with transfer to [facility] for further evaluation by Dr. Amedeo Plenty as discussed with ED provider.  No signs of sepsis currently noted.  Will also obtain ESR and CRP.  We will also obtain blood cultures x2 before vancomycin started.  We will keep n.p.o. Except medications after midnight if patient transfers, in case operative intervention  is required. 2. Essential hypertension.  Resume home medications of amlodipine, metoprolol, and olmesartan.  Monitor closely. 3. Type 2 diabetes.  Continue home medications including long-acting insulin at half dose.  Sliding scale insulin coverage. 4. Prior CVA.  Continue statin and hold Plavix for now and continue aspirin. 5. CAD with prior CABG.  As noted above. 6. History of gout.  Continue allopurinol.  He does not appear to have an acute gout flare.   DVT prophylaxis: Lovenox Code Status:  Full Family Communication: None Disposition Plan: Empiric treatment of cellulitis with IV vancomycin with transfer to [facility] when beds available for evaluation by hand surgeon Dr. Amedeo Plenty. Consults called:Orthopedics Dr. Amedeo Plenty by EDP; please call when patient arrives at [facility] Admission status: Obs, med-surg   Rodena Goldmann DO [facility] Pager [phone]  If 7PM-7AM, please contact night-coverage [email or web address] Password [facility]  06/17/2017, 9:52 PM

## 2017-06-17 NOTE — ED Provider Notes (Signed)
[facility] EMERGENCY DEPARTMENT Provider Note   CSN: [id number] Arrival date & time: 06/17/17  1703     History   Chief Complaint Chief Complaint  Patient presents with  . Recurrent Skin Infections    HPI Drew Harrison is a 67 y.o. male.  The history is provided by the patient. No language interpreter was used.  Hand Pain  This is a new problem. The current episode started 2 days ago. The problem occurs constantly. The problem has been gradually worsening. Nothing aggravates the symptoms. Nothing relieves the symptoms. He has tried nothing for the symptoms. The treatment provided no relief.   Pt complains of pain in his right index finger. Pt reports decreased movement.  Pt was seen at Urgent care and sent here for evaluation. Pt was given a shot of antibiotics.  Pt denies any injury.   Past Medical History:  Diagnosis Date  . Abnormal nuclear stress test   . Arthritis   . Coronary artery disease    3-vessel  . Diabetes mellitus without complication ([place])   . GERD (gastroesophageal reflux disease)   . Gout   . Headache   . History of tobacco abuse   . Hyperlipidemia   . Hypertension   . Ischemic stroke ([place])   . Stroke ([place]) 03/29/2016   left facial droop  . Stroke due to embolism ([place])   . Visual disturbance     Patient Active Problem List   Diagnosis Date Noted  . Claudication in peripheral vascular disease ([place]) 01/19/2017  . Visual disturbance 06/26/2016  . Stroke due to embolism ([place]) 06/26/2016  . 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 3-vessel CAD 06/04/2016  . Abnormal nuclear stress test 04/08/2016  . Cerebrovascular accident (CVA) ([place]) 03/29/2016  . Ischemic stroke ([place]) 03/29/2016  . Essential hypertension   . H/O tobacco use, presenting hazards to health     Past Surgical History:  Procedure Laterality Date  . CORONARY ARTERY BYPASS GRAFT N/A 06/17/2016   Procedure: CORONARY ARTERY BYPASS GRAFTING (CABG) x four using left internal mammary artery and  right greater saphenous leg vein using endoscope.;  Surgeon: [name], MD;  Location: [facility];  Service: Open Heart Surgery;  Laterality: N/A;  . LEFT HEART CATH AND CORONARY ANGIOGRAPHY N/A 05/14/2016   Procedure: Left Heart Cath and Coronary Angiography;  Surgeon: Adrian Prows, MD;  Location: [place] [facility];  Service: Cardiovascular;  Laterality: N/A;  . LOWER EXTREMITY ANGIOGRAPHY N/A 01/21/2017   Procedure: Lower Extremity Angiography;  Surgeon: Adrian Prows, MD;  Location: [facility];  Service: Cardiovascular;  Laterality: N/A;  . TEE WITHOUT CARDIOVERSION N/A 06/17/2016   Procedure: TRANSESOPHAGEAL ECHOCARDIOGRAM (TEE);  Surgeon: [name], MD;  Location: [place];  Service: Open Heart Surgery;  Laterality: N/A;  . TONSILLECTOMY          Home Medications    Prior to Admission medications   Medication Sig Start Date End Date Taking? Authorizing Provider  allopurinol (ZYLOPRIM) 100 MG tablet Take 200 mg by mouth daily.  02/06/16   [provider]  amLODipine (NORVASC) 10 MG tablet Take 10 mg by mouth daily.    [provider]  aspirin 81 MG tablet Take 81 mg by mouth daily.    [provider]  atorvastatin (LIPITOR) 40 MG tablet Take 1 tablet (40 mg total) by mouth daily at 6 PM. 03/30/16   Rosita Fire, MD  azithromycin (ZITHROMAX) 250 MG tablet Take 1 tablet (  250 mg total) by mouth daily. Take first 2 tablets together, then 1 every day until finished. 05/09/17   Robyn Haber, MD  clopidogrel (PLAVIX) 75 MG tablet Take 75 mg by mouth daily.    [provider]  famotidine (PEPCID) 40 MG tablet Take 40 mg by mouth daily as needed for indigestion.     [provider]  HYDROcodone-homatropine (HYDROMET) 5-1.5 MG/5ML syrup Take 5 mLs by mouth every 6 (six) hours as needed for cough. 05/09/17   Robyn Haber, MD  insulin detemir (LEVEMIR) 100 UNIT/ML injection Inject 0.18 mLs (18 Units total) into the skin 2 (two) times  daily. Patient taking differently: Inject 40 Units into the skin at bedtime.  06/26/16   Barrett, Erin R, PA-C  metFORMIN (GLUCOPHAGE) 500 MG tablet Take 1 tablet (500 mg total) by mouth 2 (two) times daily with a meal. 01/23/17   Adrian Prows, MD  metoprolol tartrate (LOPRESSOR) 25 MG tablet Take 1 tablet (25 mg total) by mouth 2 (two) times daily. 06/26/16   Barrett, Erin R, PA-C  Na Sulfate-K Sulfate-Mg Sulf (SUPREP BOWEL PREP KIT) 17.5-3.13-1.6 GM/177ML SOLN suprep as directed.  No substitutions Patient not taking: Reported on 02/25/2017 02/05/17   Yetta Flock, MD  naproxen sodium (ANAPROX) 220 MG tablet Take 220 mg by mouth daily as needed (pain).    [provider]  olmesartan (BENICAR) 40 MG tablet Take 40 mg by mouth daily.    [provider]    Family History Family History  Problem Relation Age of Onset  . Heart disease Father   . Colon cancer Neg Hx   . Esophageal cancer Neg Hx   . Rectal cancer Neg Hx     Social History Social History   Tobacco Use  . Smoking status: Former Smoker    Packs/day: 1.50    Years: 48.00    Pack years: 72.00    Types: Cigarettes    Last attempt to quit: 03/29/2016    Years since quitting: 1.2  . Smokeless tobacco: Never Used  Substance Use Topics  . Alcohol use: Yes    Alcohol/week: 8.4 oz    Types: 14 Cans of beer per week  . Drug use: Yes    Types: Cocaine    Comment: last usage 12 yrs.     Allergies   Patient has no known allergies.   Review of Systems Review of Systems  Musculoskeletal: Positive for joint swelling.  All other systems reviewed and are negative.    Physical Exam Updated Vital Signs BP (!) 174/92   Pulse 84   Temp 98.3 F (36.8 C) (Oral)   Resp 16   Ht 6' (1.829 m)   Wt 101.2 kg (223 lb)   SpO2 96%   BMI 30.24 kg/m   Physical Exam  Constitutional: He appears well-developed and well-nourished.  HENT:  Head: Normocephalic.  Cardiovascular: Normal rate.  Pulmonary/Chest:  Effort normal.  Musculoskeletal: He exhibits tenderness.  Swollen tender right index finger,  Redness 4cm up into hand.  Tender to palpation   Neurological: He is alert.  Skin: Skin is warm.  Psychiatric: He has a normal mood and affect.  Nursing note and vitals reviewed.    ED Treatments / Results  Labs (all labs ordered are listed, but only abnormal results are displayed) Labs Reviewed  CBC WITH DIFFERENTIAL/PLATELET  BASIC METABOLIC PANEL    EKG None  Radiology Dg Hand Complete Right  Result Date: 06/17/2017 CLINICAL DATA:  Swelling, erythema  and pain in the right second finger. No reported injury. EXAM: RIGHT HAND - COMPLETE 3+ VIEW COMPARISON:  None. FINDINGS: Soft tissue swelling throughout the right second finger, most prominent at the level of the PIP joint. No fracture or dislocation. No suspicious focal osseous lesions. Severe first carpometacarpal joint osteoarthritis. Moderate first through third metacarpophalangeal joint osteoarthritis. Mild osteoarthritis throughout the interphalangeal joints of the first through fifth fingers, most prominent in second and third DIP joints. No radiopaque foreign body. IMPRESSION: Soft tissue swelling in the right second finger. No specific radiographic findings of osteomyelitis. 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 Electronically Signed   By: Ilona Sorrel M.D.   On: 06/17/2017 20:41    Procedures Procedures (including critical care time)  Medications Ordered in ED Medications - No data to display   Initial Impression / Assessment and Plan / ED Course  I have reviewed the triage vital signs and the nursing notes.  Pertinent labs & imaging results that were available during my care of the patient were reviewed by me and considered in my medical decision making (see chart for details).         Consult Dr. Amedeo Plenty.  He advised Iv vancomycin.  Hospitalist admission.  He will consult if transferred to  [facility].    Final Clinical Impressions(s) / ED Diagnoses   Final diagnoses:  Cellulitis of right hand    ED Discharge Orders    None       Sidney Ace 06/17/17 2132    Nat Christen, MD 06/19/17 1239

## 2017-06-18 ENCOUNTER — Encounter (HOSPITAL_COMMUNITY): Payer: Self-pay | Admitting: General Practice

## 2017-06-18 ENCOUNTER — Other Ambulatory Visit: Payer: Self-pay

## 2017-06-18 DIAGNOSIS — Z8673 Personal history of transient ischemic attack (TIA), and cerebral infarction without residual deficits: Secondary | ICD-10-CM | POA: Diagnosis not present

## 2017-06-18 DIAGNOSIS — K219 Gastro-esophageal reflux disease without esophagitis: Secondary | ICD-10-CM | POA: Diagnosis present

## 2017-06-18 DIAGNOSIS — Z7902 Long term (current) use of antithrombotics/antiplatelets: Secondary | ICD-10-CM | POA: Diagnosis not present

## 2017-06-18 DIAGNOSIS — L03113 Cellulitis of right upper limb: Principal | ICD-10-CM

## 2017-06-18 DIAGNOSIS — I639 Cerebral infarction, unspecified: Secondary | ICD-10-CM | POA: Diagnosis not present

## 2017-06-18 DIAGNOSIS — E1151 Type 2 diabetes mellitus with diabetic peripheral angiopathy without gangrene: Secondary | ICD-10-CM | POA: Diagnosis present

## 2017-06-18 DIAGNOSIS — I10 Essential (primary) hypertension: Secondary | ICD-10-CM

## 2017-06-18 DIAGNOSIS — M19041 Primary osteoarthritis, right hand: Secondary | ICD-10-CM | POA: Diagnosis present

## 2017-06-18 DIAGNOSIS — M79641 Pain in right hand: Secondary | ICD-10-CM | POA: Diagnosis present

## 2017-06-18 DIAGNOSIS — Z8249 Family history of ischemic heart disease and other diseases of the circulatory system: Secondary | ICD-10-CM | POA: Diagnosis not present

## 2017-06-18 DIAGNOSIS — Z7982 Long term (current) use of aspirin: Secondary | ICD-10-CM | POA: Diagnosis not present

## 2017-06-18 DIAGNOSIS — Z87891 Personal history of nicotine dependence: Secondary | ICD-10-CM | POA: Diagnosis not present

## 2017-06-18 DIAGNOSIS — I2510 Atherosclerotic heart disease of native coronary artery without angina pectoris: Secondary | ICD-10-CM | POA: Diagnosis present

## 2017-06-18 DIAGNOSIS — Z794 Long term (current) use of insulin: Secondary | ICD-10-CM | POA: Diagnosis not present

## 2017-06-18 DIAGNOSIS — E785 Hyperlipidemia, unspecified: Secondary | ICD-10-CM | POA: Diagnosis present

## 2017-06-18 DIAGNOSIS — E11628 Type 2 diabetes mellitus with other skin complications: Secondary | ICD-10-CM | POA: Diagnosis not present

## 2017-06-18 DIAGNOSIS — M109 Gout, unspecified: Secondary | ICD-10-CM | POA: Diagnosis present

## 2017-06-18 DIAGNOSIS — Z951 Presence of aortocoronary bypass graft: Secondary | ICD-10-CM | POA: Diagnosis not present

## 2017-06-18 LAB — GLUCOSE, CAPILLARY
Glucose-Capillary: 124 mg/dL — ABNORMAL HIGH (ref 65–99)
Glucose-Capillary: 126 mg/dL — ABNORMAL HIGH (ref 65–99)
Glucose-Capillary: 130 mg/dL — ABNORMAL HIGH (ref 65–99)
Glucose-Capillary: 142 mg/dL — ABNORMAL HIGH (ref 65–99)
Glucose-Capillary: 114 mg/dL — ABNORMAL HIGH (ref 65–99)

## 2017-06-18 LAB — SEDIMENTATION RATE: Sed Rate: 32 mm/hr — ABNORMAL HIGH (ref 0–16)

## 2017-06-18 LAB — MRSA PCR SCREENING: MRSA by PCR: NEGATIVE

## 2017-06-18 LAB — CBG MONITORING, ED: Glucose-Capillary: 120 mg/dL — ABNORMAL HIGH (ref 65–99)

## 2017-06-18 LAB — C-REACTIVE PROTEIN: CRP: 1 mg/dL — ABNORMAL HIGH (ref <1.0)

## 2017-06-18 MED ORDER — MUPIROCIN 2 % EX OINT
TOPICAL_OINTMENT | CUTANEOUS | Status: AC
  Administered 2017-06-18: 1
  Filled 2017-06-18: qty 22

## 2017-06-18 MED ORDER — COLCHICINE 0.6 MG PO TABS
0.6000 mg | ORAL_TABLET | Freq: Once | ORAL | Status: AC
  Administered 2017-06-18: 0.6 mg via ORAL
  Filled 2017-06-18: qty 1

## 2017-06-18 MED ORDER — PIPERACILLIN-TAZOBACTAM 3.375 G IVPB
3.3750 g | Freq: Three times a day (TID) | INTRAVENOUS | Status: DC
  Administered 2017-06-18 – 2017-06-19 (×3): 3.375 g via INTRAVENOUS
  Filled 2017-06-18 (×5): qty 50

## 2017-06-18 MED ORDER — COLCHICINE 0.6 MG PO TABS
1.2000 mg | ORAL_TABLET | Freq: Once | ORAL | Status: AC
  Administered 2017-06-18: 1.2 mg via ORAL
  Filled 2017-06-18: qty 2

## 2017-06-18 MED ORDER — COLCHICINE 0.6 MG PO TABS: 0.6000 mg | ORAL_TABLET | Freq: Two times a day (BID) | ORAL | Status: DC

## 2017-06-18 NOTE — Progress Notes (Addendum)
PROGRESS NOTE    Drew Harrison  QMV:[id number] DOB: January 10, 1951 DOA: 06/17/2017 PCP: [facility]   Outpatient Specialists:     Brief Narrative:  Drew Harrison is a 67 y.o. male with medical history significant for peripheral arterial disease, CAD with prior CABG, prior CVA, dyslipidemia, hypertension, and insulin-dependent diabetes mellitus who presents to the emergency department with spontaneous onset erythema, swelling, and pain on the index finger of his right hand over his PIP and MCP joints.  He states that the erythema has progressed towards the second MCP over the last several hours.  He initially went to urgent care where he received a shot of Rocephin and was told to come to the ED for further evaluation due to the severity.  Patient denies any particular injury and does not specifically state any aggravating or alleviating factors. He denies any fevers or chills.     Assessment & Plan:   Principal Problem:   Cellulitis Active Problems:   Cerebrovascular accident (CVA) ([place])   Ischemic stroke ([place])   Essential hypertension   3-vessel CAD   S/P CABG x 4   Claudication in peripheral vascular disease (HCC)   Diabetes ([place])   Right hand cellulitis vs gout -denies truama - IV vancomycin  -await further evaluation by Dr. Amedeo Plenty as discussed with ED provider -no worsening of the infection -add colchicine -add uric acid to prior labs  Essential hypertension.  Resume home medications of amlodipine, metoprolol, and olmesartan.  Monitor closely.  Type 2 diabetes -continue home meds    Prior CVA -plavix on hold per admitting MD  -resume once no procedure planned  CAD with prior CABG  History of gout.  Continue allopurinol  Osteoarthritis      DVT prophylaxis:  Lovenox   Code Status: Full Code   Family Communication:   Disposition Plan:     Consultants:   Gramig    Subjective: Thought he was being  transferred for surgical intervention this AM  Objective: Vitals:   06/18/17 0135 06/18/17 0243 06/18/17 0404 06/18/17 0953  BP: (!) 198/94 139/70 (!) 148/75 (!) 148/75  Pulse: 88  73 73  Resp:      Temp: 97.8 F (36.6 C)  97.8 F (36.6 C)   TempSrc: Oral  Oral   SpO2: 99%  95%   Weight:      Height:        Intake/Output Summary (Last 24 hours) at 06/18/2017 1250 Last data filed at 06/17/2017 2309 Gross per 24 hour  Intake 200 ml  Output -  Net 200 ml   Filed Weights   06/17/17 1709  Weight: 101.2 kg (223 lb)    Examination:  General exam: NAD Respiratory system: Clear to auscultation. Respiratory effort normal. Cardiovascular system: S1 & S2 heard, RRR. No JVD, murmurs, rubs, gallops or clicks. No pedal edema. Gastrointestinal system: Abdomen is nondistended, soft and nontender. No organomegaly or masses felt. Normal bowel sounds heard. Central nervous system: Alert and oriented. No focal neurological deficits. Extremities: Symmetric 5 x 5 power. Skin: right hand with swollen distal joint on 1st finger-- no streaking up arm Psychiatry: angry    Data Reviewed: I have personally reviewed following labs and imaging studies  CBC: Recent Labs  Lab 06/17/17 2012  WBC 7.5  NEUTROABS 4.7  HGB 13.3  HCT 39.5  MCV 85.9  PLT 284   Basic Metabolic Panel: Recent Labs  Lab 06/17/17 2012  NA 140  K 3.9  CL  106  CO2 23  GLUCOSE 118*  BUN 17  CREATININE 0.98  CALCIUM 9.5   GFR: Estimated Creatinine Clearance: 91.2 mL/min (by C-G formula based on SCr of 0.98 mg/dL). Liver Function Tests: No results for input(s): AST, ALT, ALKPHOS, BILITOT, PROT, ALBUMIN in the last 168 hours. No results for input(s): LIPASE, AMYLASE in the last 168 hours. No results for input(s): AMMONIA in the last 168 hours. Coagulation Profile: No results for input(s): INR, PROTIME in the last 168 hours. Cardiac Enzymes: No results for input(s): CKTOTAL, CKMB, CKMBINDEX, TROPONINI in the  last 168 hours. BNP (last 3 results) No results for input(s): PROBNP in the last 8760 hours. HbA1C: No results for input(s): HGBA1C in the last 72 hours. CBG: Recent Labs  Lab 06/18/17 0100 06/18/17 0246 06/18/17 0407 06/18/17 1132  GLUCAP 120* 124* 126* 130*   Lipid Profile: No results for input(s): CHOL, HDL, LDLCALC, TRIG, CHOLHDL, LDLDIRECT in the last 72 hours. Thyroid Function Tests: No results for input(s): TSH, T4TOTAL, FREET4, T3FREE, THYROIDAB in the last 72 hours. Anemia Panel: No results for input(s): VITAMINB12, FOLATE, FERRITIN, TIBC, IRON, RETICCTPCT in the last 72 hours. Urine analysis:    Component Value Date/Time   COLORURINE YELLOW 06/06/2016 0848   APPEARANCEUR CLEAR 06/06/2016 0848   LABSPEC 1.028 06/06/2016 0848   PHURINE 6.0 06/06/2016 0848   GLUCOSEU >=500 (A) 06/06/2016 0848   HGBUR NEGATIVE 06/06/2016 0848   BILIRUBINUR NEGATIVE 06/06/2016 0848   KETONESUR 5 (A) 06/06/2016 0848   PROTEINUR NEGATIVE 06/06/2016 0848   NITRITE NEGATIVE 06/06/2016 0848   LEUKOCYTESUR NEGATIVE 06/06/2016 0848    ) Recent Results (from the past 240 hour(s))  MRSA PCR Screening     Status: None   Collection Time: 06/18/17  2:16 AM  Result Value Ref Range Status   MRSA by PCR NEGATIVE NEGATIVE Final    Comment:        The GeneXpert MRSA Assay (FDA approved for NASAL specimens only), is one component of a comprehensive MRSA colonization surveillance program. It is not intended to diagnose MRSA infection nor to guide or monitor treatment for MRSA infections. Performed at [facility], [place] [address] [place], [place] [address]       Anti-infectives (From admission, onward)   Start     Dose/Rate Route Frequency Ordered Stop   06/18/17 1000  vancomycin (VANCOCIN) IVPB 1000 mg/200 mL premix     1,000 mg 200 mL/hr over 60 Minutes Intravenous Every 12 hours 06/17/17 2227     06/17/17 2145  vancomycin (VANCOCIN) IVPB 1000 mg/200 mL premix     1,000  mg 200 mL/hr over 60 Minutes Intravenous  Once 06/17/17 2130 06/17/17 2309       Radiology Studies: Dg Hand Complete Right  Result Date: 06/17/2017 CLINICAL DATA:  Swelling, erythema and pain in the right second finger. No reported injury. EXAM: RIGHT HAND - COMPLETE 3+ VIEW COMPARISON:  None. FINDINGS: Soft tissue swelling throughout the right second finger, most prominent at the level of the PIP joint. No fracture or dislocation. No suspicious focal osseous lesions. Severe first carpometacarpal joint osteoarthritis. Moderate first through third metacarpophalangeal joint osteoarthritis. Mild osteoarthritis throughout the interphalangeal joints of the first through fifth fingers, most prominent in second and third DIP joints. No radiopaque foreign body. IMPRESSION: Soft tissue swelling in the right second finger. No specific radiographic findings of osteomyelitis. 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 Electronically Signed   By: Janina Mayo.D.  On: 06/17/2017 20:41        Scheduled Meds: . allopurinol  200 mg Oral Daily  . amLODipine  10 mg Oral Daily  . aspirin  81 mg Oral Daily  . atorvastatin  40 mg Oral q1800  . enoxaparin (LOVENOX) injection  50 mg Subcutaneous Q24H  . insulin aspart  0-9 Units Subcutaneous Q4H  . insulin detemir  20 Units Subcutaneous QHS  . irbesartan  300 mg Oral Daily  . metFORMIN  500 mg Oral BID WC  . metoprolol tartrate  25 mg Oral BID  . sodium chloride flush  3 mL Intravenous Q12H   Continuous Infusions: . sodium chloride    . vancomycin Stopped (06/18/17 1225)     LOS: 0 days    Time spent: 25 min    Geradine Girt, DO [facility] Pager [phone]  If 7PM-7AM, please contact night-coverage [email or web address] Password [facility] 06/18/2017, 12:50 PM

## 2017-06-18 NOTE — ED Notes (Signed)
[facility] arrived to transport pt. VSS. Pt aware of transfer

## 2017-06-18 NOTE — ED Notes (Signed)
Report to [facility]

## 2017-06-18 NOTE — Progress Notes (Signed)
Pharmacy Antibiotic Note  Drew Harrison is a 67 y.o. male admitted on 06/17/2017 with cellulitis.  Pharmacy has been consulted for vancomycin dosing and now zosyn has been added.   Patient currently afebrile, wbc within normal limits. No culture data. Renal function normal, no dose adjustments needed, will use extended interval dosing of zosyn.   Plan: Add zosyn 3.375g IV q8 hours Continue Vancomycin 1000 mg IV every 12 hours.  Goal trough 10-15 mcg/mL.  Monitor labs, c/s results, and trough as appropriate.  Height: 6' (182.9 cm) Weight: 223 lb (101.2 kg) IBW/kg (Calculated) : 77.6  Temp (24hrs), Avg:97.8 F (36.6 C), Min:97.7 F (36.5 C), Max:97.8 F (36.6 C)  Recent Labs  Lab 06/17/17 2012  WBC 7.5  CREATININE 0.98    Estimated Creatinine Clearance: 91.2 mL/min (by C-G formula based on SCr of 0.98 mg/dL).    No Known Allergies  Antimicrobials this admission: Vancomycin 3/26 >>  Zosyn 3/27>>  Thank you for allowing pharmacy to be a part of this patient's care.  Erin Hearing PharmD., BCPS Clinical Pharmacist 06/18/2017 6:05 PM

## 2017-06-18 NOTE — ED Notes (Signed)
Pt left with [facility] 

## 2017-06-18 NOTE — Progress Notes (Signed)
Pt's CBG was 120. [facility] was paged about 20 units of Levemir. 10 units was administered per MD verbal order.

## 2017-06-18 NOTE — Consult Note (Addendum)
Reason for Consult:right hand cellulitis Referring Physician: emergency room staff  Drew Harrison is an 67 y.o. male.  HPI: history of cellulitis over the last days. Patient presented to [facility] ER with cellulitis about his right hand involving the dorsal hand and index finger. He has been transferred to [facility]. I was informed about this transfer today. Last night and spoke with Forestine Na and there was difficulty in obtaining a bed at [facility]. He is currently on antibiotics. His erythema and cellulitis has resolved significantly based upon patient report and the pictures in the chart. He's  been administered vancomycin and colchicine  based upon cellulitis and potential for gout given his history of gouty flares  I've examined him bedside. I reviewed all issues.  He has a history of a chainsaw injury to the right index finger in the distant past.  He notes no locking popping catching he notes no numbness or tingling. He does have some pruritic about the finger  Past Medical History:  Diagnosis Date  . Abnormal nuclear stress test   . Arthritis   . Cellulitis and abscess of hand 05/2017  . Coronary artery disease    3-vessel  . Diabetes mellitus without complication ([place])   . GERD (gastroesophageal reflux disease)   . Gout   . Headache   . History of tobacco abuse   . Hyperlipidemia   . Hypertension   . Ischemic stroke ([place])   . Stroke ([place]) 03/29/2016   left facial droop  . 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 Visual disturbance     Past Surgical History:  Procedure Laterality Date  . CORONARY ARTERY BYPASS GRAFT N/A 06/17/2016   Procedure: CORONARY ARTERY BYPASS GRAFTING (CABG) x four using left internal mammary artery and right greater saphenous leg vein using endoscope.;  Surgeon: [name], MD;  Location: [facility];  Service: Open Heart Surgery;  Laterality: N/A;  . LEFT HEART CATH AND CORONARY ANGIOGRAPHY N/A 05/14/2016   Procedure: Left Heart Cath and Coronary  Angiography;  Surgeon: Adrian Prows, MD;  Location: [facility];  Service: Cardiovascular;  Laterality: N/A;  . LOWER EXTREMITY ANGIOGRAPHY N/A 01/21/2017   Procedure: Lower Extremity Angiography;  Surgeon: Adrian Prows, MD;  Location: [facility];  Service: Cardiovascular;  Laterality: N/A;  . TEE WITHOUT CARDIOVERSION N/A 06/17/2016   Procedure: TRANSESOPHAGEAL ECHOCARDIOGRAM (TEE);  Surgeon: [name], MD;  Location: [facility];  Service: Open Heart Surgery;  Laterality: N/A;  . TONSILLECTOMY      Family History  Problem Relation Age of Onset  . Heart disease Father   . Colon cancer Neg Hx   . Esophageal cancer Neg Hx   . Rectal cancer Neg Hx     Social History:  reports that he quit smoking about 14 months ago. His smoking use included cigarettes. He has a 72.00 pack-year smoking history. He has never used smokeless tobacco. He reports that he drinks about 8.4 oz of alcohol per week. He reports that he has current or past drug history. Drug: Cocaine.  Allergies: No Known Allergies  Medications: I have reviewed the patient's current medications.  Results for orders placed or performed during the hospital encounter of 06/17/17 (from the past 48 hour(s))  C-reactive protein     Status: Abnormal   Collection Time: 06/17/17  8:00 PM  Result Value Ref Range   CRP 1.0 (H) <1.0 mg/dL    Comment: Performed at [facility],  [address] [place], [place] [address]  CBC with Differential/Platelet     Status: None   Collection Time: 06/17/17  8:12 PM  Result Value Ref Range   WBC 7.5 4.0 - 10.5 K/uL   RBC 4.60 4.22 - 5.81 MIL/uL   Hemoglobin 13.3 13.0 - 17.0 g/dL   HCT 39.5 39.0 - 52.0 %   MCV 85.9 78.0 - 100.0 fL   MCH 28.9 26.0 - 34.0 pg   MCHC 33.7 30.0 - 36.0 g/dL   RDW 13.5 11.5 - 15.5 %   Platelets 171 150 - 400 K/uL   Neutrophils Relative % 62 %   Neutro Abs 4.7 1.7 - 7.7 K/uL   Lymphocytes Relative 23 %   Lymphs Abs 1.7 0.7 - 4.0 K/uL   Monocytes Relative  11 %   Monocytes Absolute 0.8 0.1 - 1.0 K/uL   Eosinophils Relative 4 %   Eosinophils Absolute 0.3 0.0 - 0.7 K/uL   Basophils Relative 0 %   Basophils Absolute 0.0 0.0 - 0.1 K/uL    Comment: Performed at [facility], [address] [place], [place] [address]  Basic metabolic panel     Status: Abnormal   Collection Time: 06/17/17  8:12 PM  Result Value Ref Range   Sodium 140 135 - 145 mmol/L   Potassium 3.9 3.5 - 5.1 mmol/L   Chloride 106 101 - 111 mmol/L   CO2 23 22 - 32 mmol/L   Glucose, Bld 118 (H) 65 - 99 mg/dL   BUN 17 6 - 20 mg/dL   Creatinine, Ser 0.98 0.61 - 1.24 mg/dL   Calcium 9.5 8.9 - 10.3 mg/dL   GFR calc non Af Amer >60 >60 mL/min   GFR calc Af Amer >60 >60 mL/min    Comment: (NOTE) The eGFR has been calculated using the CKD EPI equation. This calculation has not been validated in all clinical situations. eGFR's persistently <60 mL/min signify possible Chronic Kidney Disease.    Anion gap 11 5 - 15    Comment: Performed at [facility], [address], [place], [place] [address]  Sedimentation rate     Status: Abnormal   Collection Time: 06/17/17  8:23 PM  Result Value Ref Range   Sed Rate 32 (H) 0 - 16 mm/hr    Comment: Performed at [facility], [address], [place], [place] [address]  CBG monitoring, ED     Status: Abnormal   Collection Time: 06/18/17  1:00 AM  Result Value Ref Range   Glucose-Capillary 120 (H) 65 - 99 mg/dL  MRSA PCR Screening     Status: None   Collection Time: 06/18/17  2:16 AM  Result Value Ref Range   MRSA by PCR NEGATIVE NEGATIVE    Comment:        The GeneXpert MRSA Assay (FDA approved for NASAL specimens only), is one component of a comprehensive MRSA colonization surveillance program. It is not intended to diagnose MRSA infection nor to guide or monitor treatment for MRSA infections. Performed at [facility], [place] [address] [place], [place] [address]   Glucose, capillary     Status: Abnormal   Collection Time:  06/18/17  2:46 AM  Result Value Ref Range   Glucose-Capillary 124 (H) 65 - 99 mg/dL  Glucose, capillary     Status: Abnormal   Collection Time: 06/18/17  4:07 AM  Result Value Ref Range   Glucose-Capillary 126 (H) 65 - 99 mg/dL   Comment 1 Document  in Chart   Glucose, capillary     Status: Abnormal   Collection Time: 06/18/17 11:32 AM  Result Value Ref Range   Glucose-Capillary 130 (H) 65 - 99 mg/dL  Glucose, capillary     Status: Abnormal   Collection Time: 06/18/17  4:46 PM  Result Value Ref Range   Glucose-Capillary 142 (H) 65 - 99 mg/dL    Dg Hand Complete Right  Result Date: 06/17/2017 CLINICAL DATA:  Swelling, erythema and pain in the right second finger. No reported injury. EXAM: RIGHT HAND - COMPLETE 3+ VIEW COMPARISON:  None. FINDINGS: Soft tissue swelling throughout the right second finger, most prominent at the level of the PIP joint. No fracture or dislocation. No suspicious focal osseous lesions. Severe first carpometacarpal joint osteoarthritis. Moderate first through third metacarpophalangeal joint osteoarthritis. Mild osteoarthritis throughout the interphalangeal joints of the first through fifth fingers, most prominent in second and third DIP joints. No radiopaque foreign body. IMPRESSION: Soft tissue swelling in the right second finger. No specific radiographic findings of osteomyelitis. 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 Electronically Signed   By: Ilona Sorrel M.D.   On: 06/17/2017 20:41    Review of Systems  Respiratory: Negative.   Cardiovascular: Negative.   Gastrointestinal: Negative.   Genitourinary: Negative.    Blood pressure (!) 143/75, pulse 74, temperature 97.8 F (36.6 C), temperature source Oral, resp. rate 12, height 6' (1.829 m), weight 101.2 kg (223 lb), SpO2 96 %. Physical Exam Resolving cellulitis about the hand. He still is some cellulitic change of the PIP joint of his finger. Hehas some degree of loss of motion  due to prior chainsaw injury and we have reviewed this.  No evidence of instability no evidence of compartment syndrome or infectious fasciitis  Flexor and extensor tendons are stable. He is fairly nontender on palpation of the extensor and flexor apparatus.  No [place] signs Uric acid is pending at this time. White blood cell count is stable.  Opposite extremity is neurovascularly intact. There is no evidence of dystrophy or infection here.  I reviewed all issues with the patient length.  The patient is alert and oriented in no acute distress. The patient complains of pain in the affected upper extremity.  The patient is noted to have a normal HEENT exam. Lung fields show equal chest expansion and no shortness of breath. Abdomen exam is nontender without distention. Lower extremity examination does not show any fracture dislocation or blood clot symptoms. Pelvis is stable and the neck and back are stable and nontender. Assessment/Plan: Right hand cellulitis including index finger without obvious abscess or extensor or flexor tenosynovitis. I would recommend observation. I would continue antibiotics and treat the hand soft tissue infection.  Hopefully he will continue to improve and transition to oral antibiotics tomorrow. I would recommend cautious observation and discussed with the patient that occasionally an abscess can form more declare itself in the face of cellulitis have ride do not see any at this time  I would recommend continuing cultures seen as oftentimes we will see gout superimpose itself with cellulitic change. There is nothing wrong with treating both at this juncture. Certainly he is responding to the treatment.  No surgical intervention planned at this time.  I would add a dose overnight of Zosyn for broad-spectrum coverage  Willa Frater III 06/18/2017, 5:47 PM

## 2017-06-19 DIAGNOSIS — I639 Cerebral infarction, unspecified: Secondary | ICD-10-CM

## 2017-06-19 DIAGNOSIS — Z794 Long term (current) use of insulin: Secondary | ICD-10-CM

## 2017-06-19 DIAGNOSIS — E11628 Type 2 diabetes mellitus with other skin complications: Secondary | ICD-10-CM

## 2017-06-19 LAB — GLUCOSE, CAPILLARY
Glucose-Capillary: 114 mg/dL — ABNORMAL HIGH (ref 65–99)
Glucose-Capillary: 146 mg/dL — ABNORMAL HIGH (ref 65–99)
Glucose-Capillary: 113 mg/dL — ABNORMAL HIGH (ref 65–99)

## 2017-06-19 LAB — URIC ACID: Uric Acid, Serum: 5.4 mg/dL (ref 4.4–7.6)

## 2017-06-19 MED ORDER — INSULIN DETEMIR 100 UNIT/ML ~~LOC~~ SOLN: 40.0000 [IU] | Freq: Every day | SUBCUTANEOUS | Status: DC

## 2017-06-19 MED ORDER — SULFAMETHOXAZOLE-TRIMETHOPRIM 800-160 MG PO TABS: 1.0000 | ORAL_TABLET | Freq: Two times a day (BID) | ORAL | 0 refills | Status: DC

## 2017-06-19 MED ORDER — CEPHALEXIN 500 MG PO CAPS: 500.0000 mg | ORAL_CAPSULE | Freq: Three times a day (TID) | ORAL | 0 refills | Status: AC

## 2017-06-19 MED ORDER — COLCHICINE 0.6 MG PO TABS: 0.6000 mg | ORAL_TABLET | Freq: Every day | ORAL | 0 refills | Status: DC

## 2017-06-19 MED ORDER — ASPIRIN 81 MG PO TABS: 81.0000 mg | ORAL_TABLET | Freq: Every day | ORAL | Status: DC

## 2017-06-19 MED ORDER — ACIDOPHILUS PO CAPS: 2.0000 | ORAL_CAPSULE | Freq: Every day | ORAL | 0 refills | Status: DC

## 2017-06-19 MED ORDER — COLCHICINE 0.6 MG PO TABS
0.6000 mg | ORAL_TABLET | Freq: Every day | ORAL | Status: DC
  Administered 2017-06-19: 0.6 mg via ORAL
  Filled 2017-06-19: qty 1

## 2017-06-19 MED ORDER — INSULIN ASPART 100 UNIT/ML ~~LOC~~ SOLN
0.0000 [IU] | Freq: Three times a day (TID) | SUBCUTANEOUS | Status: DC
  Administered 2017-06-19: 1 [IU] via SUBCUTANEOUS

## 2017-06-19 NOTE — Progress Notes (Signed)
Discharge: Pt d/c from room via wheelchair, Family member with the pt. Discharge instructions given to the patient and family members.  No questions from pt,no discomfort. Pt dressed in street clothes and left with discharge papers and prescriptions in hand. IV d/ced, no complaints of pain or discomfort.

## 2017-06-19 NOTE — Discharge Summary (Signed)
Physician Discharge Summary  Drew Harrison:[id number] DOB: 20-Sep-1950 DOA: 06/17/2017  PCP: [facility]  Admit date: 06/17/2017 Discharge date: 06/19/2017   Recommendations for Outpatient Follow-Up:   1. Follow up with Dr. Amedeo Plenty 2. Given 2 weeks of bactrim plus keflex 3. BMP 1 week   Discharge Diagnosis:   Principal Problem:   Cellulitis Active Problems:   Cerebrovascular accident (CVA) ([place])   Ischemic stroke ([place])   Essential hypertension   3-vessel CAD   S/P CABG x 4   Claudication in peripheral vascular disease ([place])   Diabetes ([place])   Discharge disposition:  Home.  Discharge Condition: Improved.  Diet recommendation: Low sodium, heart healthy.  Carbohydrate-modified  Wound care: None.   History of Present Illness:   Drew Harrison is a 67 y.o. male with medical history significant for peripheral arterial disease, CAD with prior CABG, prior CVA, dyslipidemia, hypertension, and insulin-dependent diabetes mellitus who presents to the emergency department with spontaneous onset erythema, swelling, and pain on the index finger of his right hand over his PIP and MCP joints.  He states that the erythema has progressed towards the second MCP over the last several hours.  He initially went to urgent care where he received a shot of Rocephin and was told to come to the ED for further evaluation due to the severity.  Patient denies any particular injury and does not specifically state any aggravating or alleviating factors. He denies any fevers or chills.     Hospital Course by Problem:   Right hand cellulitis vs gout -denies truama - IV abx converted to PO after discussion with Dr. Amedeo Plenty  -added colchicine x 5 days  Essential hypertension.  -resume home meds  Type 2 diabetes -continue home meds   Prior CVA -resume home meds  CAD with prior CABG  History of gout. Continue  allopurinol  Osteoarthritis      Medical Consultants:    Hand surgery   Discharge Exam:   Vitals:   06/18/17 2141 06/19/17 0657  BP: (!) 142/77 (!) 146/78  Pulse: 83 70  Resp: 14 16  Temp: 98.3 F (36.8 C) (!) 97.3 F (36.3 C)  SpO2: 93% 95%   Vitals:   06/18/17 1300 06/18/17 1648 06/18/17 2141 06/19/17 0657  BP: (!) 143/75 (!) 143/75 (!) 142/77 (!) 146/78  Pulse: 74  83 70  Resp: 12  14 16   Temp: 97.8 F (36.6 C)  98.3 F (36.8 C) (!) 97.3 F (36.3 C)  TempSrc: Oral  Oral Oral  SpO2: 96%  93% 95%  Weight:      Height:        Gen:  NAD-- up walking around room-- much improved  The results of significant diagnostics from this hospitalization (including imaging, microbiology, ancillary and laboratory) are listed below for reference.     Procedures and Diagnostic Studies:   Dg Hand Complete Right  Result Date: 06/17/2017 CLINICAL DATA:  Swelling, erythema and pain in the right second finger. No reported injury. EXAM: RIGHT HAND - COMPLETE 3+ VIEW COMPARISON:  None. FINDINGS: Soft tissue swelling throughout the right second finger, most prominent at the level of the PIP joint. No fracture or dislocation. No suspicious focal osseous lesions. Severe first carpometacarpal joint osteoarthritis. Moderate first through third metacarpophalangeal joint osteoarthritis. Mild osteoarthritis throughout the interphalangeal joints of the first through fifth fingers, most prominent in second and third DIP joints. No radiopaque foreign body. IMPRESSION: Soft tissue swelling in the right  second finger. No specific radiographic findings of osteomyelitis. 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 Electronically Signed   By: Ilona Sorrel M.D.   On: 06/17/2017 20:41     Labs:   Basic Metabolic Panel: Recent Labs  Lab 06/17/17 2012  NA 140  K 3.9  CL 106  CO2 23  GLUCOSE 118*  BUN 17  CREATININE 0.98  CALCIUM 9.5   GFR Estimated Creatinine  Clearance: 91.2 mL/min (by C-G formula based on SCr of 0.98 mg/dL). Liver Function Tests: No results for input(s): AST, ALT, ALKPHOS, BILITOT, PROT, ALBUMIN in the last 168 hours. No results for input(s): LIPASE, AMYLASE in the last 168 hours. No results for input(s): AMMONIA in the last 168 hours. Coagulation profile No results for input(s): INR, PROTIME in the last 168 hours.  CBC: Recent Labs  Lab 06/17/17 2012  WBC 7.5  NEUTROABS 4.7  HGB 13.3  HCT 39.5  MCV 85.9  PLT 171   Cardiac Enzymes: No results for input(s): CKTOTAL, CKMB, CKMBINDEX, TROPONINI in the last 168 hours. BNP: Invalid input(s): POCBNP CBG: Recent Labs  Lab 06/18/17 1132 06/18/17 1646 06/18/17 2119 06/19/17 0656 06/19/17 1143  GLUCAP 130* 142* 114* 114* 146*   D-Dimer No results for input(s): DDIMER in the last 72 hours. Hgb A1c No results for input(s): HGBA1C in the last 72 hours. Lipid Profile No results for input(s): CHOL, HDL, LDLCALC, TRIG, CHOLHDL, LDLDIRECT in the last 72 hours. Thyroid function studies No results for input(s): TSH, T4TOTAL, T3FREE, THYROIDAB in the last 72 hours.  Invalid input(s): FREET3 Anemia work up No results for input(s): VITAMINB12, FOLATE, FERRITIN, TIBC, IRON, RETICCTPCT in the last 72 hours. Microbiology Recent Results (from the past 240 hour(s))  MRSA PCR Screening     Status: None   Collection Time: 06/18/17  2:16 AM  Result Value Ref Range Status   MRSA by PCR NEGATIVE NEGATIVE Final    Comment:        The GeneXpert MRSA Assay (FDA approved for NASAL specimens only), is one component of a comprehensive MRSA colonization surveillance program. It is not intended to diagnose MRSA infection nor to guide or monitor treatment for MRSA infections. Performed at [facility], [place] [address], [place], [place] [address]      Discharge Instructions:   Discharge Instructions    Diet - low sodium heart healthy   Complete by:  As directed    Diet  Carb Modified   Complete by:  As directed    Discharge instructions   Complete by:  As directed    BMP 1 week with PCP -follow up with Dr. Amedeo Plenty   Increase activity slowly   Complete by:  As directed      Allergies as of 06/19/2017   No Known Allergies     Medication List    STOP taking these medications   cefTRIAXone 1 g injection Commonly known as:  ROCEPHIN     TAKE these medications   Acidophilus Caps capsule Take 2 capsules by mouth daily.   allopurinol 100 MG tablet Commonly known as:  ZYLOPRIM Take 200 mg by mouth daily.   amLODipine 10 MG tablet Commonly known as:  NORVASC Take 10 mg by mouth daily.   aspirin 81 MG tablet Take 1 tablet (81 mg total) by mouth daily.   atorvastatin 40 MG tablet Commonly known as:  LIPITOR Take 1 tablet (40 mg total) by mouth daily at 6 PM.   cephALEXin  500 MG capsule Commonly known as:  KEFLEX Take 1 capsule (500 mg total) by mouth 3 (three) times daily for 14 days.   clopidogrel 75 MG tablet Commonly known as:  PLAVIX Take 75 mg by mouth daily.   colchicine 0.6 MG tablet Take 1 tablet (0.6 mg total) by mouth daily for 4 doses. Start taking on:  06/20/2017   famotidine 40 MG tablet Commonly known as:  PEPCID Take 40 mg by mouth 2 (two) times daily.   insulin detemir 100 UNIT/ML injection Commonly known as:  LEVEMIR Inject 0.4 mLs (40 Units total) into the skin at bedtime.   losartan 100 MG tablet Commonly known as:  COZAAR Take 100 mg by mouth daily.   metFORMIN 500 MG tablet Commonly known as:  GLUCOPHAGE Take 1 tablet (500 mg total) by mouth 2 (two) times daily with a meal.   metoprolol tartrate 25 MG tablet Commonly known as:  LOPRESSOR Take 1 tablet (25 mg total) by mouth 2 (two) times daily.   naproxen sodium 220 MG tablet Commonly known as:  ALEVE Take 220 mg by mouth 2 (two) times daily as needed (pain).   sulfamethoxazole-trimethoprim 800-160 MG tablet Commonly known as:  BACTRIM DS Take 1  tablet by mouth 2 (two) times daily.      Follow-up Information    [place] Follow up in 1 week(s).   Specialty:  Family Medicine Why:  BMP Contact information: [address] [place] [address] [phone]        Roseanne Kaufman, MD Follow up.   Specialty:  Orthopedic Surgery Why:  as instructed Contact information: [address] [place] [address] [phone]            Time coordinating discharge: 35 min  Signed:  Geradine Girt   [facility] 06/19/2017, 12:20 PM

## 2017-06-19 NOTE — Consult Note (Signed)
  Patient is seen at bedside this morning patient notes no locking popping catching he has much improved parameters in terms of his erythema and cellulitis.   We discussed all issues.  At present time I discussed with primary care/hospitalist that I would recommend Bactrim DS 1 p.o. twice daily times 14 days and would recommend Keflex 500 mg 1 p.o. 4 times daily times 14 days.  I would take colchicine 1 tablet a day.  I would elevate keep the area clean and dry and return to see me next week if there are any problems.  I informed the patient I will be out of town Friday and Saturday and gave him my cellular phone number if he has any problems.  Overall I think he is getting much better and doing satisfactory for discharge later today.  I discussed this with he and all consultants.  It was a pleasure to see him today.  Once again I do not see any surgical needs at present time.  I feel that hopefully he will do quite well into the future.  My door is wide open should any issues arise.  Odena Mcquaid MD

## 2017-06-26 DIAGNOSIS — L03011 Cellulitis of right finger: Secondary | ICD-10-CM | POA: Diagnosis not present

## 2017-06-26 DIAGNOSIS — Z5181 Encounter for therapeutic drug level monitoring: Secondary | ICD-10-CM | POA: Diagnosis not present

## 2017-06-26 DIAGNOSIS — R7989 Other specified abnormal findings of blood chemistry: Secondary | ICD-10-CM | POA: Diagnosis not present

## 2017-07-15 DIAGNOSIS — Z5181 Encounter for therapeutic drug level monitoring: Secondary | ICD-10-CM | POA: Diagnosis not present

## 2017-07-15 DIAGNOSIS — R7989 Other specified abnormal findings of blood chemistry: Secondary | ICD-10-CM | POA: Diagnosis not present

## 2017-07-22 DIAGNOSIS — H9011 Conductive hearing loss, unilateral, right ear, with unrestricted hearing on the contralateral side: Secondary | ICD-10-CM | POA: Diagnosis not present

## 2017-08-04 DIAGNOSIS — E1151 Type 2 diabetes mellitus with diabetic peripheral angiopathy without gangrene: Secondary | ICD-10-CM | POA: Diagnosis not present

## 2017-08-04 DIAGNOSIS — I2510 Atherosclerotic heart disease of native coronary artery without angina pectoris: Secondary | ICD-10-CM | POA: Diagnosis not present

## 2017-08-04 DIAGNOSIS — Z794 Long term (current) use of insulin: Secondary | ICD-10-CM | POA: Diagnosis not present

## 2017-08-04 DIAGNOSIS — I739 Peripheral vascular disease, unspecified: Secondary | ICD-10-CM | POA: Diagnosis not present

## 2017-08-14 ENCOUNTER — Encounter (HOSPITAL_COMMUNITY): Payer: Self-pay | Admitting: Emergency Medicine

## 2017-08-14 ENCOUNTER — Ambulatory Visit (HOSPITAL_COMMUNITY)
Admission: EM | Admit: 2017-08-14 | Discharge: 2017-08-14 | Disposition: A | Discharge status: Home / Self Care | Payer: PPO | Attending: Family Medicine | Admitting: Family Medicine

## 2017-08-14 DIAGNOSIS — S0510XA Contusion of eyeball and orbital tissues, unspecified eye, initial encounter: Secondary | ICD-10-CM

## 2017-08-14 NOTE — Discharge Instructions (Signed)
Ice to area Expect slow resolution

## 2017-08-14 NOTE — ED Triage Notes (Signed)
PT woke up with bruising around left eye this AM. No injury. No change in vision. PT takes blood thinners

## 2017-08-14 NOTE — ED Provider Notes (Signed)
[facility]    CSN: [id number] Arrival date & time: 08/14/17  1054     History   Chief Complaint Chief Complaint  Patient presents with  . Eye Problem    HPI Drew Harrison is a 67 y.o. male.   HPI  Patient is here to be evaluated for bruising around his left eye His chart is reviewed and I am aware of his multiple cardiovascular and cerebrovascular problems including peripheral vascular disease from years of tobacco abuse, hypertension, and high lipids.  He quit smoking a year ago and is scheduled to have stents placed in his legs for claudication next week. He takes Plavix daily.  He has had some bruising on the Plavix but not extensively.  He no longer takes an aspirin a day.  He is compliant with recommendations of his cardiologist and his vascular surgeon. He states that his vision is normal.  He woke up with a black eye.  He does not remember any trauma.  He is perfectly fine went to bed last night.  He states that he got up a couple of times to urinate.  He is certain that he did not fall or hit his eye.  Past Medical History:  Diagnosis Date  . Abnormal nuclear stress test   . Arthritis   . Cellulitis and abscess of hand 05/2017  . Coronary artery disease    3-vessel  . Diabetes mellitus without complication ([place])   . GERD (gastroesophageal reflux disease)   . Gout   . Headache   . History of tobacco abuse   . Hyperlipidemia   . Hypertension   . Ischemic stroke ([place])   . Stroke ([place]) 03/29/2016   left facial droop  . 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 Visual disturbance     Patient Active Problem List   Diagnosis Date Noted  . Diabetes ([place]) 06/17/2017  . Cellulitis 06/17/2017  . Claudication in peripheral vascular disease ([place]) 01/19/2017  . Visual disturbance 06/26/2016  . 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 3-vessel CAD 06/04/2016  . Abnormal nuclear stress test 04/08/2016  . Cerebrovascular accident (CVA)  ([place]) 03/29/2016  . Ischemic stroke ([place]) 03/29/2016  . Essential hypertension   . H/O tobacco use, presenting hazards to health     Past Surgical History:  Procedure Laterality Date  . CORONARY ARTERY BYPASS GRAFT N/A 06/17/2016   Procedure: CORONARY ARTERY BYPASS GRAFTING (CABG) x four using left internal mammary artery and right greater saphenous leg vein using endoscope.;  Surgeon: [name], MD;  Location: [facility];  Service: Open Heart Surgery;  Laterality: N/A;  . LEFT HEART CATH AND CORONARY ANGIOGRAPHY N/A 05/14/2016   Procedure: Left Heart Cath and Coronary Angiography;  Surgeon: Adrian Prows, MD;  Location: [facility];  Service: Cardiovascular;  Laterality: N/A;  . LOWER EXTREMITY ANGIOGRAPHY N/A 01/21/2017   Procedure: Lower Extremity Angiography;  Surgeon: Adrian Prows, MD;  Location: [facility];  Service: Cardiovascular;  Laterality: N/A;  . TEE WITHOUT CARDIOVERSION N/A 06/17/2016   Procedure: TRANSESOPHAGEAL ECHOCARDIOGRAM (TEE);  Surgeon: [name], MD;  Location: [facility];  Service: Open Heart Surgery;  Laterality: N/A;  . TONSILLECTOMY         Home Medications    Prior to Admission medications   Medication Sig Start Date End Date Taking? Authorizing Provider  allopurinol (ZYLOPRIM) 100 MG tablet Take 200 mg by mouth daily.  02/06/16   [provider]  amLODipine (NORVASC) 10 MG tablet Take 10 mg by mouth daily.    [provider]  atorvastatin (LIPITOR) 40 MG tablet Take 1 tablet (40 mg total) by mouth daily at 6 PM. 03/30/16   Rosita Fire, MD  clopidogrel (PLAVIX) 75 MG tablet Take 75 mg by mouth daily.    [provider]  famotidine (PEPCID) 40 MG tablet Take 40 mg by mouth 2 (two) times daily.     [provider]  insulin detemir (LEVEMIR) 100 UNIT/ML injection Inject 0.4 mLs (40 Units total) into the skin at bedtime. 06/19/17   Geradine Girt, DO  losartan (COZAAR) 100 MG tablet Take 100 mg by mouth  daily. 06/03/17   [provider]  metFORMIN (GLUCOPHAGE) 500 MG tablet Take 1 tablet (500 mg total) by mouth 2 (two) times daily with a meal. 01/23/17   Adrian Prows, MD  metoprolol tartrate (LOPRESSOR) 25 MG tablet Take 1 tablet (25 mg total) by mouth 2 (two) times daily. 06/26/16   Barrett, Lodema Hong, PA-C    Family History Family History  Problem Relation Age of Onset  . Heart disease Father   . Colon cancer Neg Hx   . Esophageal cancer Neg Hx   . Rectal cancer Neg Hx     Social History Social History   Tobacco Use  . Smoking status: Former Smoker    Packs/day: 1.50    Years: 48.00    Pack years: 72.00    Types: Cigarettes    Last attempt to quit: 03/29/2016    Years since quitting: 1.3  . Smokeless tobacco: Never Used  Substance Use Topics  . Alcohol use: Yes    Alcohol/week: 8.4 oz    Types: 14 Cans of beer per week  . Drug use: Yes    Types: Cocaine    Comment: last usage 12 yrs.     Allergies   Patient has no known allergies.   Review of Systems Review of Systems  Constitutional: Negative for chills and fever.  HENT: Negative for congestion and dental problem.   Eyes: Negative for pain, redness and visual disturbance.  Respiratory: Negative for cough and shortness of breath.   Cardiovascular: Negative for chest pain and palpitations.  Gastrointestinal: Negative for abdominal pain and vomiting.  Genitourinary: Negative for dysuria and hematuria.  Musculoskeletal: Positive for gait problem. Negative for arthralgias and back pain.       Claudication  Skin: Positive for color change. Negative for rash.  Neurological: Negative for dizziness and headaches.  All other systems reviewed and are negative.    Physical Exam Triage Vital Signs ED Triage Vitals [08/14/17 1140]  Enc Vitals Group     BP (!) 164/81     Pulse Rate 78     Resp 16     Temp 98.1 F (36.7 C)     Temp Source Oral     SpO2 98 %     Weight 220 lb (99.8 kg)     Height      Head  Circumference      Peak Flow      Pain Score 0     Pain Loc      Pain Edu?      Excl. in [place]?    No data found.  Updated Vital Signs BP (!) 164/81   Pulse 78   Temp 98.1 F (36.7 C) (Oral)   Resp 16   Wt 220  lb (99.8 kg)   SpO2 98%   BMI 29.84 kg/m   Visual Acuity Right Eye Distance:   Left Eye Distance:   Bilateral Distance:    Right Eye Near:   Left Eye Near:    Bilateral Near:     Physical Exam  Constitutional: He appears well-developed and well-nourished.  HENT:  Head: Normocephalic and atraumatic.  Mouth/Throat: Oropharynx is clear and moist.  Eyes: Pupils are equal, round, and reactive to light. Conjunctivae and EOM are normal. Right eye exhibits no discharge and no exudate. No foreign body present in the right eye. Left eye exhibits no discharge and no exudate. No foreign body present in the left eye.    Neck: Neck supple.  Cardiovascular: Normal rate and regular rhythm.  No murmur heard. Pulmonary/Chest: Effort normal and breath sounds normal. No respiratory distress.  Abdominal: Soft. He exhibits no distension.  Musculoskeletal: He exhibits no edema.  Neurological: He is alert.  Skin: Skin is warm and dry.  Psychiatric: He has a normal mood and affect.  Nursing note and vitals reviewed.    UC Treatments / Results  Labs (all labs ordered are listed, but only abnormal results are displayed) Labs Reviewed - No data to display  EKG None  Radiology No results found.  Procedures Procedures (including critical care time)  Medications Ordered in UC Medications - No data to display  Initial Impression / Assessment and Plan / UC Course  I have reviewed the triage vital signs and the nursing notes.  Pertinent labs & imaging results that were available during my care of the patient were reviewed by me and considered in my medical decision making (see chart for details).     Discussed with patient that the skin around the eye is quite fragile and  that he must have rubbed in his sleep.  With the Plavix he will have some slight increased bleeding tendency.  I do not suspect any damage to the eye or surrounding tissues.  This will resolve over time. Final Clinical Impressions(s) / UC Diagnoses   Final diagnoses:  Ecchymosis of eye, initial encounter     Discharge Instructions     Ice to area Expect slow resolution   ED Prescriptions    None     Controlled Substance Prescriptions [place] Controlled Substance Registry consulted? Not Applicable   Raylene Everts, MD 08/14/17 1620

## 2017-08-15 DIAGNOSIS — I739 Peripheral vascular disease, unspecified: Secondary | ICD-10-CM | POA: Diagnosis not present

## 2017-08-15 DIAGNOSIS — I2510 Atherosclerotic heart disease of native coronary artery without angina pectoris: Secondary | ICD-10-CM | POA: Diagnosis not present

## 2017-08-25 NOTE — H&P (Signed)
OFFICE VISIT NOTES COPIED TO EPIC FOR DOCUMENTATION  . History of Present Illness Drew Harrison; 08/04/2017 9:56 PM) Patient words: Last OV 01/31/2017; 6 month f/u PAD ([facility] hosp stay for cellulitis in March).  The patient is a 67 year old male who presents for a Follow-up for Coronary artery disease.  Additional reasons for visit:  Follow-up for Peripheral vascular disease is described as the following: Drew Harrison is a Caucasian male with CAD status post four-vessel CABG in 05/2016, post-op stroke with no significant residual neurodeficit, hypertension, type II diabetes mellitus, bilateral moderate carotid artery stenoses and severe PAD.  He continues to have leg swelling but has remained stable and has started to notice mild darm discolaration in his foot. He has noticed worsening left leg claudication, states it is affecting his work and lifestyle. No ulcer or cold feet and no rest pain. Otherwise, he denies any chest pain. shortness of breath, leg edema, orthopnea, PND, presncope, syncope, TIA. DM is now well controlled since he was admitted with right finger cellulitis in March 2019 at [facility] fo rthe fear of limb loss he has made lifestyle changes especially diet.   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 RCA ocluded. LAD 3 tandem prox to mid aneurysmal dilatation with 90% stenosis in the mid LAD. Smooth after stenosis. D1 prox 80%. Cx Occluded mid. RI-2 prox to mid 80%. Echocardiogram 03/30/2016: Normal LV systolic function EF 85-27%. Probable severe hypokinesis and  scarring in the basal inferolateral and inferior myocardium. Consider CAD. Grade 2 diastolic dysfunction. Mild left atrial enlargement. Lexiscan myoview stress test 04/08/2016: 1. Resting EKG 1 normal sinus rhythm, normal axis. Inferior infarct old, cannot exclude anterior infarct old. LVH, nonspecific T abnormality, cannot exclude inferior and lateral ischemia. Stress EKG is non-diagnostic for ischemia as it a pharmacologic stress using Lexiscan. 2. There is transient ischemic dilatationof the LV with left ventricular end-diastolic volume of 782 mL in stress images. There is a very large size severe defect in the inferior, inferolateral wall extending from the base towards the apex with mild to moderate amount of pre-infarct ischemia. Left ventricular systolic function calculated by QGS was markedly depressed at 34% with generalized hypokinesis and inferior and lateral akinesis.This is a high risk study, consider further cardiac work-up. Labwork  Labs 05/01/2017: A1c 6.5%. Potassium 4.9, serum glucose 123 mg, BUN 18, creatinine 1.2 to eGFR 61 mL, CMP normal. Total cholesterol 142, triglycerides 93, HDL 46, LDL 85. Non-HDL cholesterol 96. HB 13.9/HCT 41.8, normal indicis. 01/16/2017: Glucose 148, creatinine 1.16, EGFR 65/75, potassium 4.4, BMP normal. RDW 16%, CBC otherwise normal. INR 1.0, prothrombin time 10.5. Labs 06/22/2016: Total cholesterol 97, triglycerides 142, HDL 24, LDL 45. A1c 4.2%. Serum glucose 113 mg, potassium 3.8, BUN 25, creatinine 1.1, eGFR greater than 60 mL. HB 8.9/HCT 27.2, platelets 155. 05/08/2016: RBC 4.14, Hgb 12.5, Hct 35.5, Plts 124, CBC otherwise normal. TSH 4.8. Potassium 4.6, Creatinine 1.0, glucose 204, BMP normal. 05/07/70: Glucose 332, creatinine 0.93, sodium 129, potassium 4.7, BMP otherwise normal. Hemoglobin 12.7, hematocrit 36.7, no indices, platelets 130. INR 0.9, PT 9.5 03/30/2016: Cholesterol 222, triglycerides 187, HDL 47, LDL 138. A1c 6.9%.  Carotid stenosis, right (I65.21)   Carotid artery duplex 04/15/2017: Stenosis in the right internal carotid artery (50-69%) with diffuse soft plaque. Mild stenosis in the left common carotid artery (<50%) with diffuse soft plaque. Antegrade right vertebral artery flow. Antegrade left vertebral artery flow. Follow up in six months is appropriate if clinically indicated. No significant change from 10/17/2016. Uncontrolled type 2 diabetes mellitus with complication, with long-term current use of insulin (E11.8, E11.65)  Gastroesophageal reflux disease without esophagitis (K21.9)  Claudication (I73.9)  Peripheral arteriogram 01/21/2017: Mild diffuse disease in bilateral SFA. Left TP trunk 95% with one vessel runoff - PT, Peroneal reconsititutes immediately and AT at ankle. Right below knee 3 vessel r/u with severe diffuse disease. Leg edema, right (R60.0)  Lower extremity venous duplex 02/25/2017: There is no evidence of DVT or venous insufficiency in the right lower extremity. Subcutaneous edema is noted. History of cerebrovascular accident with residual effects (I69.90) [03/30/2016]: MRI Brain, Brain Stem 03/29/2016 Acute lacunar type infarct in the left lower brainstem at the pontomedullary junction. 2nd stroke after CABG Right cerebellar stroke on 06/21/2016. History of tobacco use disorder (O75.643) [03/30/2016]: Quit 03/30/2016: H/O four vessel coronary artery bypass graft (Z95.1) [06/17/2016]: CABG 06/17/2016: LIMA to LAD, SVG to D1, SVG to ramus intermediate, SVG to PDA by [name], Harrison Cellulitis (L03.90)  rt index finger  Allergies (Drew Harrison; Aug 11, 2017 8:52 AM) No Known Drug Allergies [04/04/2016]:  Family History (Drew Harrison; 2017-08-11 8:52 AM) Father  Deceased. at age 67, heart disease Mother  Living, no heart conditions Sister 1  Deceased. younger, aneurysm Brother 1  younger, no known heart conditions  Social History (Drew Harrison; Aug 11, 2017 8:52 AM) Marital status  Single. Number  of Children  1. Current tobacco use  Former smoker. quit 03/29/2016 Alcohol Use  Occasional alcohol use. Living Situation  Lives alone.  Past Surgical History (Drew Harrison; 2017-08-11 8:52 AM) Coronary Artery Bypass, Four [06/17/2016]: CABG 06/17/2016: LIMA to LAD, SVG to D1, SVG to ramus intermediate, SVG to PDA by [name], Harrison  Medication History (Drew Harrison; 2017/08/11 9:04 AM) Metoprolol Tartrate (25MG Tablet, 1 (one) T Tablet Tablet Oral two times daily, Taken starting 01/28/2017) Active. AmLODIPine Besylate (10MG Tablet, 1 (one) Tablet Tablet Tablet Oral daily, Taken starting 11/11/2016) Active. Clopidogrel Bisulfate (75MG Tablet, 1 (one) Tablet Tablet Tablet T Oral daily, Taken starting 04/26/2016) Active. Atorvastatin Calcium (40MG Tablet, 1 Tablet Tablet Tablet Tablet Oral daily, Taken starting 04/26/2016) Active. (Continuation of therapy) MetFORMIN HCl (500MG Tablet, 1 Oral two times daily) Active. Naproxen Sodium (220MG Tablet, 1 Oral as needed) Active. Allopurinol (100MG Tablet, 2 Oral daily) Active. Levemir FlexTouch (100UNIT/ML Soln Pen-inj, 40U Subcutaneous daily) Active. Losartan Potassium (100MG Tablet, 1 Oral daily) Active. Famotidine (40MG Tablet, 1 Oral two times daily) Active. Medications Reconciled (verbally with pt; list present)  Diagnostic Studies History Drew Page, Harrison; 08-11-17 9:40 AM) Carotid Doppler  04/15/2017: Stenosis in the right internal carotid artery (50-69%) with diffuse soft plaque. Mild stenosis in the left common carotid artery (<50%) with diffuse soft plaque. Antegrade right vertebral artery flow. Antegrade left vertebral artery flow. Follow up in six months is appropriate if clinically indicated. No significant change from 10/17/2016. Peripheral Arteriogram [01/21/2017]: Mild to moderate diffuse disease in bilateral SFA. Left TP trunk 95% with PT r/o, Peroneal reconsititutes immediately and AT at ankle.  Right below knee 3 vessel r/u with severe diffuse disease. Coronary Angiogram [05/13/2016]: Normal LVEF. RCA ocluded. LAD 3 tandem prox to mid aneurysmal dilatation with 90%  stenosis in the mid LAD. Smooth after stenosis. D1 prox 80%. Cx Occluded mid. RI-2 prox to mid 80%. Nuclear stress test [04/08/2016]: 1. Resting EKG 1 normal sinus rhythm, normal axis. Inferior infarct old, cannot exclude anterior infarct old. LVH, nonspecific T abnormality, cannot exclude inferior and lateral ischemia. Stress EKG is non-diagnostic for ischemia as it a pharmacologic stress using Lexiscan. 2. There is transient ischemic dilatationof the LV with left ventricular end-diastolic volume of 283 mL in stress images. There is a very large size severe defect in the inferior, inferolateral wall extending from the base towards the apex with mild to moderate amount of pre-infarct ischemia. Left ventricular systolic function calculated by QGS was markedly depressed at 34% with generalized hypokinesis and inferior and lateral akinesis.This is a high risk study, consider further cardiac work-up. Echocardiogram [03/29/2016]: Left ventricle:The cavity size was normal.Wall thickness was normal.Systolic function was normal.The estimated ejection fraction was in the range of 50% to 55%.Probable severe hypokinesis and scarring of the basalinferolateral and inferior myocardium;in the distribution of the right coronary or left circumflex coronary artery.Features are consistent with a pseudonormal left ventricular filling pattern,with concomitant abnormal relaxation and increasedfilling pressure(grade 2 diastolic dysfuntion).Mitral valve:There was mild to moderate regurgitation.Left Atrium:The Atrium was mildly dilated. Venous insufficiency study  Lower extremity venous insufficiency 02/25/2017: There is no evidence of DVT or venous insufficiency in the right lower extremity. Subcutaneous edema is noted.    Review of Systems Drew Harrison; 08/04/2017 9:56 PM) General Not Present- Anorexia, Fatigue and Fever. HEENT Not Present- Blurred Vision. Respiratory Present- Difficulty Breathing on Exertion. Not Present- Cough, Wakes up from Sleep Wheezing or Short of Breath and Wheezing. Cardiovascular Present- Claudications (bilateral calf, L>R) and Edema. Not Present- Orthopnea and Paroxysmal Nocturnal Dyspnea. Gastrointestinal Not Present- Black, Tarry Stool, Change in Bowel Habits and Nausea. Musculoskeletal Present- Joint Pain (Right knee pain). Neurological Not Present- Focal Neurological Symptoms and Syncope. Psychiatric Not Present- Hallucinations. Endocrine Not Present- Cold Intolerance, Excessive Sweating, Heat Intolerance and Thyroid Problems. Hematology Not Present- Anemia, Easy Bruising, Petechiae and Prolonged Bleeding. All other systems negative  Vitals (Drew Harrison; 08/04/2017 9:12 AM) 08/04/2017 8:58 AM Weight: 222.31 lb Height: 72in Body Surface Area: 2.23 m Body Mass Index: 30.15 kg/m  Pulse: 74 (Regular)  P.OX: 98% (Room air) BP: 152/78 (Sitting, Left Arm, Standard)       Physical Exam Drew Harrison; 08/04/2017 9:49 AM) General Mental Status-Alert. General Appearance-Cooperative and Appears stated age. Build & Nutrition-Well built and Well nourished.  Head and Neck Thyroid Gland Characteristics - normal size and consistency and no palpable nodules.  Chest and Lung Exam Chest and lung exam reveals -quiet, even and easy respiratory effort with no use of accessory muscles, non-tender and on auscultation, normal breath sounds, no adventitious sounds.  Cardiovascular Cardiovascular examination reveals -normal heart sounds, regular rate and rhythm with no murmurs.  Abdomen Palpation/Percussion Normal exam - Non Tender and No hepatosplenomegaly.  Peripheral Vascular Lower Extremity Inspection - Bilateral - Loss of hair and Pigmented, No Varicose veins. Palpation  - Temperature - Bilateral - Cool(toes). Edema - Bilateral - 1+ Pitting edema(ankle). Femoral pulse - Bilateral - Normal. Popliteal pulse - Bilateral - 1+. Dorsalis pedis pulse - Left - Absent. Right - 1+. Posterior tibial pulse - Bilateral - Absent. Carotid arteries Carotid arteries - Left-No Carotid bruit. Carotid arteries - Right-Soft Bruit. Abdomen-No prominent abdominal aortic pulsation, No epigastric bruit.  Neurologic Neurologic evaluation reveals -alert and oriented x 3 with no impairment of  recent or remote memory. Motor-Grossly intact without any focal deficits.  Musculoskeletal Global Assessment Left Lower Extremity - no deformities, masses or tenderness, no known fractures. Right Lower Extremity - no deformities, masses or tenderness, no known fractures.    Assessment & Plan Drew Harrison; 08/04/2017 9:57 PM) PAD (peripheral artery disease) (I73.9) Story: Lower extremity arterial duplex 12/18/2016: No hemodynamically significant stenoses are identified in the right lower extremity arterial system. There is <50% stenosis in right mid SFA. Moderate velocity increase at the left mid superficial femoral artery suggestive of > 50% stenosis. Left popliteal artery appears ectatic/aneurysmal and measured 1.0 cm. Heteregenous plaque. This exam reveals moderately decreased perfusion of the lower extremity, RABI 0.77 and LABI 0.60 noted at the post tibial artery level (ABI). Claudication (I73.9) Story: Peripheral arteriogram 01/21/2017: Mild diffuse disease in bilateral SFA. Left TP trunk 95% with one vessel runoff - PT, Peroneal reconsititutes immediately and AT reconstitutes at ankle. Right below knee 3 vessel r/u with severe diffuse disease. Future Plans 08/15/2017: ABI (ankle brachial index) (56387) - one time Atherosclerosis of native coronary artery of native heart without angina pectoris (I25.10) Story: CABG 06/17/2016: LIMA to LAD, SVG to D1, SVG to ramus  intermediate, SVG to PDA by [name], Harrison  Coronary angiogram 05/13/2016: Normal LVEF. RCA ocluded. LAD 3 tandem prox to mid aneurysmal dilatation with 90% stenosis in the mid LAD. Smooth after stenosis. D1 prox 80%. Cx Occluded mid. RI-2 prox to mid 80%.  Echocardiogram 03/30/2016: Normal LV systolic function EF 56-43%. Probable severe hypokinesis and scarring in the basal inferolateral and inferior myocardium. Consider CAD. Grade 2 diastolic dysfunction. Mild left atrial enlargement. Impression: EKG 08/04/2017: Normal sinus rhythm at rate of 71 bpm, inferior infarct old, anteroseptal infarct old. Nonspecific T abnormality. Compared to EKG 07/11/2016: LVH with repolarization abnormality not seen. Current Plans Complete electrocardiogram (93000) Controlled type 2 diabetes mellitus with diabetic peripheral angiopathy without gangrene, with long-term current use of insulin (E11.51) Carotid stenosis, right (I65.21) Story: Carotid artery duplex 04/15/2017: Stenosis in the right internal carotid artery (50-69%) with diffuse soft plaque. Mild stenosis in the left common carotid artery (<50%) with diffuse soft plaque. Antegrade right vertebral artery flow. Antegrade left vertebral artery flow. Follow up in six months is appropriate if clinically indicated. No significant change from 10/17/2016. Future Plans 10/16/2017: Complete duplex scan of bilateral carotid arteries (32951) - one time Labwork 08/19/2017: Glucose 121, creatinine 1.2, EGFR 61, potassium 4.6, BMP otherwise normal.  CBC normal.  Labs 05/01/2017: A1c 6.5%. Potassium 4.9, serum glucose 123 mg, BUN 18, creatinine 1.2 to eGFR 61 mL, CMP normal. Total cholesterol 142, triglycerides 93, HDL 46, LDL 85. Non-HDL cholesterol 96. HB 13.9/HCT 41.8, normal indicis.  01/16/2017: Glucose 148, creatinine 1.16, EGFR 65/75, potassium 4.4, BMP normal. RDW 16%, CBC otherwise normal. INR 1.0, prothrombin time 10.5.  Labs 06/22/2016: Total cholesterol  97, triglycerides 142, HDL 24, LDL 45. A1c 4.2%. Serum glucose 113 mg, potassium 3.8, BUN 25, creatinine 1.1, eGFR greater than 60 mL. HB 8.9/HCT 27.2, platelets 155.  05/08/2016: RBC 4.14, Hgb 12.5, Hct 35.5, Plts 124, CBC otherwise normal. TSH 4.8. Potassium 4.6, Creatinine 1.0, glucose 204, BMP normal.  05/07/70: Glucose 332, creatinine 0.93, sodium 129, potassium 4.7, BMP otherwise normal. Hemoglobin 12.7, hematocrit 36.7, no indices, platelets 130. INR 0.9, PT 9.5  03/30/2016: Cholesterol 222, triglycerides 187, HDL 47, LDL 138. A1c 6.9%.  Note:Recommendation:  Drew Harrison is a Caucasian male with CAD status post four-vessel CABG in 05/2016, post-op  stroke with no significant residual neurodeficit, hypertension, type II diabetes mellitus, bilateral moderate carotid artery stenoses and severe small vessel PAD.  He continues to have leg swelling that he notices after standing or walking for a long time at work and associated leg cramps. He works at [employer] and his job demands long hours of standing and walking. He also has claudication left leg and now states that it has become lifestyle limiting.  Peripheral angiogram showed one-vessel runoff on the left in the form of posterior tibial artery, the tibioperoneal trunk has a high-grade 95% stenosis which is calcific, he is certainly at risk for critical limb ischemia. I will obtain ABI to re-establish baseline and schedule him for peripheral arteriogram.  Otherwise, he denies any chest pain. shortness of breath, leg edema, orthopnea, PND, presncope, syncope, TIA. Continue carotid surveillance. DM is now well controlled and he has made dietary changes for the fear of limb loss.  Cc Dewayne Shorter, PA-C  Signed by Drew Page, Harrison (08/04/2017 9:59 PM)

## 2017-08-26 ENCOUNTER — Encounter (HOSPITAL_COMMUNITY): Payer: Self-pay | Admitting: Cardiology

## 2017-08-26 ENCOUNTER — Ambulatory Visit (HOSPITAL_COMMUNITY)
Admission: RE | Disposition: A | Discharge status: Home / Self Care | Payer: Self-pay | Source: Ambulatory Visit | Attending: Cardiology

## 2017-08-26 ENCOUNTER — Ambulatory Visit (HOSPITAL_COMMUNITY)
Admission: RE | Admit: 2017-08-26 | Discharge: 2017-08-26 | Disposition: A | Discharge status: Home / Self Care | Payer: PPO | Source: Ambulatory Visit | Attending: Cardiology | Admitting: Cardiology

## 2017-08-26 DIAGNOSIS — Z7984 Long term (current) use of oral hypoglycemic drugs: Secondary | ICD-10-CM | POA: Insufficient documentation

## 2017-08-26 DIAGNOSIS — Z87891 Personal history of nicotine dependence: Secondary | ICD-10-CM | POA: Insufficient documentation

## 2017-08-26 DIAGNOSIS — E1151 Type 2 diabetes mellitus with diabetic peripheral angiopathy without gangrene: Secondary | ICD-10-CM | POA: Insufficient documentation

## 2017-08-26 DIAGNOSIS — E1165 Type 2 diabetes mellitus with hyperglycemia: Secondary | ICD-10-CM | POA: Insufficient documentation

## 2017-08-26 DIAGNOSIS — K219 Gastro-esophageal reflux disease without esophagitis: Secondary | ICD-10-CM | POA: Insufficient documentation

## 2017-08-26 DIAGNOSIS — Z8249 Family history of ischemic heart disease and other diseases of the circulatory system: Secondary | ICD-10-CM | POA: Diagnosis not present

## 2017-08-26 DIAGNOSIS — I739 Peripheral vascular disease, unspecified: Secondary | ICD-10-CM | POA: Diagnosis present

## 2017-08-26 DIAGNOSIS — Z8673 Personal history of transient ischemic attack (TIA), and cerebral infarction without residual deficits: Secondary | ICD-10-CM | POA: Diagnosis not present

## 2017-08-26 DIAGNOSIS — E782 Mixed hyperlipidemia: Secondary | ICD-10-CM | POA: Insufficient documentation

## 2017-08-26 DIAGNOSIS — Z9889 Other specified postprocedural states: Secondary | ICD-10-CM | POA: Insufficient documentation

## 2017-08-26 DIAGNOSIS — I6523 Occlusion and stenosis of bilateral carotid arteries: Secondary | ICD-10-CM | POA: Diagnosis not present

## 2017-08-26 DIAGNOSIS — I10 Essential (primary) hypertension: Secondary | ICD-10-CM | POA: Diagnosis not present

## 2017-08-26 DIAGNOSIS — I2510 Atherosclerotic heart disease of native coronary artery without angina pectoris: Secondary | ICD-10-CM | POA: Insufficient documentation

## 2017-08-26 DIAGNOSIS — Z79899 Other long term (current) drug therapy: Secondary | ICD-10-CM | POA: Diagnosis not present

## 2017-08-26 DIAGNOSIS — I252 Old myocardial infarction: Secondary | ICD-10-CM | POA: Diagnosis not present

## 2017-08-26 DIAGNOSIS — Z951 Presence of aortocoronary bypass graft: Secondary | ICD-10-CM | POA: Diagnosis not present

## 2017-08-26 DIAGNOSIS — I70212 Atherosclerosis of native arteries of extremities with intermittent claudication, left leg: Secondary | ICD-10-CM | POA: Insufficient documentation

## 2017-08-26 HISTORY — PX: PERIPHERAL VASCULAR ATHERECTOMY: CATH118256

## 2017-08-26 HISTORY — PX: LOWER EXTREMITY ANGIOGRAPHY: CATH118251

## 2017-08-26 HISTORY — PX: PERIPHERAL VASCULAR BALLOON ANGIOPLASTY: CATH118281

## 2017-08-26 LAB — GLUCOSE, CAPILLARY: Glucose-Capillary: 123 mg/dL — ABNORMAL HIGH (ref 65–99)

## 2017-08-26 LAB — POCT ACTIVATED CLOTTING TIME
Activated Clotting Time: 268 seconds
Activated Clotting Time: 252 seconds

## 2017-08-26 SURGERY — LOWER EXTREMITY ANGIOGRAPHY
Anesthesia: LOCAL | Laterality: Left

## 2017-08-26 MED ORDER — SODIUM CHLORIDE 0.9% FLUSH: 3.0000 mL | INTRAVENOUS | Status: DC | PRN

## 2017-08-26 MED ORDER — ONDANSETRON HCL 4 MG/2ML IJ SOLN: 4.0000 mg | Freq: Four times a day (QID) | INTRAMUSCULAR | Status: DC | PRN

## 2017-08-26 MED ORDER — SODIUM CHLORIDE 0.9 % IV SOLN: 250.0000 mL | INTRAVENOUS | Status: DC | PRN

## 2017-08-26 MED ORDER — HYDROMORPHONE HCL 1 MG/ML IJ SOLN
INTRAMUSCULAR | Status: DC | PRN
  Administered 2017-08-26 (×2): 0.5 mg via INTRAVENOUS

## 2017-08-26 MED ORDER — ASPIRIN 81 MG PO TABS: 81.0000 mg | ORAL_TABLET | Freq: Every day | ORAL | Status: DC

## 2017-08-26 MED ORDER — IODIXANOL 320 MG/ML IV SOLN
INTRAVENOUS | Status: DC | PRN
  Administered 2017-08-26: 135 mL via INTRA_ARTERIAL

## 2017-08-26 MED ORDER — SODIUM CHLORIDE 0.9% FLUSH: 3.0000 mL | Freq: Two times a day (BID) | INTRAVENOUS | Status: DC

## 2017-08-26 MED ORDER — HYDRALAZINE HCL 20 MG/ML IJ SOLN: 5.0000 mg | INTRAMUSCULAR | Status: DC | PRN

## 2017-08-26 MED ORDER — ACETAMINOPHEN 325 MG PO TABS: 650.0000 mg | ORAL_TABLET | ORAL | Status: DC | PRN

## 2017-08-26 MED ORDER — HEPARIN (PORCINE) IN NACL 2-0.9 UNITS/ML
INTRAMUSCULAR | Status: AC | PRN
  Administered 2017-08-26 (×2): 500 mL

## 2017-08-26 MED ORDER — METFORMIN HCL 500 MG PO TABS: 500.0000 mg | ORAL_TABLET | Freq: Two times a day (BID) | ORAL | 0 refills | Status: DC

## 2017-08-26 MED ORDER — HEPARIN SODIUM (PORCINE) 1000 UNIT/ML IJ SOLN
INTRAMUSCULAR | Status: DC | PRN
  Administered 2017-08-26: 8000 [IU] via INTRAVENOUS
  Administered 2017-08-26: 3000 [IU] via INTRAVENOUS

## 2017-08-26 MED ORDER — SODIUM CHLORIDE 0.9 % WEIGHT BASED INFUSION: 1.0000 mL/kg/h | INTRAVENOUS | Status: DC

## 2017-08-26 MED ORDER — MIDAZOLAM HCL 2 MG/2ML IJ SOLN
INTRAMUSCULAR | Status: DC | PRN
  Administered 2017-08-26: 2 mg via INTRAVENOUS

## 2017-08-26 MED ORDER — LIDOCAINE HCL (PF) 1 % IJ SOLN
INTRAMUSCULAR | Status: DC | PRN
  Administered 2017-08-26: 15 mL

## 2017-08-26 MED ORDER — NITROGLYCERIN 1 MG/10 ML FOR IR/CATH LAB
INTRA_ARTERIAL | Status: DC | PRN
  Administered 2017-08-26: 300 ug via INTRA_ARTERIAL
  Administered 2017-08-26: 400 ug via INTRA_ARTERIAL
  Administered 2017-08-26 (×2): 300 ug via INTRA_ARTERIAL

## 2017-08-26 MED ORDER — SODIUM CHLORIDE 0.9 % IV SOLN: INTRAVENOUS | Status: DC

## 2017-08-26 MED ORDER — LABETALOL HCL 5 MG/ML IV SOLN: 10.0000 mg | INTRAVENOUS | Status: DC | PRN

## 2017-08-26 SURGICAL SUPPLY — 21 items
BALLN IN.PACT DCB 4X80 (BALLOONS) ×3
CATH HAWKONE LS STANDARD TIP (CATHETERS) ×3
CATH HAWKONE LS STD TIP (CATHETERS) ×2 IMPLANT
CATH OMNI FLUSH 5F 65CM (CATHETERS) ×3 IMPLANT
COVER PRB 48X5XTLSCP FOLD TPE (BAG) ×2 IMPLANT
COVER PROBE 5X48 (BAG) ×1
DCB IN.PACT 4X80 (BALLOONS) ×2 IMPLANT
DEVICE CLOSURE MYNXGRIP 6/7F (Vascular Products) ×3 IMPLANT
DEVICE SPIDERFX EMB PROT 4MM (WIRE) ×3 IMPLANT
GUIDEWIRE ANGLED .035X150CM (WIRE) ×3 IMPLANT
KIT ENCORE 26 ADVANTAGE (KITS) ×3 IMPLANT
KIT MICROPUNCTURE NIT STIFF (SHEATH) ×3 IMPLANT
KIT PV (KITS) ×3 IMPLANT
SHEATH AVANTI 11CM 5FR (SHEATH) ×3 IMPLANT
SHEATH HIGHFLEX ANSEL 7FR 55CM (SHEATH) ×3 IMPLANT
SHEATH PINNACLE 7F 10CM (SHEATH) ×3 IMPLANT
SYRINGE MEDRAD AVANTA MACH 7 (SYRINGE) ×3 IMPLANT
TRANSDUCER W/STOPCOCK (MISCELLANEOUS) ×3 IMPLANT
TRAY PV CATH (CUSTOM PROCEDURE TRAY) ×3 IMPLANT
WIRE HI TORQ COMMND ES.014X300 (WIRE) ×3 IMPLANT
WIRE HITORQ VERSACORE ST 145CM (WIRE) ×3 IMPLANT

## 2017-08-26 NOTE — Interval H&P Note (Signed)
History and Physical Interval Note:  08/26/2017 10:03 AM  Drew Harrison  has presented today for surgery, with the diagnosis of claudication  The various methods of treatment have been discussed with the patient and family. After consideration of risks, benefits and other options for treatment, the patient has consented to  Procedure(s): LOWER EXTREMITY ANGIOGRAPHY (N/A) and possible angioplasty as a surgical intervention .  The patient's history has been reviewed, patient examined, no change in status, stable for surgery.  I have reviewed the patient's chart and labs.  Questions were answered to the patient's satisfaction.     Adrian Prows

## 2017-08-26 NOTE — Discharge Instructions (Signed)

## 2017-08-26 NOTE — Progress Notes (Signed)
Up and walked and tolerated well; right groin stable, no bleeding or hematoma 

## 2017-08-28 IMAGING — CT CT CHEST W/ CM
2 of 3 series · 11 of 36 positions shown, 13 images · IV contrast (iopamidol)
Comparison: Chest x-ray dated 06/06/2016

CLINICAL DATA: Coronary artery disease. Calcification of the aorta.

EXAM:
CT CHEST WITH CONTRAST
TECHNIQUE: Multidetector CT imaging of the chest was performed during
intravenous contrast administration.
CONTRAST:  75mL 3X3D59-IAA IOPAMIDOL (3X3D59-IAA) INJECTION 61%

[Series 201: chest with, idose (2) · axial · 0.85mm/px · z∈[-6,+292]mm · 8 of 141 slices shown, 10 images]
[im 11/141  mediastinal]
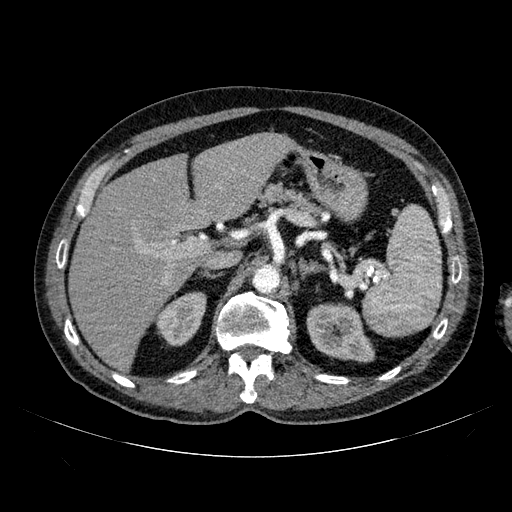
[im 11/141  lung]
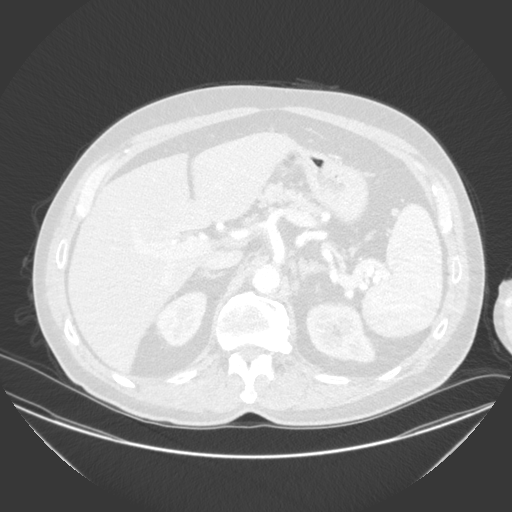
[im 26/141  lung]
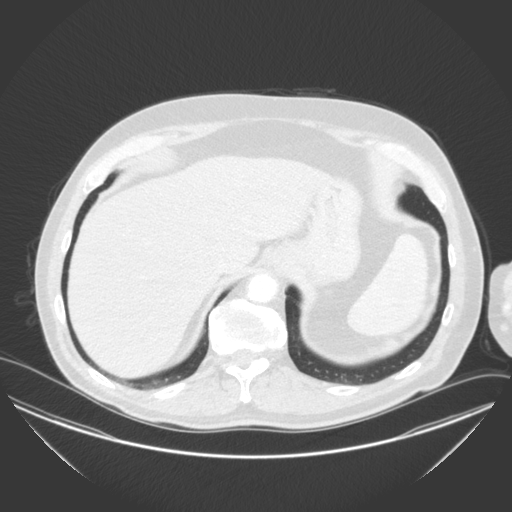
[im 47/141  lung]
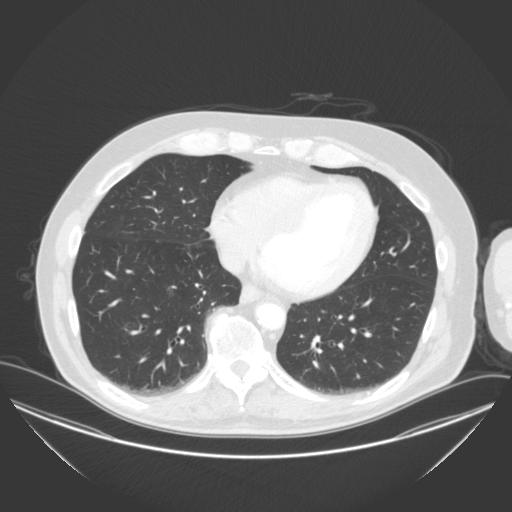
[im 63/141  lung]
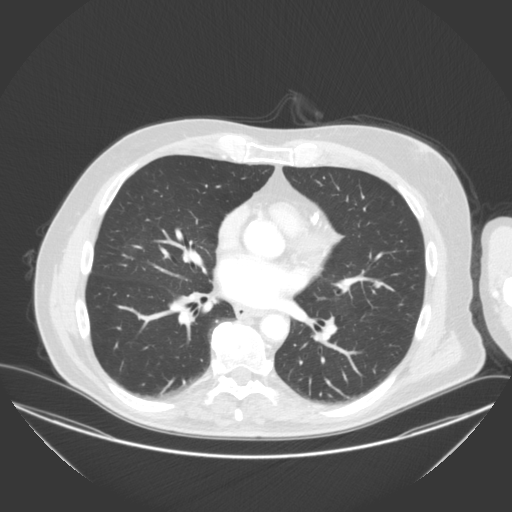
[im 78/141  mediastinal]
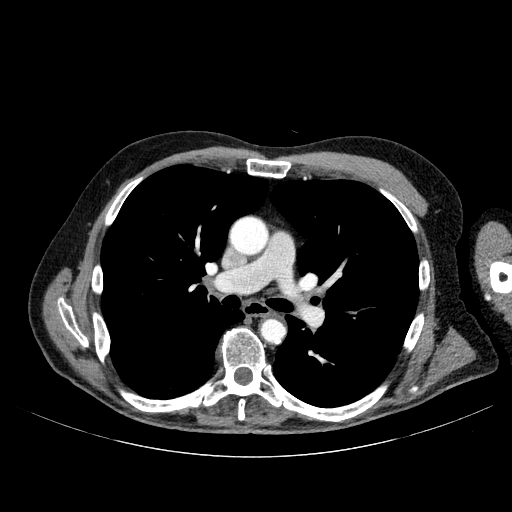
[im 78/141  lung]
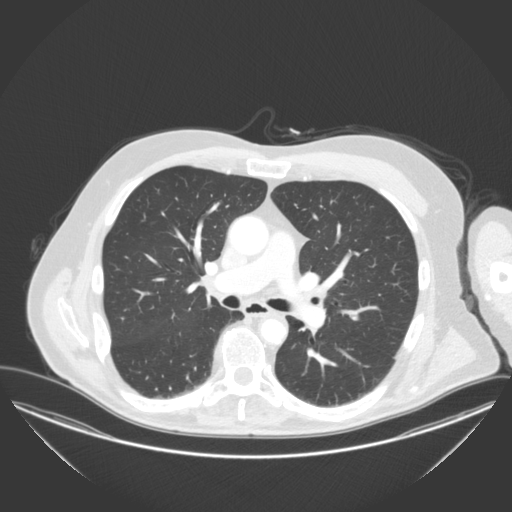
[im 94/141  lung]
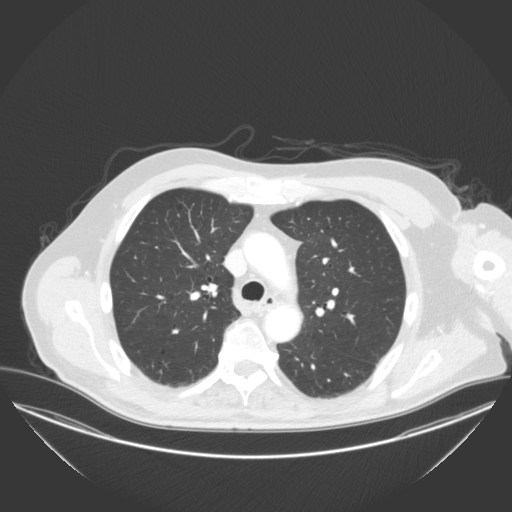
[im 115/141  lung]
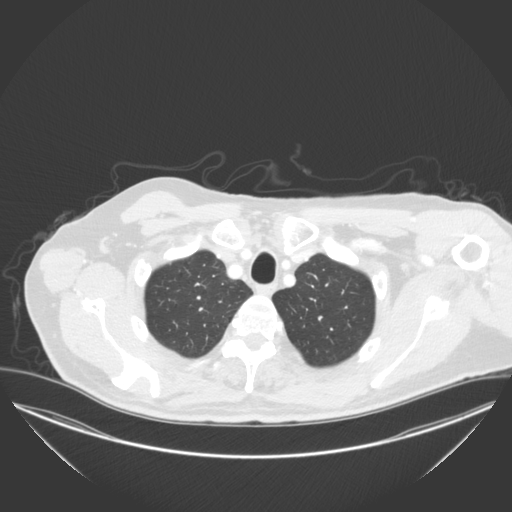
[im 130/141  lung]
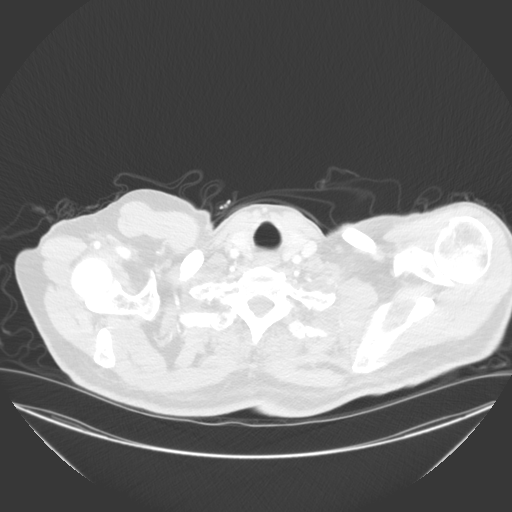

[Series 203: coronal, idose (2) · coronal · 0.45mm/px · 3 of 133 slices shown]
[im 27/133  lung]
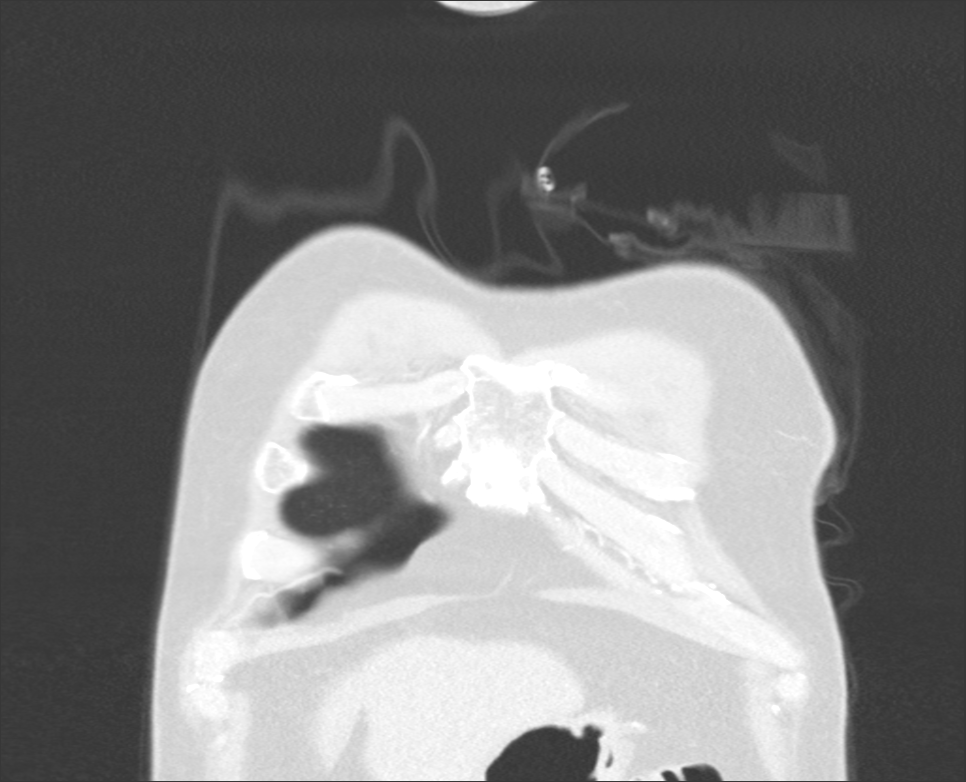
[im 53/133  lung]
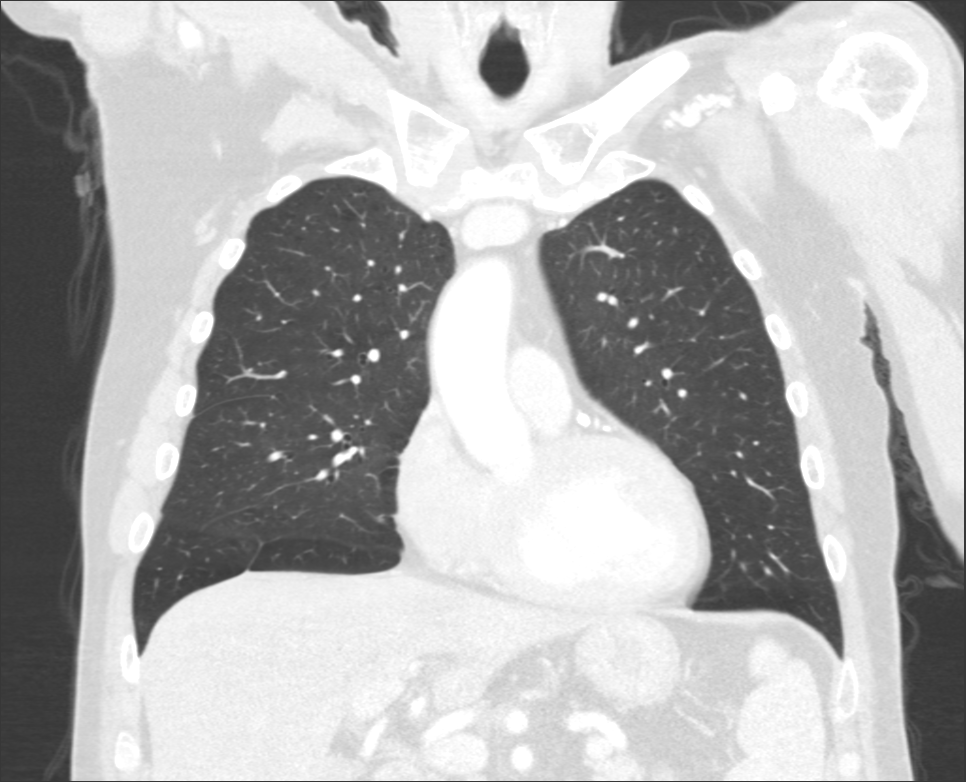
[im 80/133  lung]
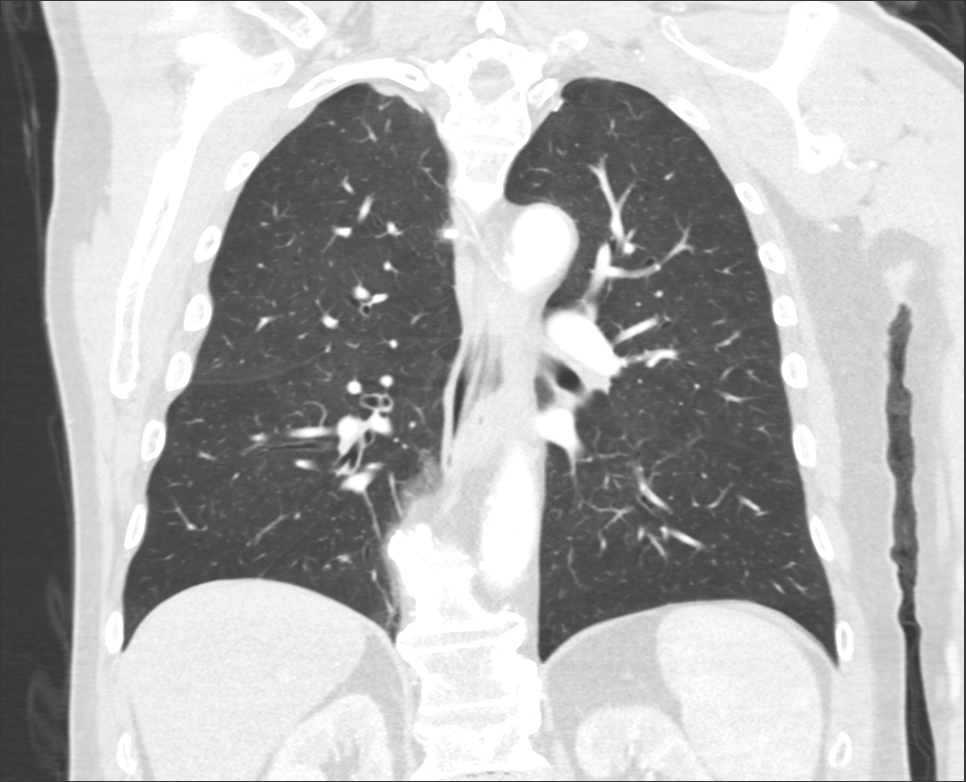

[11 of 36 positions shown; findings below may reference images not displayed]

FINDINGS: Cardiovascular: Aortic atherosclerosis. No aneurysmal dilatation or
aortic dissection. Extensive coronary artery calcifications. Overall
heart size is normal. No pericardial effusion.

Mediastinum/Nodes: No enlarged mediastinal, hilar, or axillary lymph
nodes. Thyroid gland, trachea, and esophagus demonstrate no
significant findings.

Lungs/Pleura: There are a few tiny calcified granulomas bilaterally.
The lungs are otherwise clear. No effusions.

Upper Abdomen: Normal.

Musculoskeletal: No chest wall abnormality. No acute or significant
osseous findings.
IMPRESSION: Extensive aortic atherosclerosis. Extensive coronary artery
calcification. No aneurysm formation or aortic dissection.

No acute abnormalities.

## 2017-08-28 MED FILL — Heparin Sod (Porcine)-NaCl IV Soln 1000 Unit/500ML-0.9%: INTRAVENOUS | Qty: 1000 | Status: AC

## 2017-09-03 DIAGNOSIS — I739 Peripheral vascular disease, unspecified: Secondary | ICD-10-CM | POA: Diagnosis not present

## 2017-09-03 DIAGNOSIS — I2510 Atherosclerotic heart disease of native coronary artery without angina pectoris: Secondary | ICD-10-CM | POA: Diagnosis not present

## 2017-09-08 ENCOUNTER — Encounter (HOSPITAL_COMMUNITY): Payer: Self-pay | Admitting: Cardiology

## 2017-09-15 DIAGNOSIS — I2510 Atherosclerotic heart disease of native coronary artery without angina pectoris: Secondary | ICD-10-CM | POA: Diagnosis not present

## 2017-09-18 DIAGNOSIS — I739 Peripheral vascular disease, unspecified: Secondary | ICD-10-CM | POA: Diagnosis not present

## 2017-09-21 NOTE — H&P (Signed)
OFFICE VISIT NOTES COPIED TO EPIC FOR DOCUMENTATION  History of Present Illness Drew Harrison; 09/04/2017 1:04 PM) Patient words: 7-10 day f/u PV Angiogram, vasc US results; Last office visit 08/04/17.  The patient is a 67 year old male who presents for a Follow-up for Coronary artery disease.  Additional reasons for visit:  Follow-up for Peripheral vascular disease is described as the following: 67 y/o Caucasian male with coronary artery disease s/p CABGX4 (05/2016), post op CVA with no significant neurodeficit, hypertension, type 2 DM, PAD with bilateral mod carotid stenosis, severe LE PAD.  Patient underwent directional atherectomy to distal L popliteal and distal L TP trunk, followed by [facility] PTA with 10% residual stenosis, as well as atherectomy and PTA to ostial to prox L PTA. (08/26/2017). He has had significant improvement in his claudication in left leg. He continues to have claudication in his right leg. Right groin site healing well. He is on aggressive medical therapy.  Other than claudication symptoms in right leg, no complaints today.  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 RCA ocluded. LAD 3 tandem prox to mid aneurysmal dilatation with 90% stenosis in the mid LAD. Smooth after stenosis. D1 prox 80%. Cx Occluded mid. RI-2 prox to mid 80%. Echocardiogram 03/30/2016: Normal LV systolic function EF 22-48%. Probable severe hypokinesis and scarring in the basal inferolateral and inferior myocardium. Consider CAD. Grade 2  diastolic dysfunction. Mild left atrial enlargement. Labwork  08/19/2017: Glucose 121, creatinine 1.2, EGFR 61, potassium 4.6, BMP otherwise normal. CBC normal. Labs 05/01/2017: A1c 6.5%. Potassium 4.9, serum glucose 123 mg, BUN 18, creatinine 1.2 to eGFR 61 mL, CMP normal. Total cholesterol 142, triglycerides 93, HDL 46, LDL 85. Non-HDL cholesterol 96. HB 13.9/HCT 41.8, normal indicis. 01/16/2017: Glucose 148, creatinine 1.16, EGFR 65/75, potassium 4.4, BMP normal. RDW 16%, CBC otherwise normal. INR 1.0, prothrombin time 10.5. Labs 06/22/2016: Total cholesterol 97, triglycerides 142, HDL 24, LDL 45. A1c 4.2%. Serum glucose 113 mg, potassium 3.8, BUN 25, creatinine 1.1, eGFR greater than 60 mL. HB 8.9/HCT 27.2, platelets 155. 05/08/2016: RBC 4.14, Hgb 12.5, Hct 35.5, Plts 124, CBC otherwise normal. TSH 4.8. Potassium 4.6, Creatinine 1.0, glucose 204, BMP normal. 05/07/70: Glucose 332, creatinine 0.93, sodium 129, potassium 4.7, BMP otherwise normal. Hemoglobin 12.7, hematocrit 36.7, no indices, platelets 130. INR 0.9, PT 9.5 03/30/2016: Cholesterol 222, triglycerides 187, HDL 47, LDL 138. A1c 6.9%. Carotid stenosis, right (I65.21)  Carotid artery duplex 04/15/2017: Stenosis in the right internal carotid artery (50-69%) with diffuse soft plaque. Mild stenosis in the left common carotid artery (<50%) with diffuse soft plaque. Antegrade right vertebral artery flow. Antegrade left vertebral artery flow. Follow up in six months is appropriate if clinically indicated. No significant change from 10/17/2016. Controlled type 2 diabetes mellitus with diabetic peripheral angiopathy without gangrene, with long-term current use of insulin (E11.51)  Gastroesophageal reflux disease without esophagitis (K21.9)  Claudication (I73.9)  Peripheral arteriogram 08/26/2017: Left TP trunk 99% and prox PT 80% with one vessel runoff. S/P directional atherectomy with and DCB 4 mm x 80 mm DCB, less than 10% residual stenosis,  reestablished flow into  the peroneal artery. Brisk two-vessel runoff at the level of the ankle. Left AT is occluded. 01/21/2017: Mild diffuse disease in bilateral SFA. Right below knee 3 vessel r/u with severe diffuse disease. Leg edema, right (R60.0)  Lower extremity venous insufficiency 02/25/2017: There is no evidence of DVT or venous insufficiency in the right lower extremity. Subcutaneous edema is noted. History of cerebrovascular accident with residual effects (I69.90) [03/30/2016]: MRI Brain, Brain Stem 03/29/2016 Acute lacunar type infarct in the left lower brainstem at the pontomedullary junction. 2nd stroke after CABG Right cerebellar stroke on 06/21/2016. History of tobacco use disorder (Q00.867) [03/30/2016]: Quit 03/30/2016: H/O four vessel coronary artery bypass graft (Z95.1) [06/17/2016]: CABG 06/17/2016: LIMA to LAD, SVG to D1, SVG to ramus intermediate, SVG to PDA by Drew Poot, MD  Allergies (Drew Harrison; 09/19/2017 3:48 PM) No Known Drug Allergies [04/04/2016]:  Family History (Drew Harrison; 19-Sep-2017 3:48 PM) Father  Deceased. at age 62, heart disease Mother  Living, no heart conditions Sister 1  Deceased. younger, aneurysm Brother 1  younger, no known heart conditions  Social History (Drew Harrison; 09/19/17 3:48 PM) Marital status  Single. Number of Children  1. Current tobacco use  Former smoker. quit 03/29/2016 Alcohol Use  Occasional alcohol use. Living Situation  Lives alone.  Past Surgical History (Drew Harrison; Sep 19, 2017 3:48 PM) Coronary Artery Bypass, Four [06/17/2016]: CABG 06/17/2016: LIMA to LAD, SVG to D1, SVG to ramus intermediate, SVG to PDA by Drew Poot, MD  Medication History (Drew Harrison; 09/19/17 3:57 PM) Naproxen Sodium (220MG Tablet, 1 Oral as needed) Active. Metoprolol Tartrate (25MG Tablet, 1 (one) T Tablet Tablet Tab Oral two times daily, Taken starting 01/28/2017) Active. MetFORMIN HCl (500MG  Tablet, 1 Oral two times daily) Active. Losartan Potassium (100MG Tablet, 1 Oral daily) Active. Levemir FlexTouch (100UNIT/ML Soln Pen-inj, 40U Subcutaneous daily) Active. Famotidine (40MG Tablet, 1 Oral two times daily) Active. Clopidogrel Bisulfate (75MG Tablet, 1 (one) Tablet Tablet Tablet T Oral daily, Taken starting 04/26/2016) Active. Atorvastatin Calcium (40MG Tablet, 1 Tablet Tablet Tablet Tablet Oral daily, Taken starting 04/26/2016) Active. (Continuation of therapy) Aspirin (81MG Tablet DR, 1 Oral daily) Active. AmLODIPine Besylate (10MG Tablet, 1 (one) Tablet Tablet Tablet T Oral daily, Taken starting 11/11/2016) Active. Allopurinol (100MG Tablet, 2 Oral daily) Active. PreviDent 5000 Booster Plus (1.1% Paste, Dental daily) Active. Medications Reconciled (verbally with pt; list present)  Diagnostic Studies History (Drew Harrison; 09-19-2017 3:52 PM) ABI 09/18/2017: This exam reveals moderately decreased perfusion of the right lower extremity, noted at the dorsalis pedis artery level (ABI 0.67) and moderately decreased perfusion of the left lower extremity, noted at the post tibial artery level (ABI 0.77). Mildly abnormal waveforms of the right ankle. Moderately abnormal waveforms of the left ankle.  Compared to 12/18/2016,  RABI 0.77 and LABI 0.60.  Peripheral arteriogram [08/26/2017]: Peripheral arteriogram 08/26/2017: Left TP trunk 99% and prox PT 80% with one vessel runoff. S/P directional atherectomy with and DCB 4 mm x 80 mm DCB, less than 10% residual stenosis, reestablished flow into the peroneal artery. Brisk two-vessel runoff at the level of the ankle. Left AT is occluded. Nuclear stress test [04/08/2016]: 1. Resting EKG 1 normal sinus rhythm, normal axis. Inferior infarct old, cannot exclude anterior infarct old. LVH, nonspecific T abnormality, cannot exclude inferior and lateral ischemia. Stress EKG is non-diagnostic for ischemia as it a pharmacologic stress using  Lexiscan. 2. There is transient ischemic dilatationof the LV with left ventricular end-diastolic volume of 619 mL in stress images. There is  a very large size severe defect in the inferior, inferolateral wall extending from the base towards the apex with mild to moderate amount of pre-infarct ischemia. Left ventricular systolic function calculated by QGS was markedly depressed at 34% with generalized hypokinesis and inferior and lateral akinesis.This is a high risk study, consider further cardiac work-up. Coronary Angiogram [05/13/2016]: Normal LVEF. RCA ocluded. LAD 3 tandem prox to mid aneurysmal dilatation with 90% stenosis in the mid LAD. Smooth after stenosis. D1 prox 80%. Cx Occluded mid. RI-2 prox to mid 80%. Echocardiogram [03/29/2016]: Left ventricle:The cavity size was normal.Wall thickness was normal.Systolic function was normal.The estimated ejection fraction was in the range of 50% to 55%.Probable severe hypokinesis and scarring of the basalinferolateral and inferior myocardium;in the distribution of the right coronary or left circumflex coronary artery.Features are consistent with a pseudonormal left ventricular filling pattern,with concomitant abnormal relaxation and increasedfilling pressure(grade 2 diastolic dysfuntion).Mitral valve:There was mild to moderate regurgitation.Left Atrium:The Atrium was mildly dilated.    Review of Systems Drew Harrison; 09/04/2017 12:56 PM) General Not Present- Anorexia, Fatigue and Fever. HEENT Not Present- Blurred Vision. Respiratory Present- Difficulty Breathing on Exertion. Not Present- Cough, Wakes up from Sleep Wheezing or Short of Breath and Wheezing. Cardiovascular Present- Claudications (right calf) and Edema. Not Present- Orthopnea and Paroxysmal Nocturnal Dyspnea. Gastrointestinal Not Present- Black, Tarry Stool, Change in Bowel Habits and Nausea. Musculoskeletal Present- Joint Pain (Right knee pain). Neurological Not Present- Focal  Neurological Symptoms and Syncope. Psychiatric Not Present- Hallucinations. Endocrine Not Present- Cold Intolerance, Excessive Sweating, Heat Intolerance and Thyroid Problems. Hematology Not Present- Anemia, Easy Bruising, Petechiae and Prolonged Bleeding. All other systems negative  Vitals (Drew Harrison; 09/03/2017 3:59 PM) 09/03/2017 3:52 PM Weight: 221.19 lb Height: 72in Body Surface Area: 2.22 m Body Mass Index: 30 kg/m  Pulse: 85 (Regular)  P.OX: 98% (Room air) BP: 126/62 (Sitting, Left Arm, Standard)       Physical Exam Drew Harrison; 09/03/2017 4:20 PM) General Mental Status-Alert. General Appearance-Cooperative and Appears stated age. Build & Nutrition-Well built and Well nourished.  Head and Neck Thyroid Gland Characteristics - normal size and consistency and no palpable nodules.  Chest and Lung Exam Chest and lung exam reveals -quiet, even and easy respiratory effort with no use of accessory muscles, non-tender and on auscultation, normal breath sounds, no adventitious sounds.  Cardiovascular Cardiovascular examination reveals -normal heart sounds, regular rate and rhythm with no murmurs.  Abdomen Palpation/Percussion Normal exam - Non Tender and No hepatosplenomegaly.  Peripheral Vascular Lower Extremity Inspection - Bilateral - Loss of hair and Pigmented, No Varicose veins. Palpation - Temperature - Bilateral - Cool(toes). Edema - Bilateral - 1+ Pitting edema(ankle). Femoral pulse - Bilateral - Normal. Popliteal pulse - Bilateral - 1+. Dorsalis pedis pulse - Left - 1+. Right - Absent. Posterior tibial pulse - Bilateral - Absent. Carotid arteries - Left-No Carotid bruit. Carotid arteries - Right-Soft Bruit. Abdomen-No prominent abdominal aortic pulsation, No epigastric bruit.  Neurologic Neurologic evaluation reveals -alert and oriented x 3 with no impairment of recent or remote memory. Motor-Grossly intact without  any focal deficits.  Musculoskeletal Global Assessment Left Lower Extremity - no deformities, masses or tenderness, no known fractures. Right Lower Extremity - no deformities, masses or tenderness, no known fractures.    Assessment & Plan Drew Harrison; 09/04/2017 1:07 PM) PAD (peripheral artery disease) (I73.9) ABI 09/18/2017: This exam reveals moderately decreased perfusion of the right lower extremity, noted at the dorsalis pedis artery level (ABI 0.67)  and moderately decreased perfusion of the left lower extremity, noted at the post tibial artery level (ABI 0.77). Mildly abnormal waveforms of the right ankle. Moderately abnormal waveforms of the left ankle.  Compared to 12/18/2016,  RABI 0.77 and LABI 0.60.  Claudication (I73.9) Story: Peripheral arteriogram 08/26/2017: Left TP trunk 99% and prox PT 80% with one vessel runoff. S/P directional atherectomy with and DCB 4 mm x 80 mm DCB, less than 10% residual stenosis, reestablished flow into the peroneal artery. Brisk two-vessel runoff at the level of the ankle. Left AT is occluded.  Lower extremities arteriogram 01/21/2017: Mild disease in the iliac vessels, mild disease in bilateral SFA, below right knee three-vessel runoff with mild to moderate diffuse disease.  Left TP trunk has 80-90% stenosis.  Atherosclerosis of native coronary artery of native heart without angina pectoris (I25.10) Story: CABG 06/17/2016: LIMA to LAD, SVG to D1, SVG to ramus intermediate, SVG to PDA by Drew Poot, MD  Coronary angiogram 05/13/2016: Normal LVEF. RCA ocluded. LAD 3 tandem prox to mid aneurysmal dilatation with 90% stenosis in the mid LAD. Smooth after stenosis. D1 prox 80%. Cx Occluded mid. RI-2 prox to mid 80%.  Echocardiogram 03/30/2016: Normal LV systolic function EF 71-69%. Probable severe hypokinesis and scarring in the basal inferolateral and inferior myocardium. Consider CAD. Grade 2 diastolic dysfunction. Mild left atrial  enlargement. Impression: EKG 08/04/2017: Normal sinus rhythm at rate of 71 bpm, inferior infarct old, anteroseptal infarct old. Nonspecific T abnormality. Compared to EKG 07/11/2016: LVH with repolarization abnormality not seen.   Labwork 09/16/2017: Creatinine 1.18, EGFR 63/73, potassium 4.7, BMP normal.  CBC normal.  08/19/2017: Glucose 121, creatinine 1.2, EGFR 61, potassium 4.6, BMP otherwise normal. CBC normal.  Labs 05/01/2017: A1c 6.5%. Potassium 4.9, serum glucose 123 mg, BUN 18, creatinine 1.2 to eGFR 61 mL, CMP normal. Total cholesterol 142, triglycerides 93, HDL 46, LDL 85. Non-HDL cholesterol 96. HB 13.9/HCT 41.8, normal indicis.  01/16/2017: Glucose 148, creatinine 1.16, EGFR 65/75, potassium 4.4, BMP normal. RDW 16%, CBC otherwise normal. INR 1.0, prothrombin time 10.5.  Labs 06/22/2016: Total cholesterol 97, triglycerides 142, HDL 24, LDL 45. A1c 4.2%. Serum glucose 113 mg, potassium 3.8, BUN 25, creatinine 1.1, eGFR greater than 60 mL. HB 8.9/HCT 27.2, platelets 155.  05/08/2016: RBC 4.14, Hgb 12.5, Hct 35.5, Plts 124, CBC otherwise normal. TSH 4.8. Potassium 4.6, Creatinine 1.0, glucose 204, BMP normal.  05/07/70: Glucose 332, creatinine 0.93, sodium 129, potassium 4.7, BMP otherwise normal. Hemoglobin 12.7, hematocrit 36.7, no indices, platelets 130. INR 0.9, PT 9.5  03/30/2016: Cholesterol 222, triglycerides 187, HDL 47, LDL 138. A1c 6.9%.  Note:Recommendation:  Drew Harrison is a Caucasian male with CAD status post four-vessel CABG in 05/2016, post-op stroke with no significant residual neurodeficit, hypertension, type II diabetes mellitus, bilateral moderate carotid artery stenoses and severe small vessel PAD.  Claudication symptoms have significantly improved on the left leg and right groin site is healing well. No evidence of infection or hematoma. Continues to have claudication symptoms on his right leg despite aggressive medical management. We'll recommend evaluation  with peripheral angiogram to be scheduled in the next one to 2 weeks. He will also need ABI performed in the next few weeks for evaluation (enclosed) and shows decreased ABI from previous.  No changes were made to medications today. For maintenance CAD standpoint he has remained stable. We'll see him back after his procedure for reevaluation and further recommendations.  *I have discussed this case with Dr. Virgina Jock and he personally  examined the patient and participated in formulating the plan.*  Cc Dewayne Shorter, PA-C  Signed by Drew Maine, Harrison (09/04/2017 1:07 PM)

## 2017-09-23 ENCOUNTER — Ambulatory Visit (HOSPITAL_COMMUNITY)
Admission: RE | Admit: 2017-09-23 | Discharge: 2017-09-23 | Disposition: A | Discharge status: Home / Self Care | Payer: PPO | Source: Ambulatory Visit | Attending: Cardiology | Admitting: Cardiology

## 2017-09-23 ENCOUNTER — Encounter (HOSPITAL_COMMUNITY)
Admission: RE | Disposition: A | Discharge status: Home / Self Care | Payer: Self-pay | Source: Ambulatory Visit | Attending: Cardiology

## 2017-09-23 DIAGNOSIS — I70211 Atherosclerosis of native arteries of extremities with intermittent claudication, right leg: Secondary | ICD-10-CM | POA: Insufficient documentation

## 2017-09-23 DIAGNOSIS — I2510 Atherosclerotic heart disease of native coronary artery without angina pectoris: Secondary | ICD-10-CM | POA: Diagnosis not present

## 2017-09-23 DIAGNOSIS — I10 Essential (primary) hypertension: Secondary | ICD-10-CM | POA: Insufficient documentation

## 2017-09-23 DIAGNOSIS — Z7982 Long term (current) use of aspirin: Secondary | ICD-10-CM | POA: Insufficient documentation

## 2017-09-23 DIAGNOSIS — Z7984 Long term (current) use of oral hypoglycemic drugs: Secondary | ICD-10-CM | POA: Diagnosis not present

## 2017-09-23 DIAGNOSIS — E1151 Type 2 diabetes mellitus with diabetic peripheral angiopathy without gangrene: Secondary | ICD-10-CM | POA: Diagnosis not present

## 2017-09-23 DIAGNOSIS — K219 Gastro-esophageal reflux disease without esophagitis: Secondary | ICD-10-CM | POA: Diagnosis not present

## 2017-09-23 DIAGNOSIS — E782 Mixed hyperlipidemia: Secondary | ICD-10-CM | POA: Diagnosis not present

## 2017-09-23 DIAGNOSIS — Z951 Presence of aortocoronary bypass graft: Secondary | ICD-10-CM | POA: Insufficient documentation

## 2017-09-23 DIAGNOSIS — I6990 Unspecified sequelae of unspecified cerebrovascular disease: Secondary | ICD-10-CM | POA: Diagnosis not present

## 2017-09-23 DIAGNOSIS — I6523 Occlusion and stenosis of bilateral carotid arteries: Secondary | ICD-10-CM | POA: Insufficient documentation

## 2017-09-23 DIAGNOSIS — Z87891 Personal history of nicotine dependence: Secondary | ICD-10-CM | POA: Diagnosis not present

## 2017-09-23 DIAGNOSIS — I739 Peripheral vascular disease, unspecified: Secondary | ICD-10-CM | POA: Diagnosis present

## 2017-09-23 DIAGNOSIS — R6889 Other general symptoms and signs: Secondary | ICD-10-CM | POA: Diagnosis not present

## 2017-09-23 HISTORY — PX: PERIPHERAL VASCULAR BALLOON ANGIOPLASTY: CATH118281

## 2017-09-23 HISTORY — PX: LOWER EXTREMITY ANGIOGRAPHY: CATH118251

## 2017-09-23 HISTORY — PX: PERIPHERAL VASCULAR ATHERECTOMY: CATH118256

## 2017-09-23 LAB — GLUCOSE, CAPILLARY
Glucose-Capillary: 133 mg/dL — ABNORMAL HIGH (ref 70–99)
Glucose-Capillary: 153 mg/dL — ABNORMAL HIGH (ref 70–99)

## 2017-09-23 LAB — POCT ACTIVATED CLOTTING TIME
Activated Clotting Time: 307 seconds
Activated Clotting Time: 241 seconds
Activated Clotting Time: 224 seconds
Activated Clotting Time: 191 seconds
Activated Clotting Time: 175 seconds

## 2017-09-23 SURGERY — LOWER EXTREMITY ANGIOGRAPHY
Anesthesia: LOCAL | Laterality: Right

## 2017-09-23 MED ORDER — LIDOCAINE HCL (PF) 1 % IJ SOLN
INTRAMUSCULAR | Status: DC | PRN
  Administered 2017-09-23: 15 mL

## 2017-09-23 MED ORDER — MIDAZOLAM HCL 2 MG/2ML IJ SOLN
INTRAMUSCULAR | Status: AC
  Filled 2017-09-23: qty 2

## 2017-09-23 MED ORDER — SODIUM CHLORIDE 0.9 % IV SOLN: 250.0000 mL | INTRAVENOUS | Status: DC | PRN

## 2017-09-23 MED ORDER — MIDAZOLAM HCL 2 MG/2ML IJ SOLN
INTRAMUSCULAR | Status: DC | PRN
  Administered 2017-09-23 (×3): 1 mg via INTRAVENOUS

## 2017-09-23 MED ORDER — HEPARIN (PORCINE) IN NACL 2-0.9 UNITS/ML
INTRAMUSCULAR | Status: AC | PRN
  Administered 2017-09-23: 500 mL

## 2017-09-23 MED ORDER — NITROGLYCERIN 1 MG/10 ML FOR IR/CATH LAB
INTRA_ARTERIAL | Status: DC | PRN
  Administered 2017-09-23 (×5): 200 ug via INTRA_ARTERIAL

## 2017-09-23 MED ORDER — HEPARIN SODIUM (PORCINE) 1000 UNIT/ML IJ SOLN
INTRAMUSCULAR | Status: AC
  Filled 2017-09-23: qty 1

## 2017-09-23 MED ORDER — FENTANYL CITRATE (PF) 100 MCG/2ML IJ SOLN
INTRAMUSCULAR | Status: DC | PRN
  Administered 2017-09-23 (×3): 50 ug via INTRAVENOUS

## 2017-09-23 MED ORDER — NITROGLYCERIN IN D5W 200-5 MCG/ML-% IV SOLN
INTRAVENOUS | Status: AC
  Filled 2017-09-23: qty 250

## 2017-09-23 MED ORDER — HEPARIN (PORCINE) IN NACL 1000-0.9 UT/500ML-% IV SOLN
INTRAVENOUS | Status: AC
  Filled 2017-09-23: qty 1000

## 2017-09-23 MED ORDER — FENTANYL CITRATE (PF) 100 MCG/2ML IJ SOLN
INTRAMUSCULAR | Status: AC
  Filled 2017-09-23: qty 2

## 2017-09-23 MED ORDER — LABETALOL HCL 5 MG/ML IV SOLN: 10.0000 mg | INTRAVENOUS | Status: DC | PRN

## 2017-09-23 MED ORDER — ONDANSETRON HCL 4 MG/2ML IJ SOLN: 4.0000 mg | Freq: Four times a day (QID) | INTRAMUSCULAR | Status: DC | PRN

## 2017-09-23 MED ORDER — VERAPAMIL HCL 2.5 MG/ML IV SOLN
INTRAVENOUS | Status: AC
  Filled 2017-09-23: qty 2

## 2017-09-23 MED ORDER — SODIUM CHLORIDE 0.9% FLUSH: 3.0000 mL | Freq: Two times a day (BID) | INTRAVENOUS | Status: DC

## 2017-09-23 MED ORDER — SODIUM CHLORIDE 0.9 % IV SOLN: INTRAVENOUS | Status: DC

## 2017-09-23 MED ORDER — SODIUM CHLORIDE 0.9 % IV SOLN: INTRAVENOUS | Status: AC

## 2017-09-23 MED ORDER — VIPERSLIDE LUBRICANT OPTIME
TOPICAL | Status: DC | PRN
  Administered 2017-09-23: 09:00:00 via SURGICAL_CAVITY

## 2017-09-23 MED ORDER — LIDOCAINE HCL (PF) 1 % IJ SOLN
INTRAMUSCULAR | Status: AC
  Filled 2017-09-23: qty 30

## 2017-09-23 MED ORDER — HYDRALAZINE HCL 20 MG/ML IJ SOLN: 5.0000 mg | INTRAMUSCULAR | Status: DC | PRN

## 2017-09-23 MED ORDER — HEPARIN SODIUM (PORCINE) 1000 UNIT/ML IJ SOLN
INTRAMUSCULAR | Status: DC | PRN
  Administered 2017-09-23: 10000 [IU] via INTRAVENOUS
  Administered 2017-09-23: 2000 [IU] via INTRAVENOUS
  Administered 2017-09-23: 3000 [IU] via INTRAVENOUS

## 2017-09-23 MED ORDER — SODIUM CHLORIDE 0.9 % IV BOLUS
500.0000 mL | Freq: Once | INTRAVENOUS | Status: AC
  Administered 2017-09-23: 500 mL via INTRAVENOUS

## 2017-09-23 MED ORDER — SODIUM CHLORIDE 0.9% FLUSH: 3.0000 mL | INTRAVENOUS | Status: DC | PRN

## 2017-09-23 MED ORDER — ACETAMINOPHEN 325 MG PO TABS: 650.0000 mg | ORAL_TABLET | ORAL | Status: DC | PRN

## 2017-09-23 MED ORDER — IODIXANOL 320 MG/ML IV SOLN
INTRAVENOUS | Status: DC | PRN
  Administered 2017-09-23: 80 mL via INTRA_ARTERIAL

## 2017-09-23 SURGICAL SUPPLY — 27 items
BALLN COYOTE OTW 3X60X150 (BALLOONS) ×3
BALLOON COYOTE OTW 3X60X150 (BALLOONS) ×2 IMPLANT
CATH OMNI FLUSH 5F 65CM (CATHETERS) ×3 IMPLANT
CATH TEMPO AQUA 5F 100CM (CATHETERS) ×3 IMPLANT
COVER PRB 48X5XTLSCP FOLD TPE (BAG) ×2 IMPLANT
COVER PROBE 5X48 (BAG) ×1
CROWN STEALTH MICRO-30 1.25MM (CATHETERS) ×3 IMPLANT
DEVICE CONTINUOUS FLUSH (MISCELLANEOUS) ×3 IMPLANT
GUIDEWIRE ANGLED .035X150CM (WIRE) ×3 IMPLANT
KIT ENCORE 26 ADVANTAGE (KITS) ×3 IMPLANT
KIT MICROPUNCTURE NIT STIFF (SHEATH) ×3 IMPLANT
KIT PV (KITS) ×3 IMPLANT
LUBRICANT VIPERSLIDE CORONARY (MISCELLANEOUS) ×3 IMPLANT
SHEATH PINNACLE 5F 10CM (SHEATH) ×3 IMPLANT
SHEATH SHUTTLE 5F/110 (SHEATH) ×3 IMPLANT
SHIELD RADPAD SCOOP 12X17 (MISCELLANEOUS) ×3 IMPLANT
STOPCOCK MORSE 400PSI 3WAY (MISCELLANEOUS) ×3 IMPLANT
SYRINGE MEDRAD AVANTA MACH 7 (SYRINGE) IMPLANT
TAPE VIPERTRACK RADIOPAQ (MISCELLANEOUS) ×2 IMPLANT
TAPE VIPERTRACK RADIOPAQUE (MISCELLANEOUS) ×1
TRANSDUCER W/STOPCOCK (MISCELLANEOUS) ×3 IMPLANT
TRAY PV CATH (CUSTOM PROCEDURE TRAY) ×3 IMPLANT
TUBING CIL FLEX 10 FLL-RA (TUBING) ×3 IMPLANT
WIRE HI TORQ COMMND ES.014X300 (WIRE) ×3 IMPLANT
WIRE HITORQ VERSACORE ST 145CM (WIRE) ×3 IMPLANT
WIRE VERSACORE LOC 115CM (WIRE) ×3 IMPLANT
WIRE VIPER WIRECTO 0.014 (WIRE) ×3 IMPLANT

## 2017-09-23 NOTE — Interval H&P Note (Signed)
History and Physical Interval Note:  09/23/2017 7:26 AM  Drew Harrison  has presented today for surgery, with the diagnosis of pad  The various methods of treatment have been discussed with the patient and family. After consideration of risks, benefits and other options for treatment, the patient has consented to  Procedure(s): LOWER EXTREMITY ANGIOGRAPHY (Right) as a surgical intervention .  The patient's history has been reviewed, patient examined, no change in status, stable for surgery.  I have reviewed the patient's chart and labs.  Questions were answered to the patient's satisfaction.     [place]

## 2017-09-23 NOTE — Discharge Instructions (Signed)
NO METFORMIN/GLUCOPHAGE FOR 2 DAYS ° ° °Femoral Site Care °Refer to this sheet in the next few weeks. These instructions provide you with information about caring for yourself after your procedure. Your health care provider may also give you more specific instructions. Your treatment has been planned according to current medical practices, but problems sometimes occur. Call your health care provider if you have any problems or questions after your procedure. °What can I expect after the procedure? °After your procedure, it is typical to have the following: °· Bruising at the site that usually fades within 1-2 weeks. °· Blood collecting in the tissue (hematoma) that may be painful to the touch. It should usually decrease in size and tenderness within 1-2 weeks. ° °Follow these instructions at home: °· Take medicines only as directed by your health care provider. °· You may shower 24-48 hours after the procedure or as directed by your health care provider. Remove the bandage (dressing) and gently wash the site with plain soap and water. Pat the area dry with a clean towel. Do not rub the site, because this may cause bleeding. °· Do not take baths, swim, or use a hot tub until your health care provider approves. °· Check your insertion site every day for redness, swelling, or drainage. °· Do not apply powder or lotion to the site. °· Limit use of stairs to twice a day for the first 2-3 days or as directed by your health care provider. °· Do not squat for the first 2-3 days or as directed by your health care provider. °· Do not lift over 10 lb (4.5 kg) for 5 days after your procedure or as directed by your health care provider. °· Ask your health care provider when it is okay to: °? Return to work or school. °? Resume usual physical activities or sports. °? Resume sexual activity. °· Do not drive home if you are discharged the same day as the procedure. Have someone else drive you. °· You may drive 24 hours after the  procedure unless otherwise instructed by your health care provider. °· Do not operate machinery or power tools for 24 hours after the procedure or as directed by your health care provider. °· If your procedure was done as an outpatient procedure, which means that you went home the same day as your procedure, a responsible adult should be with you for the first 24 hours after you arrive home. °· Keep all follow-up visits as directed by your health care provider. This is important. °Contact a health care provider if: °· You have a fever. °· You have chills. °· You have increased bleeding from the site. Hold pressure on the site. °Get help right away if: °· You have unusual pain at the site. °· You have redness, warmth, or swelling at the site. °· You have drainage (other than a small amount of blood on the dressing) from the site. °· The site is bleeding, and the bleeding does not stop after 30 minutes of holding steady pressure on the site. °· Your leg or foot becomes pale, cool, tingly, or numb. °This information is not intended to replace advice given to you by your health care provider. Make sure you discuss any questions you have with your health care provider. °Document Released: 11/12/2013 Document Revised: 08/17/2015 Document Reviewed: 09/28/2013 °Elsevier Interactive Patient Education © 2018 [employer] ° °

## 2017-09-23 NOTE — Progress Notes (Signed)
32fr sheath aspirated and removed from LFA, manual pressure applied for 20 minutes. Groin level 0, tegaderm dressing applied. Bedrest instructions given.  Left dp pulse palpable. Left pt and right dp and pt present with doppler. Left dp was very faint with doppler.   Bedrest begins at 12:20:00

## 2017-09-24 ENCOUNTER — Encounter (HOSPITAL_COMMUNITY): Payer: Self-pay | Admitting: Cardiology

## 2017-09-29 ENCOUNTER — Encounter (HOSPITAL_COMMUNITY): Payer: Self-pay | Admitting: Cardiology

## 2017-10-07 ENCOUNTER — Ambulatory Visit: Payer: PPO | Admitting: Nutrition

## 2017-10-07 DIAGNOSIS — I2510 Atherosclerotic heart disease of native coronary artery without angina pectoris: Secondary | ICD-10-CM | POA: Diagnosis not present

## 2017-10-07 DIAGNOSIS — Z0189 Encounter for other specified special examinations: Secondary | ICD-10-CM | POA: Diagnosis not present

## 2017-10-07 DIAGNOSIS — I739 Peripheral vascular disease, unspecified: Secondary | ICD-10-CM | POA: Diagnosis not present

## 2017-10-15 ENCOUNTER — Ambulatory Visit: Payer: PPO | Admitting: Nutrition

## 2017-10-15 DIAGNOSIS — I6521 Occlusion and stenosis of right carotid artery: Secondary | ICD-10-CM | POA: Diagnosis not present

## 2017-11-03 ENCOUNTER — Ambulatory Visit: Payer: PPO | Admitting: Nutrition

## 2017-11-06 DIAGNOSIS — I10 Essential (primary) hypertension: Secondary | ICD-10-CM | POA: Diagnosis not present

## 2017-11-06 DIAGNOSIS — E782 Mixed hyperlipidemia: Secondary | ICD-10-CM | POA: Diagnosis not present

## 2017-11-06 DIAGNOSIS — E119 Type 2 diabetes mellitus without complications: Secondary | ICD-10-CM | POA: Diagnosis not present

## 2017-11-11 DIAGNOSIS — H60391 Other infective otitis externa, right ear: Secondary | ICD-10-CM | POA: Diagnosis not present

## 2017-11-11 DIAGNOSIS — Z125 Encounter for screening for malignant neoplasm of prostate: Secondary | ICD-10-CM | POA: Diagnosis not present

## 2017-11-11 DIAGNOSIS — R351 Nocturia: Secondary | ICD-10-CM | POA: Diagnosis not present

## 2017-11-11 DIAGNOSIS — N401 Enlarged prostate with lower urinary tract symptoms: Secondary | ICD-10-CM | POA: Diagnosis not present

## 2017-11-11 DIAGNOSIS — I639 Cerebral infarction, unspecified: Secondary | ICD-10-CM | POA: Diagnosis not present

## 2017-11-11 DIAGNOSIS — E782 Mixed hyperlipidemia: Secondary | ICD-10-CM | POA: Diagnosis not present

## 2017-11-11 DIAGNOSIS — Z23 Encounter for immunization: Secondary | ICD-10-CM | POA: Diagnosis not present

## 2017-11-11 DIAGNOSIS — M1A9XX0 Chronic gout, unspecified, without tophus (tophi): Secondary | ICD-10-CM | POA: Diagnosis not present

## 2017-11-11 DIAGNOSIS — Z1159 Encounter for screening for other viral diseases: Secondary | ICD-10-CM | POA: Diagnosis not present

## 2017-11-11 DIAGNOSIS — K219 Gastro-esophageal reflux disease without esophagitis: Secondary | ICD-10-CM | POA: Diagnosis not present

## 2017-11-11 DIAGNOSIS — D649 Anemia, unspecified: Secondary | ICD-10-CM | POA: Diagnosis not present

## 2017-11-11 DIAGNOSIS — L03011 Cellulitis of right finger: Secondary | ICD-10-CM | POA: Diagnosis not present

## 2017-11-11 DIAGNOSIS — I10 Essential (primary) hypertension: Secondary | ICD-10-CM | POA: Diagnosis not present

## 2017-11-11 DIAGNOSIS — Z0000 Encounter for general adult medical examination without abnormal findings: Secondary | ICD-10-CM | POA: Diagnosis not present

## 2017-11-11 DIAGNOSIS — Z87891 Personal history of nicotine dependence: Secondary | ICD-10-CM | POA: Diagnosis not present

## 2017-11-11 DIAGNOSIS — Z5181 Encounter for therapeutic drug level monitoring: Secondary | ICD-10-CM | POA: Diagnosis not present

## 2017-11-11 DIAGNOSIS — E119 Type 2 diabetes mellitus without complications: Secondary | ICD-10-CM | POA: Diagnosis not present

## 2017-11-12 ENCOUNTER — Other Ambulatory Visit (HOSPITAL_COMMUNITY): Payer: Self-pay | Admitting: Family Medicine

## 2017-11-12 DIAGNOSIS — Z87891 Personal history of nicotine dependence: Secondary | ICD-10-CM

## 2017-11-27 ENCOUNTER — Other Ambulatory Visit (HOSPITAL_COMMUNITY): Payer: PPO

## 2017-11-27 ENCOUNTER — Ambulatory Visit (HOSPITAL_COMMUNITY)
Admission: RE | Admit: 2017-11-27 | Discharge: 2017-11-27 | Disposition: A | Discharge status: Home / Self Care | Payer: PPO | Source: Ambulatory Visit | Attending: Family Medicine | Admitting: Family Medicine

## 2017-11-27 DIAGNOSIS — I700 Atherosclerosis of aorta: Secondary | ICD-10-CM | POA: Insufficient documentation

## 2017-11-27 DIAGNOSIS — Z87891 Personal history of nicotine dependence: Secondary | ICD-10-CM | POA: Insufficient documentation

## 2017-11-27 DIAGNOSIS — Z951 Presence of aortocoronary bypass graft: Secondary | ICD-10-CM | POA: Diagnosis not present

## 2017-11-27 DIAGNOSIS — I2510 Atherosclerotic heart disease of native coronary artery without angina pectoris: Secondary | ICD-10-CM | POA: Insufficient documentation

## 2017-11-27 DIAGNOSIS — J438 Other emphysema: Secondary | ICD-10-CM | POA: Diagnosis not present

## 2017-11-27 DIAGNOSIS — Z122 Encounter for screening for malignant neoplasm of respiratory organs: Secondary | ICD-10-CM | POA: Diagnosis not present

## 2017-11-27 DIAGNOSIS — J432 Centrilobular emphysema: Secondary | ICD-10-CM | POA: Insufficient documentation

## 2017-12-03 DIAGNOSIS — I739 Peripheral vascular disease, unspecified: Secondary | ICD-10-CM | POA: Diagnosis not present

## 2017-12-03 DIAGNOSIS — I2510 Atherosclerotic heart disease of native coronary artery without angina pectoris: Secondary | ICD-10-CM | POA: Diagnosis not present

## 2017-12-03 DIAGNOSIS — I6523 Occlusion and stenosis of bilateral carotid arteries: Secondary | ICD-10-CM | POA: Diagnosis not present

## 2017-12-03 DIAGNOSIS — R600 Localized edema: Secondary | ICD-10-CM | POA: Diagnosis not present

## 2017-12-16 DIAGNOSIS — E119 Type 2 diabetes mellitus without complications: Secondary | ICD-10-CM | POA: Diagnosis not present

## 2017-12-16 DIAGNOSIS — H43812 Vitreous degeneration, left eye: Secondary | ICD-10-CM | POA: Diagnosis not present

## 2017-12-31 DIAGNOSIS — I10 Essential (primary) hypertension: Secondary | ICD-10-CM | POA: Diagnosis not present

## 2018-01-07 DIAGNOSIS — R600 Localized edema: Secondary | ICD-10-CM | POA: Diagnosis not present

## 2018-01-07 DIAGNOSIS — I2510 Atherosclerotic heart disease of native coronary artery without angina pectoris: Secondary | ICD-10-CM | POA: Diagnosis not present

## 2018-01-07 DIAGNOSIS — I10 Essential (primary) hypertension: Secondary | ICD-10-CM | POA: Diagnosis not present

## 2018-01-07 DIAGNOSIS — I739 Peripheral vascular disease, unspecified: Secondary | ICD-10-CM | POA: Diagnosis not present

## 2018-01-16 DIAGNOSIS — I10 Essential (primary) hypertension: Secondary | ICD-10-CM | POA: Diagnosis not present

## 2018-02-23 DIAGNOSIS — I2510 Atherosclerotic heart disease of native coronary artery without angina pectoris: Secondary | ICD-10-CM | POA: Diagnosis not present

## 2018-02-23 DIAGNOSIS — I739 Peripheral vascular disease, unspecified: Secondary | ICD-10-CM | POA: Diagnosis not present

## 2018-02-23 DIAGNOSIS — I10 Essential (primary) hypertension: Secondary | ICD-10-CM | POA: Diagnosis not present

## 2018-02-23 DIAGNOSIS — R600 Localized edema: Secondary | ICD-10-CM | POA: Diagnosis not present

## 2018-03-02 DIAGNOSIS — Z7289 Other problems related to lifestyle: Secondary | ICD-10-CM | POA: Diagnosis not present

## 2018-03-02 DIAGNOSIS — Z87891 Personal history of nicotine dependence: Secondary | ICD-10-CM | POA: Diagnosis not present

## 2018-03-02 DIAGNOSIS — H6241 Otitis externa in other diseases classified elsewhere, right ear: Secondary | ICD-10-CM | POA: Diagnosis not present

## 2018-03-02 DIAGNOSIS — B369 Superficial mycosis, unspecified: Secondary | ICD-10-CM | POA: Diagnosis not present

## 2018-03-02 DIAGNOSIS — Z9089 Acquired absence of other organs: Secondary | ICD-10-CM | POA: Diagnosis not present

## 2018-03-02 DIAGNOSIS — Z974 Presence of external hearing-aid: Secondary | ICD-10-CM | POA: Diagnosis not present

## 2018-03-02 DIAGNOSIS — H7291 Unspecified perforation of tympanic membrane, right ear: Secondary | ICD-10-CM | POA: Diagnosis not present

## 2018-03-26 DIAGNOSIS — H7291 Unspecified perforation of tympanic membrane, right ear: Secondary | ICD-10-CM | POA: Diagnosis not present

## 2018-03-26 DIAGNOSIS — H90A31 Mixed conductive and sensorineural hearing loss, unilateral, right ear with restricted hearing on the contralateral side: Secondary | ICD-10-CM | POA: Diagnosis not present

## 2018-03-26 DIAGNOSIS — H60391 Other infective otitis externa, right ear: Secondary | ICD-10-CM | POA: Diagnosis not present

## 2018-05-12 DIAGNOSIS — M1A9XX0 Chronic gout, unspecified, without tophus (tophi): Secondary | ICD-10-CM | POA: Diagnosis not present

## 2018-05-12 DIAGNOSIS — K219 Gastro-esophageal reflux disease without esophagitis: Secondary | ICD-10-CM | POA: Diagnosis not present

## 2018-05-12 DIAGNOSIS — E119 Type 2 diabetes mellitus without complications: Secondary | ICD-10-CM | POA: Diagnosis not present

## 2018-05-12 DIAGNOSIS — D649 Anemia, unspecified: Secondary | ICD-10-CM | POA: Diagnosis not present

## 2018-05-12 DIAGNOSIS — I10 Essential (primary) hypertension: Secondary | ICD-10-CM | POA: Diagnosis not present

## 2018-05-12 DIAGNOSIS — Z1159 Encounter for screening for other viral diseases: Secondary | ICD-10-CM | POA: Diagnosis not present

## 2018-05-12 DIAGNOSIS — N401 Enlarged prostate with lower urinary tract symptoms: Secondary | ICD-10-CM | POA: Diagnosis not present

## 2018-05-12 DIAGNOSIS — Z5181 Encounter for therapeutic drug level monitoring: Secondary | ICD-10-CM | POA: Diagnosis not present

## 2018-05-12 DIAGNOSIS — R351 Nocturia: Secondary | ICD-10-CM | POA: Diagnosis not present

## 2018-05-12 DIAGNOSIS — Z125 Encounter for screening for malignant neoplasm of prostate: Secondary | ICD-10-CM | POA: Diagnosis not present

## 2018-05-12 DIAGNOSIS — E782 Mixed hyperlipidemia: Secondary | ICD-10-CM | POA: Diagnosis not present

## 2018-05-12 DIAGNOSIS — I639 Cerebral infarction, unspecified: Secondary | ICD-10-CM | POA: Diagnosis not present

## 2018-05-20 DIAGNOSIS — Z125 Encounter for screening for malignant neoplasm of prostate: Secondary | ICD-10-CM | POA: Diagnosis not present

## 2018-05-20 DIAGNOSIS — E119 Type 2 diabetes mellitus without complications: Secondary | ICD-10-CM | POA: Diagnosis not present

## 2018-05-20 DIAGNOSIS — Z5181 Encounter for therapeutic drug level monitoring: Secondary | ICD-10-CM | POA: Diagnosis not present

## 2018-05-20 DIAGNOSIS — I639 Cerebral infarction, unspecified: Secondary | ICD-10-CM | POA: Diagnosis not present

## 2018-05-20 DIAGNOSIS — I2510 Atherosclerotic heart disease of native coronary artery without angina pectoris: Secondary | ICD-10-CM | POA: Diagnosis not present

## 2018-05-20 DIAGNOSIS — M7542 Impingement syndrome of left shoulder: Secondary | ICD-10-CM | POA: Diagnosis not present

## 2018-05-20 DIAGNOSIS — E782 Mixed hyperlipidemia: Secondary | ICD-10-CM | POA: Diagnosis not present

## 2018-05-20 DIAGNOSIS — R351 Nocturia: Secondary | ICD-10-CM | POA: Diagnosis not present

## 2018-05-20 DIAGNOSIS — N401 Enlarged prostate with lower urinary tract symptoms: Secondary | ICD-10-CM | POA: Diagnosis not present

## 2018-05-20 DIAGNOSIS — I10 Essential (primary) hypertension: Secondary | ICD-10-CM | POA: Diagnosis not present

## 2018-05-20 DIAGNOSIS — M1A9XX0 Chronic gout, unspecified, without tophus (tophi): Secondary | ICD-10-CM | POA: Diagnosis not present

## 2018-05-20 DIAGNOSIS — K219 Gastro-esophageal reflux disease without esophagitis: Secondary | ICD-10-CM | POA: Diagnosis not present

## 2018-05-27 ENCOUNTER — Other Ambulatory Visit: Payer: Self-pay | Admitting: Cardiology

## 2018-05-27 DIAGNOSIS — R600 Localized edema: Secondary | ICD-10-CM

## 2018-05-27 DIAGNOSIS — M7542 Impingement syndrome of left shoulder: Secondary | ICD-10-CM | POA: Diagnosis not present

## 2018-05-27 DIAGNOSIS — M25512 Pain in left shoulder: Secondary | ICD-10-CM | POA: Diagnosis not present

## 2018-06-03 ENCOUNTER — Other Ambulatory Visit: Payer: Self-pay

## 2018-06-03 ENCOUNTER — Encounter (HOSPITAL_COMMUNITY): Payer: Self-pay | Admitting: Occupational Therapy

## 2018-06-03 ENCOUNTER — Ambulatory Visit (HOSPITAL_COMMUNITY): Payer: PPO | Attending: Family Medicine | Admitting: Occupational Therapy

## 2018-06-03 DIAGNOSIS — M25612 Stiffness of left shoulder, not elsewhere classified: Secondary | ICD-10-CM | POA: Diagnosis not present

## 2018-06-03 DIAGNOSIS — R29898 Other symptoms and signs involving the musculoskeletal system: Secondary | ICD-10-CM | POA: Diagnosis not present

## 2018-06-03 DIAGNOSIS — M25512 Pain in left shoulder: Secondary | ICD-10-CM

## 2018-06-03 NOTE — Patient Instructions (Signed)
  1) Flexion Wall Stretch    Face wall, place affected handon wall in front of you. Slide hand up the wall  and lean body in towards the wall. Hold for 10 seconds. Repeat 3-5 times. 1-2 times/day.     2) Towel Stretch with Internal Rotation   Or     Gently pull up (or to the side) your affected arm  behind your back with the assist of a towel. Hold 10 seconds, repeat 3-5 times. 1-2 times/day.             3) Corner Stretch    Stand at a corner of a wall, place your arms on the walls with elbows bent. Lean into the corner until a stretch is felt along the front of your chest and/or shoulders. Hold for 10 seconds. Repeat 3-5X, 1-2 times/day.    4) Posterior Capsule Stretch    Bring the involved arm across chest. Grasp elbow and pull toward chest until you feel a stretch in the back of the upper arm and shoulder. Hold 10 seconds. Repeat 3-5X. Complete 1-2 times/day.    5) Scapular Retraction    Tuck chin back as you pinch shoulder blades together.  Hold 5 seconds. Repeat 3-5X. Complete 1-2 times/day.    6) External Rotation Stretch:     Place your affected hand on the wall with the elbow bent and gently turn your body the opposite direction until a stretch is felt. Hold 10 seconds, repeat 3-5X. Complete 1-2 times/day.     

## 2018-06-03 NOTE — Therapy (Signed)
[place], [place], [address] Phone: [phone]   Fax:  [phone]  Occupational Therapy Evaluation  Patient Details  Name: Drew Harrison MRN: [id number] Date of Birth: [birth year] Referring Provider (OT): Dr. Terrill Mohr   Encounter Date: 06/03/2018  OT End of Session - 06/03/18 1337    Visit Number  1    Number of Visits  8    Date for OT Re-Evaluation  07/03/18    Authorization Type  [employer]    Authorization Time Period  no visit limit; $15 copay    OT Start Time  1300    OT Stop Time  1330    OT Time Calculation (min)  30 min    Activity Tolerance  Patient tolerated treatment well    Behavior During Therapy  [facility] for tasks assessed/performed       Past Medical History:  Diagnosis Date  . Abnormal nuclear stress test   . Arthritis   . Cellulitis and abscess of hand 05/2017  . Coronary artery disease    3-vessel  . Diabetes mellitus without complication ([place])   . GERD (gastroesophageal reflux disease)   . Gout   . Headache   . History of tobacco abuse   . Hyperlipidemia   . Hypertension   . Ischemic stroke ([place])   . Stroke ([place]) 03/29/2016   left facial droop  . Stroke due to embolism ([place])   . Visual disturbance     Past Surgical History:  Procedure Laterality Date  . CORONARY ARTERY BYPASS GRAFT N/A 06/17/2016   Procedure: CORONARY ARTERY BYPASS GRAFTING (CABG) x four using left internal mammary artery and right greater saphenous leg vein using endoscope.;  Surgeon: [name], MD;  Location: [facility];  Service: Open Heart Surgery;  Laterality: N/A;  . LEFT HEART CATH AND CORONARY ANGIOGRAPHY N/A 05/14/2016   Procedure: Left Heart Cath and Coronary Angiography;  Surgeon: Adrian Prows, MD;  Location: [place] [facility];  Service: Cardiovascular;  Laterality: N/A;  . LOWER EXTREMITY ANGIOGRAPHY N/A 01/21/2017   Procedure: Lower Extremity Angiography;  Surgeon: Adrian Prows, MD;   Location: [place] [facility];  Service: Cardiovascular;  Laterality: N/A;  . LOWER EXTREMITY ANGIOGRAPHY Left 08/26/2017   Procedure: LOWER EXTREMITY ANGIOGRAPHY;  Surgeon: Adrian Prows, MD;  Location: [place] [facility];  Service: Cardiovascular;  Laterality: Left;  . LOWER EXTREMITY ANGIOGRAPHY Right 09/23/2017   Procedure: LOWER EXTREMITY ANGIOGRAPHY;  Surgeon: Nigel Mormon, MD;  Location: [place] [facility];  Service: Cardiovascular;  Laterality: Right;  . PERIPHERAL VASCULAR ATHERECTOMY  08/26/2017   Procedure: PERIPHERAL VASCULAR ATHERECTOMY;  Surgeon: Adrian Prows, MD;  Location: [place] [facility];  Service: Cardiovascular;;  . PERIPHERAL VASCULAR ATHERECTOMY  09/23/2017   Procedure: PERIPHERAL VASCULAR ATHERECTOMY;  Surgeon: Nigel Mormon, MD;  Location: [place] [facility];  Service: Cardiovascular;;  . PERIPHERAL VASCULAR BALLOON ANGIOPLASTY  08/26/2017   Procedure: PERIPHERAL VASCULAR BALLOON ANGIOPLASTY;  Surgeon: Adrian Prows, MD;  Location: [place] [facility];  Service: Cardiovascular;;  . PERIPHERAL VASCULAR BALLOON ANGIOPLASTY  09/23/2017   Procedure: PERIPHERAL VASCULAR BALLOON ANGIOPLASTY;  Surgeon: Nigel Mormon, MD;  Location: [place] [facility];  Service: Cardiovascular;;  . TEE WITHOUT CARDIOVERSION N/A 06/17/2016   Procedure: TRANSESOPHAGEAL ECHOCARDIOGRAM (TEE);  Surgeon: [name], MD;  Location: [place] [facility];  Service: Open Heart Surgery;  Laterality: N/A;  . TONSILLECTOMY      There were no vitals  filed for this visit.  Subjective Assessment - 06/03/18 1322    Subjective   S: I need to get this arm working so I can get back to fishing.     Pertinent History  Pt is a 68 y/o male presenting with left shoulder pain that began approximately 1 month ago. Pt received a cortisone shot on 05/27/2018 after which his pain has improved. Pt was referred to occupational therapy for evaluation and treatment by Dr.     Danella Sensing Tests  FOTO Score: 66/100    Patient Stated Goals  To  improve my mobility for outdoor activities.     Currently in Pain?  Yes    Pain Score  3     Pain Location  Shoulder    Pain Orientation  Left    Pain Descriptors / Indicators  Aching;Sore    Pain Type  Acute pain    Pain Radiating Towards  N/A    Pain Onset  More than a month ago    Pain Frequency  Intermittent    Aggravating Factors   movement    Pain Relieving Factors  heat, pain medication    Effect of Pain on Daily Activities  mod effect on daily activities    Multiple Pain Sites  No        [facility] OT Assessment - 06/03/18 1256      Assessment   Medical Diagnosis  left shoulder impingment syndrome    Referring Provider (OT)  Dr. Terrill Mohr    Onset Date/Surgical Date  05/05/18    Hand Dominance  Left    Prior Therapy  None      Precautions   Precautions  None      Restrictions   Weight Bearing Restrictions  No      Balance Screen   Has the patient fallen in the past 6 months  No    Has the patient had a decrease in activity level because of a fear of falling?   No    Is the patient reluctant to leave their home because of a fear of falling?   No      Prior Function   Level of Independence  Independent    Vocation  Retired    Leisure  fishing, canoeing, Holiday representative, outdoor leisure      ADL   ADL comments  Pt is having difficulty with lifting overhead, reaching behind back, dressing, sleeping      Written Expression   Dominant Hand  Left      Cognition   Overall Cognitive Status  Within Functional Limits for tasks assessed      Observation/Other Assessments   Focus on Therapeutic Outcomes (FOTO)   66/100      ROM / Strength   AROM / PROM / Strength  AROM;PROM;Strength      Palpation   Palpation comment  Minimal fascial restrictions in left upper arm and anterior deltoid regions      AROM   Overall AROM Comments  Assessed seated, er/IR adducted    AROM Assessment Site  Shoulder    Right/Left Shoulder  Left    Left Shoulder Flexion  125 Degrees     Left Shoulder ABduction  126 Degrees    Left Shoulder Internal Rotation  90 Degrees    Left Shoulder External Rotation  42 Degrees      PROM   Overall PROM Comments  Assessed supine, er/IR adducted    PROM Assessment Site  Shoulder  Right/Left Shoulder  Left    Left Shoulder Flexion  131 Degrees    Left Shoulder ABduction  125 Degrees    Left Shoulder Internal Rotation  90 Degrees    Left Shoulder External Rotation  71 Degrees      Strength   Overall Strength Comments  Assessed seated, er/IR adducted    Strength Assessment Site  Shoulder    Right/Left Shoulder  Left    Left Shoulder Flexion  4+/5    Left Shoulder ABduction  4-/5    Left Shoulder Internal Rotation  5/5    Left Shoulder External Rotation  4+/5                      OT Education - 06/03/18 1321    Education Details  shoulder stretches    Person(s) Educated  Patient    Methods  Explanation;Demonstration;Handout    Comprehension  Verbalized understanding;Returned demonstration       OT Short Term Goals - 06/03/18 1341      OT SHORT TERM GOAL #1   Title  Pt will be provided with and educated on HEP to improve mobility required for ADL completion using LUE as dominant.     Time  4    Period  Weeks    Status  New    Target Date  07/03/18      OT SHORT TERM GOAL #2   Title  Pt will decrease LUE fascial restrictions to trace amounts to improve mobility required for functional reaching tasks.     Time  4    Period  Weeks    Status  New      OT SHORT TERM GOAL #3   Title  Pt will increase LUE A/ROM to [facility] to improve ability to perform mobility tasks required for fishing.    Time  4    Period  Weeks    Status  New      OT SHORT TERM GOAL #4   Title  Pt will increase LUE to 4+/5 to improve strength required for paddling canoe.     Time  4    Period  Weeks    Status  New      OT SHORT TERM GOAL #5   Title  Pt will decrease LUE pain to 3/10 or less to improve ability to perform reaching  tasks without compensatory movements.     Time  4    Period  Weeks    Status  New               Plan - 06/03/18 1337    Clinical Impression Statement  P: Pt is a 68 y/o male presenting with left shoulder impingment limiting participation in daily and leisure tasks. Pt reports improvement since cortisone shot, however is continuing to experience difficulty with reaching and lifting tasks.     OT Occupational Profile and History  Problem Focused Assessment - Including review of records relating to presenting problem    Occupational performance deficits (Please refer to evaluation for details):  ADL's;IADL's;Rest and Sleep;Leisure    Body Structure / Function / Physical Skills  ADL;Strength;Pain;UE functional use;IADL;ROM;Fascial restriction;Flexibility    Rehab Potential  Good    Clinical Decision Making  Limited treatment options, no task modification necessary    Comorbidities Affecting Occupational Performance:  May have comorbidities impacting occupational performance    Modification or Assistance to Complete Evaluation   No modification of tasks or assist necessary  to complete eval    OT Frequency  2x / week    OT Duration  4 weeks    OT Treatment/Interventions  Self-care/ADL training;Moist Heat;Therapeutic activities;Ultrasound;Therapeutic exercise;Cryotherapy;Passive range of motion;Electrical Stimulation;Manual Therapy;Patient/family education    Plan  P: Pt will benefit from skilled OT services to decrease pain and fascial restrictions, increase ROM, strength, and functional task completion using LUE as dominant. Treatment plan: myofascial release, manual techniques, P/ROM, A/ROM, general LUE strengthening and stability, modalities prn    Consulted and Agree with Plan of Care  Patient       Patient will benefit from skilled therapeutic intervention in order to improve the following deficits and impairments:  Body Structure / Function / Physical Skills  Visit Diagnosis: Acute  pain of left shoulder  Stiffness of left shoulder, not elsewhere classified  Other symptoms and signs involving the musculoskeletal system    Problem List Patient Active Problem List   Diagnosis Date Noted  . Diabetes ([place]) 06/17/2017  . Cellulitis 06/17/2017  . Claudication in peripheral vascular disease ([place]) 01/19/2017  . Visual disturbance 06/26/2016  . Stroke due to embolism ([place]) 06/26/2016  . 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 3-vessel CAD 06/04/2016  . Abnormal nuclear stress test 04/08/2016  . Cerebrovascular accident (CVA) ([place]) 03/29/2016  . Ischemic stroke ([place]) 03/29/2016  . Essential hypertension   . H/O tobacco use, presenting hazards to health    Guadelupe Sabin, OTR/L  [phone] 06/03/2018, 1:50 PM  [place], [place], [address] Phone: [phone]   Fax:  [phone]  Name: Drew Harrison MRN: [id number] Date of Birth: 01-27-1951

## 2018-06-10 ENCOUNTER — Encounter (HOSPITAL_COMMUNITY): Payer: Self-pay | Admitting: Occupational Therapy

## 2018-06-10 ENCOUNTER — Ambulatory Visit (HOSPITAL_COMMUNITY): Payer: PPO | Admitting: Occupational Therapy

## 2018-06-10 ENCOUNTER — Other Ambulatory Visit: Payer: Self-pay

## 2018-06-10 DIAGNOSIS — R29898 Other symptoms and signs involving the musculoskeletal system: Secondary | ICD-10-CM

## 2018-06-10 DIAGNOSIS — M25612 Stiffness of left shoulder, not elsewhere classified: Secondary | ICD-10-CM

## 2018-06-10 DIAGNOSIS — M25512 Pain in left shoulder: Secondary | ICD-10-CM

## 2018-06-10 NOTE — Therapy (Signed)
[place], [place], [address] Phone: [phone]   Fax:  [phone]  Occupational Therapy Treatment  Patient Details  Name: Drew Harrison MRN: [id number] Date of Birth: [birth year] Referring Provider (OT): Dr. Terrill Mohr   Encounter Date: 06/10/2018  OT End of Session - 06/10/18 1030    Visit Number  2    Number of Visits  8    Date for OT Re-Evaluation  07/03/18    Authorization Type  [employer]    Authorization Time Period  no visit limit; $15 copay    OT Start Time  0948    OT Stop Time  1026    OT Time Calculation (min)  38 min    Activity Tolerance  Patient tolerated treatment well    Behavior During Therapy  [facility] for tasks assessed/performed       Past Medical History:  Diagnosis Date  . Abnormal nuclear stress test   . Arthritis   . Cellulitis and abscess of hand 05/2017  . Coronary artery disease    3-vessel  . Diabetes mellitus without complication ([place])   . GERD (gastroesophageal reflux disease)   . Gout   . Headache   . History of tobacco abuse   . Hyperlipidemia   . Hypertension   . Ischemic stroke ([place])   . Stroke ([place]) 03/29/2016   left facial droop  . Stroke due to embolism ([place])   . Visual disturbance     Past Surgical History:  Procedure Laterality Date  . CORONARY ARTERY BYPASS GRAFT N/A 06/17/2016   Procedure: CORONARY ARTERY BYPASS GRAFTING (CABG) x four using left internal mammary artery and right greater saphenous leg vein using endoscope.;  Surgeon: [name], MD;  Location: [facility];  Service: Open Heart Surgery;  Laterality: N/A;  . LEFT HEART CATH AND CORONARY ANGIOGRAPHY N/A 05/14/2016   Procedure: Left Heart Cath and Coronary Angiography;  Surgeon: Adrian Prows, MD;  Location: [place] [facility];  Service: Cardiovascular;  Laterality: N/A;  . LOWER EXTREMITY ANGIOGRAPHY N/A 01/21/2017   Procedure: Lower Extremity Angiography;  Surgeon: Adrian Prows, MD;   Location: [place] [facility];  Service: Cardiovascular;  Laterality: N/A;  . LOWER EXTREMITY ANGIOGRAPHY Left 08/26/2017   Procedure: LOWER EXTREMITY ANGIOGRAPHY;  Surgeon: Adrian Prows, MD;  Location: [place] [facility];  Service: Cardiovascular;  Laterality: Left;  . LOWER EXTREMITY ANGIOGRAPHY Right 09/23/2017   Procedure: LOWER EXTREMITY ANGIOGRAPHY;  Surgeon: Nigel Mormon, MD;  Location: [facility] [facility];  Service: Cardiovascular;  Laterality: Right;  . PERIPHERAL VASCULAR ATHERECTOMY  08/26/2017   Procedure: PERIPHERAL VASCULAR ATHERECTOMY;  Surgeon: Adrian Prows, MD;  Location: [facility] [facility];  Service: Cardiovascular;;  . PERIPHERAL VASCULAR ATHERECTOMY  09/23/2017   Procedure: PERIPHERAL VASCULAR ATHERECTOMY;  Surgeon: Nigel Mormon, MD;  Location: [place] [facility];  Service: Cardiovascular;;  . PERIPHERAL VASCULAR BALLOON ANGIOPLASTY  08/26/2017   Procedure: PERIPHERAL VASCULAR BALLOON ANGIOPLASTY;  Surgeon: Adrian Prows, MD;  Location: [facility];  Service: Cardiovascular;;  . PERIPHERAL VASCULAR BALLOON ANGIOPLASTY  09/23/2017   Procedure: PERIPHERAL VASCULAR BALLOON ANGIOPLASTY;  Surgeon: Nigel Mormon, MD;  Location: [place] [facility];  Service: Cardiovascular;;  . TEE WITHOUT CARDIOVERSION N/A 06/17/2016   Procedure: TRANSESOPHAGEAL ECHOCARDIOGRAM (TEE);  Surgeon: [name], MD;  Location: [facility];  Service: Open Heart Surgery;  Laterality: N/A;  . TONSILLECTOMY      There were no vitals  filed for this visit.  Subjective Assessment - 06/10/18 0945    Subjective   S: It feels significantly better.     Currently in Pain?  No/denies         [facility] OT Assessment - 06/10/18 0945      Assessment   Medical Diagnosis  left shoulder impingment syndrome      Precautions   Precautions  None               OT Treatments/Exercises (OP) - 06/10/18 0950      Exercises   Exercises  Shoulder      Shoulder Exercises: Supine   Protraction  PROM;5  reps;AROM;10 reps    Horizontal ABduction  PROM;5 reps;AROM;10 reps    External Rotation  PROM;5 reps;AROM;10 reps    Internal Rotation  PROM;5 reps;AROM;10 reps    Flexion  PROM;5 reps;AROM;10 reps    ABduction  PROM;5 reps;AROM;10 reps      Shoulder Exercises: Standing   Extension  Theraband;10 reps    Theraband Level (Shoulder Extension)  Level 2 (Red)    Row  Theraband;10 reps    Theraband Level (Shoulder Row)  Level 2 (Red)    Retraction  Theraband;10 reps    Theraband Level (Shoulder Retraction)  Level 2 (Red)      Shoulder Exercises: Stretch   Corner Stretch  1 rep;10 seconds    Cross Chest Stretch  2 reps;10 seconds    Wall Stretch - Flexion  1 rep;10 seconds                        OT Short Term Goals - 06/10/18 1006      OT SHORT TERM GOAL #1   Title  Pt will be provided with and educated on HEP to improve mobility required for ADL completion using LUE as dominant.     Time  4    Period  Weeks    Status  On-going    Target Date  07/03/18      OT SHORT TERM GOAL #2   Title  Pt will decrease LUE fascial restrictions to trace amounts to improve mobility required for functional reaching tasks.     Time  4    Period  Weeks    Status  On-going      OT SHORT TERM GOAL #3   Title  Pt will increase LUE A/ROM to [facility] to improve ability to perform mobility tasks required for fishing.    Time  4    Period  Weeks    Status  On-going      OT SHORT TERM GOAL #4   Title  Pt will increase LUE to 4+/5 to improve strength required for paddling canoe.     Time  4    Period  Weeks    Status  On-going      OT SHORT TERM GOAL #5   Title  Pt will decrease LUE pain to 3/10 or less to improve ability to perform reaching tasks without compensatory movements.     Time  4    Period  Weeks    Status  On-going               Plan - 06/10/18 1006    Clinical Impression Statement  A: Pt reports improvement in ROM since beginning shoulder stretches. Initiated A/ROM  today, pt is limited at approximately 75-80% range with pain across the top of the shoulder immediately  medial to the acromion. Also initiated scapular theraband and reviewed shoulder stretches. Pt reporting dizziness with flexion stretch, corrected form and dizziness improved. Verbal cuing for form and technique during session.     Body Structure / Function / Physical Skills  ADL;Strength;Pain;UE functional use;IADL;ROM;Fascial restriction;Flexibility    Plan  P: Add standing A/ROM and update HEP for A/ROM       Patient will benefit from skilled therapeutic intervention in order to improve the following deficits and impairments:  Body Structure / Function / Physical Skills  Visit Diagnosis: Acute pain of left shoulder  Stiffness of left shoulder, not elsewhere classified  Other symptoms and signs involving the musculoskeletal system    Problem List Patient Active Problem List   Diagnosis Date Noted  . Diabetes ([place]) 06/17/2017  . Cellulitis 06/17/2017  . Claudication in peripheral vascular disease ([place]) 01/19/2017  . Visual disturbance 06/26/2016  . Stroke due to embolism ([place]) 06/26/2016  . 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 3-vessel CAD 06/04/2016  . Abnormal nuclear stress test 04/08/2016  . Cerebrovascular accident (CVA) ([place]) 03/29/2016  . Ischemic stroke ([place]) 03/29/2016  . Essential hypertension   . H/O tobacco use, presenting hazards to health    Guadelupe Sabin, OTR/L  [phone] 06/10/2018, 10:35 AM  [place], [place], [address] Phone: [phone]   Fax:  [phone]  Name: Drew Harrison MRN: [id number] Date of Birth: 1950/10/31

## 2018-06-11 ENCOUNTER — Other Ambulatory Visit: Payer: Self-pay

## 2018-06-11 ENCOUNTER — Encounter (HOSPITAL_COMMUNITY): Payer: Self-pay | Admitting: Occupational Therapy

## 2018-06-11 ENCOUNTER — Ambulatory Visit (HOSPITAL_COMMUNITY): Payer: PPO | Admitting: Occupational Therapy

## 2018-06-11 DIAGNOSIS — M25512 Pain in left shoulder: Secondary | ICD-10-CM | POA: Diagnosis not present

## 2018-06-11 DIAGNOSIS — M25612 Stiffness of left shoulder, not elsewhere classified: Secondary | ICD-10-CM

## 2018-06-11 DIAGNOSIS — R29898 Other symptoms and signs involving the musculoskeletal system: Secondary | ICD-10-CM

## 2018-06-11 NOTE — Patient Instructions (Signed)

## 2018-06-11 NOTE — Therapy (Signed)
Drew [place], [place], [address] Phone: [phone]   Fax:  [phone]  Occupational Therapy Treatment  Patient Details  Name: Drew Harrison MRN: [id number] Date of Birth: 15-Jan-1951 Referring Provider (OT): Dr. Terrill Mohr   Encounter Date: 06/11/2018  OT End of Session - 06/11/18 1028    Visit Number  3    Number of Visits  8    Date for OT Re-Evaluation  07/03/18    Authorization Type  [employer]    Authorization Time Period  no visit limit; $15 copay    OT Start Time  0947    OT Stop Time  1028    OT Time Calculation (min)  41 min    Activity Tolerance  Patient tolerated treatment well    Behavior During Therapy  [facility] for tasks assessed/performed       Past Medical History:  Diagnosis Date  . Abnormal nuclear stress test   . Arthritis   . Cellulitis and abscess of hand 05/2017  . Coronary artery disease    3-vessel  . Diabetes mellitus without complication ([place])   . GERD (gastroesophageal reflux disease)   . Gout   . Headache   . History of tobacco abuse   . Hyperlipidemia   . Hypertension   . Ischemic stroke ([place])   . Stroke ([place]) 03/29/2016   left facial droop  . Stroke due to embolism ([place])   . Visual disturbance     Past Surgical History:  Procedure Laterality Date  . CORONARY ARTERY BYPASS GRAFT N/A 06/17/2016   Procedure: CORONARY ARTERY BYPASS GRAFTING (CABG) x four using left internal mammary artery and right greater saphenous leg vein using endoscope.;  Surgeon: [name], MD;  Location: [facility];  Service: Open Heart Surgery;  Laterality: N/A;  . LEFT HEART CATH AND CORONARY ANGIOGRAPHY N/A 05/14/2016   Procedure: Left Heart Cath and Coronary Angiography;  Surgeon: Adrian Prows, MD;  Location: [place] [facility];  Service: Cardiovascular;  Laterality: N/A;  . LOWER EXTREMITY ANGIOGRAPHY N/A 01/21/2017   Procedure: Lower Extremity Angiography;  Surgeon: Adrian Prows, MD;   Location: [place] [facility];  Service: Cardiovascular;  Laterality: N/A;  . LOWER EXTREMITY ANGIOGRAPHY Left 08/26/2017   Procedure: LOWER EXTREMITY ANGIOGRAPHY;  Surgeon: Adrian Prows, MD;  Location: [place] [facility];  Service: Cardiovascular;  Laterality: Left;  . LOWER EXTREMITY ANGIOGRAPHY Right 09/23/2017   Procedure: LOWER EXTREMITY ANGIOGRAPHY;  Surgeon: Nigel Mormon, MD;  Location: [place] [facility];  Service: Cardiovascular;  Laterality: Right;  . PERIPHERAL VASCULAR ATHERECTOMY  08/26/2017   Procedure: PERIPHERAL VASCULAR ATHERECTOMY;  Surgeon: Adrian Prows, MD;  Location: [place] [facility];  Service: Cardiovascular;;  . PERIPHERAL VASCULAR ATHERECTOMY  09/23/2017   Procedure: PERIPHERAL VASCULAR ATHERECTOMY;  Surgeon: Nigel Mormon, MD;  Location: [place] [facility];  Service: Cardiovascular;;  . PERIPHERAL VASCULAR BALLOON ANGIOPLASTY  08/26/2017   Procedure: PERIPHERAL VASCULAR BALLOON ANGIOPLASTY;  Surgeon: Adrian Prows, MD;  Location: [place] [facility];  Service: Cardiovascular;;  . PERIPHERAL VASCULAR BALLOON ANGIOPLASTY  09/23/2017   Procedure: PERIPHERAL VASCULAR BALLOON ANGIOPLASTY;  Surgeon: Nigel Mormon, MD;  Location: [place] [facility];  Service: Cardiovascular;;  . TEE WITHOUT CARDIOVERSION N/A 06/17/2016   Procedure: TRANSESOPHAGEAL ECHOCARDIOGRAM (TEE);  Surgeon: [name], MD;  Location: [facility];  Service: Open Heart Surgery;  Laterality: N/A;  . TONSILLECTOMY      There were no vitals  filed for this visit.  Subjective Assessment - 06/11/18 0946    Subjective   S: It's not to the point of real pain it's just sore and stiff.     Currently in Pain?  Yes    Pain Score  5     Pain Location  Shoulder    Pain Orientation  Left    Pain Descriptors / Indicators  Sore;Discomfort    Pain Type  Acute pain    Pain Radiating Towards  N/A    Pain Onset  More than a month ago    Pain Frequency  Intermittent    Aggravating Factors   movement    Pain Relieving  Factors  heat, pain medication    Effect of Pain on Daily Activities  mod effect on ADLs    Multiple Pain Sites  No         [facility] OT Assessment - 06/11/18 0945      Assessment   Medical Diagnosis  left shoulder impingment syndrome      Precautions   Precautions  None               OT Treatments/Exercises (OP) - 06/11/18 0949      Exercises   Exercises  Shoulder      Shoulder Exercises: Supine   Protraction  PROM;5 reps;AROM;10 reps    Horizontal ABduction  PROM;5 reps;AROM;10 reps    External Rotation  PROM;5 reps;AROM;10 reps    Internal Rotation  PROM;5 reps;AROM;10 reps    Flexion  PROM;5 reps;AROM;10 reps    ABduction  PROM;5 reps;AROM;10 reps      Shoulder Exercises: Standing   Protraction  AROM;10 reps    Horizontal ABduction  AROM;10 reps    External Rotation  AROM;10 reps    Internal Rotation  AROM;10 reps    Flexion  AROM;10 reps    ABduction  AROM;10 reps      Shoulder Exercises: Pulleys   Flexion  1 minute    ABduction  1 minute      Shoulder Exercises: ROM/Strengthening   Proximal Shoulder Strengthening, Supine  10X each no rest breaks      Manual Therapy   Manual Therapy  Myofascial release    Manual therapy comments  completed separately from therapeutic exercises    Myofascial Release  myofascial release to left upper arm, trapezius, and scapularis regions to decrease pain and fascial restrictions and increase joint ROM             OT Education - 06/11/18 1007    Education Details  A/ROM    Person(s) Educated  Patient    Methods  Explanation;Demonstration;Handout    Comprehension  Verbalized understanding;Returned demonstration       OT Short Term Goals - 06/10/18 1006      OT SHORT TERM GOAL #1   Title  Pt will be provided with and educated on HEP to improve mobility required for ADL completion using LUE as dominant.     Time  4    Period  Weeks    Status  On-going    Target Date  07/03/18      OT SHORT TERM GOAL #2    Title  Pt will decrease LUE fascial restrictions to trace amounts to improve mobility required for functional reaching tasks.     Time  4    Period  Weeks    Status  On-going      OT SHORT TERM GOAL #3  Title  Pt will increase LUE A/ROM to [facility] to improve ability to perform mobility tasks required for fishing.    Time  4    Period  Weeks    Status  On-going      OT SHORT TERM GOAL #4   Title  Pt will increase LUE to 4+/5 to improve strength required for paddling canoe.     Time  4    Period  Weeks    Status  On-going      OT SHORT TERM GOAL #5   Title  Pt will decrease LUE pain to 3/10 or less to improve ability to perform reaching tasks without compensatory movements.     Time  4    Period  Weeks    Status  On-going               Plan - 06/11/18 1005    Clinical Impression Statement  A Pt reports soreness after session yesterday. Manual therapy completed today to address fascial restrictions along anterior deltoid and medial border of scapula. Continued with A/ROM today adding in standing, also added proximal shoulder strengthening in supine. Pt with pain at end range during flexion and abduction, OT notes BUE ROM is very similar. Verbal cuing for form and technique.     Body Structure / Function / Physical Skills  ADL;Strength;Pain;UE functional use;IADL;ROM;Fascial restriction;Flexibility    Plan  P: Follow up on HEP, add proximal shoulder strengthening in standing       Patient will benefit from skilled therapeutic intervention in order to improve the following deficits and impairments:  Body Structure / Function / Physical Skills  Visit Diagnosis: Acute pain of left shoulder  Stiffness of left shoulder, not elsewhere classified  Other symptoms and signs involving the musculoskeletal system    Problem List Patient Active Problem List   Diagnosis Date Noted  . Diabetes ([place]) 06/17/2017  . Cellulitis 06/17/2017  . Claudication in peripheral vascular disease  ([place]) 01/19/2017  . Visual disturbance 06/26/2016  . Stroke due to embolism ([place]) 06/26/2016  . 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 3-vessel CAD 06/04/2016  . Abnormal nuclear stress test 04/08/2016  . Cerebrovascular accident (CVA) ([place]) 03/29/2016  . Ischemic stroke ([place]) 03/29/2016  . Essential hypertension   . H/O tobacco use, presenting hazards to health    Guadelupe Sabin, OTR/L  [phone] 06/11/2018, 10:29 AM  [place], [place], [address] Phone: [phone]   Fax:  [phone]  Name: Drew Harrison MRN: [id number] Date of Birth: [birth year]

## 2018-06-12 ENCOUNTER — Telehealth (HOSPITAL_COMMUNITY): Payer: Self-pay | Admitting: Occupational Therapy

## 2018-06-12 NOTE — Telephone Encounter (Signed)
Called and spoke with pt regarding 2 week clinic closure for COVID-19 precautions. Discussed HEP with pt, no questions at this time.    Drew Harrison, OTR/L  [phone] 06/12/2018

## 2018-06-15 ENCOUNTER — Ambulatory Visit (HOSPITAL_COMMUNITY): Payer: PPO | Admitting: Specialist

## 2018-06-24 ENCOUNTER — Encounter (HOSPITAL_COMMUNITY): Payer: PPO | Admitting: Occupational Therapy

## 2018-06-25 ENCOUNTER — Telehealth (HOSPITAL_COMMUNITY): Payer: Self-pay | Admitting: Occupational Therapy

## 2018-06-25 NOTE — Telephone Encounter (Signed)
Patient was contacted today regarding the temporary reduction of OP rehab services due to concerns for community transmission of Covid-19.   Left message for pt to return call to discuss HEP, update as needed, and provide information on telehealth visits if appropriate.    Guadelupe Sabin, OTR/L  [phone] 06/25/2018

## 2018-06-26 ENCOUNTER — Encounter (HOSPITAL_COMMUNITY): Payer: PPO

## 2018-06-26 DIAGNOSIS — J069 Acute upper respiratory infection, unspecified: Secondary | ICD-10-CM | POA: Diagnosis not present

## 2018-06-26 DIAGNOSIS — Z13228 Encounter for screening for other metabolic disorders: Secondary | ICD-10-CM | POA: Diagnosis not present

## 2018-06-26 DIAGNOSIS — B9789 Other viral agents as the cause of diseases classified elsewhere: Secondary | ICD-10-CM | POA: Diagnosis not present

## 2018-06-30 ENCOUNTER — Encounter (HOSPITAL_COMMUNITY): Payer: PPO

## 2018-07-02 ENCOUNTER — Encounter (HOSPITAL_COMMUNITY): Payer: PPO | Admitting: Occupational Therapy

## 2018-07-27 ENCOUNTER — Other Ambulatory Visit: Payer: PPO

## 2018-07-31 ENCOUNTER — Telehealth (HOSPITAL_COMMUNITY): Payer: Self-pay | Admitting: Occupational Therapy

## 2018-07-31 NOTE — Telephone Encounter (Signed)
Called pt to offer in-clinic reassessment for shoulder follow up. Left message asking pt to return call.    Guadelupe Sabin, OTR/L  [phone] 07/31/2018

## 2018-08-03 ENCOUNTER — Ambulatory Visit: Payer: PPO | Admitting: Cardiology

## 2018-08-20 ENCOUNTER — Ambulatory Visit (INDEPENDENT_AMBULATORY_CARE_PROVIDER_SITE_OTHER): Payer: PPO

## 2018-08-20 ENCOUNTER — Other Ambulatory Visit: Payer: Self-pay

## 2018-08-20 DIAGNOSIS — R600 Localized edema: Secondary | ICD-10-CM

## 2018-08-24 ENCOUNTER — Encounter: Payer: Self-pay | Admitting: Cardiology

## 2018-08-25 ENCOUNTER — Encounter: Payer: Self-pay | Admitting: Cardiology

## 2018-08-25 ENCOUNTER — Other Ambulatory Visit: Payer: Self-pay

## 2018-08-25 ENCOUNTER — Ambulatory Visit: Payer: PPO | Admitting: Cardiology

## 2018-08-25 VITALS — Ht 72.0 in | Wt 235.0 lb

## 2018-08-25 DIAGNOSIS — I2510 Atherosclerotic heart disease of native coronary artery without angina pectoris: Secondary | ICD-10-CM

## 2018-08-25 DIAGNOSIS — Z794 Long term (current) use of insulin: Secondary | ICD-10-CM

## 2018-08-25 DIAGNOSIS — E1142 Type 2 diabetes mellitus with diabetic polyneuropathy: Secondary | ICD-10-CM

## 2018-08-25 DIAGNOSIS — I6930 Unspecified sequelae of cerebral infarction: Secondary | ICD-10-CM

## 2018-08-25 DIAGNOSIS — Z951 Presence of aortocoronary bypass graft: Secondary | ICD-10-CM

## 2018-08-25 DIAGNOSIS — I739 Peripheral vascular disease, unspecified: Secondary | ICD-10-CM | POA: Diagnosis not present

## 2018-08-25 DIAGNOSIS — I6523 Occlusion and stenosis of bilateral carotid arteries: Secondary | ICD-10-CM | POA: Diagnosis not present

## 2018-08-25 MED ORDER — SPIRONOLACTONE 25 MG PO TABS: 25.0000 mg | ORAL_TABLET | Freq: Every day | ORAL | 3 refills | Status: DC

## 2018-08-25 NOTE — Progress Notes (Signed)
Primary Physician:  Nickola Major, MD   Patient ID: Drew Harrison, male    DOB: [birth year], 68 y.o.   MRN: [id number]  Subjective:    No chief complaint on file.  This visit type was conducted due to national recommendations for restrictions regarding the COVID-19 Pandemic (e.g. social distancing).  This format is felt to be most appropriate for this patient at this time.  All issues noted in this document were discussed and addressed.  No physical exam was performed (except for noted visual exam findings with Telehealth visits).  The patient has consented to conduct a Telehealth visit and understands insurance will be billed.   I discussed the limitations of evaluation and management by telemedicine and the availability of in person appointments. The patient expressed understanding and agreed to proceed.  Virtual Visit via Video Note is as below  I connected with Drew Harrison, on 08/25/18 at 1045 by telephone and verified that I am speaking with the correct person using two identifiers. Unable to perform video visit as patient did not have equipment.    I have discussed with the patient regarding the safety during COVID Pandemic and steps and precautions including social distancing with the patient.    HPI: Drew Harrison  is a 68 y.o. male  with  coronary artery disease s/p CABGX4 (05/2016), post op CVA with no significant neurodeficit, hypertension, type 2 DM, bilateral mod carotid stenosis, severe LE PAD.  He has had left popliteal and distal to be trunk angioplasty on 08/26/2017 and also right DP trunk and PTCA and peroneal artery angioplasty on 09/23/2017.  Patient was last seen in Jan 2020, he recently underwent ABI and presents to discuss results. He is without complaints. Has not had further leg edema. Has some mild claudication symptoms that he attributes to not be active recently. Does not feel that one leg is worse than the other. He has now retired. States that his blood  pressure was checked recently at the dentist and was noted to be around 211 systolic. Does not monitor at home. Tolerating medicaitons well.   Past Medical History:  Diagnosis Date  . Abnormal nuclear stress test   . Arthritis   . Cellulitis and abscess of hand 05/2017  . Coronary artery disease    3-vessel  . Diabetes mellitus without complication ([place])   . GERD (gastroesophageal reflux disease)   . Gout   . Headache   . History of tobacco abuse   . Hyperlipidemia   . Hypertension   . Ischemic stroke ([place])   . Stroke ([place]) 03/29/2016   left facial droop  . Stroke due to embolism ([place])   . Visual disturbance     Past Surgical History:  Procedure Laterality Date  . CORONARY ARTERY BYPASS GRAFT N/A 06/17/2016   Procedure: CORONARY ARTERY BYPASS GRAFTING (CABG) x four using left internal mammary artery and right greater saphenous leg vein using endoscope.;  Surgeon: [name], MD;  Location: [facility];  Service: Open Heart Surgery;  Laterality: N/A;  . LEFT HEART CATH AND CORONARY ANGIOGRAPHY N/A 05/14/2016   Procedure: Left Heart Cath and Coronary Angiography;  Surgeon: Adrian Prows, MD;  Location: [facility];  Service: Cardiovascular;  Laterality: N/A;  . LOWER EXTREMITY ANGIOGRAPHY N/A 01/21/2017   Procedure: Lower Extremity Angiography;  Surgeon: Adrian Prows, MD;  Location: [facility];  Service: Cardiovascular;  Laterality: N/A;  . LOWER EXTREMITY ANGIOGRAPHY Left 08/26/2017   Procedure: LOWER EXTREMITY  ANGIOGRAPHY;  Surgeon: Adrian Prows, MD;  Location: [facility];  Service: Cardiovascular;  Laterality: Left;  . LOWER EXTREMITY ANGIOGRAPHY Right 09/23/2017   Procedure: LOWER EXTREMITY ANGIOGRAPHY;  Surgeon: Nigel Mormon, MD;  Location: [facility];  Service: Cardiovascular;  Laterality: Right;  . PERIPHERAL VASCULAR ATHERECTOMY  08/26/2017   Procedure: PERIPHERAL VASCULAR ATHERECTOMY;  Surgeon: Adrian Prows, MD;  Location: [facility];  Service:  Cardiovascular;;  . PERIPHERAL VASCULAR ATHERECTOMY  09/23/2017   Procedure: PERIPHERAL VASCULAR ATHERECTOMY;  Surgeon: Nigel Mormon, MD;  Location: [facility];  Service: Cardiovascular;;  . PERIPHERAL VASCULAR BALLOON ANGIOPLASTY  08/26/2017   Procedure: PERIPHERAL VASCULAR BALLOON ANGIOPLASTY;  Surgeon: Adrian Prows, MD;  Location: [facility];  Service: Cardiovascular;;  . PERIPHERAL VASCULAR BALLOON ANGIOPLASTY  09/23/2017   Procedure: PERIPHERAL VASCULAR BALLOON ANGIOPLASTY;  Surgeon: Nigel Mormon, MD;  Location: [facility];  Service: Cardiovascular;;  . TEE WITHOUT CARDIOVERSION N/A 06/17/2016   Procedure: TRANSESOPHAGEAL ECHOCARDIOGRAM (TEE);  Surgeon: [name], MD;  Location: [facility];  Service: Open Heart Surgery;  Laterality: N/A;  . TONSILLECTOMY      Social History   Socioeconomic History  . Marital status: Divorced    Spouse name: Not on file  . Number of children: 1  . Years of education: Not on file  . Highest education level: Not on file  Occupational History  . Occupation: Financial trader: [employer]  Social Needs  . Financial resource strain: Not on file  . Food insecurity:    Worry: Not on file    Inability: Not on file  . Transportation needs:    Medical: Not on file    Non-medical: Not on file  Tobacco Use  . Smoking status: Former Smoker    Packs/day: 1.50    Years: 48.00    Pack years: 72.00    Types: Cigarettes    Last attempt to quit: 03/29/2016    Years since quitting: 2.4  . Smokeless tobacco: Never Used  Substance and Sexual Activity  . Alcohol use: Yes    Alcohol/week: 14.0 standard drinks    Types: 14 Cans of beer per week  . Drug use: Yes    Types: Cocaine    Comment: last usage 12 yrs.  . Sexual activity: Not on file  Lifestyle  . Physical activity:    Days per week: Not on file    Minutes per session: Not on file  . Stress: Not on file  Relationships  . Social connections:    Talks on phone:  Not on file    Gets together: Not on file    Attends religious service: Not on file    Active member of club or organization: Not on file    Attends meetings of clubs or organizations: Not on file    Relationship status: Not on file  . Intimate partner violence:    Fear of current or ex partner: Not on file    Emotionally abused: Not on file    Physically abused: Not on file    Forced sexual activity: Not on file  Other Topics Concern  . Not on file  Social History Narrative  . Not on file    Review of Systems  Constitution: Negative for decreased appetite, malaise/fatigue, weight gain and weight loss.  Eyes: Negative for visual disturbance.  Cardiovascular: Positive for leg swelling. Negative for chest pain, claudication, dyspnea on exertion,  orthopnea, palpitations and syncope.  Respiratory: Negative for hemoptysis and wheezing.   Endocrine: Negative for cold intolerance and heat intolerance.  Hematologic/Lymphatic: Does not bruise/bleed easily.  Skin: Negative for nail changes.  Musculoskeletal: Negative for muscle weakness and myalgias.  Gastrointestinal: Negative for abdominal pain, change in bowel habit, nausea and vomiting.  Neurological: Negative for difficulty with concentration, dizziness, focal weakness and headaches.  Psychiatric/Behavioral: Negative for altered mental status and suicidal ideas.  All other systems reviewed and are negative.     Objective:  Height 6' (1.829 m), weight 235 lb (106.6 kg). Body mass index is 31.87 kg/m.   Physical exam not performed or limited due to virtual visit.   Please see exam details from prior visit is as below.     Physical Exam  Constitutional: He is oriented to person, place, and time. Vital signs are normal. He appears well-developed and well-nourished.  HENT:  Head: Normocephalic and atraumatic.  Neck: Normal range of motion.  Cardiovascular: Normal rate, regular rhythm, normal heart sounds and intact distal pulses.   Pulses:      Femoral pulses are 2+ on the right side and 2+ on the left side.      Popliteal pulses are 1+ on the right side and 1+ on the left side.       Dorsalis pedis pulses are 0 on the right side and 1+ on the left side.       Posterior tibial pulses are 2+ on the right side and 2+ on the left side.  Bilateral 1 + pitting edema Bilateral cool toes Bilateral pigmentation  Pulmonary/Chest: Effort normal and breath sounds normal. No accessory muscle usage. No respiratory distress.  Abdominal: Soft. Bowel sounds are normal.  Musculoskeletal: Normal range of motion.  Neurological: He is alert and oriented to person, place, and time.  Skin: Skin is warm and dry.  Vitals reviewed.  Radiology: No results found.  Laboratory examination:   PCP labs 05/12/2018: Cholesterol 145, triglycerides 136, HDL 46, LDL 86.  Potassium 4.8, creatinine 1.29, glucose 162, EGFR 57, CMP otherwise normal.  Iron panel normal.  Hemoglobin A1c 6.5%.  RBC 4.37, hemoglobin 13.1, hematocrit 39.1, CBC otherwise normal.  CMP Latest Ref Rng & Units 06/17/2017 07/01/2016 06/22/2016  Glucose 65 - 99 mg/dL 118(H) - 103(H)  BUN 6 - 20 mg/dL 17 14 25(H)  Creatinine 0.61 - 1.24 mg/dL 0.98 0.9 1.11  Sodium 135 - 145 mmol/L 140 141 136  Potassium 3.5 - 5.1 mmol/L 3.9 4.2 3.8  Chloride 101 - 111 mmol/L 106 - 104  CO2 22 - 32 mmol/L 23 - 24  Calcium 8.9 - 10.3 mg/dL 9.5 - 7.9(L)  Total Protein 6.5 - 8.1 g/dL - - -  Total Bilirubin 0.3 - 1.2 mg/dL - - -  Alkaline Phos 38 - 126 U/L - - -  AST 15 - 41 U/L - - -  ALT 17 - 63 U/L - - -   CBC Latest Ref Rng & Units 06/17/2017 07/01/2016 06/22/2016  WBC 4.0 - 10.5 K/uL 7.5 6.9 6.5  Hemoglobin 13.0 - 17.0 g/dL 13.3 8.0(A) 8.9(L)  Hematocrit 39.0 - 52.0 % 39.5 25(A) 27.2(L)  Platelets 150 - 400 K/uL 171 280 155   Lipid Panel     Component Value Date/Time   CHOL 97 06/22/2016 0234   TRIG 142 06/22/2016 0234   HDL 24 (L) 06/22/2016 0234   CHOLHDL 4.0 06/22/2016 0234   VLDL 28  06/22/2016 0234   LDLCALC 45 06/22/2016  0234   HEMOGLOBIN A1C Lab Results  Component Value Date   HGBA1C 12.2 (H) 06/22/2016   MPG 303 06/22/2016   TSH No results for input(s): TSH in the last 8760 hours.  PRN Meds:. Medications Discontinued During This Encounter  Medication Reason  . losartan (COZAAR) 100 MG tablet   . naproxen sodium (ALEVE) 220 MG tablet    Current Meds  Medication Sig  . allopurinol (ZYLOPRIM) 100 MG tablet Take 200 mg by mouth daily.   Marland Kitchen amLODipine (NORVASC) 10 MG tablet Take 10 mg by mouth daily.  Marland Kitchen aspirin 81 MG tablet Take 1 tablet (81 mg total) by mouth daily.  Marland Kitchen atorvastatin (LIPITOR) 40 MG tablet Take 1 tablet (40 mg total) by mouth daily at 6 PM.  . clopidogrel (PLAVIX) 75 MG tablet Take 75 mg by mouth daily.  Marland Kitchen dutasteride (AVODART) 0.5 MG capsule 1 capsule daily.  . famotidine (PEPCID) 40 MG tablet Take 40 mg by mouth 2 (two) times daily.   . insulin detemir (LEVEMIR) 100 UNIT/ML injection Inject 0.4 mLs (40 Units total) into the skin at bedtime. (Patient taking differently: Inject 38 Units into the skin at bedtime. )  . metFORMIN (GLUCOPHAGE) 500 MG tablet Take 1 tablet (500 mg total) by mouth 2 (two) times daily with a meal.  . metoprolol tartrate (LOPRESSOR) 25 MG tablet Take 1 tablet (25 mg total) by mouth 2 (two) times daily.  Marland Kitchen spironolactone (ALDACTONE) 25 MG tablet Take by mouth daily.  . valsartan-hydrochlorothiazide (DIOVAN-HCT) 320-12.5 MG tablet TAKE 1 TABLET BY MOUTH EVERY MORNING    Cardiac Studies:   Carotid artery duplex 11/10/2017: Stenosis in the right internal carotid artery (16-49%). Mild stenosis in the right common carotid artery with moderate soft plaque (<50%). Stenosis in the left internal carotid artery (<50%). Mild stenosis in the left common carotid artery with diffuse moderate soft plaque (<50%) (see attached image). Antegrade right vertebral artery flow. Antegrade left vertebral artery flow. Compared to the study  done in 01/20/08/2017, right ICA stenosis was in the 50-69% range.  Otherwise no significant change. Recheck in 1 year if clinically indicated.  ABI 08/20/2018: This exam reveals normal perfusion of the right lower extremity (ABI 1.00) AND mildly decreased perfusion of the left lower extremity, noted at the post tibial artery level (ABI 0.88).  No significant change from 11/10/2017.  ABI Nov 10, 2017: This exam reveals mildly decreased perfusion of the right lower extremity, noted at the post tibial artery level (ABI 0.90) with biphasic waveform and mildly decreased perfusion of the left lower extremity, noted at the post tibial artery level (ABI 0.90) with monophasic waveform in AT and biphasic waveform in PT. Compared to the study 08/15/2017, right ABI 0.82 and left ABI 0.73. This represents an improvement post revascularization.  Peripheral angiogram 09/23/2017: Severe Rt TP trunk, Rt PTA and Tr peroneal artery stenosis. Near total occlusion mid Rt ATA with collaterals seen distally. Three vessel below the knee run off with slow flow Successful orbital atherectomy with Diamondback 360 1.25 solid micro crown to Rt TP trunk, Rt PTA and Rt peroneal artery, followed by PTA with 3.0 X 60 mm balloon. 0% residual stenosis in Rt PTA, Rt peroneal. 10% residual stenosis in Rt TP trunk. Recommend continued medical management and PTA to Rt ATA if claudication symptoms persist.  Peripheral arteriogram 08/26/2017: Bilateral iliac vessels reveal 30-40% diffuse luminal irregularity. Left TP trunk high-grade 99% stenosis extending into the dominant EKG S/P HawkOne directional atherectomy followed I 4.0 x 80 mm  InPact Admiral DCB to 0% 2 vessel runoff.  Coronary angiogram 05/13/2016: Normal LVEF. RCA ocluded. LAD 3 tandem prox to mid aneurysmal dilatation with 90% stenosis in the mid LAD. Smooth after stenosis. D1 prox 80%. Cx Occluded mid. RI-2 prox to mid 80%.  Echocardiogram 03/30/2016: - Left ventricle: The  cavity size was normal. Wall thickness was normal. Systolic function was normal. The estimated ejection fraction was in the range of 50% to 55%. Probable severe hypokinesis and scarring of the basalinferolateral and inferior myocardium; in the distribution of the right coronary or left circumflex coronary artery. Features are consistent with a pseudonormal left ventricular filling pattern, with concomitant abnormal relaxation and increased filling pressure (grade 2 diastolic dysfunction). - Mitral valve: There was mild to moderate regurgitation. - Left atrium: The atrium was mildly dilated.  Assessment:   PAD (peripheral artery disease) ([facility])  S/P CABG x 4  History of CVA with residual deficit  Atherosclerosis of native coronary artery of native heart without angina pectoris  Bilateral carotid artery stenosis  Controlled type 2 diabetes mellitus with diabetic polyneuropathy, with long-term current use of insulin ([facility])  EKG 02/23/2018: Normal sinus rhythm at rate of 70 bpm, inferior infarct old, anteroseptal infarct old. Nonspecific T abnormality. Compared to EKG 08/05/18, not change.  Recommendations:   I discussed recently obtained ABI results with the patient, no significant change compared to ABI in July 2019.  Has stable mild symptoms of claudication, will continue to monitor.  Would recommend repeating ABI in 6 months, or sooner if worsening symptoms.  Encouraged him to continue with walking regularly to help with his circulation.  He continues to be on dual antiplatelet therapy, will consider discontinuing in the next few months as he will be 1 year from PV angiogram and transition him to low-dose Xarelto in view of his multiple risk factors.   No symptoms of angina.  He is on appropriate medical therapy.  I reviewed labs from PCP office in February, stable.  He is on Lipitor 40 mg daily, LDL is not quite at goal of less than 70.  Could consider addition of Zetia or  PCSK9 inhibitor.  We will further discuss at his next office visit.  Diabetes appears to be well controlled and is being managed by PCP.  He did not have a way to check his blood pressure today, but was recently checked at the dentist office and he states was normal.  Will reevaluate at his office visit.  No changes were made to medications today.  He has bilateral carotid stenosis, will repeat carotid duplex in July and would like to see him back after this to discuss results and to evaluate vascular exam.  All questions answered.  Miquel Dunn, MSN, APRN, FNP-C [facility]. [place] Office: [phone] Fax: [phone]

## 2018-09-04 ENCOUNTER — Encounter (HOSPITAL_COMMUNITY): Payer: Self-pay | Admitting: Occupational Therapy

## 2018-09-04 NOTE — Therapy (Signed)
[place], [place], [address] Phone: [phone]   Fax:  [phone]  Patient Details  Name: Drew Harrison MRN: [id number] Date of Birth: [birth year] Referring Provider:  No ref. provider found  Encounter Date: 09/04/2018  OCCUPATIONAL THERAPY DISCHARGE SUMMARY  Visits from Start of Care: 3  Current functional level related to goals / functional outcomes: Unknown. Pt has not returned call to schedule and come back into clinic since closure for covid precautions. Pt will be discharged at this time.    Remaining deficits: Unknown   Education / Equipment: HEP  Plan: Patient agrees to discharge.  Patient goals were not met. Patient is being discharged due to not returning since the last visit.  ?????       Guadelupe Sabin, OTR/L  [phone] 09/04/2018, 11:01 AM  [place], [place], [address] Phone: [phone]   Fax:  [phone]

## 2018-09-30 ENCOUNTER — Telehealth: Payer: Self-pay

## 2018-09-30 NOTE — Telephone Encounter (Signed)
Olmesartan HCT 40-12.5 mg daily

## 2018-09-30 NOTE — Telephone Encounter (Signed)
Pharmacy had Valsartan/hctz, so never mind.

## 2018-09-30 NOTE — Telephone Encounter (Signed)
Pt called and said his valsartan/hctz is on back order until the end of the month. Is there an alternative?

## 2018-10-16 ENCOUNTER — Other Ambulatory Visit: Payer: Self-pay

## 2018-10-16 ENCOUNTER — Ambulatory Visit (INDEPENDENT_AMBULATORY_CARE_PROVIDER_SITE_OTHER): Payer: PPO

## 2018-10-16 DIAGNOSIS — I6523 Occlusion and stenosis of bilateral carotid arteries: Secondary | ICD-10-CM | POA: Diagnosis not present

## 2018-10-18 ENCOUNTER — Other Ambulatory Visit: Payer: Self-pay | Admitting: Cardiology

## 2018-10-18 DIAGNOSIS — I6523 Occlusion and stenosis of bilateral carotid arteries: Secondary | ICD-10-CM

## 2018-10-27 ENCOUNTER — Other Ambulatory Visit: Payer: Self-pay

## 2018-10-27 ENCOUNTER — Encounter: Payer: Self-pay | Admitting: Cardiology

## 2018-10-27 ENCOUNTER — Ambulatory Visit (INDEPENDENT_AMBULATORY_CARE_PROVIDER_SITE_OTHER): Payer: PPO | Admitting: Cardiology

## 2018-10-27 VITALS — BP 134/70 | HR 73 | Temp 98.4°F | Ht 72.0 in | Wt 226.0 lb

## 2018-10-27 DIAGNOSIS — R0609 Other forms of dyspnea: Secondary | ICD-10-CM | POA: Diagnosis not present

## 2018-10-27 DIAGNOSIS — I6523 Occlusion and stenosis of bilateral carotid arteries: Secondary | ICD-10-CM

## 2018-10-27 DIAGNOSIS — Z951 Presence of aortocoronary bypass graft: Secondary | ICD-10-CM

## 2018-10-27 DIAGNOSIS — I739 Peripheral vascular disease, unspecified: Secondary | ICD-10-CM

## 2018-10-27 DIAGNOSIS — R0600 Dyspnea, unspecified: Secondary | ICD-10-CM

## 2018-10-27 DIAGNOSIS — E785 Hyperlipidemia, unspecified: Secondary | ICD-10-CM | POA: Diagnosis not present

## 2018-10-27 DIAGNOSIS — I2510 Atherosclerotic heart disease of native coronary artery without angina pectoris: Secondary | ICD-10-CM | POA: Diagnosis not present

## 2018-10-27 MED ORDER — ATORVASTATIN CALCIUM 80 MG PO TABS: 80.0000 mg | ORAL_TABLET | Freq: Every day | ORAL | 3 refills | Status: DC

## 2018-10-27 NOTE — Progress Notes (Signed)
Primary Physician:  Nickola Major, MD   Patient ID: [name], male    DOB: March 03, 1951, 68 y.o.   MRN: [id number]  Subjective:    Chief Complaint  Patient presents with   PAD    2 month f/u   Carotid Stenosis    HPI: Drew Harrison  is a 68 y.o. male  with  coronary artery disease s/p CABGX4 (05/2016), post op CVA with no significant neurodeficit, hypertension, type 2 DM, bilateral mod carotid stenosis, severe LE PAD.  He has had left popliteal and distal to be trunk angioplasty on 08/26/2017 and also right DP trunk and PTCA and peroneal artery angioplasty on 09/23/2017.  ABI performed in May 2020 was unchanged.  He now presents discuss carotid duplex results.  Symptoms of claudication have remained stable.  Has mild symptoms in his.  He does admit to not being very active over the last few months, but has recently tried 4 days a week.  He is tolerating medications well.  No chest pain.  Does have dyspnea on exertion with walking, but states that this is unchanged.  He reports he is to see PCP the end of this month for lab work and follow-up.  Past Medical History:  Diagnosis Date   Abnormal nuclear stress test    Arthritis    Cellulitis and abscess of hand 05/2017   Coronary artery disease    3-vessel   Diabetes mellitus without complication (HCC)    GERD (gastroesophageal reflux disease)    Gout    Headache    History of tobacco abuse    Hyperlipidemia    Hypertension    Ischemic stroke ([place])    Stroke ([place]) 03/29/2016   left facial droop   Stroke due to embolism ([place])    Visual disturbance     Past Surgical History:  Procedure Laterality Date   CORONARY ARTERY BYPASS GRAFT N/A 06/17/2016   Procedure: CORONARY ARTERY BYPASS GRAFTING (CABG) x four using left internal mammary artery and right greater saphenous leg vein using endoscope.;  Surgeon: [name], MD;  Location: [facility];  Service: Open Heart Surgery;  Laterality: N/A;   LEFT  HEART CATH AND CORONARY ANGIOGRAPHY N/A 05/14/2016   Procedure: Left Heart Cath and Coronary Angiography;  Surgeon: Adrian Prows, MD;  Location: [facility];  Service: Cardiovascular;  Laterality: N/A;   LOWER EXTREMITY ANGIOGRAPHY N/A 01/21/2017   Procedure: Lower Extremity Angiography;  Surgeon: Adrian Prows, MD;  Location: [place] [facility];  Service: Cardiovascular;  Laterality: N/A;   LOWER EXTREMITY ANGIOGRAPHY Left 08/26/2017   Procedure: LOWER EXTREMITY ANGIOGRAPHY;  Surgeon: Adrian Prows, MD;  Location: [place] [facility];  Service: Cardiovascular;  Laterality: Left;   LOWER EXTREMITY ANGIOGRAPHY Right 09/23/2017   Procedure: LOWER EXTREMITY ANGIOGRAPHY;  Surgeon: Nigel Mormon, MD;  Location: [place] [facility];  Service: Cardiovascular;  Laterality: Right;   PERIPHERAL VASCULAR ATHERECTOMY  08/26/2017   Procedure: PERIPHERAL VASCULAR ATHERECTOMY;  Surgeon: Adrian Prows, MD;  Location: [place] [facility];  Service: Cardiovascular;;   PERIPHERAL VASCULAR ATHERECTOMY  09/23/2017   Procedure: PERIPHERAL VASCULAR ATHERECTOMY;  Surgeon: Nigel Mormon, MD;  Location: [place] [facility];  Service: Cardiovascular;;   PERIPHERAL VASCULAR BALLOON ANGIOPLASTY  08/26/2017   Procedure: PERIPHERAL VASCULAR BALLOON ANGIOPLASTY;  Surgeon: Adrian Prows, MD;  Location: [place] [facility];  Service: Cardiovascular;;   PERIPHERAL VASCULAR BALLOON ANGIOPLASTY  09/23/2017   Procedure: PERIPHERAL VASCULAR BALLOON ANGIOPLASTY;  Surgeon: Nigel Mormon, MD;  Location: [place] [facility];  Service: Cardiovascular;;   TEE WITHOUT CARDIOVERSION N/A 06/17/2016   Procedure: TRANSESOPHAGEAL ECHOCARDIOGRAM (TEE);  Surgeon: [name], MD;  Location: [facility];  Service: Open Heart Surgery;  Laterality: N/A;   TONSILLECTOMY      Social History   Socioeconomic History   Marital status: Divorced    Spouse name: Not on file   Number of children: 1   Years of education: Not on file   Highest  education level: Not on file  Occupational History   Occupation: Financial trader: [employer] resource strain: Not on file   Food insecurity    Worry: Not on file    Inability: Not on file   Transportation needs    Medical: Not on file    Non-medical: Not on file  Tobacco Use   Smoking status: Former Smoker    Packs/day: 1.50    Years: 48.00    Pack years: 72.00    Types: Cigarettes    Quit date: 03/29/2016    Years since quitting: 2.5   Smokeless tobacco: Never Used  Substance and Sexual Activity   Alcohol use: Yes    Alcohol/week: 14.0 standard drinks    Types: 14 Cans of beer per week   Drug use: Yes    Types: Cocaine    Comment: last usage 12 yrs.   Sexual activity: Not on file  Lifestyle   Physical activity    Days per week: Not on file    Minutes per session: Not on file   Stress: Not on file  Relationships   Social connections    Talks on phone: Not on file    Gets together: Not on file    Attends religious service: Not on file    Active member of club or organization: Not on file    Attends meetings of clubs or organizations: Not on file    Relationship status: Not on file   Intimate partner violence    Fear of current or ex partner: Not on file    Emotionally abused: Not on file    Physically abused: Not on file    Forced sexual activity: Not on file  Other Topics Concern   Not on file  Social History Narrative   Not on file    Review of Systems  Constitution: Negative for decreased appetite, malaise/fatigue, weight gain and weight loss.  Eyes: Negative for visual disturbance.  Cardiovascular: Positive for leg swelling. Negative for chest pain, claudication, dyspnea on exertion, orthopnea, palpitations and syncope.  Respiratory: Negative for hemoptysis and wheezing.   Endocrine: Negative for cold intolerance and heat intolerance.  Hematologic/Lymphatic: Does not bruise/bleed easily.  Skin: Negative for nail  changes.  Musculoskeletal: Negative for muscle weakness and myalgias.  Gastrointestinal: Negative for abdominal pain, change in bowel habit, nausea and vomiting.  Neurological: Negative for difficulty with concentration, dizziness, focal weakness and headaches.  Psychiatric/Behavioral: Negative for altered mental status and suicidal ideas.  All other systems reviewed and are negative.     Objective:  Blood pressure 134/70, pulse 73, temperature 98.4 F (36.9 C), height 6' (1.829 m), weight 226 lb (102.5 kg), SpO2 99 %. Body mass index is 30.65 kg/m.   Physical exam not performed or limited due to virtual visit.   Please see exam details from prior visit is as below.     Physical Exam  Constitutional: He  is oriented to person, place, and time. Vital signs are normal. He appears well-developed and well-nourished.  HENT:  Head: Normocephalic and atraumatic.  Neck: Normal range of motion.  Cardiovascular: Normal rate, regular rhythm, normal heart sounds and intact distal pulses.  Pulses:      Femoral pulses are 2+ on the right side and 2+ on the left side.      Popliteal pulses are 1+ on the right side and 1+ on the left side.       Dorsalis pedis pulses are 0 on the right side and 1+ on the left side.       Posterior tibial pulses are 2+ on the right side and 2+ on the left side.  Bilateral 1 + pitting edema Bilateral cool toes Bilateral pigmentation  Pulmonary/Chest: Effort normal and breath sounds normal. No accessory muscle usage. No respiratory distress.  Abdominal: Soft. Bowel sounds are normal.  Musculoskeletal: Normal range of motion.  Neurological: He is alert and oriented to person, place, and time.  Skin: Skin is warm and dry.  Vitals reviewed.  Radiology: No results found.  Laboratory examination:   PCP labs 05/12/2018: Cholesterol 145, triglycerides 136, HDL 46, LDL 86.  Potassium 4.8, creatinine 1.29, glucose 162, EGFR 57, CMP otherwise normal.  Iron panel normal.   Hemoglobin A1c 6.5%.  RBC 4.37, hemoglobin 13.1, hematocrit 39.1, CBC otherwise normal.  CMP Latest Ref Rng & Units 06/17/2017 07/01/2016 06/22/2016  Glucose 65 - 99 mg/dL 118(H) - 103(H)  BUN 6 - 20 mg/dL 17 14 25(H)  Creatinine 0.61 - 1.24 mg/dL 0.98 0.9 1.11  Sodium 135 - 145 mmol/L 140 141 136  Potassium 3.5 - 5.1 mmol/L 3.9 4.2 3.8  Chloride 101 - 111 mmol/L 106 - 104  CO2 22 - 32 mmol/L 23 - 24  Calcium 8.9 - 10.3 mg/dL 9.5 - 7.9(L)  Total Protein 6.5 - 8.1 g/dL - - -  Total Bilirubin 0.3 - 1.2 mg/dL - - -  Alkaline Phos 38 - 126 U/L - - -  AST 15 - 41 U/L - - -  ALT 17 - 63 U/L - - -   CBC Latest Ref Rng & Units 06/17/2017 07/01/2016 06/22/2016  WBC 4.0 - 10.5 K/uL 7.5 6.9 6.5  Hemoglobin 13.0 - 17.0 g/dL 13.3 8.0(A) 8.9(L)  Hematocrit 39.0 - 52.0 % 39.5 25(A) 27.2(L)  Platelets 150 - 400 K/uL 171 280 155   Lipid Panel     Component Value Date/Time   CHOL 97 06/22/2016 0234   TRIG 142 06/22/2016 0234   HDL 24 (L) 06/22/2016 0234   CHOLHDL 4.0 06/22/2016 0234   VLDL 28 06/22/2016 0234   LDLCALC 45 06/22/2016 0234   HEMOGLOBIN A1C Lab Results  Component Value Date   HGBA1C 12.2 (H) 06/22/2016   MPG 303 06/22/2016   TSH No results for input(s): TSH in the last 8760 hours.  PRN Meds:. Medications Discontinued During This Encounter  Medication Reason   atorvastatin (LIPITOR) 40 MG tablet Discontinued by provider   Current Meds  Medication Sig   allopurinol (ZYLOPRIM) 100 MG tablet Take 200 mg by mouth daily.    amLODipine (NORVASC) 10 MG tablet Take 10 mg by mouth daily.   aspirin 81 MG tablet Take 1 tablet (81 mg total) by mouth daily.   clopidogrel (PLAVIX) 75 MG tablet Take 75 mg by mouth daily.   dutasteride (AVODART) 0.5 MG capsule 1 capsule daily.   famotidine (PEPCID) 40 MG tablet Take 40 mg by  mouth 2 (two) times daily.    insulin detemir (LEVEMIR) 100 UNIT/ML injection Inject 0.4 mLs (40 Units total) into the skin at bedtime. (Patient taking  differently: Inject 38 Units into the skin at bedtime. )   metFORMIN (GLUCOPHAGE) 500 MG tablet Take 1 tablet (500 mg total) by mouth 2 (two) times daily with a meal.   metoprolol tartrate (LOPRESSOR) 25 MG tablet Take 1 tablet (25 mg total) by mouth 2 (two) times daily.   spironolactone (ALDACTONE) 25 MG tablet Take 1 tablet (25 mg total) by mouth daily.   valsartan-hydrochlorothiazide (DIOVAN-HCT) 320-12.5 MG tablet Take 1 tablet by mouth daily.   [DISCONTINUED] atorvastatin (LIPITOR) 40 MG tablet Take 1 tablet (40 mg total) by mouth daily at 6 PM.    Cardiac Studies:   Carotid artery duplex  10/16/2018: Stenosis in the right internal carotid artery (50-69%).  Stenosis in the right external carotid artery (<50%). Stenosis in the left internal carotid artery (16-49%). Stenosis in the left common carotid artery (<50%). Right vertebral artery waveform shows high resistance, may suggest distal disease.  Antegrade right vertebral artery flow. Antegrade left vertebral artery flow. Compared to 10/15/2017, no significant change noted. Minimal progression in the left ICA stenosis. Follow up in six months is appropriate if clinically indicated.  ABI 08/20/2018: This exam reveals normal perfusion of the right lower extremity (ABI 1.00) AND mildly decreased perfusion of the left lower extremity, noted at the post tibial artery level (ABI 0.88).  No significant change from 10/15/2017.  ABI 10/15/2017: This exam reveals mildly decreased perfusion of the right lower extremity, noted at the post tibial artery level (ABI 0.90) with biphasic waveform and mildly decreased perfusion of the left lower extremity, noted at the post tibial artery level (ABI 0.90) with monophasic waveform in AT and biphasic waveform in PT. Compared to the study 08/15/2017, right ABI 0.82 and left ABI 0.73. This represents an improvement post revascularization.  Peripheral angiogram 09/23/2017: Severe Rt TP trunk, Rt PTA and  Tr peroneal artery stenosis. Near total occlusion mid Rt ATA with collaterals seen distally. Three vessel below the knee run off with slow flow Successful orbital atherectomy with Diamondback 360 1.25 solid micro crown to Rt TP trunk, Rt PTA and Rt peroneal artery, followed by PTA with 3.0 X 60 mm balloon. 0% residual stenosis in Rt PTA, Rt peroneal. 10% residual stenosis in Rt TP trunk. Recommend continued medical management and PTA to Rt ATA if claudication symptoms persist.  Peripheral arteriogram 08/26/2017: Bilateral iliac vessels reveal 30-40% diffuse luminal irregularity. Left TP trunk high-grade 99% stenosis extending into the dominant EKG S/P HawkOne directional atherectomy followed I 4.0 x 80 mm InPact Admiral DCB to 0% 2 vessel runoff.  Coronary angiogram 05/13/2016: Normal LVEF. RCA ocluded. LAD 3 tandem prox to mid aneurysmal dilatation with 90% stenosis in the mid LAD. Smooth after stenosis. D1 prox 80%. Cx Occluded mid. RI-2 prox to mid 80%.  Echocardiogram 03/30/2016: - Left ventricle: The cavity size was normal. Wall thickness was normal. Systolic function was normal. The estimated ejection fraction was in the range of 50% to 55%. Probable severe hypokinesis and scarring of the basalinferolateral and inferior myocardium; in the distribution of the right coronary or left circumflex coronary artery. Features are consistent with a pseudonormal left ventricular filling pattern, with concomitant abnormal relaxation and increased filling pressure (grade 2 diastolic dysfunction). - Mitral valve: There was mild to moderate regurgitation. - Left atrium: The atrium was mildly dilated.  Assessment:  ICD-10-CM   1. Bilateral carotid artery stenosis  I65.23   2. Atherosclerosis of native coronary artery of native heart without angina pectoris  I25.10   3. S/P CABG x 4  Z95.1 EKG 12-Lead  4. PAD (peripheral artery disease) (HCC)  I73.9   5. Hyperlipidemia LDL goal <70  E78.5  Lipid Profile    Lipid Profile  6. Dyspnea on exertion  R06.09     EKG 10/27/2018: Normal sinus rhythm at 72 bpm, 1 PVC, inferior infarct old, anteroseptal infarct old. T wave inversion in inferolateral leads, cannot exclude ischemia.  Recommendations:   I discussed recently obtained carotid duplex results, has had slight reduction in left ICA.  Will repeat carotid duplex in 6 months for close observation.  He is currently on Plavix and aspirin therapy and will continue the same.  He may be a good candidate for Xarelto therapy in the near future, patient also with this change at this time, but may consider at his next office visit.  Symptoms of claudication have remained stable.  Vascular exam is unchanged.  Recent ABI in May was unchanged.  Continue with aggressive medical therapy.    I reviewed lipids from PCP office in February, LDL is not at goal of less than 70.  I will increase his Lipitor to 80 mg daily.  He reports he needs to have labs performed the end of this month, will ask that we will continue to be deferred 2 to 3 months for reevaluation after being on high dose of Lipitor.  Will repeat before next office visit. He may require addition of Zetia.  No significant changes are noted to EKG.  Blood pressure remains well controlled.  He does have occasional PVCs on physical exam and EKG today. Asymptomatic. He does have shortness of breath with exertion, likely related to inactivity. No chest pain. I have encouraged him to be more active and to notify me if shortness of breath does not improve with increasing his activity.    I have encouraged him to work on weight loss that may also help with his dyspnea. Encouraged him to make diet modifications changes to help with this.  Will see him back in 3 months for monitoring.    Miquel Dunn, MSN, APRN, FNP-C [facility]. [place] Office: [phone] Fax: [phone]

## 2018-10-28 ENCOUNTER — Encounter: Payer: Self-pay | Admitting: Cardiology

## 2018-11-12 DIAGNOSIS — K219 Gastro-esophageal reflux disease without esophagitis: Secondary | ICD-10-CM | POA: Diagnosis not present

## 2018-11-12 DIAGNOSIS — I639 Cerebral infarction, unspecified: Secondary | ICD-10-CM | POA: Diagnosis not present

## 2018-11-12 DIAGNOSIS — E782 Mixed hyperlipidemia: Secondary | ICD-10-CM | POA: Diagnosis not present

## 2018-11-12 DIAGNOSIS — M1A9XX0 Chronic gout, unspecified, without tophus (tophi): Secondary | ICD-10-CM | POA: Diagnosis not present

## 2018-11-12 DIAGNOSIS — Z5181 Encounter for therapeutic drug level monitoring: Secondary | ICD-10-CM | POA: Diagnosis not present

## 2018-11-12 DIAGNOSIS — I10 Essential (primary) hypertension: Secondary | ICD-10-CM | POA: Diagnosis not present

## 2018-11-12 DIAGNOSIS — E119 Type 2 diabetes mellitus without complications: Secondary | ICD-10-CM | POA: Diagnosis not present

## 2018-11-16 DIAGNOSIS — E782 Mixed hyperlipidemia: Secondary | ICD-10-CM | POA: Diagnosis not present

## 2018-11-16 DIAGNOSIS — I739 Peripheral vascular disease, unspecified: Secondary | ICD-10-CM | POA: Diagnosis not present

## 2018-11-16 DIAGNOSIS — Z23 Encounter for immunization: Secondary | ICD-10-CM | POA: Diagnosis not present

## 2018-11-16 DIAGNOSIS — N401 Enlarged prostate with lower urinary tract symptoms: Secondary | ICD-10-CM | POA: Diagnosis not present

## 2018-11-16 DIAGNOSIS — R351 Nocturia: Secondary | ICD-10-CM | POA: Diagnosis not present

## 2018-11-16 DIAGNOSIS — E119 Type 2 diabetes mellitus without complications: Secondary | ICD-10-CM | POA: Diagnosis not present

## 2018-11-16 DIAGNOSIS — I639 Cerebral infarction, unspecified: Secondary | ICD-10-CM | POA: Diagnosis not present

## 2018-11-16 DIAGNOSIS — I700 Atherosclerosis of aorta: Secondary | ICD-10-CM | POA: Diagnosis not present

## 2018-11-16 DIAGNOSIS — I10 Essential (primary) hypertension: Secondary | ICD-10-CM | POA: Diagnosis not present

## 2018-11-16 DIAGNOSIS — K219 Gastro-esophageal reflux disease without esophagitis: Secondary | ICD-10-CM | POA: Diagnosis not present

## 2018-11-16 DIAGNOSIS — M1A9XX0 Chronic gout, unspecified, without tophus (tophi): Secondary | ICD-10-CM | POA: Diagnosis not present

## 2018-11-16 DIAGNOSIS — Z0000 Encounter for general adult medical examination without abnormal findings: Secondary | ICD-10-CM | POA: Diagnosis not present

## 2018-11-16 DIAGNOSIS — I2510 Atherosclerotic heart disease of native coronary artery without angina pectoris: Secondary | ICD-10-CM | POA: Diagnosis not present

## 2018-11-26 ENCOUNTER — Other Ambulatory Visit: Payer: Self-pay | Admitting: Cardiology

## 2019-01-12 DIAGNOSIS — Z23 Encounter for immunization: Secondary | ICD-10-CM | POA: Diagnosis not present

## 2019-01-18 DIAGNOSIS — E785 Hyperlipidemia, unspecified: Secondary | ICD-10-CM | POA: Diagnosis not present

## 2019-01-19 LAB — LIPID PANEL
Chol/HDL Ratio: 4.1 ratio (ref 0.0–5.0)
Cholesterol, Total: 126 mg/dL (ref 100–199)
HDL: 31 mg/dL — ABNORMAL LOW (ref >39)
LDL Chol Calc (NIH): 60 mg/dL (ref 0–99)
Triglycerides: 211 mg/dL — ABNORMAL HIGH (ref 0–149)
VLDL Cholesterol Cal: 35 mg/dL (ref 5–40)

## 2019-01-19 NOTE — Progress Notes (Signed)
LDL is at goal. Triglycerides are markedly elevated. Likely related to diet. Will discuss at upcoming appt

## 2019-01-28 ENCOUNTER — Other Ambulatory Visit: Payer: Self-pay

## 2019-01-28 ENCOUNTER — Encounter: Payer: Self-pay | Admitting: Cardiology

## 2019-01-28 ENCOUNTER — Ambulatory Visit (INDEPENDENT_AMBULATORY_CARE_PROVIDER_SITE_OTHER): Payer: PPO | Admitting: Cardiology

## 2019-01-28 VITALS — BP 133/66 | HR 86 | Temp 97.0°F | Ht 72.0 in | Wt 227.0 lb

## 2019-01-28 DIAGNOSIS — I6523 Occlusion and stenosis of bilateral carotid arteries: Secondary | ICD-10-CM

## 2019-01-28 DIAGNOSIS — I739 Peripheral vascular disease, unspecified: Secondary | ICD-10-CM | POA: Diagnosis not present

## 2019-01-28 DIAGNOSIS — E785 Hyperlipidemia, unspecified: Secondary | ICD-10-CM | POA: Diagnosis not present

## 2019-01-28 DIAGNOSIS — I2510 Atherosclerotic heart disease of native coronary artery without angina pectoris: Secondary | ICD-10-CM | POA: Diagnosis not present

## 2019-01-28 MED ORDER — EZETIMIBE 10 MG PO TABS: 10.0000 mg | ORAL_TABLET | Freq: Every day | ORAL | 2 refills | Status: DC

## 2019-01-28 NOTE — Progress Notes (Signed)
Primary Physician:  Nickola Major, MD   Patient ID: Drew Harrison, male    DOB: Sep 07, 1950, 68 y.o.   MRN: [id number]  Subjective:    Chief Complaint  Patient presents with  . Weight Gain  . PAD  . Hypertension  . Follow-up    3 month, lab results    HPI: Drew Harrison  is a 68 y.o. male  with  coronary artery disease s/p CABGX4 (05/2016), post op CVA with no significant neurodeficit, hypertension, type 2 DM with vascular complications, bilateral mod carotid stenosis, severe LE PAD.  He has had left popliteal and distal to be trunk angioplasty on 08/26/2017 and also right TP trunk and right  peroneal artery angioplasty on 09/23/2017.  ABI performed in May 2020 was unchanged. Symptoms of claudication have remained stable. He is tolerating medications well.  No chest pain.  Does have dyspnea on exertion with walking, but states that this is unchanged.  He denies recent use of S/L NTG, no bleeding diathesis on DAPT.   Past Medical History:  Diagnosis Date  . Abnormal nuclear stress test   . Arthritis   . Cellulitis and abscess of hand 05/2017  . Coronary artery disease    3-vessel  . Diabetes mellitus without complication ([place])   . GERD (gastroesophageal reflux disease)   . Gout   . Headache   . History of tobacco abuse   . Hyperlipidemia   . Hypertension   . Ischemic stroke ([place])   . Stroke ([place]) 03/29/2016   left facial droop  . Stroke due to embolism ([place])   . Visual disturbance     Past Surgical History:  Procedure Laterality Date  . CORONARY ARTERY BYPASS GRAFT N/A 06/17/2016   Procedure: CORONARY ARTERY BYPASS GRAFTING (CABG) x four using left internal mammary artery and right greater saphenous leg vein using endoscope.;  Surgeon: [name], MD;  Location: [facility];  Service: Open Heart Surgery;  Laterality: N/A;  . LEFT HEART CATH AND CORONARY ANGIOGRAPHY N/A 05/14/2016   Procedure: Left Heart Cath and Coronary Angiography;  Surgeon: Adrian Prows, MD;   Location: [facility];  Service: Cardiovascular;  Laterality: N/A;  . LOWER EXTREMITY ANGIOGRAPHY N/A 01/21/2017   Procedure: Lower Extremity Angiography;  Surgeon: Adrian Prows, MD;  Location: [facility];  Service: Cardiovascular;  Laterality: N/A;  . LOWER EXTREMITY ANGIOGRAPHY Left 08/26/2017   Procedure: LOWER EXTREMITY ANGIOGRAPHY;  Surgeon: Adrian Prows, MD;  Location: [facility];  Service: Cardiovascular;  Laterality: Left;  . LOWER EXTREMITY ANGIOGRAPHY Right 09/23/2017   Procedure: LOWER EXTREMITY ANGIOGRAPHY;  Surgeon: Nigel Mormon, MD;  Location: [facility];  Service: Cardiovascular;  Laterality: Right;  . PERIPHERAL VASCULAR ATHERECTOMY  08/26/2017   Procedure: PERIPHERAL VASCULAR ATHERECTOMY;  Surgeon: Adrian Prows, MD;  Location: [facility];  Service: Cardiovascular;;  . PERIPHERAL VASCULAR ATHERECTOMY  09/23/2017   Procedure: PERIPHERAL VASCULAR ATHERECTOMY;  Surgeon: Nigel Mormon, MD;  Location: [facility];  Service: Cardiovascular;;  . PERIPHERAL VASCULAR BALLOON ANGIOPLASTY  08/26/2017   Procedure: PERIPHERAL VASCULAR BALLOON ANGIOPLASTY;  Surgeon: Adrian Prows, MD;  Location: [facility];  Service: Cardiovascular;;  . PERIPHERAL VASCULAR BALLOON ANGIOPLASTY  09/23/2017   Procedure: PERIPHERAL VASCULAR BALLOON ANGIOPLASTY;  Surgeon: Nigel Mormon, MD;  Location: [facility];  Service: Cardiovascular;;  . TEE WITHOUT CARDIOVERSION N/A 06/17/2016   Procedure: TRANSESOPHAGEAL ECHOCARDIOGRAM (TEE);  Surgeon: Tharon Aquas Trigt,  MD;  Location: [facility];  Service: Open Heart Surgery;  Laterality: N/A;  . TONSILLECTOMY      Social History   Socioeconomic History  . Marital status: Divorced    Spouse name: Not on file  . Number of children: 1  . Years of education: Not on file  . Highest education level: Not on file  Occupational History  . Occupation: Financial trader: [employer]  Social Needs  . Financial resource  strain: Not on file  . Food insecurity    Worry: Not on file    Inability: Not on file  . Transportation needs    Medical: Not on file    Non-medical: Not on file  Tobacco Use  . Smoking status: Former Smoker    Packs/day: 1.50    Years: 48.00    Pack years: 72.00    Types: Cigarettes    Quit date: 03/29/2016    Years since quitting: 2.8  . Smokeless tobacco: Never Used  Substance and Sexual Activity  . Alcohol use: Yes    Alcohol/week: 14.0 standard drinks    Types: 14 Cans of beer per week  . Drug use: Yes    Types: Cocaine    Comment: last usage 12 yrs.  . Sexual activity: Not on file  Lifestyle  . Physical activity    Days per week: Not on file    Minutes per session: Not on file  . Stress: Not on file  Relationships  . Social Herbalist on phone: Not on file    Gets together: Not on file    Attends religious service: Not on file    Active member of club or organization: Not on file    Attends meetings of clubs or organizations: Not on file    Relationship status: Not on file  . Intimate partner violence    Fear of current or ex partner: Not on file    Emotionally abused: Not on file    Physically abused: Not on file    Forced sexual activity: Not on file  Other Topics Concern  . Not on file  Social History Narrative  . Not on file    Review of Systems  Constitution: Negative for decreased appetite, malaise/fatigue, weight gain and weight loss.  Eyes: Negative for visual disturbance.  Cardiovascular: Positive for leg swelling (mild). Negative for chest pain, claudication, dyspnea on exertion, orthopnea, palpitations and syncope.  Respiratory: Negative for hemoptysis and wheezing.   Endocrine: Negative for cold intolerance and heat intolerance.  Hematologic/Lymphatic: Does not bruise/bleed easily.  Skin: Negative for nail changes.  Musculoskeletal: Negative for muscle weakness and myalgias.  Gastrointestinal: Negative for abdominal pain, change in  bowel habit, nausea and vomiting.  Neurological: Negative for difficulty with concentration, dizziness, focal weakness and headaches.  Psychiatric/Behavioral: Negative for altered mental status and suicidal ideas.  All other systems reviewed and are negative.  Objective:   Vitals with BMI 01/28/2019 10/27/2018 10/27/2018  Height 6' 0"  - 6' 0"   Weight 227 lbs - 226 lbs  BMI 76.73 - 41.93  Systolic 790 240 973  Diastolic 66 70 76  Pulse 86 73 -      Physical Exam  Constitutional: He is oriented to person, place, and time. Vital signs are normal. He appears well-developed and well-nourished.  HENT:  Head: Normocephalic and atraumatic.  Neck: Normal range of motion.  Cardiovascular: Normal rate, regular rhythm, normal heart sounds and intact distal pulses.  Pulses:  Femoral pulses are 2+ on the right side and 2+ on the left side.      Popliteal pulses are 1+ on the right side and 1+ on the left side.       Dorsalis pedis pulses are 1+ on the right side and 0 on the left side.       Posterior tibial pulses are 2+ on the right side and 1+ on the left side.  Bilateral 1 + pitting edema Bilateral cool toes Bilateral pigmentation  Pulmonary/Chest: Effort normal and breath sounds normal. No accessory muscle usage. No respiratory distress.  Abdominal: Soft. Bowel sounds are normal.  Musculoskeletal: Normal range of motion.  Neurological: He is alert and oriented to person, place, and time.  Skin: Skin is warm and dry.   Radiology: No results found.  Laboratory examination:   PCP labs 05/12/2018: Cholesterol 145, triglycerides 136, HDL 46, LDL 86.  Potassium 4.8, creatinine 1.29, glucose 162, EGFR 57, CMP otherwise normal.  Iron panel normal.  Hemoglobin A1c 6.5%.  RBC 4.37, hemoglobin 13.1, hematocrit 39.1, CBC otherwise normal. Comprehensive Metabolic Panel (16/12/9602 9:03 AM EDT)  Comprehensive Metabolic Panel (54/11/8117 9:03 AM EDT)  Component Value Ref Range Performed At  Pathologist Signature  Sodium 137 135 - 146 MMOL/L [facility]   Potassium 4.6 3.5 - 5.3 MMOL/L [facility]   Chloride 102 98 - 110 MMOL/L [facility]   CO2 27 23 - 30 MMOL/L [facility]   BUN 21 8 - 24 MG/DL [facility]   Glucose 133 (H) 70 - 99 MG/DL [facility]   Creatinine 1.19 0.50 - 1.50 MG/DL [facility]   Calcium 9.3 8.5 - 10.5 MG/DL [facility]   Total Protein 6.8 6.0 - 8.3 G/DL [facility]   Albumin  4.2 3.5 - 5.0 G/DL [facility]   Total Bilirubin 0.7 0.1 - 1.2 MG/DL [facility]   Alkaline Phosphatase 113 25 - 125 IU/L or U/L [facility]   AST (SGOT) 18 5 - 40 IU/L or U/L [facility]   ALT (SGPT) 26 5 - 50 IU/L or U/L [facility]   Anion Gap 8 4 - 14 MMOL/L [facility]   Est. GFR Non-African American 62Comment: GFR estimated by CKD-EPI equations, reportable up to 90 ML/MIN/1.73 M*2       Hemoglobin A1CResulted: 11/12/2018 2:53 PM [facility] Component Name Value Ref Range  HEMOGLOBIN A1C 7.1 (H)  Comment: Normal:       Less than 5.7% Prediabetes:  5.7% to 6.4% Diabetes:     Greater than 6.4% <5.7 %  eAG 157 MG/DL   Lipid ProfileResulted: 11/12/2018 2:33 PM [facility] Component Name Value Ref Range  LDL Direct 79 <130 mg/dL  Total Cholesterol 133 25 - 199 MG/DL  Triglycerides 164 (H) 10 - 150 MG/DL  HDL Cholesterol 30 (L) 35 -  135 MG/DL  Total Chol / HDL Cholesterol 4.4 <4.5   Non-HDL Cholesterol 103  Comment:  TARGET: <(LDL-C TARGET + 30)MG/DL     CBC and Differential (  11/12/2018 9:03 AM EDT)  Component Value Ref Range Performed At Pathologist Signature  WBC 8.5 4.8 - 10.8 x 10*3/uL [facility]    RBC 4.42 (L) 4.70 - 6.10 x 10*6/uL [facility]    Hemoglobin 13.3 (L) 14.0 - 18.0 G/DL [facility]    Hematocrit 39.3 (L) 42.0 - 52.0 % [facility]    MCV 88.8 80.0 - 94.0 FL [facility]    MCH 30.0 27.0 - 31.0 PG [facility]    MCHC 33.8 33.0 - 37.0 G/DL [facility]    RDW 14.6 (H) 11.5 - 14.5 % [facility]    Platelets 170      PRN Meds:. Medications Discontinued During This Encounter  Medication Reason  . dutasteride (AVODART) 0.5 MG capsule Error   Current Meds  Medication Sig  . allopurinol (ZYLOPRIM) 100 MG tablet Take 200 mg by mouth daily.   Marland Kitchen amLODipine (NORVASC) 10 MG tablet Take 10 mg by mouth daily.  Marland Kitchen aspirin 81 MG tablet Take 1 tablet (81 mg total) by mouth daily.  Marland Kitchen atorvastatin (LIPITOR) 80 MG tablet Take 1 tablet (80 mg total) by mouth daily.  . clopidogrel (PLAVIX) 75 MG tablet Take 75 mg by mouth daily.  . famotidine (PEPCID) 40 MG tablet Take 40 mg by mouth 2 (two) times daily.   . insulin detemir (LEVEMIR) 100 UNIT/ML injection Inject 0.4 mLs (40 Units total) into the skin at bedtime. (Patient taking differently: Inject 38 Units into the skin at bedtime. )  . metFORMIN (GLUCOPHAGE) 500 MG tablet Take 1 tablet (500 mg total) by mouth 2 (two) times daily with a meal.  . metoprolol tartrate (LOPRESSOR) 25 MG tablet Take 1 tablet (25 mg total) by mouth 2 (two) times daily.  Marland Kitchen  spironolactone (ALDACTONE) 25 MG tablet Take 1 tablet (25 mg total) by mouth daily.  . valsartan-hydrochlorothiazide (DIOVAN-HCT) 320-12.5 MG tablet TAKE 1 TABLET BY MOUTH EVERY MORNING  . [DISCONTINUED] dutasteride (AVODART) 0.5 MG capsule 1 capsule daily.    Cardiac Studies:   ABI 08/20/2018: This exam reveals normal perfusion of the right lower extremity (ABI 1.00) AND mildly decreased perfusion of the left lower extremity, noted at the post tibial artery level (ABI 0.88).  No significant change from 10/15/2017.  Peripheral arteriogram 08/26/2017: Bilateral iliac vessels reveal 30-40% diffuse luminal irregularity. Left TP trunk high-grade 99% stenosis extending into the dominant EKG S/P HawkOne directional atherectomy followed I 4.0 x 80 mm InPact Admiral DCB to 0% 2 vessel runoff.  ABI 10/15/2017: This exam reveals mildly decreased perfusion of the right lower extremity, noted at the post tibial artery level (ABI 0.90) with biphasic waveform and mildly decreased perfusion of the left lower extremity, noted at the post tibial artery level (ABI 0.90) with monophasic waveform in AT and biphasic waveform in PT. Compared to the study 08/15/2017, right ABI 0.82 and left ABI 0.73. This represents an improvement post revascularization.  Peripheral angiogram 09/23/2017: Severe Rt TP trunk, Rt PTA and Tr peroneal artery stenosis. Near total occlusion mid Rt ATA with collaterals seen distally. Three vessel below the knee run off with slow flow Successful orbital atherectomy with Diamondback 360 1.25 solid micro crown to Rt TP trunk, Rt PTA and Rt peroneal artery, followed by PTA with  3.0 X 60 mm balloon. 0% residual stenosis in Rt PTA, Rt peroneal. 10% residual stenosis in Rt TP trunk. Recommend continued medical management and PTA to Rt ATA if claudication symptoms persist.  Echocardiogram 03/30/2016: - Left ventricle: The cavity size was normal. Wall thickness was normal. Systolic function was  normal. The estimated ejection fraction was in the range of 50% to 55%. Probable severe hypokinesis and scarring of the basalinferolateral and inferior myocardium; in the distribution of the right coronary or left circumflex coronary artery. Features are consistent with a pseudonormal left ventricular filling pattern, with concomitant abnormal relaxation and increased filling pressure (grade 2 diastolic dysfunction). - Mitral valve: There was mild to moderate regurgitation. - Left atrium: The atrium was mildly dilated.  Carotid artery duplex  10/16/2018: Stenosis in the right internal carotid artery (50-69%).  Stenosis in the right external carotid artery (<50%). Stenosis in the left internal carotid artery (16-49%). Stenosis in the left common carotid artery (<50%). Right vertebral artery waveform shows high resistance, may suggest distal disease.  Antegrade right vertebral artery flow. Antegrade left vertebral artery flow. Compared to 10/15/2017, no significant change noted. Minimal progression in the left ICA stenosis. Follow up in six months is appropriate if clinically indicated. Coronary angiogram 05/13/2016: Normal LVEF. RCA ocluded. LAD 3 tandem prox to mid aneurysmal dilatation with 90% stenosis in the mid LAD. Smooth after stenosis. D1 prox 80%. Cx Occluded mid. RI-2 prox to mid 80%.  Assessment:     ICD-10-CM   1. Atherosclerosis of native coronary artery of native heart without angina pectoris  I25.10   2. PAD (peripheral artery disease) (HCC)  I73.9   3. Hyperlipidemia LDL goal <70  E78.5 ezetimibe (ZETIA) 10 MG tablet    Lipid Panel With LDL/HDL Ratio  4. Bilateral carotid artery stenosis  I65.23     EKG 10/27/2018: Normal sinus rhythm at 72 bpm, 1 PVC, inferior infarct old, anteroseptal infarct old. T wave inversion in inferolateral leads, cannot exclude ischemia.  Recommendations:   Drew Harrison  is a 68 y.o. male  with  coronary artery disease s/p CABGX4  (05/2016), post op CVA with no significant neurodeficit, hypertension, type 2 DM with vascular complications, bilateral moderate carotid stenosis, severe LE PAD with bilateral small vessel angioplasty in 2019 presents for 6 month OV.  Symptoms of claudication remained stable and in fact improved since last OV. Physical exam in left leg reveals slight decrease in palpable pulse, but asymptomatic.  Carotid artery stenosis has remained stable, we'll continue surveillance.    With regard to hyperlipidemia, LDL was at goal and now has slightly risen, he admits to not  able to exercise due to COVID-19 and will attempt to make changes.On his last OV Atorvastatin was increased to 80 mg. Will add Zetiain view of high CV risk. Blood pressure is well controlled, dyspnea is remained stable.  Weight loss again discussed with the patient.  I'll see him back in 6 months or sooner if problems. No TSH in a while and needs labs. DM is controlled. Will check lipids in 4-6 weeks.  Adrian Prows, MD, [facility] 01/28/2019, 9:53 AM [facility]. [place] Pager: [phone] Office: [phone] If no answer Cell [phone]

## 2019-02-22 ENCOUNTER — Other Ambulatory Visit: Payer: Self-pay | Admitting: Cardiology

## 2019-03-02 DIAGNOSIS — E119 Type 2 diabetes mellitus without complications: Secondary | ICD-10-CM | POA: Diagnosis not present

## 2019-03-09 DIAGNOSIS — E785 Hyperlipidemia, unspecified: Secondary | ICD-10-CM | POA: Diagnosis not present

## 2019-03-10 ENCOUNTER — Telehealth: Payer: Self-pay

## 2019-03-10 LAB — LIPID PANEL WITH LDL/HDL RATIO
Cholesterol, Total: 108 mg/dL (ref 100–199)
HDL: 36 mg/dL — ABNORMAL LOW (ref >39)
LDL Chol Calc (NIH): 45 mg/dL (ref 0–99)
LDL/HDL Ratio: 1.3 ratio (ref 0.0–3.6)
Triglycerides: 162 mg/dL — ABNORMAL HIGH (ref 0–149)
VLDL Cholesterol Cal: 27 mg/dL (ref 5–40)

## 2019-03-10 NOTE — Telephone Encounter (Signed)
-----   Message from Adrian Prows, MD sent at 03/10/2019  6:44 AM EST ----- Lipids are well controlled, triglycerides minimally elevated but improved from previous, avoid red meats and increase greens and veggies. Decrease startch. JG

## 2019-04-23 ENCOUNTER — Other Ambulatory Visit: Payer: Self-pay | Admitting: Cardiology

## 2019-04-23 DIAGNOSIS — E785 Hyperlipidemia, unspecified: Secondary | ICD-10-CM

## 2019-05-03 ENCOUNTER — Other Ambulatory Visit: Payer: Self-pay

## 2019-05-03 ENCOUNTER — Ambulatory Visit (INDEPENDENT_AMBULATORY_CARE_PROVIDER_SITE_OTHER): Payer: PPO

## 2019-05-03 DIAGNOSIS — I6523 Occlusion and stenosis of bilateral carotid arteries: Secondary | ICD-10-CM | POA: Diagnosis not present

## 2019-05-08 ENCOUNTER — Other Ambulatory Visit: Payer: Self-pay | Admitting: Cardiology

## 2019-05-08 DIAGNOSIS — I6523 Occlusion and stenosis of bilateral carotid arteries: Secondary | ICD-10-CM

## 2019-05-08 NOTE — Progress Notes (Signed)
Stable carotid and will recheck in a year.  I D/W patient.

## 2019-05-21 ENCOUNTER — Other Ambulatory Visit: Payer: Self-pay | Admitting: Cardiology

## 2019-05-25 DIAGNOSIS — I739 Peripheral vascular disease, unspecified: Secondary | ICD-10-CM | POA: Diagnosis not present

## 2019-05-25 DIAGNOSIS — M1A9XX0 Chronic gout, unspecified, without tophus (tophi): Secondary | ICD-10-CM | POA: Diagnosis not present

## 2019-05-25 DIAGNOSIS — E782 Mixed hyperlipidemia: Secondary | ICD-10-CM | POA: Diagnosis not present

## 2019-05-25 DIAGNOSIS — I10 Essential (primary) hypertension: Secondary | ICD-10-CM | POA: Diagnosis not present

## 2019-05-25 DIAGNOSIS — Z5181 Encounter for therapeutic drug level monitoring: Secondary | ICD-10-CM | POA: Diagnosis not present

## 2019-05-25 DIAGNOSIS — E119 Type 2 diabetes mellitus without complications: Secondary | ICD-10-CM | POA: Diagnosis not present

## 2019-05-25 DIAGNOSIS — I639 Cerebral infarction, unspecified: Secondary | ICD-10-CM | POA: Diagnosis not present

## 2019-05-25 DIAGNOSIS — I700 Atherosclerosis of aorta: Secondary | ICD-10-CM | POA: Diagnosis not present

## 2019-05-26 DIAGNOSIS — N401 Enlarged prostate with lower urinary tract symptoms: Secondary | ICD-10-CM | POA: Diagnosis not present

## 2019-05-26 DIAGNOSIS — E782 Mixed hyperlipidemia: Secondary | ICD-10-CM | POA: Diagnosis not present

## 2019-05-26 DIAGNOSIS — K219 Gastro-esophageal reflux disease without esophagitis: Secondary | ICD-10-CM | POA: Diagnosis not present

## 2019-05-26 DIAGNOSIS — E119 Type 2 diabetes mellitus without complications: Secondary | ICD-10-CM | POA: Diagnosis not present

## 2019-05-26 DIAGNOSIS — Z7902 Long term (current) use of antithrombotics/antiplatelets: Secondary | ICD-10-CM | POA: Diagnosis not present

## 2019-05-26 DIAGNOSIS — Z5181 Encounter for therapeutic drug level monitoring: Secondary | ICD-10-CM | POA: Diagnosis not present

## 2019-05-26 DIAGNOSIS — I739 Peripheral vascular disease, unspecified: Secondary | ICD-10-CM | POA: Diagnosis not present

## 2019-05-26 DIAGNOSIS — I2510 Atherosclerotic heart disease of native coronary artery without angina pectoris: Secondary | ICD-10-CM | POA: Diagnosis not present

## 2019-05-26 DIAGNOSIS — I119 Hypertensive heart disease without heart failure: Secondary | ICD-10-CM | POA: Diagnosis not present

## 2019-05-26 DIAGNOSIS — I700 Atherosclerosis of aorta: Secondary | ICD-10-CM | POA: Diagnosis not present

## 2019-05-26 DIAGNOSIS — M1A09X0 Idiopathic chronic gout, multiple sites, without tophus (tophi): Secondary | ICD-10-CM | POA: Diagnosis not present

## 2019-05-26 DIAGNOSIS — R351 Nocturia: Secondary | ICD-10-CM | POA: Diagnosis not present

## 2019-06-08 DIAGNOSIS — Z125 Encounter for screening for malignant neoplasm of prostate: Secondary | ICD-10-CM | POA: Diagnosis not present

## 2019-06-08 DIAGNOSIS — R351 Nocturia: Secondary | ICD-10-CM | POA: Diagnosis not present

## 2019-06-08 DIAGNOSIS — N401 Enlarged prostate with lower urinary tract symptoms: Secondary | ICD-10-CM | POA: Diagnosis not present

## 2019-06-15 ENCOUNTER — Other Ambulatory Visit: Payer: Self-pay | Admitting: Family Medicine

## 2019-06-15 DIAGNOSIS — Z87891 Personal history of nicotine dependence: Secondary | ICD-10-CM

## 2019-07-01 ENCOUNTER — Ambulatory Visit
Admission: RE | Admit: 2019-07-01 | Discharge: 2019-07-01 | Disposition: A | Discharge status: Home / Self Care | Payer: PPO | Source: Ambulatory Visit | Attending: Family Medicine | Admitting: Family Medicine

## 2019-07-01 DIAGNOSIS — Z87891 Personal history of nicotine dependence: Secondary | ICD-10-CM | POA: Diagnosis not present

## 2019-07-27 NOTE — Progress Notes (Signed)
Primary Physician/Referring:  Nickola Major, MD  Patient ID: Drew Harrison, male    DOB: [birth year], 69 y.o.   MRN: [id number]  Chief Complaint  Patient presents with  . Coronary Artery Disease    6 months  . Follow-up   HPI:    Drew Harrison  is a 69 y.o. Caucasian male with coronary artery disease s/p CABGX4 (05/2016), post op CVA with no significant neurodeficit, hypertension, type 2 DM with vascular complications, bilateral mod carotid stenosis, severe LE PAD.  He has had left popliteal and distal TP trunk angioplasty on 08/26/2017 and also right TP trunk and right  peroneal artery angioplasty on 09/23/2017.  ABI performed in May 2020 was unchanged. Symptoms of claudication have remained stable. He is tolerating medications well.  No chest pain.  Does have dyspnea on exertion with walking, but states that this is unchanged. He denies recent use of S/L NTG, no bleeding diathesis on DAPT.   Past Medical History:  Diagnosis Date  . Abnormal nuclear stress test   . Arthritis   . Cellulitis and abscess of hand 05/2017  . Coronary artery disease    3-vessel  . Diabetes mellitus without complication ([place])   . GERD (gastroesophageal reflux disease)   . Gout   . Headache   . History of tobacco abuse   . Hyperlipidemia   . Hypertension   . Ischemic stroke ([place])   . Stroke ([place]) 03/29/2016   left facial droop  . Stroke due to embolism ([place])   . Visual disturbance    Past Surgical History:  Procedure Laterality Date  . CORONARY ARTERY BYPASS GRAFT N/A 06/17/2016   Procedure: CORONARY ARTERY BYPASS GRAFTING (CABG) x four using left internal mammary artery and right greater saphenous leg vein using endoscope.;  Surgeon: [name], MD;  Location: [facility];  Service: Open Heart Surgery;  Laterality: N/A;  . LEFT HEART CATH AND CORONARY ANGIOGRAPHY N/A 05/14/2016   Procedure: Left Heart Cath and Coronary Angiography;  Surgeon: Adrian Prows, MD;  Location: [place] [facility];   Service: Cardiovascular;  Laterality: N/A;  . LOWER EXTREMITY ANGIOGRAPHY N/A 01/21/2017   Procedure: Lower Extremity Angiography;  Surgeon: Adrian Prows, MD;  Location: [facility];  Service: Cardiovascular;  Laterality: N/A;  . LOWER EXTREMITY ANGIOGRAPHY Left 08/26/2017   Procedure: LOWER EXTREMITY ANGIOGRAPHY;  Surgeon: Adrian Prows, MD;  Location: [facility];  Service: Cardiovascular;  Laterality: Left;  . LOWER EXTREMITY ANGIOGRAPHY Right 09/23/2017   Procedure: LOWER EXTREMITY ANGIOGRAPHY;  Surgeon: Nigel Mormon, MD;  Location: [facility];  Service: Cardiovascular;  Laterality: Right;  . PERIPHERAL VASCULAR ATHERECTOMY  08/26/2017   Procedure: PERIPHERAL VASCULAR ATHERECTOMY;  Surgeon: Adrian Prows, MD;  Location: [facility];  Service: Cardiovascular;;  . PERIPHERAL VASCULAR ATHERECTOMY  09/23/2017   Procedure: PERIPHERAL VASCULAR ATHERECTOMY;  Surgeon: Nigel Mormon, MD;  Location: [place] [facility];  Service: Cardiovascular;;  . PERIPHERAL VASCULAR BALLOON ANGIOPLASTY  08/26/2017   Procedure: PERIPHERAL VASCULAR BALLOON ANGIOPLASTY;  Surgeon: Adrian Prows, MD;  Location: [place];  Service: Cardiovascular;;  . PERIPHERAL VASCULAR BALLOON ANGIOPLASTY  09/23/2017   Procedure: PERIPHERAL VASCULAR BALLOON ANGIOPLASTY;  Surgeon: Nigel Mormon, MD;  Location: [facility];  Service: Cardiovascular;;  . TEE WITHOUT CARDIOVERSION N/A 06/17/2016   Procedure: TRANSESOPHAGEAL ECHOCARDIOGRAM (TEE);  Surgeon: [name], MD;  Location: [facility];  Service: Open Heart Surgery;  Laterality: N/A;  .  TONSILLECTOMY     Family History  Problem Relation Age of Onset  . Heart disease Father   . Colon cancer Neg Hx   . Esophageal cancer Neg Hx   . Rectal cancer Neg Hx     Social History   Tobacco Use  . Smoking status: Former Smoker    Packs/day: 1.50    Years: 48.00    Pack years: 72.00    Types: Cigarettes    Quit date: 03/29/2016    Years since  quitting: 3.3  . Smokeless tobacco: Never Used  Substance Use Topics  . Alcohol use: Yes    Alcohol/week: 14.0 standard drinks    Types: 14 Cans of beer per week   Marital Status: Divorced  ROS  Review of Systems  Cardiovascular: Positive for claudication (stable) and leg swelling. Negative for chest pain and dyspnea on exertion.  Gastrointestinal: Negative for melena.   Objective  Height 6' (1.829 m), weight 221 lb (100.2 kg).  Vitals with BMI 07/28/2019 01/28/2019 10/27/2018  Height 6' 0"  6' 0"  -  Weight 221 lbs 227 lbs -  BMI 32.12 24.82 -  Systolic - 500 370  Diastolic - 66 70  Pulse - 86 73     Physical Exam  Constitutional: He appears well-developed and well-nourished. No distress.  Cardiovascular: Normal rate, regular rhythm and intact distal pulses. Exam reveals no gallop.  No murmur heard. Pulses:      Carotid pulses are on the right side with bruit.      Femoral pulses are 2+ on the right side and 2+ on the left side.      Popliteal pulses are 1+ on the right side and 2+ on the left side.       Dorsalis pedis pulses are 0 on the right side and 0 on the left side.       Posterior tibial pulses are 2+ on the right side and 2+ on the left side.  Bilateral 1 + pitting edema, Bilateral cool toes, Bilateral chronic venostasis pigmentation. No JVD.   Pulmonary/Chest: Effort normal and breath sounds normal. No accessory muscle usage. No respiratory distress.  Abdominal: Soft. Bowel sounds are normal.   Laboratory examination:   No results for input(s): NA, K, CL, CO2, GLUCOSE, BUN, CREATININE, CALCIUM, GFRNONAA, GFRAA in the last 8760 hours. CrCl cannot be calculated (Patient's most recent lab result is older than the maximum 21 days allowed.).  CMP Latest Ref Rng & Units 06/17/2017 07/01/2016 06/22/2016  Glucose 65 - 99 mg/dL 118(H) - 103(H)  BUN 6 - 20 mg/dL 17 14 25(H)  Creatinine 0.61 - 1.24 mg/dL 0.98 0.9 1.11  Sodium 135 - 145 mmol/L 140 141 136  Potassium 3.5 - 5.1  mmol/L 3.9 4.2 3.8  Chloride 101 - 111 mmol/L 106 - 104  CO2 22 - 32 mmol/L 23 - 24  Calcium 8.9 - 10.3 mg/dL 9.5 - 7.9(L)  Total Protein 6.5 - 8.1 g/dL - - -  Total Bilirubin 0.3 - 1.2 mg/dL - - -  Alkaline Phos 38 - 126 U/L - - -  AST 15 - 41 U/L - - -  ALT 17 - 63 U/L - - -   CBC Latest Ref Rng & Units 06/17/2017 07/01/2016 06/22/2016  WBC 4.0 - 10.5 K/uL 7.5 6.9 6.5  Hemoglobin 13.0 - 17.0 g/dL 13.3 8.0(A) 8.9(L)  Hematocrit 39.0 - 52.0 % 39.5 25(A) 27.2(L)  Platelets 150 - 400 K/uL 171 280 155   Lipid Panel  Component Value Date/Time   CHOL 108 03/09/2019 0818   TRIG 162 (H) 03/09/2019 0818   HDL 36 (L) 03/09/2019 0818   CHOLHDL 4.1 01/18/2019 0806   CHOLHDL 4.0 06/22/2016 0234   VLDL 28 06/22/2016 0234   LDLCALC 45 03/09/2019 0818   HEMOGLOBIN A1C Lab Results  Component Value Date   HGBA1C 12.2 (H) 06/22/2016   MPG 303 06/22/2016   TSH No results for input(s): TSH in the last 8760 hours.  External labs:   05/25/2019: WBC 8, RBC 4.28, hemoglobin 13, hematocrit 38, MCV 89 platelets 165. Glucose 143. BUN/ creatinine 25/ 1.24, Sodium 135, Potassium 4.8, ALK phosphatase 105, ALT 32, AST 20, Total bilirubin 0.8, Total protein 6.7. Cholesterol 112, triglycerides 124, HDL 33, LDL 55 HB A1C  6.4. PSA 1.63.  PCP labs 05/12/2018: Cholesterol 145, triglycerides 136, HDL 46, LDL 86.  Potassium 4.8, creatinine 1.29, glucose 162, EGFR 57, CMP otherwise normal.  Iron panel normal.  Hemoglobin A1c 6.5%.  RBC 4.37, hemoglobin 13.1, hematocrit 39.1, CBC otherwise normal.   Medications and allergies  No Known Allergies   Current Outpatient Medications  Medication Instructions  . allopurinol (ZYLOPRIM) 200 mg, Oral, Daily  . amLODipine (NORVASC) 10 mg, Oral, Daily  . aspirin 81 mg, Oral, Daily  . atorvastatin (LIPITOR) 80 mg, Oral, Daily  . clopidogrel (PLAVIX) 75 mg, Oral, Daily  . ezetimibe (ZETIA) 10 MG tablet TAKE 1 TABLET(10 MG) BY MOUTH DAILY  . famotidine (PEPCID) 40 mg,  Oral, 2 times daily  . insulin detemir (LEVEMIR) 40 Units, Subcutaneous, Daily at bedtime  . metFORMIN (GLUCOPHAGE) 500 mg, Oral, 2 times daily with meals  . metoprolol tartrate (LOPRESSOR) 25 mg, Oral, 2 times daily  . spironolactone (ALDACTONE) 25 mg, Oral, Daily  . valsartan-hydrochlorothiazide (DIOVAN-HCT) 320-12.5 MG tablet TAKE 1 TABLET BY MOUTH EVERY MORNING   Radiology:   CT chest without contrast 07/01/2019: 1. Lung-RADS 2, benign appearance or behavior. Continue annual screening with low-dose chest CT without contrast in 12 months. 2. Hepatic steatosis. 3. Aortic Atherosclerosis and Emphysema. 4. Bilateral gynecomastia.  Cardiac Studies:   Echocardiogram 03/30/2016: - Left ventricle: The cavity size was normal. Wall thickness was  normal. Systolic function was normal. The estimated ejection  fraction was in the range of 50% to 55%. Probable severe  hypokinesis and scarring of the basalinferolateral and inferior  myocardium; in the distribution of the right coronary or left  circumflex coronary artery. Features are consistent with a  pseudonormal left ventricular filling pattern, with concomitant  abnormal relaxation and increased filling pressure (grade 2  diastolic dysfunction). - Mitral valve: There was mild to moderate regurgitation. - Left atrium: The atrium was mildly dilated.  Coronary angiogram 05/13/2016: Normal LVEF. RCA ocluded. LAD 3 tandem prox to mid aneurysmal dilatation with 90% stenosis in the mid LAD. Smooth after stenosis. D1 prox 80%. Cx Occluded mid. RI-2 prox to mid 80%.  Peripheral arteriogram 08/26/2017: Bilateral iliac vessels reveal 30-40% diffuse luminal irregularity. Left TP trunk high-grade 99% stenosis S/P HawkOne directional atherectomy followed I 4.0 x 80 mm InPact Admiral DCB to 0% 2 vessel runoff.  Peripheral angiogram 09/23/2017: Severe Rt TP trunk, Rt PTA and Rt peroneal artery stenosis. Near total occlusion mid Rt ATA with collaterals seen  distally. Three vessel below the knee run off with slow flow Successful orbital atherectomy with Diamondback 360 1.25 solid micro crown to Rt TP trunk, Rt PTA and Rt peroneal artery, followed by PTA with 3.0 X 60 mm balloon. 0% residual  stenosis in Rt PTA, Rt peroneal. 10% residual stenosis in Rt TP trunk. Recommend continued medical management and PTA to Rt ATA if claudication symptoms persist.  ABI 10/15/2017: This exam reveals mildly decreased perfusion of the right lower extremity, noted at the post tibial artery level (ABI 0.90) with biphasic waveform and mildly decreased perfusion of the left lower extremity, noted at the post tibial artery level (ABI 0.90) with monophasic waveform in AT and biphasic waveform in PT. Compared to the study 08/15/2017, right ABI 0.82 and left ABI 0.73. This represents an improvement post revascularization.  ABI 08/20/2018: This exam reveals normal perfusion of the right lower extremity (ABI 1.00) AND mildly decreased perfusion of the left lower extremity, noted at the post tibial artery level (ABI 0.88). No significant change from 10/15/2017.  Carotid artery duplex  05/03/2019:  Doppler velocity suggests stenosis in the right internal carotid artery (16-49%).  Doppler velocity suggests stenosis in the left internal carotid artery (1-15%). Doppler velocity suggests stenosis in the left common carotid artery (<50%).  There is moderate to severe diffuse heterogeneous plaque especially in bilateral CCA and bulb.  Antegrade right vertebral artery flow. Antegrade left vertebral artery flow.  Follow up in one year is appropriate if clinically indicated.  EKG  EKG 07/28/2019: Normal sinus rhythm rate of 69 bpm, normal axis, inferior infarct old.  Poor recollection apical infarct old.  Nonspecific T abnormality.  No significant change from 10/27/2018.  Assessment     ICD-10-CM   1. Atherosclerosis of native coronary artery of native heart without angina pectoris   I25.10 EKG 12-Lead  2. PAD (peripheral artery disease) (HCC)  I73.9   3. Hyperlipidemia LDL goal <70  E78.5   4. Bilateral carotid artery stenosis  I65.23     No orders of the defined types were placed in this encounter.   There are no discontinued medications.  Recommendations:   Drew Harrison  is a 69 y.o. 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  Symptoms of claudication remained stable.  Carotid artery stenosis has improved. Continue surveillance Dopplers.  With regard to hyperlipidemia, LDL was at goal.  External labs reviewed.  Overall I am extremely pleased, suspect having made changes to his lifestyle, weight loss, controlled diabetes, control of lipids and hypertension has led to improvement in overall cardiovascular status.  As he has remained stable, I will discontinue aspirin to reduce the risk of bleeding complications, continue Plavix indefinitely.  I will see him back in 6 months.  Adrian Prows, MD, [facility] 07/28/2019, 10:29 AM [facility] Pager: [phone] Office: [phone]

## 2019-07-28 ENCOUNTER — Ambulatory Visit: Payer: PPO | Admitting: Cardiology

## 2019-07-28 ENCOUNTER — Other Ambulatory Visit: Payer: Self-pay

## 2019-07-28 ENCOUNTER — Encounter: Payer: Self-pay | Admitting: Cardiology

## 2019-07-28 VITALS — BP 130/70 | HR 73 | Temp 97.3°F | Resp 16 | Ht 72.0 in | Wt 221.0 lb

## 2019-07-28 DIAGNOSIS — E785 Hyperlipidemia, unspecified: Secondary | ICD-10-CM

## 2019-07-28 DIAGNOSIS — I2510 Atherosclerotic heart disease of native coronary artery without angina pectoris: Secondary | ICD-10-CM

## 2019-07-28 DIAGNOSIS — I739 Peripheral vascular disease, unspecified: Secondary | ICD-10-CM | POA: Diagnosis not present

## 2019-07-28 DIAGNOSIS — I6523 Occlusion and stenosis of bilateral carotid arteries: Secondary | ICD-10-CM

## 2019-08-19 ENCOUNTER — Other Ambulatory Visit: Payer: Self-pay | Admitting: Cardiology

## 2019-11-06 ENCOUNTER — Other Ambulatory Visit: Payer: Self-pay | Admitting: Cardiology

## 2019-11-12 DIAGNOSIS — M1A9XX0 Chronic gout, unspecified, without tophus (tophi): Secondary | ICD-10-CM | POA: Diagnosis not present

## 2019-11-12 DIAGNOSIS — Z5181 Encounter for therapeutic drug level monitoring: Secondary | ICD-10-CM | POA: Diagnosis not present

## 2019-11-12 DIAGNOSIS — I739 Peripheral vascular disease, unspecified: Secondary | ICD-10-CM | POA: Diagnosis not present

## 2019-11-12 DIAGNOSIS — I10 Essential (primary) hypertension: Secondary | ICD-10-CM | POA: Diagnosis not present

## 2019-11-12 DIAGNOSIS — E782 Mixed hyperlipidemia: Secondary | ICD-10-CM | POA: Diagnosis not present

## 2019-11-12 DIAGNOSIS — I700 Atherosclerosis of aorta: Secondary | ICD-10-CM | POA: Diagnosis not present

## 2019-11-12 DIAGNOSIS — I639 Cerebral infarction, unspecified: Secondary | ICD-10-CM | POA: Diagnosis not present

## 2019-11-12 DIAGNOSIS — E119 Type 2 diabetes mellitus without complications: Secondary | ICD-10-CM | POA: Diagnosis not present

## 2019-11-15 ENCOUNTER — Other Ambulatory Visit: Payer: Self-pay | Admitting: Cardiology

## 2019-11-15 DIAGNOSIS — E785 Hyperlipidemia, unspecified: Secondary | ICD-10-CM

## 2019-11-17 DIAGNOSIS — K219 Gastro-esophageal reflux disease without esophagitis: Secondary | ICD-10-CM | POA: Diagnosis not present

## 2019-11-17 DIAGNOSIS — N179 Acute kidney failure, unspecified: Secondary | ICD-10-CM | POA: Diagnosis not present

## 2019-11-17 DIAGNOSIS — I700 Atherosclerosis of aorta: Secondary | ICD-10-CM | POA: Diagnosis not present

## 2019-11-17 DIAGNOSIS — E119 Type 2 diabetes mellitus without complications: Secondary | ICD-10-CM | POA: Diagnosis not present

## 2019-11-17 DIAGNOSIS — H60331 Swimmer's ear, right ear: Secondary | ICD-10-CM | POA: Diagnosis not present

## 2019-11-17 DIAGNOSIS — I739 Peripheral vascular disease, unspecified: Secondary | ICD-10-CM | POA: Diagnosis not present

## 2019-11-17 DIAGNOSIS — I639 Cerebral infarction, unspecified: Secondary | ICD-10-CM | POA: Diagnosis not present

## 2019-11-17 DIAGNOSIS — M1A9XX0 Chronic gout, unspecified, without tophus (tophi): Secondary | ICD-10-CM | POA: Diagnosis not present

## 2019-11-17 DIAGNOSIS — Z0000 Encounter for general adult medical examination without abnormal findings: Secondary | ICD-10-CM | POA: Diagnosis not present

## 2019-11-17 DIAGNOSIS — I10 Essential (primary) hypertension: Secondary | ICD-10-CM | POA: Diagnosis not present

## 2019-11-17 DIAGNOSIS — R252 Cramp and spasm: Secondary | ICD-10-CM | POA: Diagnosis not present

## 2019-11-17 DIAGNOSIS — N401 Enlarged prostate with lower urinary tract symptoms: Secondary | ICD-10-CM | POA: Diagnosis not present

## 2019-11-17 DIAGNOSIS — E782 Mixed hyperlipidemia: Secondary | ICD-10-CM | POA: Diagnosis not present

## 2019-12-08 DIAGNOSIS — N179 Acute kidney failure, unspecified: Secondary | ICD-10-CM | POA: Diagnosis not present

## 2019-12-08 DIAGNOSIS — R252 Cramp and spasm: Secondary | ICD-10-CM | POA: Diagnosis not present

## 2019-12-22 ENCOUNTER — Telehealth: Payer: Self-pay | Admitting: Gastroenterology

## 2019-12-22 NOTE — Telephone Encounter (Signed)
Spoke with patient, he states that he is okay with waiting until 02/2021 for repeat colon. Advised patient that new guidelines have come out since his last colon in 2018, pt is aware that he is not considered high risk at this time. Recall updated in epic

## 2019-12-22 NOTE — Telephone Encounter (Signed)
Pt called inquiring about recall colon as he is due in December of this year. Pt stated that he spoke with his insurance [employer] and was informed that they only cover colon every 4 years unless it is coded as high risk, which is covered every 2 years. Pt needs to know if he falls in the high risk category. Pls call him.

## 2019-12-22 NOTE — Telephone Encounter (Signed)
Please advise, thank you.

## 2019-12-22 NOTE — Telephone Encounter (Signed)
Thanks [employer].  Per updated surveillance guidelines that have come out since his last colonoscopy, given he had 3 small colon polyps that were adenomas on his last exam, he would not be due for another colonoscopy until 3 to 5 years after his last exam.  I think it is okay to perform his next exam in 12//2022 if that works for him.  Thanks

## 2020-01-27 ENCOUNTER — Other Ambulatory Visit: Payer: Self-pay

## 2020-01-27 ENCOUNTER — Encounter: Payer: Self-pay | Admitting: Cardiology

## 2020-01-27 ENCOUNTER — Ambulatory Visit: Payer: PPO | Admitting: Cardiology

## 2020-01-27 VITALS — BP 136/70 | HR 76 | Resp 16 | Ht 72.0 in | Wt 221.0 lb

## 2020-01-27 DIAGNOSIS — R0600 Dyspnea, unspecified: Secondary | ICD-10-CM

## 2020-01-27 DIAGNOSIS — E1142 Type 2 diabetes mellitus with diabetic polyneuropathy: Secondary | ICD-10-CM | POA: Diagnosis not present

## 2020-01-27 DIAGNOSIS — I10 Essential (primary) hypertension: Secondary | ICD-10-CM

## 2020-01-27 DIAGNOSIS — R600 Localized edema: Secondary | ICD-10-CM | POA: Diagnosis not present

## 2020-01-27 DIAGNOSIS — Z794 Long term (current) use of insulin: Secondary | ICD-10-CM

## 2020-01-27 DIAGNOSIS — R0609 Other forms of dyspnea: Secondary | ICD-10-CM

## 2020-01-27 DIAGNOSIS — I2510 Atherosclerotic heart disease of native coronary artery without angina pectoris: Secondary | ICD-10-CM

## 2020-01-27 DIAGNOSIS — I739 Peripheral vascular disease, unspecified: Secondary | ICD-10-CM | POA: Diagnosis not present

## 2020-01-27 MED ORDER — VALSARTAN 320 MG PO TABS: 320.0000 mg | ORAL_TABLET | Freq: Every day | ORAL | 3 refills | Status: DC

## 2020-01-27 MED ORDER — CHLORTHALIDONE 25 MG PO TABS: 25.0000 mg | ORAL_TABLET | Freq: Every day | ORAL | 3 refills | Status: DC

## 2020-01-27 MED ORDER — VALSARTAN 320 MG PO TABS: 160.0000 mg | ORAL_TABLET | Freq: Every day | ORAL | 3 refills | Status: DC

## 2020-01-27 NOTE — Patient Instructions (Signed)
Labs in 1-2 weeks

## 2020-01-27 NOTE — Progress Notes (Signed)
Primary Physician/Referring:  Nickola Major, MD  Patient ID: Drew Harrison, male    DOB: 06-06-1950, 69 y.o.   MRN: [id number]  Chief Complaint  Patient presents with  . Coronary Artery Disease  . PAD  . Hypertension  . Carotid Stenosis  . Follow-up    6 month   HPI:    Drew Harrison  is a 69 y.o. Caucasian male with coronary artery disease s/p CABGX4 (05/2016), post op CVA with no significant neurodeficit, hypertension, type 2 DM with vascular complications, bilateral modorate carotid stenosis, severe LE PAD. He has had left popliteal and distal TP trunk angioplasty on 08/26/2017 and also right TP trunk and right  peroneal artery angioplasty on 09/23/2017.  ABI performed in May 2020 was unchanged.   Presents for 6 month follow up. At last visit aspirin was discontinued and patient was instructed to continue Plavix indefinitely.  He continues to tolerate Plavix without any masses.  He reports continued stable dyspnea on exertion as well as stable symptoms of claudication primarily located in his thighs.  Denies chest pain, palpitations, orthopnea, PND, symptoms suggestive of TIA or stroke.  He does have mild swelling of his lower extremities, worse on the left side.  In September 2021 his PCP stopped spironolactone due to increasing serum creatinine.  Since stopping this the swelling in his left leg has worsened.  Patient has recently started monitoring his blood pressure at home reporting readings 122-130/70s.  Patient is interested in receiving physical therapy or some sort of regimented exercise routine to improve his claudication symptoms. No use of sublingual NTG.   Past Medical History:  Diagnosis Date  . Abnormal nuclear stress test   . Arthritis   . Cellulitis and abscess of hand 05/2017  . Coronary artery disease    3-vessel  . Diabetes mellitus without complication ([place])   . GERD (gastroesophageal reflux disease)   . Gout   . Headache   . History of tobacco abuse   .  Hyperlipidemia   . Hypertension   . Ischemic stroke ([place])   . Stroke ([place]) 03/29/2016   left facial droop  . Stroke due to embolism ([place])   . Visual disturbance    Past Surgical History:  Procedure Laterality Date  . CORONARY ARTERY BYPASS GRAFT N/A 06/17/2016   Procedure: CORONARY ARTERY BYPASS GRAFTING (CABG) x four using left internal mammary artery and right greater saphenous leg vein using endoscope.;  Surgeon: [name], MD;  Location: [facility];  Service: Open Heart Surgery;  Laterality: N/A;  . LEFT HEART CATH AND CORONARY ANGIOGRAPHY N/A 05/14/2016   Procedure: Left Heart Cath and Coronary Angiography;  Surgeon: Adrian Prows, MD;  Location: [place] [facility];  Service: Cardiovascular;  Laterality: N/A;  . LOWER EXTREMITY ANGIOGRAPHY N/A 01/21/2017   Procedure: Lower Extremity Angiography;  Surgeon: Adrian Prows, MD;  Location: [place] [facility];  Service: Cardiovascular;  Laterality: N/A;  . LOWER EXTREMITY ANGIOGRAPHY Left 08/26/2017   Procedure: LOWER EXTREMITY ANGIOGRAPHY;  Surgeon: Adrian Prows, MD;  Location: [place] [facility];  Service: Cardiovascular;  Laterality: Left;  . LOWER EXTREMITY ANGIOGRAPHY Right 09/23/2017   Procedure: LOWER EXTREMITY ANGIOGRAPHY;  Surgeon: Nigel Mormon, MD;  Location: [place] [facility];  Service: Cardiovascular;  Laterality: Right;  . PERIPHERAL VASCULAR ATHERECTOMY  08/26/2017   Procedure: PERIPHERAL VASCULAR ATHERECTOMY;  Surgeon: Adrian Prows, MD;  Location: [place] [facility];  Service: Cardiovascular;;  . PERIPHERAL VASCULAR ATHERECTOMY  09/23/2017  Procedure: PERIPHERAL VASCULAR ATHERECTOMY;  Surgeon: Nigel Mormon, MD;  Location: [place] [facility];  Service: Cardiovascular;;  . PERIPHERAL VASCULAR BALLOON ANGIOPLASTY  08/26/2017   Procedure: PERIPHERAL VASCULAR BALLOON ANGIOPLASTY;  Surgeon: Adrian Prows, MD;  Location: [place] [facility];  Service: Cardiovascular;;  . PERIPHERAL VASCULAR BALLOON ANGIOPLASTY  09/23/2017   Procedure:  PERIPHERAL VASCULAR BALLOON ANGIOPLASTY;  Surgeon: Nigel Mormon, MD;  Location: [place] [facility];  Service: Cardiovascular;;  . TEE WITHOUT CARDIOVERSION N/A 06/17/2016   Procedure: TRANSESOPHAGEAL ECHOCARDIOGRAM (TEE);  Surgeon: [name], MD;  Location: [place] [facility];  Service: Open Heart Surgery;  Laterality: N/A;  . TONSILLECTOMY     Family History  Problem Relation Age of Onset  . Heart disease Father   . Pancreatic cancer Brother   . Colon cancer Neg Hx   . Esophageal cancer Neg Hx   . Rectal cancer Neg Hx     Social History   Tobacco Use  . Smoking status: Former Smoker    Packs/day: 1.50    Years: 48.00    Pack years: 72.00    Types: Cigarettes    Quit date: 03/29/2016    Years since quitting: 3.8  . Smokeless tobacco: Never Used  Substance Use Topics  . Alcohol use: Yes    Alcohol/week: 14.0 standard drinks    Types: 14 Cans of beer per week    Comment: occas  Marital Status: Divorced   ROS  Review of Systems  Constitutional: Negative for malaise/fatigue and weight gain.  Cardiovascular: Positive for claudication (stable) and leg swelling. Negative for chest pain, dyspnea on exertion, near-syncope, orthopnea, palpitations, paroxysmal nocturnal dyspnea and syncope.  Respiratory: Negative for shortness of breath.   Hematologic/Lymphatic: Does not bruise/bleed easily.  Gastrointestinal: Negative for melena.  Neurological: Negative for dizziness and weakness.   Objective  Blood pressure 136/70, pulse 76, resp. rate 16, height 6' (1.829 m), weight 221 lb (100.2 kg), SpO2 95 %.  Vitals with BMI 01/27/2020 07/28/2019 01/28/2019  Height _0  _1  _2   Weight 221 lbs 221 lbs 227 lbs  BMI 29.97 96.29 52.84  Systolic 132 440 102  Diastolic 70 70 66  Pulse 76 73 86     Physical Exam Constitutional:      General: He is not in acute distress.    Appearance: He is well-developed.  Cardiovascular:     Rate and Rhythm: Normal rate and regular rhythm.     Pulses:  Intact distal pulses.          Carotid pulses are on the right side with bruit.      Femoral pulses are 2+ on the right side and 2+ on the left side.      Popliteal pulses are 1+ on the right side and 2+ on the left side.       Dorsalis pedis pulses are 0 on the right side and 0 on the left side.       Posterior tibial pulses are 2+ on the right side and 2+ on the left side.     Heart sounds: No murmur heard.  No gallop.      Comments: 1 + pitting edema left lower leg. Trace edema right lower leg. Bilateral cool toes, Bilateral chronic venostasis pigmentation. No JVD.  Pulmonary:     Effort: Pulmonary effort is normal. No accessory muscle usage or respiratory distress.     Breath sounds: Normal breath sounds.  Abdominal:     General:  Bowel sounds are normal.     Palpations: Abdomen is soft.    Laboratory examination:   No results for input(s): NA, K, CL, CO2, GLUCOSE, BUN, CREATININE, CALCIUM, GFRNONAA, GFRAA in the last 8760 hours. CrCl cannot be calculated (Patient's most recent lab result is older than the maximum 21 days allowed.).  CMP Latest Ref Rng & Units 06/17/2017 07/01/2016 06/22/2016  Glucose 65 - 99 mg/dL 118(H) - 103(H)  BUN 6 - 20 mg/dL 17 14 25(H)  Creatinine 0.61 - 1.24 mg/dL 0.98 0.9 1.11  Sodium 135 - 145 mmol/L 140 141 136  Potassium 3.5 - 5.1 mmol/L 3.9 4.2 3.8  Chloride 101 - 111 mmol/L 106 - 104  CO2 22 - 32 mmol/L 23 - 24  Calcium 8.9 - 10.3 mg/dL 9.5 - 7.9(L)  Total Protein 6.5 - 8.1 g/dL - - -  Total Bilirubin 0.3 - 1.2 mg/dL - - -  Alkaline Phos 38 - 126 U/L - - -  AST 15 - 41 U/L - - -  ALT 17 - 63 U/L - - -   CBC Latest Ref Rng & Units 06/17/2017 07/01/2016 06/22/2016  WBC 4.0 - 10.5 K/uL 7.5 6.9 6.5  Hemoglobin 13.0 - 17.0 g/dL 13.3 8.0(A) 8.9(L)  Hematocrit 39 - 52 % 39.5 25(A) 27.2(L)  Platelets 150 - 400 K/uL 171 280 155   Lipid Panel     Component Value Date/Time   CHOL 108 03/09/2019 0818   TRIG 162 (H) 03/09/2019 0818   HDL 36 (L)  03/09/2019 0818   CHOLHDL 4.1 01/18/2019 0806   CHOLHDL 4.0 06/22/2016 0234   VLDL 28 06/22/2016 0234   LDLCALC 45 03/09/2019 0818   HEMOGLOBIN A1C Lab Results  Component Value Date   HGBA1C 12.2 (H) 06/22/2016   MPG 303 06/22/2016   TSH No results for input(s): TSH in the last 8760 hours.  External labs:  12/08/2019: Sodium 130, potassium 5.2, BUN 26, creatinine 6.2, EGFR 43 Magnesium 1.8  11/12/2019  A1c 6.0% Sodium 131, BUN 29, potassium 5.3, creatinine 1.52, EGFR 46 LDL 62, total cholesterol 126, triglycerides 120, HDL 41, non-HDL 85 Hemoglobin 13.9, hematocrit 40.2, platelets 164  05/25/2019: WBC 8, RBC 4.28, hemoglobin 13, hematocrit 38, MCV 89 platelets 165. Glucose 143. BUN/ creatinine 25/ 1.24, Sodium 135, Potassium 4.8, ALK phosphatase 105, ALT 32, AST 20, Total bilirubin 0.8, Total protein 6.7. Cholesterol 112, triglycerides 124, HDL 33, LDL 55 HB A1C  6.4. PSA 1.63.  PCP labs 05/12/2018: Cholesterol 145, triglycerides 136, HDL 46, LDL 86.  Potassium 4.8, creatinine 1.29, glucose 162, EGFR 57, CMP otherwise normal.  Iron panel normal.  Hemoglobin A1c 6.5%.  RBC 4.37, hemoglobin 13.1, hematocrit 39.1, CBC otherwise normal.   Medications and allergies  No Known Allergies   Current Outpatient Medications  Medication Instructions  . allopurinol (ZYLOPRIM) 200 mg, Oral, Daily  . amLODipine (NORVASC) 10 mg, Oral, Daily  . atorvastatin (LIPITOR) 80 MG tablet TAKE 1 TABLET(80 MG) BY MOUTH DAILY  . BD PEN NEEDLE NANO 2ND GEN 32G X 4 MM MISC USE TWICE DAILY WITH LEVEMIR  . chlorthalidone (HYGROTON) 25 mg, Oral, Daily  . clopidogrel (PLAVIX) 75 mg, Oral, Daily  . clotrimazole-betamethasone (LOTRISONE) lotion Topical, 2 times daily  . ezetimibe (ZETIA) 10 MG tablet TAKE 1 TABLET(10 MG) BY MOUTH DAILY  . famotidine (PEPCID) 40 mg, Oral, 2 times daily  . insulin detemir (LEVEMIR) 40 Units, Subcutaneous, Daily at bedtime  . metFORMIN (GLUCOPHAGE) 500 mg, Oral, 2 times daily  with meals  . metoprolol tartrate (LOPRESSOR) 25 mg, Oral, 2 times daily  . valsartan (DIOVAN) 320 mg, Oral, Daily   Radiology:   CT chest without contrast 07/01/2019: 1. Lung-RADS 2, benign appearance or behavior. Continue annual screening with low-dose chest CT without contrast in 12 months. 2. Hepatic steatosis. 3. Aortic Atherosclerosis and Emphysema. 4. Bilateral gynecomastia.  Cardiac Studies:   Echocardiogram 03/30/2016: - Left ventricle: The cavity size was normal. Wall thickness was  normal. Systolic function was normal. The estimated ejection  fraction was in the range of 50% to 55%. Probable severe  hypokinesis and scarring of the basalinferolateral and inferior  myocardium; in the distribution of the right coronary or left  circumflex coronary artery. Features are consistent with a  pseudonormal left ventricular filling pattern, with concomitant  abnormal relaxation and increased filling pressure (grade 2  diastolic dysfunction). - Mitral valve: There was mild to moderate regurgitation. - Left atrium: The atrium was mildly dilated.  Coronary angiogram 05/13/2016: Normal LVEF. RCA ocluded. LAD 3 tandem prox to mid aneurysmal dilatation with 90% stenosis in the mid LAD. Smooth after stenosis. D1 prox 80%. Cx Occluded mid. RI-2 prox to mid 80%.  Peripheral arteriogram 08/26/2017: Bilateral iliac vessels reveal 30-40% diffuse luminal irregularity. Left TP trunk high-grade 99% stenosis S/P HawkOne directional atherectomy followed I 4.0 x 80 mm InPact Admiral DCB to 0% 2 vessel runoff.  Peripheral angiogram 09/23/2017: Severe Rt TP trunk, Rt PTA and Rt peroneal artery stenosis. Near total occlusion mid Rt ATA with collaterals seen distally. Three vessel below the knee run off with slow flow Successful orbital atherectomy with Diamondback 360 1.25 solid micro crown to Rt TP trunk, Rt PTA and Rt peroneal artery, followed by PTA with 3.0 X 60 mm balloon. 0% residual stenosis in Rt PTA, Rt  peroneal. 10% residual stenosis in Rt TP trunk. Recommend continued medical management and PTA to Rt ATA if claudication symptoms persist.  ABI 10/15/2017: This exam reveals mildly decreased perfusion of the right lower extremity, noted at the post tibial artery level (ABI 0.90) with biphasic waveform and mildly decreased perfusion of the left lower extremity, noted at the post tibial artery level (ABI 0.90) with monophasic waveform in AT and biphasic waveform in PT. Compared to the study 08/15/2017, right ABI 0.82 and left ABI 0.73. This represents an improvement post revascularization.  ABI 08/20/2018: This exam reveals normal perfusion of the right lower extremity (ABI 1.00) AND mildly decreased perfusion of the left lower extremity, noted at the post tibial artery level (ABI 0.88). No significant change from 10/15/2017.  Carotid artery duplex  05/03/2019:  Doppler velocity suggests stenosis in the right internal carotid artery (16-49%).  Doppler velocity suggests stenosis in the left internal carotid artery (1-15%). Doppler velocity suggests stenosis in the left common carotid artery (<50%).  There is moderate to severe diffuse heterogeneous plaque especially in bilateral CCA and bulb.  Antegrade right vertebral artery flow. Antegrade left vertebral artery flow.  Follow up in one year is appropriate if clinically indicated.   EKG   EKG 01/27/2020: Sinus rhythm at a rate of 69 bpm with a single PVC, left atrial abnormality.  Normal axis.  Old inferior infarct.  Poor R wave progression, cannot exclude anterior septal infarct old.  Nonspecific T wave abnormality.  Compared to EKG 5//2021, left atrial abnormality new.  Assessment     ICD-10-CM   1. Atherosclerosis of native coronary artery of native heart without angina pectoris  I25.10 EKG 12-Lead  2. PAD (peripheral artery disease) (HCC)  I73.9 AMB referral to pulmonary rehabilitation  3. Dyspnea on exertion  R06.00 AMB referral to  pulmonary rehabilitation  4. Essential hypertension  I10 chlorthalidone (HYGROTON) 25 MG tablet    Basic metabolic panel    Basic metabolic panel    valsartan (DIOVAN) 320 MG tablet    DISCONTINUED: valsartan (DIOVAN) 320 MG tablet  5. Controlled type 2 diabetes mellitus with diabetic polyneuropathy, with long-term current use of insulin (HCC)  E11.42    Z79.4   6. Leg edema  R60.0     Meds ordered this encounter  Medications  . chlorthalidone (HYGROTON) 25 MG tablet    Sig: Take 1 tablet (25 mg total) by mouth daily.    Dispense:  90 tablet    Refill:  3  . DISCONTD: valsartan (DIOVAN) 320 MG tablet    Sig: Take 0.5 tablets (160 mg total) by mouth daily.    Dispense:  90 tablet    Refill:  3  . valsartan (DIOVAN) 320 MG tablet    Sig: Take 1 tablet (320 mg total) by mouth daily.    Dispense:  90 tablet    Refill:  3    Medications Discontinued During This Encounter  Medication Reason  . spironolactone (ALDACTONE) 25 MG tablet Discontinued by provider  . valsartan-hydrochlorothiazide (DIOVAN-HCT) 320-12.5 MG tablet Change in therapy  . valsartan (DIOVAN) 320 MG tablet     Recommendations:   Drew Harrison  is a 69 y.o. 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  Patient continues to have stable symptoms of claudication as well as dyspnea on exertion.  He is tolerating Plavix well without bleeding diathesis, will continue this.  I have reviewed external labs, lipids are well controlled.  We will continue atorvastatin and Zetia.  Diabetes is also well controlled, will defer further management to primary care.  Since patient's last visit his PCP stopped spironolactone due to elevated creatinine.  However his lower leg edema, particularly on the left  side, has worsened since stopping this.  Will stop valsartan/HCT and start him on chlorthalidone 25 mg daily as well as continue the valsartan 320 mg daily.  Will check BMP in 2 weeks. Blood pressure was mildly elevated in the office today, however patient reported home readings are well controlled. Encouraged him to continue to monitor BP and keep a log on a daily basis.  Will also continue patient's amlodipine, Plavix, and metoprolol titrate.  Patient does admit that he has not been as compliant with diet and exercise as of late.  Encouraged him to continue to focus on weight loss and lifestyle modifications.  In view of chronic dyspnea on exertion believe patient would benefit from pulmonary rehab at this time.  We will send referral to pulmonary rehab at [facility].    We will repeat carotid duplex in February 2022.  Follow-up in the office in 6 months for coronary artery disease, PAD, and carotid artery stenosis.  Patient was seen in collaboration with Dr. Einar Gip. He also reviewed patient's chart and Dr. Einar Gip is in agreement of the plan.    Alethia Berthold, PA-C 01/27/2020, 12:05 PM Office: [phone]

## 2020-02-02 DIAGNOSIS — I10 Essential (primary) hypertension: Secondary | ICD-10-CM | POA: Diagnosis not present

## 2020-02-03 LAB — BASIC METABOLIC PANEL
BUN/Creatinine Ratio: 13 (ref 10–24)
BUN: 18 mg/dL (ref 8–27)
CO2: 24 mmol/L (ref 20–29)
Calcium: 9.7 mg/dL (ref 8.6–10.2)
Chloride: 100 mmol/L (ref 96–106)
Creatinine, Ser: 1.35 mg/dL — ABNORMAL HIGH (ref 0.76–1.27)
GFR calc Af Amer: 61 mL/min/{1.73_m2} (ref >59)
GFR calc non Af Amer: 53 mL/min/{1.73_m2} — ABNORMAL LOW (ref >59)
Glucose: 160 mg/dL — ABNORMAL HIGH (ref 65–99)
Potassium: 4.6 mmol/L (ref 3.5–5.2)
Sodium: 138 mmol/L (ref 134–144)

## 2020-02-03 NOTE — Progress Notes (Signed)
Please inform patient kidney function has improved compared to 12/08/19. He can continue medications as prescribed during last office visit.

## 2020-02-03 NOTE — Progress Notes (Signed)
Spoke to patient he is aware

## 2020-02-28 DIAGNOSIS — N179 Acute kidney failure, unspecified: Secondary | ICD-10-CM | POA: Diagnosis not present

## 2020-02-28 DIAGNOSIS — N5201 Erectile dysfunction due to arterial insufficiency: Secondary | ICD-10-CM | POA: Diagnosis not present

## 2020-02-28 DIAGNOSIS — N401 Enlarged prostate with lower urinary tract symptoms: Secondary | ICD-10-CM | POA: Diagnosis not present

## 2020-02-28 DIAGNOSIS — R351 Nocturia: Secondary | ICD-10-CM | POA: Diagnosis not present

## 2020-02-29 DIAGNOSIS — L304 Erythema intertrigo: Secondary | ICD-10-CM | POA: Diagnosis not present

## 2020-03-30 DIAGNOSIS — L308 Other specified dermatitis: Secondary | ICD-10-CM | POA: Diagnosis not present

## 2020-03-30 DIAGNOSIS — L578 Other skin changes due to chronic exposure to nonionizing radiation: Secondary | ICD-10-CM | POA: Diagnosis not present

## 2020-03-30 DIAGNOSIS — D229 Melanocytic nevi, unspecified: Secondary | ICD-10-CM | POA: Diagnosis not present

## 2020-03-30 DIAGNOSIS — L821 Other seborrheic keratosis: Secondary | ICD-10-CM | POA: Diagnosis not present

## 2020-03-30 DIAGNOSIS — L814 Other melanin hyperpigmentation: Secondary | ICD-10-CM | POA: Diagnosis not present

## 2020-03-30 DIAGNOSIS — L738 Other specified follicular disorders: Secondary | ICD-10-CM | POA: Diagnosis not present

## 2020-04-26 ENCOUNTER — Ambulatory Visit: Payer: PPO

## 2020-04-26 ENCOUNTER — Other Ambulatory Visit: Payer: Self-pay

## 2020-04-26 DIAGNOSIS — I6523 Occlusion and stenosis of bilateral carotid arteries: Secondary | ICD-10-CM

## 2020-05-12 ENCOUNTER — Other Ambulatory Visit: Payer: Self-pay | Admitting: Cardiology

## 2020-05-16 DIAGNOSIS — E782 Mixed hyperlipidemia: Secondary | ICD-10-CM | POA: Diagnosis not present

## 2020-05-16 DIAGNOSIS — E119 Type 2 diabetes mellitus without complications: Secondary | ICD-10-CM | POA: Diagnosis not present

## 2020-05-16 DIAGNOSIS — I10 Essential (primary) hypertension: Secondary | ICD-10-CM | POA: Diagnosis not present

## 2020-05-16 DIAGNOSIS — Z125 Encounter for screening for malignant neoplasm of prostate: Secondary | ICD-10-CM | POA: Diagnosis not present

## 2020-05-19 DIAGNOSIS — I10 Essential (primary) hypertension: Secondary | ICD-10-CM | POA: Diagnosis not present

## 2020-05-19 DIAGNOSIS — N401 Enlarged prostate with lower urinary tract symptoms: Secondary | ICD-10-CM | POA: Diagnosis not present

## 2020-05-19 DIAGNOSIS — I700 Atherosclerosis of aorta: Secondary | ICD-10-CM | POA: Diagnosis not present

## 2020-05-19 DIAGNOSIS — H60391 Other infective otitis externa, right ear: Secondary | ICD-10-CM | POA: Diagnosis not present

## 2020-05-19 DIAGNOSIS — E782 Mixed hyperlipidemia: Secondary | ICD-10-CM | POA: Diagnosis not present

## 2020-05-19 DIAGNOSIS — E1151 Type 2 diabetes mellitus with diabetic peripheral angiopathy without gangrene: Secondary | ICD-10-CM | POA: Diagnosis not present

## 2020-05-19 DIAGNOSIS — H66004 Acute suppurative otitis media without spontaneous rupture of ear drum, recurrent, right ear: Secondary | ICD-10-CM | POA: Diagnosis not present

## 2020-05-19 DIAGNOSIS — L409 Psoriasis, unspecified: Secondary | ICD-10-CM | POA: Diagnosis not present

## 2020-05-19 DIAGNOSIS — R351 Nocturia: Secondary | ICD-10-CM | POA: Diagnosis not present

## 2020-05-19 DIAGNOSIS — I639 Cerebral infarction, unspecified: Secondary | ICD-10-CM | POA: Diagnosis not present

## 2020-05-19 DIAGNOSIS — M1A9XX0 Chronic gout, unspecified, without tophus (tophi): Secondary | ICD-10-CM | POA: Diagnosis not present

## 2020-05-19 DIAGNOSIS — K219 Gastro-esophageal reflux disease without esophagitis: Secondary | ICD-10-CM | POA: Diagnosis not present

## 2020-05-30 DIAGNOSIS — E119 Type 2 diabetes mellitus without complications: Secondary | ICD-10-CM | POA: Diagnosis not present

## 2020-05-31 DIAGNOSIS — L308 Other specified dermatitis: Secondary | ICD-10-CM | POA: Diagnosis not present

## 2020-05-31 DIAGNOSIS — L309 Dermatitis, unspecified: Secondary | ICD-10-CM | POA: Diagnosis not present

## 2020-06-01 DIAGNOSIS — R351 Nocturia: Secondary | ICD-10-CM | POA: Diagnosis not present

## 2020-06-01 DIAGNOSIS — N5201 Erectile dysfunction due to arterial insufficiency: Secondary | ICD-10-CM | POA: Diagnosis not present

## 2020-06-01 DIAGNOSIS — N401 Enlarged prostate with lower urinary tract symptoms: Secondary | ICD-10-CM | POA: Diagnosis not present

## 2020-06-21 DIAGNOSIS — L309 Dermatitis, unspecified: Secondary | ICD-10-CM | POA: Diagnosis not present

## 2020-06-21 DIAGNOSIS — H66004 Acute suppurative otitis media without spontaneous rupture of ear drum, recurrent, right ear: Secondary | ICD-10-CM | POA: Diagnosis not present

## 2020-06-21 DIAGNOSIS — H7291 Unspecified perforation of tympanic membrane, right ear: Secondary | ICD-10-CM | POA: Diagnosis not present

## 2020-06-21 DIAGNOSIS — H6091 Unspecified otitis externa, right ear: Secondary | ICD-10-CM | POA: Diagnosis not present

## 2020-07-24 DIAGNOSIS — H90A11 Conductive hearing loss, unilateral, right ear with restricted hearing on the contralateral side: Secondary | ICD-10-CM | POA: Diagnosis not present

## 2020-07-24 DIAGNOSIS — H7291 Unspecified perforation of tympanic membrane, right ear: Secondary | ICD-10-CM | POA: Diagnosis not present

## 2020-07-24 DIAGNOSIS — H66004 Acute suppurative otitis media without spontaneous rupture of ear drum, recurrent, right ear: Secondary | ICD-10-CM | POA: Diagnosis not present

## 2020-07-27 ENCOUNTER — Ambulatory Visit: Payer: PPO | Admitting: Cardiology

## 2020-08-08 ENCOUNTER — Other Ambulatory Visit: Payer: Self-pay

## 2020-08-08 ENCOUNTER — Ambulatory Visit: Payer: PPO | Admitting: Cardiology

## 2020-08-08 ENCOUNTER — Encounter: Payer: Self-pay | Admitting: Cardiology

## 2020-08-08 VITALS — BP 130/66 | HR 74 | Temp 98.2°F | Resp 17 | Ht 72.0 in | Wt 218.0 lb

## 2020-08-08 DIAGNOSIS — I2510 Atherosclerotic heart disease of native coronary artery without angina pectoris: Secondary | ICD-10-CM | POA: Diagnosis not present

## 2020-08-08 DIAGNOSIS — I739 Peripheral vascular disease, unspecified: Secondary | ICD-10-CM | POA: Diagnosis not present

## 2020-08-08 DIAGNOSIS — I10 Essential (primary) hypertension: Secondary | ICD-10-CM | POA: Diagnosis not present

## 2020-08-08 DIAGNOSIS — I6523 Occlusion and stenosis of bilateral carotid arteries: Secondary | ICD-10-CM

## 2020-08-08 MED ORDER — VALSARTAN 160 MG PO TABS: 160.0000 mg | ORAL_TABLET | Freq: Every day | ORAL | 3 refills | Status: DC

## 2020-08-08 NOTE — Progress Notes (Signed)
Primary Physician/Referring:  Nickola Major, MD  Patient ID: Drew Harrison, male    DOB: 09-14-1950, 70 y.o.   MRN: [id number]  Chief Complaint  Patient presents with  . Follow-up  . PAD  . Coronary Artery Disease  . carotid stenosis    6 month   HPI:    Drew Harrison  is a 70 y.o. Caucasian male with coronary artery disease s/p CABGX4 (05/2016), post op CVA with no significant neurodeficit, hypertension, type 2 DM with vascular complications, bilateral modorate carotid stenosis, severe LE PAD. He has had left popliteal and distal TP trunk angioplasty on 08/26/2017 and also right TP trunk and right  peroneal artery angioplasty on 09/23/2017.  ABI performed in May 2020 was unchanged.   Patient presents for 26-monthfollow-up of PAD, CAD, and carotid artery stenosis.  At last visit-switch patient from valsartan/hydrochlorothiazide to valsartan 160 mg daily and chlorthalidone 25 mg daily.  Also referred patient to pulmonary rehab, which he did not complete.  Repeat BMP revealed renal function has remained stable.  Patient continues to have symptoms of claudication primarily located in his thighs, which he feels may be worsening.  Home blood pressure readings are reportedly well controlled.  He has not needed sublingual nitroglycerin since last visit.  Denies chest pain, palpitations, orthopnea, PND, dyspnea.  Notably in the past patient has had to discontinue spironolactone due to worsening renal function.  Past Medical History:  Diagnosis Date  . Abnormal nuclear stress test   . Arthritis   . Cellulitis and abscess of hand 05/2017  . Coronary artery disease    3-vessel  . [place] mellitus [place] complication [place]   . GERD (gastroesophageal reflux disease)   . Gout   . Headache   . History of tobacco abuse   . Hyperlipidemia   . Hypertension   . [place]schemic stroke ([facility]   . [place] ([place] 03[place]2016   left facial droop  . [place]ism ([place]   . Visual disturbance     Past Surgical History:  Procedure Laterality Date  . CORONARY ARTERY BYPASS GRAFT N/A 06/17/2016   Procedure: CORONARY ARTERY BYPASS GRAFTING (CABG) x four using left internal mammary artery and right greater saphenous leg vein using endoscope.;  Surgeon: PIvin Poot MD;  [facility]  Service: Open Heart Surgery;  Laterality: N/A;  . LEFT HEART CATH AND CORONARY ANGIOGRAPHY N/A 05/14/2016   Procedure: Left Heart Cath and Coronary Angiography;  Surgeon: JAdrian Prows MD;  [facility];  Service: Cardiovascular;  Laterality: N/A;  . LOWER EXTREMITY ANGIOGRAPHY N/A 01/21/2017   Procedure: Lower Extremity Angiography;  Surgeon: GAdrian Prows MD;  [facility];  Service: Cardiovascular;  Laterality: N/A;  . LOWER EXTREMITY ANGIOGRAPHY Left 08/26/2017   Procedure: LOWER EXTREMITY ANGIOGRAPHY;  Surgeon: GAdrian Prows MD;  [facility];  Service: Cardiovascular;  Laterality: Left;  . LOWER EXTREMITY ANGIOGRAPHY Right 09/23/2017   Procedure: LOWER EXTREMITY ANGIOGRAPHY;  Surgeon: PNigel Mormon MD;  [facility];  Service: Cardiovascular;  Laterality: Right;  . PERIPHERAL VASCULAR ATHERECTOMY  08/26/2017   Procedure: PERIPHERAL VASCULAR ATHERECTOMY;  Surgeon: GAdrian Prows MD;  [facility];  Service: Cardiovascular;;  . PERIPHERAL VASCULAR ATHERECTOMY  09/23/2017   Procedure: PERIPHERAL VASCULAR ATHERECTOMY;  Surgeon: PNigel Mormon MD;  [facility];  Service: Cardiovascular;;  . PERIPHERAL VASCULAR BALLOON ANGIOPLASTY  08/26/2017   Procedure: PERIPHERAL VASCULAR  BALLOON ANGIOPLASTY;  Surgeon: Adrian Prows, MD;  Location: [place] [facility];  Service: Cardiovascular;;  . PERIPHERAL VASCULAR BALLOON ANGIOPLASTY  09/23/2017   Procedure: PERIPHERAL VASCULAR BALLOON ANGIOPLASTY;  Surgeon: Nigel Mormon, MD;  Location: [facility];  Service: Cardiovascular;;  . TEE WITHOUT CARDIOVERSION N/A  06/17/2016   Procedure: TRANSESOPHAGEAL ECHOCARDIOGRAM (TEE);  Surgeon: [name], MD;  Location: [facility];  Service: Open Heart Surgery;  Laterality: N/A;  . TONSILLECTOMY     Family History  Problem Relation Age of Onset  . Heart disease Father   . Pancreatic cancer Brother   . Colon cancer Neg Hx   . Esophageal cancer Neg Hx   . Rectal cancer Neg Hx     Social History   Tobacco Use  . Smoking status: Former Smoker    Packs/day: 1.50    Years: 48.00    Pack years: 72.00    Types: Cigarettes    Quit date: 03/29/2016    Years since quitting: 4.3  . Smokeless tobacco: Never Used  Substance Use Topics  . Alcohol use: Yes    Alcohol/week: 14.0 standard drinks    Types: 14 Cans of beer per week    Comment: occas  Marital Status: Divorced   ROS  Review of Systems  Constitutional: Negative for malaise/fatigue.  Cardiovascular: Positive for claudication (stable) and leg swelling. Negative for chest pain, dyspnea on exertion, near-syncope, orthopnea, palpitations, paroxysmal nocturnal dyspnea and syncope.  Hematologic/Lymphatic: Does not bruise/bleed easily.  Gastrointestinal: Negative for melena.  Neurological: Negative for dizziness and weakness.   Objective  Blood pressure 130/66, pulse 74, temperature 98.2 F (36.8 C), temperature source Temporal, resp. rate 17, height 6' (1.829 m), weight 218 lb (98.9 kg), SpO2 98 %.  Vitals with BMI 08/08/2020 08/08/2020 01/27/2020  Height - _0  _1   Weight - 218 lbs 221 lbs  BMI - 30.16 01.09  Systolic 323 557 322  Diastolic 66 69 70  Pulse 74 76 76     Physical Exam Constitutional:      General: He is not in acute distress.    Appearance: He is well-developed.  Cardiovascular:     Rate and Rhythm: Normal rate and regular rhythm.     Pulses: Intact distal pulses.          Carotid pulses are on the right side with bruit.      Femoral pulses are 2+ on the right side and 2+ on the left side.      Popliteal pulses are 1+ on the  right side and 2+ on the left side.       Dorsalis pedis pulses are 0 on the right side and 0 on the left side.       Posterior tibial pulses are 2+ on the right side and 2+ on the left side.     Heart sounds: No murmur heard. No gallop.      Comments: 1 + pitting edema left lower leg. Trace edema right lower leg. Bilateral cool toes, Bilateral chronic venostasis pigmentation. No JVD.  Pulmonary:     Effort: Pulmonary effort is normal. No accessory muscle usage or respiratory distress.     Breath sounds: Normal breath sounds.  Neurological:     General: No focal deficit present.    Laboratory examination:   Recent Labs    02/02/20 0951  NA 138  K 4.6  CL 100  CO2 24  GLUCOSE 160*  BUN 18  CREATININE  1.35*  CALCIUM 9.7  GFRNONAA 53*  GFRAA 61   CrCl cannot be calculated (Patient's most recent lab result is older than the maximum 21 days allowed.).  CMP Latest Ref Rng & Units 02/02/2020 06/17/2017 07/01/2016  Glucose 65 - 99 mg/dL 160(H) 118(H) -  BUN 8 - 27 mg/dL _0 Creatinine 0.76 - 1.27 mg/dL 1.35(H) 0.98 0.9  Sodium 134 - 144 mmol/L 138 140 141  Potassium 3.5 - 5.2 mmol/L 4.6 3.9 4.2  Chloride 96 - 106 mmol/L 100 106 -  CO2 20 - 29 mmol/L 24 23 -  Calcium 8.6 - 10.2 mg/dL 9.7 9.5 -  Total Protein 6.5 - 8.1 g/dL - - -  Total Bilirubin 0.3 - 1.2 mg/dL - - -  Alkaline Phos 38 - 126 U/L - - -  AST 15 - 41 U/L - - -  ALT 17 - 63 U/L - - -   CBC Latest Ref Rng & Units 06/17/2017 07/01/2016 06/22/2016  WBC 4.0 - 10.5 K/uL 7.5 6.9 6.5  Hemoglobin 13.0 - 17.0 g/dL 13.3 8.0(A) 8.9(L)  Hematocrit 39.0 - 52.0 % 39.5 25(A) 27.2(L)  Platelets 150 - 400 K/uL 171 280 155   Lipid Panel     Component Value Date/Time   CHOL 108 03/09/2019 0818   TRIG 162 (H) 03/09/2019 0818   HDL 36 (L) 03/09/2019 0818   CHOLHDL 4.1 01/18/2019 0806   CHOLHDL 4.0 06/22/2016 0234   VLDL 28 06/22/2016 0234   LDLCALC 45 03/09/2019 0818   HEMOGLOBIN A1C Lab Results  Component Value Date    HGBA1C 12.2 (H) 06/22/2016   MPG 303 06/22/2016   TSH No results for input(s): TSH in the last 8760 hours.  External labs:  12/08/2019: Sodium 130, potassium 5.2, BUN 26, creatinine 6.2, EGFR 43 Magnesium 1.8  11/12/2019  A1c 6.0% Sodium 131, BUN 29, potassium 5.3, creatinine 1.52, EGFR 46 LDL 62, total cholesterol 126, triglycerides 120, HDL 41, non-HDL 85 Hemoglobin 13.9, hematocrit 40.2, platelets 164  05/25/2019: WBC 8, RBC 4.28, hemoglobin 13, hematocrit 38, MCV 89 platelets 165. Glucose 143. BUN/ creatinine 25/ 1.24, Sodium 135, Potassium 4.8, ALK phosphatase 105, ALT 32, AST 20, Total bilirubin 0.8, Total protein 6.7. Cholesterol 112, triglycerides 124, HDL 33, LDL 55 HB A1C  6.4. PSA 1.63.  PCP labs 05/12/2018: Cholesterol 145, triglycerides 136, HDL 46, LDL 86.  Potassium 4.8, creatinine 1.29, glucose 162, EGFR 57, CMP otherwise normal.  Iron panel normal.  Hemoglobin A1c 6.5%.  RBC 4.37, hemoglobin 13.1, hematocrit 39.1, CBC otherwise normal.   Medications and allergies  No Known Allergies   Current Outpatient Medications on File Prior to Visit  Medication Sig Dispense Refill  . allopurinol (ZYLOPRIM) 100 MG tablet Take 200 mg by mouth daily.   0  . amLODipine (NORVASC) 10 MG tablet Take 10 mg by mouth daily.    Marland Kitchen atorvastatin (LIPITOR) 80 MG tablet TAKE 1 TABLET(80 MG) BY MOUTH DAILY 90 tablet 3  . BD PEN NEEDLE NANO 2ND GEN 32G X 4 MM MISC USE TWICE DAILY WITH LEVEMIR    . chlorthalidone (HYGROTON) 25 MG tablet Take 1 tablet (25 mg total) by mouth daily. 90 tablet 3  . clopidogrel (PLAVIX) 75 MG tablet Take 75 mg by mouth daily.    . clotrimazole-betamethasone (LOTRISONE) lotion Apply topically 2 (two) times daily.    Marland Kitchen ezetimibe (ZETIA) 10 MG tablet TAKE 1 TABLET(10 MG) BY MOUTH DAILY 30 tablet 6  . famotidine (PEPCID) 40 MG  tablet Take 40 mg by mouth 2 (two) times daily.    . insulin detemir (LEVEMIR) 100 UNIT/ML injection Inject 0.4 mLs (40 Units total) into the skin  at bedtime. (Patient taking differently: Inject 44 Units into the skin at bedtime.)    . metFORMIN (GLUCOPHAGE) 500 MG tablet Take 1 tablet (500 mg total) by mouth 2 (two) times daily with a meal. 60 tablet 0  . metoprolol tartrate (LOPRESSOR) 25 MG tablet Take 1 tablet (25 mg total) by mouth 2 (two) times daily.    Marland Kitchen MYRBETRIQ 50 MG TB24 tablet Take 50 mg by mouth daily.    . sildenafil (REVATIO) 20 MG tablet Take 1 tablet by mouth as needed.     No current facility-administered medications on file prior to visit.    Radiology:   CT chest without contrast 07/01/2019: 1. Lung-RADS 2, benign appearance or behavior. Continue annual screening with low-dose chest CT without contrast in 12 months. 2. Hepatic steatosis. 3. Aortic Atherosclerosis and Emphysema. 4. Bilateral gynecomastia.  Cardiac Studies:   Echocardiogram 03/30/2016: - Left ventricle: The cavity size was normal. Wall thickness was  normal. Systolic function was normal. The estimated ejection  fraction was in the range of 50% to 55%. Probable severe  hypokinesis and scarring of the basalinferolateral and inferior  myocardium; in the distribution of the right coronary or left  circumflex coronary artery. Features are consistent with a  pseudonormal left ventricular filling pattern, with concomitant  abnormal relaxation and increased filling pressure (grade 2  diastolic dysfunction). - Mitral valve: There was mild to moderate regurgitation. - Left atrium: The atrium was mildly dilated.  Coronary angiogram 05/13/2016: Normal LVEF. RCA ocluded. LAD 3 tandem prox to mid aneurysmal dilatation with 90% stenosis in the mid LAD. Smooth after stenosis. D1 prox 80%. Cx Occluded mid. RI-2 prox to mid 80%.  Peripheral arteriogram 08/26/2017: Bilateral iliac vessels reveal 30-40% diffuse luminal irregularity. Left TP trunk high-grade 99% stenosis S/P HawkOne directional atherectomy followed I 4.0 x 80 mm InPact Admiral DCB to 0% 2 vessel  runoff.  Peripheral angiogram 09/23/2017: Severe Rt TP trunk, Rt PTA and Rt peroneal artery stenosis. Near total occlusion mid Rt ATA with collaterals seen distally. Three vessel below the knee run off with slow flow Successful orbital atherectomy with Diamondback 360 1.25 solid micro crown to Rt TP trunk, Rt PTA and Rt peroneal artery, followed by PTA with 3.0 X 60 mm balloon. 0% residual stenosis in Rt PTA, Rt peroneal. 10% residual stenosis in Rt TP trunk. Recommend continued medical management and PTA to Rt ATA if claudication symptoms persist.  ABI 10/15/2017: This exam reveals mildly decreased perfusion of the right lower extremity, noted at the post tibial artery level (ABI 0.90) with biphasic waveform and mildly decreased perfusion of the left lower extremity, noted at the post tibial artery level (ABI 0.90) with monophasic waveform in AT and biphasic waveform in PT. Compared to the study 08/15/2017, right ABI 0.82 and left ABI 0.73. This represents an improvement post revascularization.  ABI 08/20/2018: This exam reveals normal perfusion of the right lower extremity (ABI 1.00) AND mildly decreased perfusion of the left lower extremity, noted at the post tibial artery level (ABI 0.88). No significant change from 10/15/2017.  Carotid artery duplex 04/26/2020: Stenosis in the right internal carotid artery (1-15%). Stenosis in the right external carotid artery (<50%). Stenosis in the left internal carotid artery (1-15%). Stenosis in the left external carotid artery (<50%). Antegrade right vertebral artery flow. Antegrade left  vertebral artery flow. Compared to 05/03/2019, right ICA stenosis of <50% now minimal. Follow up studies is appropriate if clinically indicated.  EKG   EKG 08/08/2020: Sinus rhythm at a rate of 72 bpm.  Left atrial abnormality.  Normal axis.  Inferior infarct old.  Poor R wave progression, cannot exclude anteroseptal infarct old.  Nonspecific T wave abnormality.   Compared to EKG 01/27/2020, no significant change.  EKG 01/27/2020: Sinus rhythm at a rate of 69 bpm with a single PVC, left atrial abnormality.  Normal axis.  Old inferior infarct.  Poor R wave progression, cannot exclude anterior septal infarct old.  Nonspecific T wave abnormality.  Compared to EKG 5//2021, left atrial abnormality new.  Assessment     ICD-10-CM   1. Atherosclerosis of native coronary artery of native heart without angina pectoris  I25.10   2. PAD (peripheral artery disease) (HCC)  I73.9 PCV ANKLE BRACHIAL INDEX (ABI)  3. Essential hypertension  I10 EKG 12-Lead    valsartan (DIOVAN) 160 MG tablet  4. Bilateral carotid artery stenosis  I65.23     Meds ordered this encounter  Medications  . valsartan (DIOVAN) 160 MG tablet    Sig: Take 1 tablet (160 mg total) by mouth daily.    Dispense:  90 tablet    Refill:  3    Medications Discontinued During This Encounter  Medication Reason  . valsartan (DIOVAN) 320 MG tablet     Recommendations:   Drew Harrison  is a 70 y.o. 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  Patient is doing well overall, however he reports he feels symptoms of claudication may be mildly worse than last visit. Will therefore obtain repeat ABI at this time. He is tolerating present medications without issue.  Lipids are well controlled.  Blood pressure is well controlled.  Patient continues to admit to poor compliance with diet and exercise, however appears motivated to make changes.  Diabetes also remains well controlled.  Again discuss with patient regarding importance of diet and lifestyle modifications.  In regard to carotid artery stenosis, he has minimal disease which has remained stable, therefore Dr. Einar Gip has  recommended no further surveillance, patient verbalized understanding agreement with this plan.  Follow-up in 6 months, sooner if needed, for PAD, CAD.    Alethia Berthold, PA-C 08/09/2020, 9:58 AM Office: [phone]

## 2020-08-16 ENCOUNTER — Ambulatory Visit: Payer: PPO

## 2020-08-16 ENCOUNTER — Other Ambulatory Visit: Payer: Self-pay

## 2020-08-16 DIAGNOSIS — I739 Peripheral vascular disease, unspecified: Secondary | ICD-10-CM

## 2020-08-22 ENCOUNTER — Other Ambulatory Visit: Payer: Self-pay | Admitting: Student

## 2020-08-22 MED ORDER — ASPIRIN EC 81 MG PO TBEC: 81.0000 mg | DELAYED_RELEASE_TABLET | Freq: Every day | ORAL | 2 refills | Status: AC

## 2020-08-22 NOTE — Progress Notes (Signed)
Given progression of PAD and claudication symptoms advised patient to start ASA 81 mg daily in conjunction with Plavix.

## 2020-09-05 DIAGNOSIS — N179 Acute kidney failure, unspecified: Secondary | ICD-10-CM | POA: Diagnosis not present

## 2020-09-05 DIAGNOSIS — H90A11 Conductive hearing loss, unilateral, right ear with restricted hearing on the contralateral side: Secondary | ICD-10-CM | POA: Diagnosis not present

## 2020-09-05 DIAGNOSIS — H9211 Otorrhea, right ear: Secondary | ICD-10-CM | POA: Diagnosis not present

## 2020-09-05 DIAGNOSIS — H7291 Unspecified perforation of tympanic membrane, right ear: Secondary | ICD-10-CM | POA: Diagnosis not present

## 2020-09-06 DIAGNOSIS — L03012 Cellulitis of left finger: Secondary | ICD-10-CM | POA: Diagnosis not present

## 2020-09-26 DIAGNOSIS — H7291 Unspecified perforation of tympanic membrane, right ear: Secondary | ICD-10-CM | POA: Diagnosis not present

## 2020-10-05 DIAGNOSIS — D692 Other nonthrombocytopenic purpura: Secondary | ICD-10-CM | POA: Diagnosis not present

## 2020-10-05 DIAGNOSIS — D229 Melanocytic nevi, unspecified: Secondary | ICD-10-CM | POA: Diagnosis not present

## 2020-10-05 DIAGNOSIS — L578 Other skin changes due to chronic exposure to nonionizing radiation: Secondary | ICD-10-CM | POA: Diagnosis not present

## 2020-10-05 DIAGNOSIS — L821 Other seborrheic keratosis: Secondary | ICD-10-CM | POA: Diagnosis not present

## 2020-10-05 DIAGNOSIS — L738 Other specified follicular disorders: Secondary | ICD-10-CM | POA: Diagnosis not present

## 2020-10-06 ENCOUNTER — Other Ambulatory Visit: Payer: Self-pay | Admitting: Family Medicine

## 2020-10-06 ENCOUNTER — Other Ambulatory Visit (HOSPITAL_COMMUNITY): Payer: Self-pay | Admitting: Family Medicine

## 2020-10-06 DIAGNOSIS — Z87891 Personal history of nicotine dependence: Secondary | ICD-10-CM

## 2020-10-24 ENCOUNTER — Ambulatory Visit
Admission: RE | Admit: 2020-10-24 | Discharge: 2020-10-24 | Disposition: A | Discharge status: Home / Self Care | Payer: PPO | Source: Ambulatory Visit | Attending: Family Medicine | Admitting: Family Medicine

## 2020-10-24 ENCOUNTER — Other Ambulatory Visit: Payer: Self-pay

## 2020-10-24 DIAGNOSIS — Z87891 Personal history of nicotine dependence: Secondary | ICD-10-CM | POA: Diagnosis not present

## 2020-10-27 ENCOUNTER — Ambulatory Visit (HOSPITAL_COMMUNITY): Payer: PPO

## 2020-11-14 DIAGNOSIS — Z0000 Encounter for general adult medical examination without abnormal findings: Secondary | ICD-10-CM | POA: Diagnosis not present

## 2020-11-14 DIAGNOSIS — Z125 Encounter for screening for malignant neoplasm of prostate: Secondary | ICD-10-CM | POA: Diagnosis not present

## 2020-11-14 DIAGNOSIS — I739 Peripheral vascular disease, unspecified: Secondary | ICD-10-CM | POA: Diagnosis not present

## 2020-11-14 DIAGNOSIS — I700 Atherosclerosis of aorta: Secondary | ICD-10-CM | POA: Diagnosis not present

## 2020-11-14 DIAGNOSIS — H60331 Swimmer's ear, right ear: Secondary | ICD-10-CM | POA: Diagnosis not present

## 2020-11-14 DIAGNOSIS — I639 Cerebral infarction, unspecified: Secondary | ICD-10-CM | POA: Diagnosis not present

## 2020-11-14 DIAGNOSIS — Z7902 Long term (current) use of antithrombotics/antiplatelets: Secondary | ICD-10-CM | POA: Diagnosis not present

## 2020-11-14 DIAGNOSIS — I10 Essential (primary) hypertension: Secondary | ICD-10-CM | POA: Diagnosis not present

## 2020-11-14 DIAGNOSIS — R252 Cramp and spasm: Secondary | ICD-10-CM | POA: Diagnosis not present

## 2020-11-14 DIAGNOSIS — N179 Acute kidney failure, unspecified: Secondary | ICD-10-CM | POA: Diagnosis not present

## 2020-11-14 DIAGNOSIS — K219 Gastro-esophageal reflux disease without esophagitis: Secondary | ICD-10-CM | POA: Diagnosis not present

## 2020-11-14 DIAGNOSIS — E782 Mixed hyperlipidemia: Secondary | ICD-10-CM | POA: Diagnosis not present

## 2020-11-14 DIAGNOSIS — E119 Type 2 diabetes mellitus without complications: Secondary | ICD-10-CM | POA: Diagnosis not present

## 2020-11-14 DIAGNOSIS — M109 Gout, unspecified: Secondary | ICD-10-CM | POA: Diagnosis not present

## 2020-11-14 DIAGNOSIS — N400 Enlarged prostate without lower urinary tract symptoms: Secondary | ICD-10-CM | POA: Diagnosis not present

## 2020-11-17 DIAGNOSIS — Z7984 Long term (current) use of oral hypoglycemic drugs: Secondary | ICD-10-CM | POA: Diagnosis not present

## 2020-11-17 DIAGNOSIS — Z0000 Encounter for general adult medical examination without abnormal findings: Secondary | ICD-10-CM | POA: Diagnosis not present

## 2020-11-17 DIAGNOSIS — Z794 Long term (current) use of insulin: Secondary | ICD-10-CM | POA: Diagnosis not present

## 2020-11-17 DIAGNOSIS — I10 Essential (primary) hypertension: Secondary | ICD-10-CM | POA: Diagnosis not present

## 2020-11-17 DIAGNOSIS — E782 Mixed hyperlipidemia: Secondary | ICD-10-CM | POA: Diagnosis not present

## 2020-11-17 DIAGNOSIS — I6930 Unspecified sequelae of cerebral infarction: Secondary | ICD-10-CM | POA: Diagnosis not present

## 2020-11-17 DIAGNOSIS — M1A9XX0 Chronic gout, unspecified, without tophus (tophi): Secondary | ICD-10-CM | POA: Diagnosis not present

## 2020-11-17 DIAGNOSIS — I700 Atherosclerosis of aorta: Secondary | ICD-10-CM | POA: Diagnosis not present

## 2020-11-17 DIAGNOSIS — E119 Type 2 diabetes mellitus without complications: Secondary | ICD-10-CM | POA: Diagnosis not present

## 2020-11-17 DIAGNOSIS — N179 Acute kidney failure, unspecified: Secondary | ICD-10-CM | POA: Diagnosis not present

## 2020-11-17 DIAGNOSIS — K219 Gastro-esophageal reflux disease without esophagitis: Secondary | ICD-10-CM | POA: Diagnosis not present

## 2020-11-17 DIAGNOSIS — I739 Peripheral vascular disease, unspecified: Secondary | ICD-10-CM | POA: Diagnosis not present

## 2020-12-21 DIAGNOSIS — N5201 Erectile dysfunction due to arterial insufficiency: Secondary | ICD-10-CM | POA: Diagnosis not present

## 2020-12-21 DIAGNOSIS — N401 Enlarged prostate with lower urinary tract symptoms: Secondary | ICD-10-CM | POA: Diagnosis not present

## 2020-12-21 DIAGNOSIS — R351 Nocturia: Secondary | ICD-10-CM | POA: Diagnosis not present

## 2021-01-17 ENCOUNTER — Other Ambulatory Visit: Payer: Self-pay | Admitting: Student

## 2021-01-17 DIAGNOSIS — I10 Essential (primary) hypertension: Secondary | ICD-10-CM

## 2021-01-30 ENCOUNTER — Other Ambulatory Visit: Payer: Self-pay | Admitting: Cardiology

## 2021-02-08 ENCOUNTER — Ambulatory Visit: Payer: PPO | Admitting: Cardiology

## 2021-02-14 ENCOUNTER — Ambulatory Visit: Payer: Self-pay | Admitting: Cardiology

## 2021-02-14 ENCOUNTER — Ambulatory Visit: Payer: PPO | Admitting: Cardiology

## 2021-02-14 ENCOUNTER — Other Ambulatory Visit: Payer: Self-pay

## 2021-02-14 ENCOUNTER — Encounter: Payer: Self-pay | Admitting: Cardiology

## 2021-02-14 VITALS — BP 147/75 | HR 72 | Temp 98.3°F | Resp 17 | Ht 72.0 in | Wt 217.0 lb

## 2021-02-14 DIAGNOSIS — I10 Essential (primary) hypertension: Secondary | ICD-10-CM

## 2021-02-14 DIAGNOSIS — I739 Peripheral vascular disease, unspecified: Secondary | ICD-10-CM | POA: Diagnosis not present

## 2021-02-14 DIAGNOSIS — I2510 Atherosclerotic heart disease of native coronary artery without angina pectoris: Secondary | ICD-10-CM

## 2021-02-14 DIAGNOSIS — E785 Hyperlipidemia, unspecified: Secondary | ICD-10-CM

## 2021-02-14 MED ORDER — METOPROLOL TARTRATE 50 MG PO TABS: 50.0000 mg | ORAL_TABLET | Freq: Two times a day (BID) | ORAL | 3 refills | Status: DC

## 2021-02-14 NOTE — Progress Notes (Signed)
Primary Physician/Referring:  Nickola Major, MD  Patient ID: Drew Harrison, male    DOB: 1950/12/18, 70 y.o.   MRN: [id number]  Chief Complaint  Patient presents with   PAD   Coronary Artery Disease   mild carotid stenosis   Follow-up    6 months   HPI:    Drew Harrison  is a 70 y.o. 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  Patient is doing well overall, however he reports he feels symptoms of claudication has improved and he has no endorgan exercise program, he also quit completely drinking alcohol for the past 6 weeks.He has had left popliteal and distal TP trunk angioplasty on 08/26/2017 and also right TP trunk and right  peroneal artery angioplasty on 09/23/2017.  ABI performed in May 2022 was unchanged.   Past Medical History:  Diagnosis Date   Abnormal nuclear stress test    Arthritis    Cellulitis and abscess of hand 05/2017   Coronary artery disease    3-vessel   Diabetes mellitus without complication (HCC)    GERD (gastroesophageal reflux disease)    Gout    Headache    History of tobacco abuse    Hyperlipidemia    Hypertension    Ischemic stroke ([place])    Stroke ([place]) 03/29/2016   left facial droop   Stroke due to embolism ([place])    Visual disturbance    Past Surgical History:  Procedure Laterality Date   CORONARY ARTERY BYPASS GRAFT N/A 06/17/2016   Procedure: CORONARY ARTERY BYPASS GRAFTING (CABG) x four using left internal mammary artery and right greater saphenous leg vein using endoscope.;  Surgeon: [name], MD;  Location: [facility];  Service: Open Heart Surgery;  Laterality: N/A;   LEFT HEART CATH AND CORONARY ANGIOGRAPHY N/A 05/14/2016   Procedure: Left Heart Cath and Coronary Angiography;  Surgeon: Adrian Prows, MD;  Location: [facility] [facility];   Service: Cardiovascular;  Laterality: N/A;   LOWER EXTREMITY ANGIOGRAPHY N/A 01/21/2017   Procedure: Lower Extremity Angiography;  Surgeon: Adrian Prows, MD;  Location: [facility];  Service: Cardiovascular;  Laterality: N/A;   LOWER EXTREMITY ANGIOGRAPHY Left 08/26/2017   Procedure: LOWER EXTREMITY ANGIOGRAPHY;  Surgeon: Adrian Prows, MD;  Location: [facility];  Service: Cardiovascular;  Laterality: Left;   LOWER EXTREMITY ANGIOGRAPHY Right 09/23/2017   Procedure: LOWER EXTREMITY ANGIOGRAPHY;  Surgeon: Nigel Mormon, MD;  Location: [facility];  Service: Cardiovascular;  Laterality: Right;   PERIPHERAL VASCULAR ATHERECTOMY  08/26/2017   Procedure: PERIPHERAL VASCULAR ATHERECTOMY;  Surgeon: Adrian Prows, MD;  Location: [facility];  Service: Cardiovascular;;   PERIPHERAL VASCULAR ATHERECTOMY  09/23/2017   Procedure: PERIPHERAL VASCULAR ATHERECTOMY;  Surgeon: Nigel Mormon, MD;  Location: [place] [facility];  Service: Cardiovascular;;   PERIPHERAL VASCULAR BALLOON ANGIOPLASTY  08/26/2017   Procedure: PERIPHERAL VASCULAR BALLOON ANGIOPLASTY;  Surgeon: Adrian Prows, MD;  Location: [facility];  Service: Cardiovascular;;   PERIPHERAL VASCULAR BALLOON ANGIOPLASTY  09/23/2017   Procedure: PERIPHERAL VASCULAR BALLOON ANGIOPLASTY;  Surgeon: Nigel Mormon, MD;  Location: [facility];  Service: Cardiovascular;;   TEE WITHOUT CARDIOVERSION N/A 06/17/2016   Procedure: TRANSESOPHAGEAL ECHOCARDIOGRAM (TEE);  Surgeon: [name], MD;  Location: [facility];  Service: Open Heart Surgery;  Laterality: N/A;   TONSILLECTOMY     Family History  Problem Relation Age of Onset   Heart disease Father    Pancreatic cancer Brother    Colon cancer Neg Hx    Esophageal cancer Neg Hx    Rectal cancer Neg Hx     Social History   Tobacco Use   Smoking status: Former    Packs/day: 1.50    Years: 48.00    Pack years: 72.00    Types: Cigarettes    Quit date: 03/29/2016     Years since quitting: 4.8   Smokeless tobacco: Never  Substance Use Topics   Alcohol use: Yes    Alcohol/week: 14.0 standard drinks    Types: 14 Cans of beer per week    Comment: occas  Marital Status: Divorced   ROS  Review of Systems  Cardiovascular:  Positive for claudication and leg swelling. Negative for chest pain and dyspnea on exertion.  Gastrointestinal:  Negative for melena.  Objective  Blood pressure (!) 147/75, pulse 72, temperature 98.3 F (36.8 C), temperature source Temporal, resp. rate 17, height 6' (1.829 m), weight 217 lb (98.4 kg), SpO2 98 %.  Vitals with BMI 02/14/2021 02/14/2021 08/08/2020  Height - 6' 0"  -  Weight - 217 lbs -  BMI - 93.23 -  Systolic 557 322 025  Diastolic 75 79 66  Pulse 72 75 74     Physical Exam Constitutional:      Appearance: He is well-developed.  Neck:     Vascular: No carotid bruit or JVD.  Cardiovascular:     Rate and Rhythm: Normal rate and regular rhythm.     Pulses: Intact distal pulses.          Femoral pulses are 2+ on the right side and 2+ on the left side.      Popliteal pulses are 1+ on the right side and 2+ on the left side.       Dorsalis pedis pulses are 0 on the right side and 0 on the left side.       Posterior tibial pulses are 1+ on the right side and 1+ on the left side.     Heart sounds: No murmur heard.   No gallop.     Comments: Bilateral chronic venostasis pigmentation.  Pulmonary:     Effort: Pulmonary effort is normal. No accessory muscle usage or respiratory distress.     Breath sounds: Normal breath sounds.  Musculoskeletal:     Right lower leg: Edema (tracre) present.     Left lower leg: Edema (trace) present.  Skin:    General: Skin is warm and dry.     Capillary Refill: Capillary refill takes less than 2 seconds.   Laboratory examination:   External labs:    Labs 11/14/2020:  Total cholesterol 114, triglycerides 112, HDL 41, LDL 58,  non-HDL cholesterol 73.  Hb 15.1/HCT 44.5, platelets  149, mildly reduced compared to 170 in February 2022.  Sodium 133, potassium 4.3, BUN 17, creatinine 1.20, CMP otherwise normal. EGFR 65.     A1c 6.3%.   Medications and allergies  No Known Allergies   Current Outpatient Medications on File Prior to Visit  Medication Sig Dispense Refill   allopurinol (ZYLOPRIM) 100 MG tablet Take 200 mg by mouth daily.   0   amLODipine (NORVASC) 10 MG tablet Take 10 mg by mouth daily.     aspirin EC 81 MG tablet Take 1  tablet (81 mg total) by mouth daily. Swallow whole. 150 tablet 2   atorvastatin (LIPITOR) 80 MG tablet TAKE 1 TABLET(80 MG) BY MOUTH DAILY 90 tablet 3   BD PEN NEEDLE NANO 2ND GEN 32G X 4 MM MISC USE TWICE DAILY WITH LEVEMIR     Calcitriol (VECTICAL) 3 MCG/GM cream Apply topically at bedtime.     chlorthalidone (HYGROTON) 25 MG tablet TAKE 1 TABLET(25 MG) BY MOUTH DAILY 90 tablet 3   clobetasol cream (TEMOVATE) 0.03 % Apply 1 application topically 2 (two) times daily.     clopidogrel (PLAVIX) 75 MG tablet Take 75 mg by mouth daily.     ezetimibe (ZETIA) 10 MG tablet TAKE 1 TABLET(10 MG) BY MOUTH DAILY 30 tablet 6   famotidine (PEPCID) 40 MG tablet Take 40 mg by mouth 2 (two) times daily.     insulin detemir (LEVEMIR) 100 UNIT/ML injection Inject 0.4 mLs (40 Units total) into the skin at bedtime. (Patient taking differently: Inject 44 Units into the skin at bedtime.)     metFORMIN (GLUCOPHAGE) 500 MG tablet Take 1 tablet (500 mg total) by mouth 2 (two) times daily with a meal. 60 tablet 0   sildenafil (REVATIO) 20 MG tablet Take 1 tablet by mouth as needed.     valsartan (DIOVAN) 160 MG tablet Take 1 tablet (160 mg total) by mouth daily. 90 tablet 3   No current facility-administered medications on file prior to visit.    Radiology:   CT chest without contrast 07/01/2019: 1. Lung-RADS 2, benign appearance or behavior. Continue annual screening with low-dose chest CT without contrast in 12 months. 2. Hepatic steatosis. 3. Aortic  Atherosclerosis and Emphysema. 4. Bilateral gynecomastia.  Cardiac Studies:   Echocardiogram 03/30/2016: - Left ventricle: The cavity size was normal. Wall thickness was  normal. Systolic function was normal. The estimated ejection  fraction was in the range of 50% to 55%. Probable severe  hypokinesis and scarring of the basalinferolateral and inferior  myocardium; in the distribution of the right coronary or left  circumflex coronary artery. Features are consistent with a  pseudonormal left ventricular filling pattern, with concomitant  abnormal relaxation and increased filling pressure (grade 2  diastolic dysfunction). - Mitral valve: There was mild to moderate regurgitation. - Left atrium: The atrium was mildly dilated.  Coronary angiogram 05/13/2016: Normal LVEF. RCA ocluded. LAD 3 tandem prox to mid aneurysmal dilatation with 90% stenosis in the mid LAD. Smooth after stenosis. D1 prox 80%. Cx Occluded mid. RI-2 prox to mid 80%.  Peripheral arteriogram 08/26/2017: Bilateral iliac vessels reveal 30-40% diffuse luminal irregularity. Left TP trunk high-grade 99% stenosis S/P HawkOne directional atherectomy followed I 4.0 x 80 mm InPact Admiral DCB to 0% 2 vessel runoff.  Peripheral angiogram 09/23/2017: Severe Rt TP trunk, Rt PTA and Rt peroneal artery stenosis. Near total occlusion mid Rt ATA with collaterals seen distally. Three vessel below the knee run off with slow flow Successful orbital atherectomy with Diamondback 360 1.25 solid micro crown to Rt TP trunk, Rt PTA and Rt peroneal artery, followed by PTA with 3.0 X 60 mm balloon. 0% residual stenosis in Rt PTA, Rt peroneal. 10% residual stenosis in Rt TP trunk. Recommend continued medical management and PTA to Rt ATA if claudication symptoms persist.  Carotid artery duplex 04/26/2020: Stenosis in the right internal carotid artery (1-15%). Stenosis in the right external carotid artery (<50%). Stenosis in the left internal carotid artery  (1-15%). Stenosis in the left external carotid artery (<50%). Antegrade  right vertebral artery flow. Antegrade left vertebral artery flow. Compared to 05/03/2019, right ICA stenosis of <50% now minimal. Follow up studies is appropriate if clinically indicated.  ABI 08/16/2020: This exam reveals mildly decreased perfusion of the right lower extremity, noted at the post tibial artery level (ABI 0.96) and moderately decreased perfusion of the left lower extremity, noted at the post tibial artery level (ABI 0.76). Resistive waveform bilateral AT artery. Compared to 08/20/2018, normal ABI right and mildly decreased ABI left.  EKG   EKG 02/14/2021: Normal sinus rhythm at the rate of 68 bpm, LAD, normal axis, inferior infarct old.  Anterolateral infarct old.  Nonspecific T abnormality.  No significant change from 08/08/2020.   Assessment     ICD-10-CM   1. Atherosclerosis of native coronary artery of native heart without angina pectoris  I25.10     2. PAD (peripheral artery disease) (HCC)  I73.9     3. Hyperlipidemia LDL goal <70  E78.5     4. Essential hypertension  I10 EKG 12-Lead    metoprolol tartrate (LOPRESSOR) 50 MG tablet      Meds ordered this encounter  Medications   metoprolol tartrate (LOPRESSOR) 50 MG tablet    Sig: Take 1 tablet (50 mg total) by mouth 2 (two) times daily.    Dispense:  180 tablet    Refill:  3     Medications Discontinued During This Encounter  Medication Reason   MYRBETRIQ 50 MG TB24 tablet    clotrimazole-betamethasone (LOTRISONE) lotion    metoprolol tartrate (LOPRESSOR) 25 MG tablet     Recommendations:   Drew Harrison  is a 70 y.o. 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  Patient is doing well overall, however he reports he feels symptoms of  claudication has improved and he has no endorgan exercise program, he also quit completely drinking alcohol for the past 6 weeks.  He has not had any chest pain or any worsening dyspnea, no PND or orthopnea.  Overall doing well.  I reviewed his external labs, lipids are under excellent control.  His diabetes is also now well controlled.  With regard to hypertension, will increase metoprolol to tartrate from 25 mg to 50 mg p.o. twice daily.  I will see him back in 6 months for close monitoring.  I am very pleased with his progress, he is also lost some weight since last office visit as well.      Adrian Prows, PA-C 02/14/2021, 1:42 PM Office: [phone]

## 2021-03-09 ENCOUNTER — Telehealth: Payer: Self-pay | Admitting: Cardiology

## 2021-03-09 ENCOUNTER — Other Ambulatory Visit: Payer: Self-pay

## 2021-03-09 DIAGNOSIS — I10 Essential (primary) hypertension: Secondary | ICD-10-CM

## 2021-03-09 MED ORDER — METOPROLOL TARTRATE 50 MG PO TABS: 50.0000 mg | ORAL_TABLET | Freq: Two times a day (BID) | ORAL | 3 refills | Status: DC

## 2021-03-09 NOTE — Telephone Encounter (Signed)
Pt req refill metoprolol tartrate (LOPRESSOR) 50 MG tablet [facility] [phone] - , [place]  [phone]

## 2021-03-21 ENCOUNTER — Encounter: Payer: Self-pay | Admitting: Gastroenterology

## 2021-03-28 ENCOUNTER — Ambulatory Visit
Admission: RE | Admit: 2021-03-28 | Discharge: 2021-03-28 | Disposition: A | Discharge status: Home / Self Care | Payer: PPO | Source: Ambulatory Visit | Attending: Family Medicine | Admitting: Family Medicine

## 2021-03-28 ENCOUNTER — Other Ambulatory Visit: Payer: Self-pay | Admitting: Family Medicine

## 2021-03-28 DIAGNOSIS — M7989 Other specified soft tissue disorders: Secondary | ICD-10-CM

## 2021-04-04 ENCOUNTER — Other Ambulatory Visit: Payer: PPO

## 2021-04-14 ENCOUNTER — Other Ambulatory Visit: Payer: Self-pay | Admitting: Student

## 2021-04-14 DIAGNOSIS — I10 Essential (primary) hypertension: Secondary | ICD-10-CM

## 2021-04-18 ENCOUNTER — Other Ambulatory Visit: Payer: Self-pay

## 2021-04-18 ENCOUNTER — Other Ambulatory Visit: Payer: Self-pay | Admitting: Student

## 2021-04-18 DIAGNOSIS — I10 Essential (primary) hypertension: Secondary | ICD-10-CM

## 2021-04-18 MED ORDER — VALSARTAN 160 MG PO TABS: 160.0000 mg | ORAL_TABLET | Freq: Every day | ORAL | 3 refills | Status: AC

## 2021-05-04 ENCOUNTER — Encounter: Payer: Self-pay | Admitting: Gastroenterology

## 2021-05-04 ENCOUNTER — Ambulatory Visit: Payer: PPO | Admitting: Gastroenterology

## 2021-05-04 VITALS — BP 130/60 | HR 65 | Ht 71.0 in | Wt 215.0 lb

## 2021-05-04 DIAGNOSIS — Z7902 Long term (current) use of antithrombotics/antiplatelets: Secondary | ICD-10-CM

## 2021-05-04 DIAGNOSIS — M6208 Separation of muscle (nontraumatic), other site: Secondary | ICD-10-CM

## 2021-05-04 DIAGNOSIS — Z86010 Personal history of colonic polyps: Secondary | ICD-10-CM

## 2021-05-04 DIAGNOSIS — K219 Gastro-esophageal reflux disease without esophagitis: Secondary | ICD-10-CM | POA: Diagnosis not present

## 2021-05-04 NOTE — Progress Notes (Signed)
HPI :  71 y/o male with a history of CAD s/p CABG 04/4578 complicated by post-operative CVA, history of ischemic CVA in 03/2016, PVD, on Plavix and aspirin 81mg , here to reestablish care and discuss surveillance colonoscopy as well as his history of GERD.  He was last seen in the office in November 2018.  Recall his last colonoscopy was performed in November 2018, he had 3 adenomas removed, no high risk lesions.  He denies any problems with his bowels.  No diarrhea or constipation.  No blood in his stools.  No abdominal pains.  No family history of colon cancer.  His last colonoscopy was his first exam.  He takes famotidine 40 mg once to twice daily for reflux.  He states this works pretty well for his reflux.  Denies any dysphagia.  He had an EGD with me in 2018 which showed a hiatal hernia but no evidence of Barrett's esophagus.  He states he is doing really well from cardiovascular perspective.  Continues to take aspirin and Plavix.  Denies any cardiopulmonary symptoms.  States he has made a lot of lifestyle changes since of last seen him.  He has stopped smoking tobacco completely.  He also has more recently stopped drinking alcohol.  He states he was drinking anywhere from 4-6 beers per day for a while.  Also exercising 5 days a week for the past 2 months and feeling much better about that.  He is hoping to delay colonoscopy until the latest time that is allowable for him to do this.  Prior work-up: Echo 06/17/2016 - EF 50-55%    EGD 02/21/2017 -  - A 4 cm hiatal hernia was present. - One very mild benign-appearing, widely patent intrinsic stenosis was found 40 cm from the incisors. No dilation performed given no symptoms of dysphagia and that it is widely patent. - The exam of the esophagus was otherwise normal. No evidence of Barrett's esophagus - The entire examined stomach was normal. Biopsies were taken from the antrum, body, and incisura with a cold forceps for Helicobacter pylori  testing given the findings of duodenitis as outlined below. - Diffuse inflammation was found in the duodenal bulb, with some erosions, and polypoid erythema. Biopsies were taken with a cold forceps for histology, ensure no adenomatous changes. - The exam of the duodenum was otherwise normal.  Colonoscopy 02/21/2017 - The perianal and digital rectal examinations were normal. - A 4 mm polyp was found in the cecum. The polyp was sessile. The polyp was removed with a cold snare. Resection and retrieval were complete. - Two sessile polyps were found in the sigmoid colon. The polyps were 4 to 5 mm in size. These polyps were removed with a cold snare. Resection and retrieval were complete. - Many small and large-mouthed diverticula were found in the sigmoid colon. - Internal hemorrhoids were found during retroflexion. - The exam was otherwise without abnormality.  1. Surgical [P], sigmoid and cecum, polyp (2) - TUBULAR ADENOMA(S). - HIGH GRADE DYSPLASIA IS NOT IDENTIFIED. 2. Surgical [P], duodenal bulb - BENIGN POLYPOID SMALL BOWEL-TYPE MUCOSA WITH BRUNNER GLAND HYPERPLASIA. - THERE IS NO EVIDENCE OF MALIGNANCY. 3. Surgical [P], gastric antrum and gastric body - CHRONIC INACTIVE GASTRITIS. - THERE IS NO EVIDENCE OF HELICOBACTER PYLORI, DYSPLASIA, OR MALIGNANCY. - SEE COMMENT.     Past Medical History:  Diagnosis Date   Abnormal nuclear stress test    Arthritis    Cellulitis and abscess of hand 05/2017   Coronary artery  disease    3-vessel   Diabetes mellitus without complication (HCC)    GERD (gastroesophageal reflux disease)    Gout    Headache    History of tobacco abuse    Hyperlipidemia    Hypertension    Ischemic stroke ([place])    Stroke ([place]) 03/29/2016   left facial droop   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)    Visual disturbance      Past Surgical History:  Procedure Laterality Date   CORONARY ARTERY BYPASS GRAFT N/A 06/17/2016   Procedure: CORONARY ARTERY BYPASS  GRAFTING (CABG) x four using left internal mammary artery and right greater saphenous leg vein using endoscope.;  Surgeon: [name], MD;  Location: [facility];  Service: Open Heart Surgery;  Laterality: N/A;   LEFT HEART CATH AND CORONARY ANGIOGRAPHY N/A 05/14/2016   Procedure: Left Heart Cath and Coronary Angiography;  Surgeon: Adrian Prows, MD;  Location: [facility];  Service: Cardiovascular;  Laterality: N/A;   LOWER EXTREMITY ANGIOGRAPHY N/A 01/21/2017   Procedure: Lower Extremity Angiography;  Surgeon: Adrian Prows, MD;  Location: [facility];  Service: Cardiovascular;  Laterality: N/A;   LOWER EXTREMITY ANGIOGRAPHY Left 08/26/2017   Procedure: LOWER EXTREMITY ANGIOGRAPHY;  Surgeon: Adrian Prows, MD;  Location: [facility];  Service: Cardiovascular;  Laterality: Left;   LOWER EXTREMITY ANGIOGRAPHY Right 09/23/2017   Procedure: LOWER EXTREMITY ANGIOGRAPHY;  Surgeon: Nigel Mormon, MD;  Location: [facility];  Service: Cardiovascular;  Laterality: Right;   PERIPHERAL VASCULAR ATHERECTOMY  08/26/2017   Procedure: PERIPHERAL VASCULAR ATHERECTOMY;  Surgeon: Adrian Prows, MD;  Location: [facility];  Service: Cardiovascular;;   PERIPHERAL VASCULAR ATHERECTOMY  09/23/2017   Procedure: PERIPHERAL VASCULAR ATHERECTOMY;  Surgeon: Nigel Mormon, MD;  Location: [facility];  Service: Cardiovascular;;   PERIPHERAL VASCULAR BALLOON ANGIOPLASTY  08/26/2017   Procedure: PERIPHERAL VASCULAR BALLOON ANGIOPLASTY;  Surgeon: Adrian Prows, MD;  Location: [facility];  Service: Cardiovascular;;   PERIPHERAL VASCULAR BALLOON ANGIOPLASTY  09/23/2017   Procedure: PERIPHERAL VASCULAR BALLOON ANGIOPLASTY;  Surgeon: Nigel Mormon, MD;  Location: [place] [facility];  Service: Cardiovascular;;   TEE WITHOUT CARDIOVERSION N/A 06/17/2016   Procedure: TRANSESOPHAGEAL ECHOCARDIOGRAM (TEE);  Surgeon: [name], MD;  Location: [facility];  Service: Open Heart Surgery;  Laterality:  N/A;   TONSILLECTOMY     Family History  Problem Relation Age of Onset   Heart disease Father    Pancreatic cancer Brother    Colon cancer Neg Hx    Esophageal cancer Neg Hx    Rectal cancer Neg Hx    Social History   Tobacco Use   Smoking status: Former    Packs/day: 1.50    Years: 48.00    Pack years: 72.00    Types: Cigarettes    Quit date: 03/29/2016    Years since quitting: 5.1   Smokeless tobacco: Never   Tobacco comments:    Quit smoking 03/29/2016  Vaping Use   Vaping Use: Never used  Substance Use Topics   Alcohol use: Not Currently    Alcohol/week: 14.0 standard drinks    Types: 14 Cans of beer per week    Comment: Quit end of October 2022   Drug use: Not Currently    Types: Cocaine    Comment: last usage 12 yrs.   Current Outpatient Medications  Medication Sig Dispense Refill   allopurinol (ZYLOPRIM) 100 MG tablet Take 200 mg  by mouth daily.   0   amLODipine (NORVASC) 10 MG tablet Take 10 mg by mouth daily.     aspirin EC 81 MG tablet Take 1 tablet (81 mg total) by mouth daily. Swallow whole. 150 tablet 2   atorvastatin (LIPITOR) 80 MG tablet TAKE 1 TABLET(80 MG) BY MOUTH DAILY 90 tablet 3   BD PEN NEEDLE NANO 2ND GEN 32G X 4 MM MISC USE TWICE DAILY WITH LEVEMIR     Calcitriol 3 MCG/GM cream Apply topically at bedtime.     chlorthalidone (HYGROTON) 25 MG tablet TAKE 1 TABLET(25 MG) BY MOUTH DAILY 90 tablet 3   clobetasol cream (TEMOVATE) 1.61 % Apply 1 application topically 2 (two) times daily.     clopidogrel (PLAVIX) 75 MG tablet Take 75 mg by mouth daily.     ezetimibe (ZETIA) 10 MG tablet TAKE 1 TABLET(10 MG) BY MOUTH DAILY 30 tablet 6   famotidine (PEPCID) 40 MG tablet Take 40 mg by mouth 2 (two) times daily.     insulin detemir (LEVEMIR) 100 UNIT/ML injection Inject 0.4 mLs (40 Units total) into the skin at bedtime. (Patient taking differently: Inject 44 Units into the skin at bedtime.)     metFORMIN (GLUCOPHAGE) 500 MG tablet Take 1 tablet (500 mg total)  by mouth 2 (two) times daily with a meal. 60 tablet 0   metoprolol tartrate (LOPRESSOR) 50 MG tablet Take 1 tablet (50 mg total) by mouth 2 (two) times daily. 180 tablet 3   sildenafil (REVATIO) 20 MG tablet Take 1 tablet by mouth as needed.     valsartan (DIOVAN) 160 MG tablet Take 1 tablet (160 mg total) by mouth daily. 90 tablet 3   No current facility-administered medications for this visit.   No Known Allergies   Review of Systems: All systems reviewed and negative except where noted in HPI.    Lab Results  Component Value Date   WBC 7.5 06/17/2017   HGB 13.3 06/17/2017   HCT 39.5 06/17/2017   MCV 85.9 06/17/2017   PLT 171 06/17/2017    Lab Results  Component Value Date   CREATININE 1.35 (H) 02/02/2020   BUN 18 02/02/2020   NA 138 02/02/2020   K 4.6 02/02/2020   CL 100 02/02/2020   CO2 24 02/02/2020     Physical Exam: BP 130/60    Pulse 65    Ht 5\' 11"  (1.803 m)    Wt 215 lb (97.5 kg)    BMI 29.99 kg/m  Constitutional: Pleasant,well-developed, male in no acute distress. HEENT: Normocephalic and atraumatic. Conjunctivae are normal. No scleral icterus. Neck supple.  Cardiovascular: Normal rate, regular rhythm.  Pulmonary/chest: Effort normal and breath sounds normal. No wheezing, rales or rhonchi. Abdominal: Soft, nondistended, nontender. (+) diastasis recti midline abdomen.  Extremities: no edema Lymphadenopathy: No cervical adenopathy noted. Neurological: Alert and oriented to person place and time. Skin: Skin is warm and dry. No rashes noted. Psychiatric: Normal mood and affect. Behavior is normal.   ASSESSMENT AND PLAN: 71 year old male here to reestablish care for the following:  History of colon polyps Antiplatelet use GERD Diastases recti  We reviewed his colonoscopy together.  Per national surveillance guidelines, given he had 3 small adenomas on his last exam, he would be due anywhere between 3 and 5 years from his last exam.  He wants to  perform the exam 5 years from his last exam, which will be in November of this year, we will change his recall and he  is agreeable to proceed at that time.  He is doing pretty well from a cardiovascular perspective, has stopped smoking.  He will need approval to hold Plavix for 5 days prior to his colonoscopy, will coordinate that at the time.  He has stopped drinking alcohol.  LFTs and platelet count normal which is reassuring.  We discussed his reflux, using Pepcid monotherapy which is working pretty well, he has no history of Barrett's, can continue this as needed.  On exam he has a diastases recti and we discussed what that is.  Do not recommend surgical repair as it is not bothering him.  He agreed with the plan, I will see him in November for colonoscopy or sooner with any issues  Jolly Mango, MD [facility]

## 2021-05-04 NOTE — Patient Instructions (Signed)
If you are age 71 or older, your body mass index should be between 23-30. Your Body mass index is 29.99 kg/m. If this is out of the aforementioned range listed, please consider follow up with your Primary Care Provider.  If you are age 23 or younger, your body mass index should be between 19-25. Your Body mass index is 29.99 kg/m. If this is out of the aformentioned range listed, please consider follow up with your Primary Care Provider.   ________________________________________________________  The [facility] providers would like to encourage you to use [facility] to communicate with providers for non-urgent requests or questions.  Due to long hold times on the telephone, sending your provider a message by [facility] may be a faster and more efficient way to get a response.  Please allow 48 business hours for a response.  Please remember that this is for non-urgent requests.  _______________________________________________________  Dennis Bast will be due for a recall colonoscopy in 01-2022. We will send you a reminder in the mail when it gets closer to that time.   Thank you for entrusting me with your care and for choosing [facility], Dr. Ontonagon Cellar

## 2021-08-15 ENCOUNTER — Ambulatory Visit: Payer: PPO | Admitting: Cardiology

## 2021-08-17 ENCOUNTER — Encounter: Payer: Self-pay | Admitting: Cardiology

## 2021-08-17 ENCOUNTER — Ambulatory Visit: Payer: PPO | Admitting: Cardiology

## 2021-08-17 VITALS — BP 130/67 | HR 63 | Temp 98.2°F | Resp 17 | Ht 71.0 in | Wt 213.2 lb

## 2021-08-17 DIAGNOSIS — I2510 Atherosclerotic heart disease of native coronary artery without angina pectoris: Secondary | ICD-10-CM

## 2021-08-17 DIAGNOSIS — E785 Hyperlipidemia, unspecified: Secondary | ICD-10-CM

## 2021-08-17 DIAGNOSIS — I739 Peripheral vascular disease, unspecified: Secondary | ICD-10-CM

## 2021-08-17 DIAGNOSIS — I10 Essential (primary) hypertension: Secondary | ICD-10-CM

## 2021-08-17 DIAGNOSIS — I340 Nonrheumatic mitral (valve) insufficiency: Secondary | ICD-10-CM

## 2021-08-17 NOTE — Progress Notes (Signed)
Primary Physician/Referring:  Nickola Major, MD  Patient ID: Drew Harrison, male    DOB: [birth year], 71 y.o.   MRN: [id number]  No chief complaint on file.  HPI:    Drew Harrison  is a 71 y.o. 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  Patient is doing well overall, symptoms of claudication are very mild and stable, he also quit completely drinking alcohol 6 months ago and has remained abstinent. He has had left popliteal and distal TP trunk angioplasty on 08/26/2017 and also right TP trunk and right  peroneal artery angioplasty on 09/23/2017.  ABI performed in May 2022 was unchanged.   Past Medical History:  Diagnosis Date   Abnormal nuclear stress test    Arthritis    Cellulitis and abscess of hand 05/2017   Coronary artery disease    3-vessel   Diabetes mellitus without complication (HCC)    GERD (gastroesophageal reflux disease)    Gout    Headache    History of tobacco abuse    Hyperlipidemia    Hypertension    Ischemic stroke ([place])    Stroke ([place]) 03/29/2016   left facial droop   Stroke due to embolism ([place])    Visual disturbance    Social History   Tobacco Use   Smoking status: Former    Packs/day: 1.50    Years: 48.00    Pack years: 72.00    Types: Cigarettes    Quit date: 03/29/2016    Years since quitting: 5.3   Smokeless tobacco: Never   Tobacco comments:    Quit smoking 03/29/2016  Substance Use Topics   Alcohol use: Not Currently    Alcohol/week: 14.0 standard drinks    Types: 14 Cans of beer per week    Comment: Quit end of October 2022  Marital Status: Divorced   ROS  Review of Systems  Cardiovascular:  Positive for claudication and leg swelling. Negative for chest pain and dyspnea on exertion.  Objective  Blood pressure 130/67, pulse 63,  temperature 98.2 F (36.8 C), temperature source Temporal, resp. rate 17, height 5' 11"  (1.803 m), weight 213 lb 3.2 oz (96.7 kg), SpO2 97 %.     08/17/2021    9:54 AM 08/17/2021    9:53 AM 05/04/2021    8:53 AM  Vitals with BMI  Height  5' 11"  5' 11"   Weight  213 lbs 3 oz 215 lbs  BMI  95.07 30  Systolic 225 750 518  Diastolic 67 67 60  Pulse 63 65 65     Physical Exam Constitutional:      Appearance: He is well-developed.  Neck:     Vascular: No JVD.  Cardiovascular:     Rate and Rhythm: Normal rate and regular rhythm.     Pulses: Intact distal pulses.          Carotid pulses are  on the left side with bruit.      Femoral pulses are 2+ on the right side and 2+ on the left side.      Popliteal pulses are 2+ on the right side and 2+ on the left side.       Dorsalis pedis pulses are 0 on the right side and 0 on the left side.  Posterior tibial pulses are 2+ on the right side and 2+ on the left side.     Heart sounds: Murmur heard.  Mid to late systolic murmur is present with a grade of 2/6 at the apex.    No gallop.     Comments: Bilateral chronic venostasis pigmentation.  Pulmonary:     Effort: Pulmonary effort is normal. No accessory muscle usage or respiratory distress.     Breath sounds: Normal breath sounds.  Musculoskeletal:     Right lower leg: Edema (tracre) present.     Left lower leg: Edema (trace) present.  Skin:    General: Skin is warm and dry.     Capillary Refill: Capillary refill takes less than 2 seconds.   Laboratory examination:   External labs:   Labs 05/22/2021:  A1c 6.8%.  Labs 11/14/2020:  Total cholesterol 114, triglycerides 112, HDL 41, LDL 58,  non-HDL cholesterol 73.  Hb 15.1/HCT 44.5, platelets 149, mildly reduced compared to 170 in February 2022.  Sodium 133, potassium 4.3, BUN 17, creatinine 1.20, CMP otherwise normal. EGFR 65.     A1c 6.3%.   Medications and allergies  No Known Allergies   Current Outpatient Medications:     allopurinol (ZYLOPRIM) 100 MG tablet, Take 200 mg by mouth daily. , Disp: , Rfl: 0   amLODipine (NORVASC) 10 MG tablet, Take 10 mg by mouth daily., Disp: , Rfl:    aspirin EC 81 MG tablet, Take 1 tablet (81 mg total) by mouth daily. Swallow whole., Disp: 150 tablet, Rfl: 2   atorvastatin (LIPITOR) 80 MG tablet, TAKE 1 TABLET(80 MG) BY MOUTH DAILY, Disp: 90 tablet, Rfl: 3   BD PEN NEEDLE NANO 2ND GEN 32G X 4 MM MISC, USE TWICE DAILY WITH LEVEMIR, Disp: , Rfl:    Calcitriol 3 MCG/GM cream, Apply topically as needed., Disp: , Rfl:    chlorthalidone (HYGROTON) 25 MG tablet, TAKE 1 TABLET(25 MG) BY MOUTH DAILY (Patient taking differently: Take 25 mg by mouth daily.), Disp: 90 tablet, Rfl: 3   clobetasol cream (TEMOVATE) 1.74 %, Apply 1 application topically 2 (two) times daily., Disp: , Rfl:    clopidogrel (PLAVIX) 75 MG tablet, Take 75 mg by mouth daily., Disp: , Rfl:    ezetimibe (ZETIA) 10 MG tablet, TAKE 1 TABLET(10 MG) BY MOUTH DAILY, Disp: 30 tablet, Rfl: 6   LANTUS SOLOSTAR 100 UNIT/ML Solostar Pen, SMARTSIG:45 Unit(s) SUB-Q Every Night, Disp: , Rfl:    metFORMIN (GLUCOPHAGE) 500 MG tablet, Take 1 tablet (500 mg total) by mouth 2 (two) times daily with a meal., Disp: 60 tablet, Rfl: 0   metoprolol tartrate (LOPRESSOR) 50 MG tablet, Take 1 tablet (50 mg total) by mouth 2 (two) times daily., Disp: 180 tablet, Rfl: 3   sildenafil (REVATIO) 20 MG tablet, Take 1 tablet by mouth as needed., Disp: , Rfl:    valsartan (DIOVAN) 160 MG tablet, Take 1 tablet (160 mg total) by mouth daily., Disp: 90 tablet, Rfl: 3    Radiology:   CT chest without contrast 07/01/2019: 1. Lung-RADS 2, benign appearance or behavior. Continue annual screening with low-dose chest CT without contrast in 12 months. 2. Hepatic steatosis. 3. Aortic Atherosclerosis and Emphysema. 4. Bilateral gynecomastia.  Cardiac Studies:   Echocardiogram 03/30/2016: - Left ventricle: The cavity size was normal. Wall thickness was   normal. Systolic function was normal. The estimated ejection  fraction was in the range of 50% to 55%. Probable severe  hypokinesis and scarring of the basalinferolateral  and inferior  myocardium; in the distribution of the right coronary or left  circumflex coronary artery. Features are consistent with a  pseudonormal left ventricular filling pattern, with concomitant  abnormal relaxation and increased filling pressure (grade 2  diastolic dysfunction). - Mitral valve: There was mild to moderate regurgitation. - Left atrium: The atrium was mildly dilated.  Coronary angiogram 05/13/2016: Normal LVEF. RCA ocluded. LAD 3 tandem prox to mid aneurysmal dilatation with 90% stenosis in the mid LAD. Smooth after stenosis. D1 prox 80%. Cx Occluded mid. RI-2 prox to mid 80%.  Peripheral arteriogram 08/26/2017: Bilateral iliac vessels reveal 30-40% diffuse luminal irregularity. Left TP trunk high-grade 99% stenosis S/P HawkOne directional atherectomy followed I 4.0 x 80 mm InPact Admiral DCB to 0% 2 vessel runoff.  Peripheral angiogram 09/23/2017: Severe Rt TP trunk, Rt PTA and Rt peroneal artery stenosis. Near total occlusion mid Rt ATA with collaterals seen distally. Three vessel below the knee run off with slow flow Successful orbital atherectomy with Diamondback 360 1.25 solid micro crown to Rt TP trunk, Rt PTA and Rt peroneal artery, followed by PTA with 3.0 X 60 mm balloon. 0% residual stenosis in Rt PTA, Rt peroneal. 10% residual stenosis in Rt TP trunk. Recommend continued medical management and PTA to Rt ATA if claudication symptoms persist.  Carotid artery duplex 04/26/2020: Stenosis in the right internal carotid artery (1-15%). Stenosis in the right external carotid artery (<50%). Stenosis in the left internal carotid artery (1-15%). Stenosis in the left external carotid artery (<50%). Antegrade right vertebral artery flow. Antegrade left vertebral artery flow. Compared to 05/03/2019, right ICA  stenosis of <50% now minimal. Follow up studies is appropriate if clinically indicated.  ABI 08/16/2020: This exam reveals mildly decreased perfusion of the right lower extremity, noted at the post tibial artery level (ABI 0.96) and moderately decreased perfusion of the left lower extremity, noted at the post tibial artery level (ABI 0.76). Resistive waveform bilateral AT artery. Compared to 08/20/2018, normal ABI right and mildly decreased ABI left.  EKG   EKG 08/17/2021: Normal sinus rhythm with rate of 65 bpm, inferior infarct old.  Borderline progression, cannot exclude anteroseptal infarct 4.  Nonspecific T wave abnormality, probably LVH with repolarization. No significant change from EKG 02/14/2021.   Assessment     ICD-10-CM   1. Atherosclerosis of native coronary artery of native heart without angina pectoris  I25.10 PCV ECHOCARDIOGRAM COMPLETE    2. PAD (peripheral artery disease) (HCC)  I73.9     3. Essential hypertension  I10 EKG 12-Lead    4. Hyperlipidemia LDL goal <70  E78.5     5. Moderate mitral regurgitation  I34.0 PCV ECHOCARDIOGRAM COMPLETE      No orders of the defined types were placed in this encounter.    Medications Discontinued During This Encounter  Medication Reason   famotidine (PEPCID) 40 MG tablet    insulin detemir (LEVEMIR) 100 UNIT/ML injection Discontinued by provider     Recommendations:   ASHLEY MONTMINY  is a 71 y.o. 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  Patient is doing well overall, however he reports he feels symptoms of claudication in the calf, however this is remained stable.  He does not think he needs any further intervention at this point and his vascular examination is unchanged.  He also has faint left carotid bruit however his  carotid duplex had revealed only mild atherosclerosis and probably external carotid artery stenosis is a source of bruit.  His A1c has improved significantly as well, overall he has made significant lifestyle changes and has remained abstinent from tobacco and also has completely quit drinking alcohol.  He has not had any chest pain or any worsening dyspnea, no PND or orthopnea.  He has mitral regurgitation murmur appears slightly more prominent today.  I will obtain echocardiogram as it has been 5 years ago.  Otherwise stable from cardiac standpoint, lipids are well controlled, I will see him back on an annual basis.    Adrian Prows, PA-C 08/17/2021, 10:37 AM Office: [phone]

## 2021-09-04 ENCOUNTER — Other Ambulatory Visit: Payer: PPO

## 2021-09-12 ENCOUNTER — Ambulatory Visit: Payer: PPO

## 2021-09-12 DIAGNOSIS — I340 Nonrheumatic mitral (valve) insufficiency: Secondary | ICD-10-CM

## 2021-09-12 DIAGNOSIS — I2510 Atherosclerotic heart disease of native coronary artery without angina pectoris: Secondary | ICD-10-CM

## 2021-11-16 ENCOUNTER — Other Ambulatory Visit: Payer: Self-pay | Admitting: Family Medicine

## 2021-11-16 DIAGNOSIS — Z87891 Personal history of nicotine dependence: Secondary | ICD-10-CM

## 2021-12-18 ENCOUNTER — Ambulatory Visit
Admission: RE | Admit: 2021-12-18 | Discharge: 2021-12-18 | Disposition: A | Discharge status: Home / Self Care | Payer: PPO | Source: Ambulatory Visit | Attending: Family Medicine | Admitting: Family Medicine

## 2021-12-18 DIAGNOSIS — Z87891 Personal history of nicotine dependence: Secondary | ICD-10-CM

## 2021-12-27 ENCOUNTER — Encounter: Payer: Self-pay | Admitting: Cardiology

## 2021-12-27 NOTE — Telephone Encounter (Signed)
From pt

## 2021-12-31 NOTE — Telephone Encounter (Signed)
From Patient

## 2022-01-30 ENCOUNTER — Encounter: Payer: Self-pay | Admitting: Gastroenterology

## 2022-03-01 ENCOUNTER — Other Ambulatory Visit: Payer: Self-pay | Admitting: Cardiology

## 2022-03-01 DIAGNOSIS — I10 Essential (primary) hypertension: Secondary | ICD-10-CM

## 2022-03-22 ENCOUNTER — Other Ambulatory Visit: Payer: Self-pay | Admitting: *Deleted

## 2022-03-22 DIAGNOSIS — K4090 Unilateral inguinal hernia, without obstruction or gangrene, not specified as recurrent: Secondary | ICD-10-CM

## 2022-03-25 HISTORY — PX: BASAL CELL CARCINOMA EXCISION: SHX1214

## 2022-03-26 ENCOUNTER — Other Ambulatory Visit: Payer: Self-pay

## 2022-03-26 ENCOUNTER — Encounter: Payer: Self-pay | Admitting: General Surgery

## 2022-03-26 ENCOUNTER — Ambulatory Visit (INDEPENDENT_AMBULATORY_CARE_PROVIDER_SITE_OTHER): Payer: PPO | Admitting: General Surgery

## 2022-03-26 VITALS — BP 149/66 | HR 61 | Temp 97.7°F | Resp 16 | Ht 71.0 in | Wt 213.0 lb

## 2022-03-26 DIAGNOSIS — K4090 Unilateral inguinal hernia, without obstruction or gangrene, not specified as recurrent: Secondary | ICD-10-CM

## 2022-03-26 NOTE — Progress Notes (Signed)
Drew Harrison; [id number]; [birth year]   HPI Patient is a 72 year old white male who was referred to my care by Dr. Daron Offer for evaluation and treatment of a right inguinal hernia.  He has had a right inguinal hernia for over 10 years.  It only bothers him slightly recently when he is working out.  He has never had an episode of incarceration.  It does reduce on its own when lying down.  He denies any nausea or vomiting.  It is not affecting his daily lifestyle.  It is made worse with straining. Past Medical History:  Diagnosis Date   Abnormal nuclear stress test    Arthritis    BCC (basal cell carcinoma of skin)    Cellulitis and abscess of hand 05/2017   Coronary artery disease    3-vessel   Diabetes mellitus without complication (HCC)    GERD (gastroesophageal reflux disease)    Gout    Headache    History of tobacco abuse    Hyperlipidemia    Hypertension    Ischemic stroke ([place])    Stroke ([place]) 03/29/2016   left facial droop   Stroke due to embolism ([place])    Visual disturbance     Past Surgical History:  Procedure Laterality Date   CORONARY ARTERY BYPASS GRAFT N/A 06/17/2016   Procedure: CORONARY ARTERY BYPASS GRAFTING (CABG) x four using left internal mammary artery and right greater saphenous leg vein using endoscope.;  Surgeon: [name], MD;  Location: [place];  Service: Open Heart Surgery;  Laterality: N/A;   LEFT HEART CATH AND CORONARY ANGIOGRAPHY N/A 05/14/2016   Procedure: Left Heart Cath and Coronary Angiography;  Surgeon: Adrian Prows, MD;  Location: [place] [facility];  Service: Cardiovascular;  Laterality: N/A;   LOWER EXTREMITY ANGIOGRAPHY N/A 01/21/2017   Procedure: Lower Extremity Angiography;  Surgeon: Adrian Prows, MD;  Location: [place] [facility];  Service: Cardiovascular;  Laterality: N/A;   LOWER EXTREMITY ANGIOGRAPHY Left 08/26/2017   Procedure: LOWER EXTREMITY ANGIOGRAPHY;  Surgeon: Adrian Prows, MD;  Location: [place] [facility];  Service: Cardiovascular;   Laterality: Left;   LOWER EXTREMITY ANGIOGRAPHY Right 09/23/2017   Procedure: LOWER EXTREMITY ANGIOGRAPHY;  Surgeon: Nigel Mormon, MD;  Location: [place] [facility];  Service: Cardiovascular;  Laterality: Right;   PERIPHERAL VASCULAR ATHERECTOMY  08/26/2017   Procedure: PERIPHERAL VASCULAR ATHERECTOMY;  Surgeon: Adrian Prows, MD;  Location: [place] [facility];  Service: Cardiovascular;;   PERIPHERAL VASCULAR ATHERECTOMY  09/23/2017   Procedure: PERIPHERAL VASCULAR ATHERECTOMY;  Surgeon: Nigel Mormon, MD;  Location: [place] [facility];  Service: Cardiovascular;;   PERIPHERAL VASCULAR BALLOON ANGIOPLASTY  08/26/2017   Procedure: PERIPHERAL VASCULAR BALLOON ANGIOPLASTY;  Surgeon: Adrian Prows, MD;  Location: [place] [facility];  Service: Cardiovascular;;   PERIPHERAL VASCULAR BALLOON ANGIOPLASTY  09/23/2017   Procedure: PERIPHERAL VASCULAR BALLOON ANGIOPLASTY;  Surgeon: Nigel Mormon, MD;  Location: [place] [facility];  Service: Cardiovascular;;   SKIN CANCER EXCISION     nose- basal cell   TEE WITHOUT CARDIOVERSION N/A 06/17/2016   Procedure: TRANSESOPHAGEAL ECHOCARDIOGRAM (TEE);  Surgeon: [name], MD;  Location: [place];  Service: Open Heart Surgery;  Laterality: N/A;   TONSILLECTOMY      Family History  Problem Relation Age of Onset   Heart disease Father    Pancreatic cancer Brother    Colon cancer Neg Hx    Esophageal cancer Neg Hx    Rectal cancer Neg Hx  Current Outpatient Medications on File Prior to Visit  Medication Sig Dispense Refill   allopurinol (ZYLOPRIM) 100 MG tablet Take 200 mg by mouth daily.   0   amLODipine (NORVASC) 10 MG tablet Take 10 mg by mouth daily.     aspirin EC 81 MG tablet Take 81 mg by mouth daily. Swallow whole.     atorvastatin (LIPITOR) 80 MG tablet TAKE 1 TABLET(80 MG) BY MOUTH DAILY 90 tablet 3   BD PEN NEEDLE NANO 2ND GEN 32G X 4 MM MISC USE TWICE DAILY WITH LEVEMIR     chlorthalidone (HYGROTON) 25 MG tablet TAKE 1  TABLET(25 MG) BY MOUTH DAILY 90 tablet 3   clopidogrel (PLAVIX) 75 MG tablet Take 75 mg by mouth daily.     ezetimibe (ZETIA) 10 MG tablet TAKE 1 TABLET(10 MG) BY MOUTH DAILY 30 tablet 6   LANTUS SOLOSTAR 100 UNIT/ML Solostar Pen SMARTSIG:45 Unit(s) SUB-Q Every Night     metFORMIN (GLUCOPHAGE) 500 MG tablet Take 1 tablet (500 mg total) by mouth 2 (two) times daily with a meal. 60 tablet 0   metoprolol tartrate (LOPRESSOR) 50 MG tablet Take 1 tablet (50 mg total) by mouth 2 (two) times daily. 180 tablet 3   sildenafil (REVATIO) 20 MG tablet Take 1 tablet by mouth as needed.     valsartan (DIOVAN) 160 MG tablet Take 1 tablet (160 mg total) by mouth daily. 90 tablet 3   No current facility-administered medications on file prior to visit.    No Known Allergies  Social History   Substance and Sexual Activity  Alcohol Use Not Currently   Alcohol/week: 14.0 standard drinks of alcohol   Types: 14 Cans of beer per week   Comment: Quit end of October 2022    Social History   Tobacco Use  Smoking Status Former   Packs/day: 1.50   Years: 48.00   Total pack years: 72.00   Types: Cigarettes   Quit date: 03/29/2016   Years since quitting: 5.9  Smokeless Tobacco Never  Tobacco Comments   Quit smoking 03/29/2016    Review of Systems  Constitutional: Negative.   HENT:  Positive for ear pain.   Eyes: Negative.   Respiratory: Negative.    Cardiovascular: Negative.   Gastrointestinal:  Positive for heartburn.  Genitourinary: Negative.   Musculoskeletal:  Positive for back pain.  Neurological: Negative.   Endo/Heme/Allergies:  Bruises/bleeds easily.  Psychiatric/Behavioral: Negative.      Objective   Vitals:   03/26/22 1331  BP: (!) 149/66  Pulse: 61  Resp: 16  Temp: 97.7 F (36.5 C)  SpO2: 98%    Physical Exam Vitals reviewed.  Constitutional:      Appearance: Normal appearance. He is not ill-appearing.  HENT:     Head: Normocephalic and atraumatic.  Cardiovascular:      Rate and Rhythm: Normal rate and regular rhythm.     Heart sounds: Normal heart sounds. No murmur heard.    No friction rub. No gallop.  Pulmonary:     Effort: Pulmonary effort is normal. No respiratory distress.     Breath sounds: Normal breath sounds. No stridor. No wheezing, rhonchi or rales.  Abdominal:     General: Bowel sounds are normal. There is no distension.     Palpations: Abdomen is soft. There is no mass.     Tenderness: There is no abdominal tenderness. There is no guarding or rebound.     Hernia: A hernia is present.  Comments: Large reducible right inguinal hernia.  No left inguinal hernia.  Genitourinary:    Testes: Normal.  Skin:    General: Skin is warm and dry.  Neurological:     Mental Status: He is alert and oriented to person, place, and time.   Primary care notes reviewed  Assessment  Right inguinal hernia Plan  I told the patient that I would only fix the hernia should he become more symptomatic.  He understands and agrees.  He does not want surgical invention at this time.  I told him to call me and return to my office should he have worsening symptoms or starts having episodes of incarceration.  Follow-up with me as needed.

## 2022-04-16 ENCOUNTER — Ambulatory Visit: Payer: PPO | Admitting: Gastroenterology

## 2022-04-16 ENCOUNTER — Telehealth: Payer: Self-pay

## 2022-04-16 ENCOUNTER — Encounter: Payer: Self-pay | Admitting: Gastroenterology

## 2022-04-16 VITALS — BP 138/62 | HR 60 | Ht 71.0 in | Wt 216.4 lb

## 2022-04-16 DIAGNOSIS — K219 Gastro-esophageal reflux disease without esophagitis: Secondary | ICD-10-CM

## 2022-04-16 DIAGNOSIS — Z7902 Long term (current) use of antithrombotics/antiplatelets: Secondary | ICD-10-CM | POA: Diagnosis not present

## 2022-04-16 DIAGNOSIS — Z860100 Personal history of colon polyps, unspecified: Secondary | ICD-10-CM

## 2022-04-16 MED ORDER — NA SULFATE-K SULFATE-MG SULF 17.5-3.13-1.6 GM/177ML PO SOLN
1.0000 | Freq: Once | ORAL | 0 refills | Status: AC
Start: 2022-04-16 — End: 2022-04-16

## 2022-04-16 NOTE — Telephone Encounter (Signed)
   Drew Harrison [birth year] [id number]  Dear Dr. Einar Gip:  We have scheduled the above named patient for a(n) Colonoscopy procedure. Our records show that he is on anticoagulation therapy.  Please advise as to whether the patient may come off their therapy of PLAVIX 5 days prior to their procedure which is scheduled for 05-10-22.  Please route your response to [facility], CMA or fax response to [phone]. Thank you.  Sincerely,    [facility]

## 2022-04-16 NOTE — Progress Notes (Signed)
HPI :  72 y/o male here for a follow up visit to discuss having a colonoscopy for history of colon polyps. Recall he has a history of CAD s/p CABG 08/3783 complicated by post-operative CVA, history of ischemic CVA in 03/2016, PVD, on Plavix and aspirin '81mg'$ .   His last colonoscopy was performed in November 2018, he had 3 adenomas removed, no high risk lesions.  I saw him in about a year ago to discuss timing of his next colonoscopy, we decided to wait 5 years from his last exam so he was not due until November of this past year.  He states he is feeling well without complaints.  No chest pain or shortness of breath, denies any problems with his heart since have last seen him.  He had an echo this past June which showed stable EF without any concerning findings.  Otherwise he is feeling well from a bowel perspective. He denies any problems with his bowels.  No diarrhea or constipation.  No blood in his stools.  No abdominal pains.  No family history of colon cancer.  His last colonoscopy was his first exam.   Recall he had an EGD with me in 2018 which showed a hiatal hernia but no evidence of Barrett's.  He had been using famotidine once to twice daily for his reflux.  He has since not needed to use this and only uses as needed.  No dysphagia.   Prior work-up: Echo 06/17/2016 - EF 50-55%   EGD 02/21/2017 -  - A 4 cm hiatal hernia was present. - One very mild benign-appearing, widely patent intrinsic stenosis was found 40 cm from the incisors. No dilation performed given no symptoms of dysphagia and that it is widely patent. - The exam of the esophagus was otherwise normal. No evidence of Barrett's esophagus - The entire examined stomach was normal. Biopsies were taken from the antrum, body, and incisura with a cold forceps for Helicobacter pylori testing given the findings of duodenitis as outlined below. - Diffuse inflammation was found in the duodenal bulb, with some erosions, and  polypoid erythema. Biopsies were taken with a cold forceps for histology, ensure no adenomatous changes. - The exam of the duodenum was otherwise normal.   Colonoscopy 02/21/2017 - The perianal and digital rectal examinations were normal. - A 4 mm polyp was found in the cecum. The polyp was sessile. The polyp was removed with a cold snare. Resection and retrieval were complete. - Two sessile polyps were found in the sigmoid colon. The polyps were 4 to 5 mm in size. These polyps were removed with a cold snare. Resection and retrieval were complete. - Many small and large-mouthed diverticula were found in the sigmoid colon. - Internal hemorrhoids were found during retroflexion. - The exam was otherwise without abnormality.   1. Surgical [P], sigmoid and cecum, polyp (2) - TUBULAR ADENOMA(S). - HIGH GRADE DYSPLASIA IS NOT IDENTIFIED. 2. Surgical [P], duodenal bulb - BENIGN POLYPOID SMALL BOWEL-TYPE MUCOSA WITH BRUNNER GLAND HYPERPLASIA. - THERE IS NO EVIDENCE OF MALIGNANCY. 3. Surgical [P], gastric antrum and gastric body - CHRONIC INACTIVE GASTRITIS. - THERE IS NO EVIDENCE OF HELICOBACTER PYLORI, DYSPLASIA, OR MALIGNANCY. - SEE COMMENT.   Echo 09/12/21: EF 55-60%, grade I MR   Past Medical History:  Diagnosis Date   Abnormal nuclear stress test    Arthritis    BCC (basal cell carcinoma of skin)    Cellulitis and abscess of hand 05/2017   Coronary artery disease  3-vessel   Diabetes mellitus without complication (HCC)    GERD (gastroesophageal reflux disease)    Gout    Headache    History of tobacco abuse    Hyperlipidemia    Hypertension    Ischemic stroke ([place])    Stroke ([place]) 03/29/2016   left facial droop   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)    Visual disturbance      Past Surgical History:  Procedure Laterality Date   CORONARY ARTERY BYPASS GRAFT N/A 06/17/2016   Procedure: CORONARY ARTERY BYPASS GRAFTING (CABG) x four using left internal mammary artery and right  greater saphenous leg vein using endoscope.;  Surgeon: [name], MD;  Location: [facility];  Service: Open Heart Surgery;  Laterality: N/A;   LEFT HEART CATH AND CORONARY ANGIOGRAPHY N/A 05/14/2016   Procedure: Left Heart Cath and Coronary Angiography;  Surgeon: Adrian Prows, MD;  Location: [facility];  Service: Cardiovascular;  Laterality: N/A;   LOWER EXTREMITY ANGIOGRAPHY N/A 01/21/2017   Procedure: Lower Extremity Angiography;  Surgeon: Adrian Prows, MD;  Location: [facility];  Service: Cardiovascular;  Laterality: N/A;   LOWER EXTREMITY ANGIOGRAPHY Left 08/26/2017   Procedure: LOWER EXTREMITY ANGIOGRAPHY;  Surgeon: Adrian Prows, MD;  Location: [facility];  Service: Cardiovascular;  Laterality: Left;   LOWER EXTREMITY ANGIOGRAPHY Right 09/23/2017   Procedure: LOWER EXTREMITY ANGIOGRAPHY;  Surgeon: Nigel Mormon, MD;  Location: [facility];  Service: Cardiovascular;  Laterality: Right;   MOHS SURGERY     Dec 2023   PERIPHERAL VASCULAR ATHERECTOMY  08/26/2017   Procedure: PERIPHERAL VASCULAR ATHERECTOMY;  Surgeon: Adrian Prows, MD;  Location: [facility];  Service: Cardiovascular;;   PERIPHERAL VASCULAR ATHERECTOMY  09/23/2017   Procedure: PERIPHERAL VASCULAR ATHERECTOMY;  Surgeon: Nigel Mormon, MD;  Location: [facility];  Service: Cardiovascular;;   PERIPHERAL VASCULAR BALLOON ANGIOPLASTY  08/26/2017   Procedure: PERIPHERAL VASCULAR BALLOON ANGIOPLASTY;  Surgeon: Adrian Prows, MD;  Location: [facility];  Service: Cardiovascular;;   PERIPHERAL VASCULAR BALLOON ANGIOPLASTY  09/23/2017   Procedure: PERIPHERAL VASCULAR BALLOON ANGIOPLASTY;  Surgeon: Nigel Mormon, MD;  Location: [facility];  Service: Cardiovascular;;   SKIN CANCER EXCISION     nose- basal cell   TEE WITHOUT CARDIOVERSION N/A 06/17/2016   Procedure: TRANSESOPHAGEAL ECHOCARDIOGRAM (TEE);  Surgeon: [name], MD;  Location: [facility];  Service: Open Heart  Surgery;  Laterality: N/A;   TONSILLECTOMY     Family History  Problem Relation Age of Onset   Heart disease Father    Pancreatic cancer Brother    Colon cancer Neg Hx    Esophageal cancer Neg Hx    Rectal cancer Neg Hx    Stomach cancer Neg Hx    Social History   Tobacco Use   Smoking status: Former    Packs/day: 1.50    Years: 48.00    Total pack years: 72.00    Types: Cigarettes    Quit date: 03/29/2016    Years since quitting: 6.0   Smokeless tobacco: Never   Tobacco comments:    Quit smoking 03/29/2016  Vaping Use   Vaping Use: Never used  Substance Use Topics   Alcohol use: Not Currently    Alcohol/week: 14.0 standard drinks of alcohol    Types: 14 Cans of beer per week    Comment: Quit end of October 2022   Drug use: Not Currently    Types: Cocaine  Comment: last usage 12 yrs.   Current Outpatient Medications  Medication Sig Dispense Refill   allopurinol (ZYLOPRIM) 100 MG tablet Take 200 mg by mouth daily.   0   amLODipine (NORVASC) 10 MG tablet Take 10 mg by mouth daily.     aspirin EC 81 MG tablet Take 81 mg by mouth daily. Swallow whole.     atorvastatin (LIPITOR) 80 MG tablet TAKE 1 TABLET(80 MG) BY MOUTH DAILY 90 tablet 3   BD PEN NEEDLE NANO 2ND GEN 32G X 4 MM MISC USE TWICE DAILY WITH LEVEMIR     chlorthalidone (HYGROTON) 25 MG tablet TAKE 1 TABLET(25 MG) BY MOUTH DAILY 90 tablet 3   clopidogrel (PLAVIX) 75 MG tablet Take 75 mg by mouth daily.     ezetimibe (ZETIA) 10 MG tablet TAKE 1 TABLET(10 MG) BY MOUTH DAILY 30 tablet 6   LANTUS SOLOSTAR 100 UNIT/ML Solostar Pen SMARTSIG:45 Unit(s) SUB-Q Every Night     metFORMIN (GLUCOPHAGE) 500 MG tablet Take 1 tablet (500 mg total) by mouth 2 (two) times daily with a meal. 60 tablet 0   metoprolol tartrate (LOPRESSOR) 50 MG tablet Take 1 tablet (50 mg total) by mouth 2 (two) times daily. 180 tablet 3   sildenafil (REVATIO) 20 MG tablet Take 1 tablet by mouth as needed.     valsartan (DIOVAN) 160 MG tablet Take 1  tablet (160 mg total) by mouth daily. 90 tablet 3   No current facility-administered medications for this visit.   No Known Allergies   Review of Systems: All systems reviewed and negative except where noted in HPI.   Lab Results  Component Value Date   WBC 7.5 06/17/2017   HGB 13.3 06/17/2017   HCT 39.5 06/17/2017   MCV 85.9 06/17/2017   PLT 171 06/17/2017    Lab Results  Component Value Date   CREATININE 1.35 (H) 02/02/2020   BUN 18 02/02/2020   NA 138 02/02/2020   K 4.6 02/02/2020   CL 100 02/02/2020   CO2 24 02/02/2020    Lab Results  Component Value Date   ALT 19 06/20/2016   AST 20 06/20/2016   ALKPHOS 63 06/20/2016   BILITOT 1.1 06/20/2016     Physical Exam: BP 138/62   Pulse 60   Ht '5\' 11"'$  (1.803 m)   Wt 216 lb 6 oz (98.1 kg)   SpO2 96%   BMI 30.18 kg/m  Constitutional: Pleasant,well-developed, male in no acute distress. Neurological: Alert and oriented to person place and time. Psychiatric: Normal mood and affect. Behavior is normal.   ASSESSMENT: 72 y.o. male here for assessment of the following  1. History of colon polyps   2. Antiplatelet or antithrombotic long-term use   3. Gastroesophageal reflux disease, unspecified whether esophagitis present    Due for surveillance colonoscopy.  He has no cardiopulmonary symptoms, no bowel symptoms.  We discussed colonoscopy, risk benefits of the exam and anesthesia, he wants to proceed.  Will reach out to his cardiologist to see if he is able to hold Plavix for 5 days prior to the exam.  He should stay on his baby aspirin throughout the periprocedural timeframe.  Further recommendations pending the results.  Otherwise reflux not bothering him now at all, no history of Barrett's, can use Pepcid as needed in the future.  PLAN: - Colonosscopy at the [facility] - will ask for cardiology approval to hold plavix 5 days in advance - GERD controlled, can use pepcid PRN in the future  Jolly Mango, MD [facility]  Gastroenterology

## 2022-04-16 NOTE — Patient Instructions (Addendum)
If you are age 72 or older, your body mass index should be between 23-30. Your Body mass index is 30.18 kg/m. If this is out of the aforementioned range listed, please consider follow up with your Primary Care Provider.  If you are age 56 or younger, your body mass index should be between 19-25. Your Body mass index is 30.18 kg/m. If this is out of the aformentioned range listed, please consider follow up with your Primary Care Provider.   ________________________________________________________  Drew Harrison have been scheduled for a colonoscopy. Please follow written instructions given to you at your visit today.  Please pick up your prep supplies at the pharmacy within the next 1-3 days. If you use inhalers (even only as needed), please bring them with you on the day of your procedure.  You will be contacted by our office prior to your procedure for directions on holding your Plavix.  If you do not hear from our office 1 week prior to your scheduled procedure, please call [phone] to discuss.   Thank you for entrusting me with your care and for choosing [facility], Dr. Cache Cellar

## 2022-04-16 NOTE — Telephone Encounter (Signed)
Patient can stop Plavix 5 days prior to colonoscopy, restart same day if no biopsy otherwise in 3 to 5 days.   Adrian Prows, MD, [facility] 04/16/2022, 6:23 PM Office: [phone] Fax: [phone] Pager: [phone]

## 2022-04-17 NOTE — Telephone Encounter (Signed)
Called and spoke to patient. He understands to start holding Plavix on 2-11 for procedure on 2-16

## 2022-04-21 ENCOUNTER — Other Ambulatory Visit: Payer: Self-pay | Admitting: Cardiology

## 2022-05-02 ENCOUNTER — Encounter: Payer: Self-pay | Admitting: Gastroenterology

## 2022-05-05 ENCOUNTER — Encounter: Payer: Self-pay | Admitting: Certified Registered Nurse Anesthetist

## 2022-05-10 ENCOUNTER — Encounter: Payer: Self-pay | Admitting: Gastroenterology

## 2022-05-10 ENCOUNTER — Ambulatory Visit (AMBULATORY_SURGERY_CENTER): Payer: PPO | Admitting: Gastroenterology

## 2022-05-10 VITALS — BP 131/77 | HR 58 | Temp 97.7°F | Resp 17 | Ht 71.0 in | Wt 216.0 lb

## 2022-05-10 DIAGNOSIS — Z86010 Personal history of colonic polyps: Secondary | ICD-10-CM

## 2022-05-10 DIAGNOSIS — Z09 Encounter for follow-up examination after completed treatment for conditions other than malignant neoplasm: Secondary | ICD-10-CM | POA: Diagnosis not present

## 2022-05-10 MED ORDER — SODIUM CHLORIDE 0.9 % IV SOLN: 500.0000 mL | INTRAVENOUS | Status: DC

## 2022-05-10 NOTE — Progress Notes (Signed)
Report given to PACU, vss 

## 2022-05-10 NOTE — Progress Notes (Signed)
Pt's states no medical or surgical changes since previsit or office visit. 

## 2022-05-10 NOTE — Patient Instructions (Signed)
May resume Plavix today as normal  YOU HAD AN ENDOSCOPIC PROCEDURE TODAY AT [facility]:   Refer to the procedure report that was given to you for any specific questions about what was found during the examination.  If the procedure report does not answer your questions, please call your gastroenterologist to clarify.  If you requested that your care partner not be given the details of your procedure findings, then the procedure report has been included in a sealed envelope for you to review at your convenience later.  YOU SHOULD EXPECT: Some feelings of bloating in the abdomen. Passage of more gas than usual.  Walking can help get rid of the air that was put into your GI tract during the procedure and reduce the bloating. If you had a lower endoscopy (such as a colonoscopy or flexible sigmoidoscopy) you may notice spotting of blood in your stool or on the toilet paper. If you underwent a bowel prep for your procedure, you may not have a normal bowel movement for a few days.  Please Note:  You might notice some irritation and congestion in your nose or some drainage.  This is from the oxygen used during your procedure.  There is no need for concern and it should clear up in a day or so.  SYMPTOMS TO REPORT IMMEDIATELY:  Following lower endoscopy (colonoscopy or flexible sigmoidoscopy):  Excessive amounts of blood in the stool  Significant tenderness or worsening of abdominal pains  Swelling of the abdomen that is new, acute  Fever of 100F or higher  For urgent or emergent issues, a gastroenterologist can be reached at any hour by calling [phone]. Do not use MyChart messaging for urgent concerns.    DIET:  We do recommend a small meal at first, but then you may proceed to your regular diet.  Drink plenty of fluids but you should avoid alcoholic beverages for 24 hours.  ACTIVITY:  You should plan to take it easy for the rest of today and you should NOT DRIVE or use heavy  machinery until tomorrow (because of the sedation medicines used during the test).    FOLLOW UP: Our staff will call the number listed on your records the next business day following your procedure.  We will call around 7:15- 8:00 am to check on you and address any questions or concerns that you may have regarding the information given to you following your procedure. If we do not reach you, we will leave a message.      SIGNATURES/CONFIDENTIALITY: You and/or your care partner have signed paperwork which will be entered into your electronic medical record.  These signatures attest to the fact that that the information above on your After Visit Summary has been reviewed and is understood.  Full responsibility of the confidentiality of this discharge information lies with you and/or your care-partner.

## 2022-05-10 NOTE — Op Note (Signed)
[facility] Patient Name: Drew Harrison Procedure Date: 05/10/2022 1:19 PM MRN: [id number] Endoscopist: Remo Lipps P. Havery Moros , MD, [id number] Age: 72 Referring MD:  Date of Birth: [birth year] Gender: Male Account #: [id number] Procedure:                Colonoscopy Indications:              High risk colon cancer surveillance: Personal                            history of colonic polyps - 3 small adenomas 2018 Medicines:                Monitored Anesthesia Care Procedure:                Pre-Anesthesia Assessment:                           - Prior to the procedure, a History and Physical                            was performed, and patient medications and                            allergies were reviewed. The patient's tolerance of                            previous anesthesia was also reviewed. The risks                            and benefits of the procedure and the sedation                            options and risks were discussed with the patient.                            All questions were answered, and informed consent                            was obtained. Prior Anticoagulants: The patient has                            taken Plavix (clopidogrel), last dose was 5 days                            prior to procedure. ASA Grade Assessment: III - A                            patient with severe systemic disease. After                            reviewing the risks and benefits, the patient was                            deemed in satisfactory condition to undergo the  procedure.                           After obtaining informed consent, the colonoscope                            was passed under direct vision. Throughout the                            procedure, the patient's blood pressure, pulse, and                            oxygen saturations were monitored continuously. The                            Olympus CF-HQ190L [id number])  Colonoscope was                            introduced through the anus and advanced to the the                            cecum, identified by appendiceal orifice and                            ileocecal valve. The colonoscopy was performed                            without difficulty. The patient tolerated the                            procedure well. The quality of the bowel                            preparation was good. The ileocecal valve,                            appendiceal orifice, and rectum were photographed. Scope In: 1:21:23 PM Scope Out: 1:38:07 PM Scope Withdrawal Time: 0 hours 12 minutes 38 seconds  Total Procedure Duration: 0 hours 16 minutes 44 seconds  Findings:                 The perianal and digital rectal examinations were                            normal.                           Multiple medium-mouthed diverticula were found in                            the sigmoid colon.                           Internal hemorrhoids were found during retroflexion.  The exam was otherwise without abnormality. Complications:            No immediate complications. Estimated blood loss:                            None. Estimated Blood Loss:     Estimated blood loss: none. Impression:               - Diverticulosis in the sigmoid colon.                           - Internal hemorrhoids.                           - The examination was otherwise normal.                           - No polyps Recommendation:           - Patient has a contact number available for                            emergencies. The signs and symptoms of potential                            delayed complications were discussed with the                            patient. Return to normal activities tomorrow.                            Written discharge instructions were provided to the                            patient.                           - Resume previous diet.                            - Continue present medications.                           - Resume Plavix today                           - Moving forward, patient would not be due for                            another colonoscopy for 10 years. At that time he                            will be 72 years old, an age at which most people                            stop having routine surveillance colonoscopy. Given  no history of high risk polyps, do not think the                            patient warrants any further surveillance                            colonoscopy Remo Lipps P. Canuto Kingston, MD 05/10/2022 1:43:12 PM This report has been signed electronically.

## 2022-05-10 NOTE — Progress Notes (Signed)
[facility] History and Physical   Primary Care Physician:  Nickola Major, MD   Reason for Procedure:   History of colon polyps  Plan:    colonoscopy     HPI: Drew Harrison is a 72 y.o. male  here for colonoscopy surveillance - 3 adenomas in 2018. Marland Kitchen Patient denies any bowel symptoms at this time. Otherwise feels well without any cardiopulmonary symptoms. Has held plavix for 5 days for this exam.   I have discussed risks / benefits of anesthesia and endoscopic procedure with [name] and they wish to proceed with the exams as outlined today.    Past Medical History:  Diagnosis Date   Abnormal nuclear stress test    Arthritis    BCC (basal cell carcinoma of skin)    Cellulitis and abscess of hand 05/2017   Coronary artery disease    3-vessel   Diabetes mellitus without complication (HCC)    GERD (gastroesophageal reflux disease)    Gout    Headache    History of tobacco abuse    Hyperlipidemia    Hypertension    Ischemic stroke ([place])    Stroke ([place]) 03/29/2016   left facial droop   Stroke due to embolism ([place])    Visual disturbance     Past Surgical History:  Procedure Laterality Date   CORONARY ARTERY BYPASS GRAFT N/A 06/17/2016   Procedure: CORONARY ARTERY BYPASS GRAFTING (CABG) x four using left internal mammary artery and right greater saphenous leg vein using endoscope.;  Surgeon: [name], MD;  Location: [place];  Service: Open Heart Surgery;  Laterality: N/A;   LEFT HEART CATH AND CORONARY ANGIOGRAPHY N/A 05/14/2016   Procedure: Left Heart Cath and Coronary Angiography;  Surgeon: Adrian Prows, MD;  Location: [facility];  Service: Cardiovascular;  Laterality: N/A;   LOWER EXTREMITY ANGIOGRAPHY N/A 01/21/2017   Procedure: Lower Extremity Angiography;  Surgeon: Adrian Prows, MD;  Location: [facility] [facility];  Service: Cardiovascular;  Laterality: N/A;   LOWER EXTREMITY ANGIOGRAPHY Left 08/26/2017   Procedure: LOWER EXTREMITY  ANGIOGRAPHY;  Surgeon: Adrian Prows, MD;  Location: [facility] [facility];  Service: Cardiovascular;  Laterality: Left;   LOWER EXTREMITY ANGIOGRAPHY Right 09/23/2017   Procedure: LOWER EXTREMITY ANGIOGRAPHY;  Surgeon: Nigel Mormon, MD;  Location: [facility];  Service: Cardiovascular;  Laterality: Right;   MOHS SURGERY     Dec 2023   PERIPHERAL VASCULAR ATHERECTOMY  08/26/2017   Procedure: PERIPHERAL VASCULAR ATHERECTOMY;  Surgeon: Adrian Prows, MD;  Location: [facility];  Service: Cardiovascular;;   PERIPHERAL VASCULAR ATHERECTOMY  09/23/2017   Procedure: PERIPHERAL VASCULAR ATHERECTOMY;  Surgeon: Nigel Mormon, MD;  Location: [place] [facility];  Service: Cardiovascular;;   PERIPHERAL VASCULAR BALLOON ANGIOPLASTY  08/26/2017   Procedure: PERIPHERAL VASCULAR BALLOON ANGIOPLASTY;  Surgeon: Adrian Prows, MD;  Location: [place] [facility];  Service: Cardiovascular;;   PERIPHERAL VASCULAR BALLOON ANGIOPLASTY  09/23/2017   Procedure: PERIPHERAL VASCULAR BALLOON ANGIOPLASTY;  Surgeon: Nigel Mormon, MD;  Location: [facility] [place] [facility];  Service: Cardiovascular;;   SKIN CANCER EXCISION     nose- basal cell   TEE WITHOUT CARDIOVERSION N/A 06/17/2016   Procedure: TRANSESOPHAGEAL ECHOCARDIOGRAM (TEE);  Surgeon: [name], MD;  Location: [place];  Service: Open Heart Surgery;  Laterality: N/A;   TONSILLECTOMY      Prior to Admission medications   Medication Sig Start Date End Date Taking? Authorizing Provider  allopurinol (  ZYLOPRIM) 100 MG tablet Take 200 mg by mouth daily.  02/06/16  Yes [provider]  amLODipine (NORVASC) 10 MG tablet Take 10 mg by mouth daily.   Yes [provider]  aspirin EC 81 MG tablet Take 81 mg by mouth daily. Swallow whole.   Yes [provider]  atorvastatin (LIPITOR) 80 MG tablet TAKE 1 TABLET(80 MG) BY MOUTH DAILY 04/22/22  Yes Adrian Prows, MD  chlorthalidone (HYGROTON) 25 MG tablet TAKE 1 TABLET(25 MG) BY MOUTH  DAILY 01/17/21  Yes Cantwell, Celeste C, PA-C  ezetimibe (ZETIA) 10 MG tablet TAKE 1 TABLET(10 MG) BY MOUTH DAILY 11/15/19  Yes Adrian Prows, MD  LANTUS SOLOSTAR 100 UNIT/ML Solostar Pen SMARTSIG:45 Unit(s) SUB-Q Every Night 06/02/21  Yes [provider]  metFORMIN (GLUCOPHAGE) 500 MG tablet Take 1 tablet (500 mg total) by mouth 2 (two) times daily with a meal. 08/28/17  Yes Adrian Prows, MD  metoprolol tartrate (LOPRESSOR) 50 MG tablet Take 1 tablet (50 mg total) by mouth 2 (two) times daily. 03/01/22  Yes Adrian Prows, MD  valsartan (DIOVAN) 160 MG tablet Take 1 tablet (160 mg total) by mouth daily. 04/18/21  Yes Cantwell, Celeste C, PA-C  BD PEN NEEDLE NANO 2ND GEN 32G X 4 MM MISC USE TWICE DAILY WITH LEVEMIR 05/21/19   [provider]  clopidogrel (PLAVIX) 75 MG tablet Take 75 mg by mouth daily.    [provider]  sildenafil (REVATIO) 20 MG tablet Take 1 tablet by mouth as needed. 06/01/20   [provider]    Current Outpatient Medications  Medication Sig Dispense Refill   allopurinol (ZYLOPRIM) 100 MG tablet Take 200 mg by mouth daily.   0   amLODipine (NORVASC) 10 MG tablet Take 10 mg by mouth daily.     aspirin EC 81 MG tablet Take 81 mg by mouth daily. Swallow whole.     atorvastatin (LIPITOR) 80 MG tablet TAKE 1 TABLET(80 MG) BY MOUTH DAILY 90 tablet 3   chlorthalidone (HYGROTON) 25 MG tablet TAKE 1 TABLET(25 MG) BY MOUTH DAILY 90 tablet 3   ezetimibe (ZETIA) 10 MG tablet TAKE 1 TABLET(10 MG) BY MOUTH DAILY 30 tablet 6   LANTUS SOLOSTAR 100 UNIT/ML Solostar Pen SMARTSIG:45 Unit(s) SUB-Q Every Night     metFORMIN (GLUCOPHAGE) 500 MG tablet Take 1 tablet (500 mg total) by mouth 2 (two) times daily with a meal. 60 tablet 0   metoprolol tartrate (LOPRESSOR) 50 MG tablet Take 1 tablet (50 mg total) by mouth 2 (two) times daily. 180 tablet 3   valsartan (DIOVAN) 160 MG tablet Take 1 tablet (160 mg total) by mouth daily. 90 tablet 3   BD PEN NEEDLE NANO 2ND GEN 32G X  4 MM MISC USE TWICE DAILY WITH LEVEMIR     clopidogrel (PLAVIX) 75 MG tablet Take 75 mg by mouth daily.     sildenafil (REVATIO) 20 MG tablet Take 1 tablet by mouth as needed.     Current Facility-Administered Medications  Medication Dose Route Frequency Provider Last Rate Last Admin   0.9 %  sodium chloride infusion  500 mL Intravenous Continuous Bernetha Anschutz, Carlota Raspberry, MD        Allergies as of 05/10/2022   (No Known Allergies)    Family History  Problem Relation Age of Onset   Heart disease Father    Pancreatic cancer Brother    Colon cancer Neg Hx    Esophageal cancer Neg Hx    Rectal cancer Neg  Hx    Stomach cancer Neg Hx     Social History   Socioeconomic History   Marital status: Divorced    Spouse name: Not on file   Number of children: 1   Years of education: Not on file   Highest education level: Not on file  Occupational History   Occupation: retail sales-retired    Employer: [employer]  Tobacco Use   Smoking status: Former    Packs/day: 1.50    Years: 48.00    Total pack years: 72.00    Types: Cigarettes    Quit date: 03/29/2016    Years since quitting: 6.1   Smokeless tobacco: Never   Tobacco comments:    Quit smoking 03/29/2016  Vaping Use   Vaping Use: Never used  Substance and Sexual Activity   Alcohol use: Not Currently    Alcohol/week: 14.0 standard drinks of alcohol    Types: 14 Cans of beer per week    Comment: Quit end of October 2022   Drug use: Not Currently    Types: Cocaine    Comment: last usage 12 yrs.   Sexual activity: Not on file  Other Topics Concern   Not on file  Social History Narrative   Not on file   Social Determinants of Health   Financial Resource Strain: Not on file  Food Insecurity: Not on file  Transportation Needs: Not on file  Physical Activity: Not on file  Stress: Not on file  Social Connections: Not on file  Intimate Partner Violence: Not on file    Review of Systems: All other review of systems negative  except as mentioned in the HPI.  Physical Exam: Vital signs BP (!) 167/80   Pulse 62   Temp 97.7 F (36.5 C)   Resp 14   Ht 5' 11"$  (1.803 m)   Wt 216 lb (98 kg)   SpO2 97%   BMI 30.13 kg/m   General:   Alert,  Well-developed, pleasant and cooperative in NAD Lungs:  Clear throughout to auscultation.   Heart:  Regular rate and rhythm Abdomen:  Soft, nontender and nondistended.   Neuro/Psych:  Alert and cooperative. Normal mood and affect. A and O x 3  Jolly Mango, MD [facility]

## 2022-05-13 ENCOUNTER — Telehealth: Payer: Self-pay

## 2022-05-13 NOTE — Telephone Encounter (Signed)
  Follow up Call-     05/10/2022   12:30 PM  Call back number  Post procedure Call Back phone  # [phone]  Permission to leave phone message Yes     Patient questions:  Do you have a fever, pain , or abdominal swelling? No. Pain Score  0 *  Have you tolerated food without any problems? Yes.    Have you been able to return to your normal activities? Yes.    Do you have any questions about your discharge instructions: Diet   No. Medications  No. Follow up visit  No.  Do you have questions or concerns about your Care? No.  Actions: * If pain score is 4 or above: No action needed, pain <4.

## 2022-08-16 ENCOUNTER — Ambulatory Visit: Payer: PPO | Admitting: Cardiology

## 2022-08-20 ENCOUNTER — Encounter: Payer: Self-pay | Admitting: Cardiology

## 2022-08-20 ENCOUNTER — Ambulatory Visit: Payer: PPO | Admitting: Cardiology

## 2022-08-20 VITALS — BP 130/62 | HR 69 | Resp 16 | Ht 71.0 in | Wt 209.4 lb

## 2022-08-20 DIAGNOSIS — E785 Hyperlipidemia, unspecified: Secondary | ICD-10-CM

## 2022-08-20 DIAGNOSIS — I10 Essential (primary) hypertension: Secondary | ICD-10-CM

## 2022-08-20 DIAGNOSIS — I739 Peripheral vascular disease, unspecified: Secondary | ICD-10-CM

## 2022-08-20 DIAGNOSIS — I2510 Atherosclerotic heart disease of native coronary artery without angina pectoris: Secondary | ICD-10-CM

## 2022-08-20 NOTE — Progress Notes (Signed)
Primary Physician/Referring:  Gwenlyn Found, MD  Patient ID: Drew Harrison, male    DOB: 11/26/1950, 72 y.o.   MRN: [id number]  Chief Complaint  Patient presents with   Coronary Artery Disease   PAD   Hypertension   Hyperlipidemia   Follow-up    1 year   HPI:    Drew Harrison  is a 72 y.o. 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  Patient is doing well overall, symptoms of claudication are very mild and stable, he also quit completely drinking alcohol 6 months ago and has remained abstinent. He has had left popliteal and distal TP trunk angioplasty on 08/26/2017 and also right TP trunk and right  peroneal artery angioplasty on 09/23/2017.  ABI performed in May 2022 was unchanged.   He remains asymptomatic except for mild symptoms of claudication.  He continues to remain physically active.  Past Medical History:  Diagnosis Date   Abnormal nuclear stress test    Arthritis    BCC (basal cell carcinoma of skin)    Cellulitis and abscess of hand 05/2017   Coronary artery disease    3-vessel   Diabetes mellitus without complication (HCC)    GERD (gastroesophageal reflux disease)    Gout    Headache    History of tobacco abuse    Hyperlipidemia    Hypertension    Ischemic stroke (HCC)    Stroke (HCC) 03/29/2016   left facial droop   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)    Visual disturbance    Social History   Tobacco Use   Smoking status: Former    Packs/day: 1.50    Years: 48.00    Additional pack years: 0.00    Total pack years: 72.00    Types: Cigarettes    Quit date: 03/29/2016    Years since quitting: 6.3   Smokeless tobacco: Never   Tobacco comments:    Quit smoking 03/29/2016  Substance Use Topics   Alcohol use: Not Currently    Alcohol/week: 14.0 standard  drinks of alcohol    Types: 14 Cans of beer per week    Comment: Quit end of October 2022  Marital Status: Divorced   ROS  Review of Systems  Cardiovascular:  Positive for claudication (mild and stable) and leg swelling (occasional). Negative for chest pain and dyspnea on exertion.   Objective  Blood pressure 130/62, pulse 69, resp. rate 16, height 5\' 11"  (1.803 m), weight 209 lb 6.4 oz (95 kg), SpO2 93 %.     08/20/2022   11:16 AM 08/20/2022   10:52 AM 05/10/2022    2:00 PM  Vitals with BMI  Height  5\' 11"    Weight  209 lbs 6 oz   BMI  29.22   Systolic 130 138 409  Diastolic 62 70 77  Pulse  69 58     Physical Exam Constitutional:      Appearance: He is well-developed.  Neck:     Vascular: Carotid bruit (soft, bilateral) present. No JVD.  Cardiovascular:     Rate and Rhythm: Normal rate and regular rhythm.     Pulses: Intact distal pulses.          Femoral pulses are 2+ on the right side and 2+ on the left side.  Popliteal pulses are 2+ on the right side and 2+ on the left side.       Dorsalis pedis pulses are 0 on the right side and 0 on the left side.       Posterior tibial pulses are 2+ on the right side and 1+ on the left side.     Heart sounds: Murmur heard.     Mid to late systolic murmur is present with a grade of 2/6 at the apex.     No gallop.     Comments: Bilateral chronic venostasis pigmentation.  Pulmonary:     Effort: Pulmonary effort is normal. No accessory muscle usage or respiratory distress.     Breath sounds: Normal breath sounds.  Musculoskeletal:     Right lower leg: Edema (trace) present.     Left lower leg: Edema (trace) present.  Skin:    General: Skin is warm and dry.     Capillary Refill: Capillary refill takes less than 2 seconds.    Laboratory examination:   External labs:   Labs 03/28/2022:  Sodium 136, potassium 4.6, BUN 23, creatinine 1.46, EGFR 51 mL, LFTs normal.  Hb 15.3/HCT 44.1, platelets 187.  Normal indicis.  A1c  6.6%.  Labs 10/26/2021:  Total cholesterol 110, triglycerides 109, HDL 36, LDL 54.   Medications and allergies  No Known Allergies   Current Outpatient Medications:    allopurinol (ZYLOPRIM) 100 MG tablet, Take 200 mg by mouth daily. , Disp: , Rfl: 0   amLODipine (NORVASC) 10 MG tablet, Take 10 mg by mouth daily., Disp: , Rfl:    aspirin EC 81 MG tablet, Take 81 mg by mouth daily. Swallow whole., Disp: , Rfl:    atorvastatin (LIPITOR) 80 MG tablet, TAKE 1 TABLET(80 MG) BY MOUTH DAILY, Disp: 90 tablet, Rfl: 3   BD PEN NEEDLE NANO 2ND GEN 32G X 4 MM MISC, USE TWICE DAILY WITH LEVEMIR, Disp: , Rfl:    chlorthalidone (HYGROTON) 25 MG tablet, TAKE 1 TABLET(25 MG) BY MOUTH DAILY, Disp: 90 tablet, Rfl: 3   clopidogrel (PLAVIX) 75 MG tablet, Take 75 mg by mouth daily., Disp: , Rfl:    LANTUS SOLOSTAR 100 UNIT/ML Solostar Pen, SMARTSIG:45 Unit(s) SUB-Q Every Night, Disp: , Rfl:    metFORMIN (GLUCOPHAGE) 500 MG tablet, Take 1 tablet (500 mg total) by mouth 2 (two) times daily with a meal., Disp: 60 tablet, Rfl: 0   metoprolol tartrate (LOPRESSOR) 50 MG tablet, Take 1 tablet (50 mg total) by mouth 2 (two) times daily., Disp: 180 tablet, Rfl: 3   sildenafil (REVATIO) 20 MG tablet, Take 1 tablet by mouth as needed., Disp: , Rfl:    valsartan (DIOVAN) 160 MG tablet, Take 1 tablet (160 mg total) by mouth daily., Disp: 90 tablet, Rfl: 3   ezetimibe (ZETIA) 10 MG tablet, TAKE 1 TABLET(10 MG) BY MOUTH DAILY, Disp: 30 tablet, Rfl: 6    Radiology:   CT chest without contrast 07/01/2019: 1. Lung-RADS 2, benign appearance or behavior. Continue annual screening with low-dose chest CT without contrast in 12 months. 2. Hepatic steatosis. 3. Aortic Atherosclerosis and Emphysema. 4. Bilateral gynecomastia.  Cardiac Studies:   Coronary angiogram 05/13/2016: Normal LVEF. RCA ocluded. LAD 3 tandem prox to mid aneurysmal dilatation with 90% stenosis in the mid LAD. Smooth after stenosis. D1 prox 80%. Cx Occluded  mid. RI-2 prox to mid 80%.    Peripheral arteriogram 08/26/2017: Bilateral iliac vessels reveal 30-40% diffuse luminal irregularity. Left TP trunk high-grade 99% stenosis  S/P HawkOne directional atherectomy followed I 4.0 x 80 mm InPact Admiral DCB to 0% 2 vessel runoff.  Peripheral angiogram 09/23/2017: Severe Rt TP trunk, Rt PTA and Rt peroneal artery stenosis. Near total occlusion mid Rt ATA with collaterals seen distally. Three vessel below the knee run off with slow flow Successful orbital atherectomy with Diamondback 360 1.25 solid micro crown to Rt TP trunk, Rt PTA and Rt peroneal artery, followed by PTA with 3.0 X 60 mm balloon. 0% residual stenosis in Rt PTA, Rt peroneal. 10% residual stenosis in Rt TP trunk. Recommend continued medical management and PTA to Rt ATA if claudication symptoms persist.  Carotid artery duplex 04/26/2020: Stenosis in the right internal carotid artery (1-15%). Stenosis in the right external carotid artery (<50%). Stenosis in the left internal carotid artery (1-15%). Stenosis in the left external carotid artery (<50%). Antegrade right vertebral artery flow. Antegrade left vertebral artery flow. Compared to 05/03/2019, right ICA stenosis of <50% now minimal. Follow up studies is appropriate if clinically indicated.  ABI 08/16/2020: This exam reveals mildly decreased perfusion of the right lower extremity, noted at the post tibial artery level (ABI 0.96) and moderately decreased perfusion of the left lower extremity, noted at the post tibial artery level (ABI 0.76). Resistive waveform bilateral AT artery. Compared to 08/20/2018, normal ABI right and mildly decreased ABI left.  PCV ECHOCARDIOGRAM COMPLETE 09/12/2021  Narrative Echocardiogram 09/12/2021: Normal LV systolic function with visual EF 55-60%. Left ventricle cavity is normal in size. Mild left ventricular hypertrophy. Normal diastolic filling pattern, normal LAP. Left ventricle regional wall motion  findings: Basal inferolateral, Basal inferior, Mid inferolateral, Mid inferior and Apical septal hypokinesis. Left atrial cavity is mildly dilated. Mild (Grade I) mitral regurgitation. Compared to 03/30/2016 LVEF improved from 50-55% to 55-60%, Grade 2 diastolic dysfunction is now normal, Mild/moderate MR is now mild, otherwise no significant change.     EKG   EKG 08/17/2021: Normal sinus rhythm with rate of 65 bpm, inferior infarct old.  Poor R wave progression, cannot exclude anteroseptal infarct old.  Nonspecific T wave abnormality, probably LVH with repolarization. No significant change from EKG 02/14/2021.   Assessment     ICD-10-CM   1. Atherosclerosis of native coronary artery of native heart without angina pectoris  I25.10 EKG 12-Lead    2. PAD (peripheral artery disease) (HCC)  I73.9     3. Essential hypertension  I10     4. Hyperlipidemia LDL goal <70  E78.5       No orders of the defined types were placed in this encounter.    There are no discontinued medications.    Recommendations:   Drew Harrison  is a 72 y.o. 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  1. Atherosclerosis of native coronary artery of native heart without angina pectoris Patient is presently doing well without recurrence of angina pectoris.  His risk factors are also well-controlled.  No change in his EKG.  Although it has been >5 years since his CABG, he has not had any stress testing as he is asymptomatic.  Will continue to monitor this clinically for now. - EKG 12-Lead  2. PAD (peripheral artery disease) (HCC) From the PAD standpoint he has pretty minimal symptoms of claudication left leg greater than the right, no change in his physical exam,  capillary refill is normal.  Continue observation for  now and continue aggressive risk notification.  3. Essential hypertension Blood pressure is well-controlled, renal function is normal, no changes were done today.  4. Hyperlipidemia LDL goal <70 I reviewed his external labs, LDL is at goal.  Will see him back on annual basis.    Drew Decamp, PA-C 08/20/2022, 11:24 AM Office: [phone]

## 2022-11-19 ENCOUNTER — Other Ambulatory Visit: Payer: Self-pay | Admitting: Family Medicine

## 2022-11-19 DIAGNOSIS — Z87891 Personal history of nicotine dependence: Secondary | ICD-10-CM

## 2022-12-24 ENCOUNTER — Other Ambulatory Visit: Payer: PPO

## 2022-12-24 ENCOUNTER — Ambulatory Visit
Admission: RE | Admit: 2022-12-24 | Discharge: 2022-12-24 | Disposition: A | Discharge status: Home / Self Care | Payer: PPO | Source: Ambulatory Visit | Attending: Family Medicine

## 2022-12-24 DIAGNOSIS — Z87891 Personal history of nicotine dependence: Secondary | ICD-10-CM

## 2023-01-21 ENCOUNTER — Other Ambulatory Visit: Payer: Self-pay | Admitting: Cardiology

## 2023-02-03 ENCOUNTER — Telehealth: Payer: Self-pay | Admitting: Cardiology

## 2023-02-03 NOTE — Telephone Encounter (Signed)
   Name: Drew Harrison  DOB: Sep 30, 1950  MRN: [id number]  Primary Cardiologist: Dr.Gangi    Preoperative team, please contact this patient and set up a phone call appointment for further preoperative risk assessment. Please obtain consent and complete medication review. Thank you for your help. Last saw Dr. Jacinto Halim on 08/20/2022  I confirm that guidance regarding antiplatelet and oral anticoagulation therapy has been completed and, if necessary, noted below.  Per office protocol, if patient is without any new symptoms or concerns at the time of their virtual visit, he/she may hold Plavix for 5 days prior to procedure. Please resume Plavix as soon as possible postprocedure, at the discretion of the surgeon.    I also confirmed the patient resides in the state of [place]. As per [facility] Medical Board telemedicine laws, the patient must reside in the state in which the provider is licensed.   Joni Reining, NP 02/03/2023, 1:14 PM [facility]

## 2023-02-03 NOTE — Telephone Encounter (Signed)
     Pre-operative Risk Assessment    Patient Name: Drew Harrison  DOB: 10/21/1950 MRN: [id number]      Request for Surgical Clearance    Procedure:   right tympanoplasty   Date of Surgery:  Clearance TBD                                 Surgeon:  Dr. Marijo Conception Group or Practice Name:  [facility]  Phone number:  [phone] Fax number:  [phone]   Type of Clearance Requested:   - Medical  - Pharmacy:  Hold Clopidogrel (Plavix) defer to cards   Type of Anesthesia:  General    Additional requests/questions:    Signed, Noe Gens   02/03/2023, 12:48 PM

## 2023-02-03 NOTE — Telephone Encounter (Signed)
1st attempt to reach pt regarding surgical clearance and the need for a tele visit.  Phone just rung, no answer.

## 2023-02-10 ENCOUNTER — Encounter: Payer: Self-pay | Admitting: Cardiology

## 2023-02-10 NOTE — Telephone Encounter (Signed)
Pt was returning call and is requesting a callback regarding his tele visit. [phone] is the number he'd like to be called on. Please advise.

## 2023-02-11 ENCOUNTER — Telehealth: Payer: Self-pay | Admitting: *Deleted

## 2023-02-11 NOTE — Telephone Encounter (Signed)
I s/w the pt and he has been scheduled tele pre op appt 02/25/23. Med rec and consent are done. Pt would like to be called if we get a cancellation.

## 2023-02-11 NOTE — Telephone Encounter (Signed)
I s/w the pt and he has been scheduled tele pre op appt 02/25/23. Med rec and consent are done. Pt would like to be called if we get a cancellation.     Patient Consent for Virtual Visit        [facility] has provided verbal consent on 02/11/2023 for a virtual visit (video or telephone).   CONSENT FOR VIRTUAL VISIT FOR:  [facility]  By participating in this virtual visit I agree to the following:  I hereby voluntarily request, consent and authorize [facility] and its employed or contracted physicians, physician assistants, nurse practitioners or other licensed health care professionals (the Practitioner), to provide me with telemedicine health care services (the "Services") as deemed necessary by the treating Practitioner. I acknowledge and consent to receive the Services by the Practitioner via telemedicine. I understand that the telemedicine visit will involve communicating with the Practitioner through live audiovisual communication technology and the disclosure of certain medical information by electronic transmission. I acknowledge that I have been given the opportunity to request an in-person assessment or other available alternative prior to the telemedicine visit and am voluntarily participating in the telemedicine visit.  I understand that I have the right to withhold or withdraw my consent to the use of telemedicine in the course of my care at any time, without affecting my right to future care or treatment, and that the Practitioner or I may terminate the telemedicine visit at any time. I understand that I have the right to inspect all information obtained and/or recorded in the course of the telemedicine visit and may receive copies of available information for a reasonable fee.  I understand that some of the potential risks of receiving the Services via telemedicine include:  Delay or interruption in medical evaluation due to technological equipment failure or  disruption; Information transmitted may not be sufficient (e.g. poor resolution of images) to allow for appropriate medical decision making by the Practitioner; and/or  In rare instances, security protocols could fail, causing a breach of personal health information.  Furthermore, I acknowledge that it is my responsibility to provide information about my medical history, conditions and care that is complete and accurate to the best of my ability. I acknowledge that Practitioner's advice, recommendations, and/or decision may be based on factors not within their control, such as incomplete or inaccurate data provided by me or distortions of diagnostic images or specimens that may result from electronic transmissions. I understand that the practice of medicine is not an exact science and that Practitioner makes no warranties or guarantees regarding treatment outcomes. I acknowledge that a copy of this consent can be made available to me via my patient portal [facility] MyChart), or I can request a printed copy by calling the office of [facility].    I understand that my insurance will be billed for this visit.   I have read or had this consent read to me. I understand the contents of this consent, which adequately explains the benefits and risks of the Services being provided via telemedicine.  I have been provided ample opportunity to ask questions regarding this consent and the Services and have had my questions answered to my satisfaction. I give my informed consent for the services to be provided through the use of telemedicine in my medical care

## 2023-02-25 ENCOUNTER — Ambulatory Visit: Payer: PPO | Attending: Nurse Practitioner | Admitting: Emergency Medicine

## 2023-02-25 DIAGNOSIS — Z01810 Encounter for preprocedural cardiovascular examination: Secondary | ICD-10-CM | POA: Diagnosis not present

## 2023-02-25 NOTE — Progress Notes (Addendum)
Virtual Visit via Telephone Note   Because of [facility]'s co-morbid illnesses, he is at least at moderate risk for complications without adequate follow up.  This format is felt to be most appropriate for this patient at this time.  The patient did not have access to video technology/had technical difficulties with video requiring transitioning to audio format only (telephone).  All issues noted in this document were discussed and addressed.  No physical exam could be performed with this format.  Please refer to the patient's chart for his consent to telehealth for [facility].  Evaluation Performed:  Preoperative cardiovascular risk assessment _____________   Date:  02/25/2023   Patient ID:  D[facility], DOB Feb 01, 1951, MRN [id number] Patient Location:  Home Provider location:   Office  Primary Care Provider:  Gwenlyn Found, MD Primary Cardiologist:  Yates Decamp, MD  Chief Complaint / Patient Profile   72 y.o. y/o male with a h/o coronary artery disease s/p CABGX4 (05/2016), CVA, hypertension, type 2 DM with vascular complications, severe LE PAD with bilateral small vessel angioplasty in 2019 who is pending right tympanoplasty with [facility] [place] by Dr. Jenne Pane and presents today for telephonic preoperative cardiovascular risk assessment.  History of Present Illness    Drew [facility] is a 72 y.o. male who presents via audio/video conferencing for a telehealth visit today.  Pt was last seen in cardiology clinic on 08/20/2022 by Dr. Jacinto Halim.  At that time Drew [facility] was doing well.  The patient is now pending procedure as outlined above. Since his last visit, he denies chest pain, shortness of breath, lower extremity edema, fatigue, palpitations, melena, hematuria, hemoptysis, diaphoresis, weakness, presyncope, syncope, orthopnea, and PND.  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)    Visual disturbance    Past Surgical History:  Procedure Laterality Date   CORONARY ARTERY BYPASS GRAFT N/A 06/17/2016   Procedure: CORONARY ARTERY BYPASS GRAFTING (CABG) x four using left internal mammary artery and right greater saphenous leg vein using endoscope.;  Surgeon: Kerin Perna, MD;  Location: [facility] OR;  Service: Open Heart Surgery;  Laterality: N/A;   LEFT HEART CATH AND CORONARY ANGIOGRAPHY N/A 05/14/2016   Procedure: Left Heart Cath and Coronary Angiography;  Surgeon: Yates Decamp, MD;  Location: [facility] INVASIVE CV LAB;  Service: Cardiovascular;  Laterality: N/A;   LOWER EXTREMITY ANGIOGRAPHY N/A 01/21/2017   Procedure: Lower Extremity Angiography;  Surgeon: Yates Decamp, MD;  Location: [facility] INVASIVE CV LAB;  Service: Cardiovascular;  Laterality: N/A;   LOWER EXTREMITY ANGIOGRAPHY Left 08/26/2017   Procedure: LOWER EXTREMITY ANGIOGRAPHY;  Surgeon: Yates Decamp, MD;  Location: [facility] INVASIVE CV LAB;  Service: Cardiovascular;  Laterality: Left;   LOWER EXTREMITY ANGIOGRAPHY Right 09/23/2017   Procedure: LOWER EXTREMITY ANGIOGRAPHY;  Surgeon: Elder Negus, MD;  Location: [facility] INVASIVE CV LAB;  Service: Cardiovascular;  Laterality: Right;  MOHS SURGERY     Dec 2023   PERIPHERAL VASCULAR ATHERECTOMY  08/26/2017   Procedure: PERIPHERAL VASCULAR ATHERECTOMY;  Surgeon: Yates Decamp, MD;  Location: [facility] INVASIVE CV LAB;  Service: Cardiovascular;;   PERIPHERAL VASCULAR ATHERECTOMY  09/23/2017   Procedure: PERIPHERAL VASCULAR ATHERECTOMY;  Surgeon: Elder Negus, MD;  Location: [facility] INVASIVE CV LAB;  Service:  Cardiovascular;;   PERIPHERAL VASCULAR BALLOON ANGIOPLASTY  08/26/2017   Procedure: PERIPHERAL VASCULAR BALLOON ANGIOPLASTY;  Surgeon: Yates Decamp, MD;  Location: [facility] INVASIVE CV LAB;  Service: Cardiovascular;;   PERIPHERAL VASCULAR BALLOON ANGIOPLASTY  09/23/2017   Procedure: PERIPHERAL VASCULAR BALLOON ANGIOPLASTY;  Surgeon: Elder Negus, MD;  Location: [facility] INVASIVE CV LAB;  Service: Cardiovascular;;   SKIN CANCER EXCISION     nose- basal cell   TEE WITHOUT CARDIOVERSION N/A 06/17/2016   Procedure: TRANSESOPHAGEAL ECHOCARDIOGRAM (TEE);  Surgeon: Kerin Perna, MD;  Location: [facility] OR;  Service: Open Heart Surgery;  Laterality: N/A;   TONSILLECTOMY      Allergies  No Known Allergies  Home Medications    Prior to Admission medications   Medication Sig Start Date End Date Taking? Authorizing Provider  allopurinol (ZYLOPRIM) 100 MG tablet Take 200 mg by mouth daily.  02/06/16   [provider]  amLODipine (NORVASC) 10 MG tablet Take 10 mg by mouth daily.    [provider]  aspirin EC 81 MG tablet Take 81 mg by mouth daily. Swallow whole.    [provider]  atorvastatin (LIPITOR) 80 MG tablet TAKE 1 TABLET(80 MG) BY MOUTH DAILY 01/22/23   Yates Decamp, MD  BD PEN NEEDLE NANO 2ND GEN 32G X 4 MM MISC USE TWICE DAILY WITH LEVEMIR 05/21/19   [provider]  chlorthalidone (HYGROTON) 25 MG tablet TAKE 1 TABLET(25 MG) BY MOUTH DAILY 01/17/21   Cantwell, Celeste C, PA-C  clopidogrel (PLAVIX) 75 MG tablet Take 75 mg by mouth daily.    [provider]  ezetimibe (ZETIA) 10 MG tablet TAKE 1 TABLET(10 MG) BY MOUTH DAILY 11/15/19   Yates Decamp, MD  LANTUS SOLOSTAR 100 UNIT/ML Solostar Pen SMARTSIG:45 Unit(s) SUB-Q Every Night 06/02/21   [provider]  metFORMIN (GLUCOPHAGE) 500 MG tablet Take 1 tablet (500 mg total) by mouth 2 (two) times daily with a meal. 08/28/17   Yates Decamp, MD  metoprolol tartrate (LOPRESSOR) 50 MG tablet Take 1 tablet (50  mg total) by mouth 2 (two) times daily. 03/01/22   Yates Decamp, MD  sildenafil (REVATIO) 20 MG tablet Take 1 tablet by mouth as needed. 06/01/20   [provider]  valsartan (DIOVAN) 160 MG tablet Take 1 tablet (160 mg total) by mouth daily. 04/18/21   Rayford Halsted, PA-C    Physical Exam    Vital Signs:  TERRENCE GRAVITT does not have vital signs available for review today.  Given telephonic nature of communication, physical exam is limited. AAOx3. NAD. Normal affect.  Speech and respirations are unlabored.  Accessory Clinical Findings    None  Assessment & Plan    1.  Preoperative Cardiovascular Risk Assessment: According to the Revised Cardiac Risk Index (RCRI), his Perioperative Risk of Major Cardiac Event is (%): 11. His Functional Capacity in METs is: 8.27 according to the Duke Activity Status Index (DASI). Therefore, based on ACC/AHA guidelines, patient would be at acceptable risk for the planned procedure without further cardiovascular testing. I will route this recommendation to the requesting party via Epic fax function.  The patient  was advised that if he develops new symptoms prior to surgery to contact our office to arrange for a follow-up visit, and he verbalized understanding.  Hold Plavix for 5 days prior to procedure. Please resume Plavix as soon as possible postprocedure, at the discretion of the surgeon.   A copy of this note will be routed to requesting surgeon.  Time:   Today, I have spent 5 minutes with the patient with telehealth technology discussing medical history, symptoms, and management plan.     Denyce Robert, NP  02/25/2023, 2:40 PM

## 2023-03-26 HISTORY — PX: MOHS SURGERY: SUR867

## 2023-04-07 ENCOUNTER — Other Ambulatory Visit: Payer: Self-pay | Admitting: Otolaryngology

## 2023-05-01 ENCOUNTER — Other Ambulatory Visit: Payer: Self-pay | Admitting: Otolaryngology

## 2023-05-08 NOTE — Progress Notes (Addendum)
Surgical Instructions   Your procedure is scheduled on May 15, 2023. Report to [facility] Main Entrance "A" at 10:15 A.M., then check in with the Admitting office. Any questions or running late day of surgery: call [phone]  Questions prior to your surgery date: call [phone], Monday-Friday, 8am-4pm. If you experience any cold or flu symptoms such as cough, fever, chills, shortness of breath, etc. between now and your scheduled surgery, please notify us at the above number.     Remember:  Do not eat after midnight the night before your surgery   You may drink clear liquids until 9:15 the morning of your surgery.   Clear liquids allowed are: Water, Non-Citrus Juices (without pulp), Carbonated Beverages, Clear Tea (no milk, honey, etc.), Black Coffee Only (NO MILK, CREAM OR POWDERED CREAMER of any kind), and Gatorade.    Take these medicines the morning of surgery with A SIP OF WATER  allopurinol (ZYLOPRIM)  amLODipine (NORVASC)  atorvastatin (LIPITOR)  ezetimibe (ZETIA)  metoprolol tartrate (LOPRESSOR)    clopidogrel (PLAVIX) Hold 5 days prior to procedure Last dose 05-09-23 aspirin PLEASE FOLLOW INSTRUCTIONS GIVEN TO YOU BY YOUR SURGEON OR CALL FOR INSTRUCTIONS  One week prior to surgery, STOP taking any Aspirin (unless otherwise instructed by your surgeon) Aleve, Naproxen, Ibuprofen, Motrin, Advil, Goody's, BC's, all herbal medications, fish oil, and non-prescription vitamins.               WHAT DO I DO ABOUT MY DIABETES MEDICATION?   Do not take oral diabetes medicines (pills) the morning of surgery.  THE NIGHT BEFORE SURGERY, take 17 units of LANTUS insulin.            OZEMPIC LAST DOSE   The day of surgery, do not take other diabetes injectables, including Byetta (exenatide), Bydureon (exenatide ER), Victoza (liraglutide), or Trulicity (dulaglutide).  If your CBG is greater than 220 mg/dL, you may take  of your sliding scale (correction) dose of  insulin.   HOW TO MANAGE YOUR DIABETES BEFORE AND AFTER SURGERY  Why is it important to control my blood sugar before and after surgery? Improving blood sugar levels before and after surgery helps healing and can limit problems. A way of improving blood sugar control is eating a healthy diet by:  Eating less sugar and carbohydrates  Increasing activity/exercise  Talking with your doctor about reaching your blood sugar goals High blood sugars (greater than 180 mg/dL) can raise your risk of infections and slow your recovery, so you will need to focus on controlling your diabetes during the weeks before surgery. Make sure that the doctor who takes care of your diabetes knows about your planned surgery including the date and location.  How do I manage my blood sugar before surgery? Check your blood sugar at least 4 times a day, starting 2 days before surgery, to make sure that the level is not too high or low.  Check your blood sugar the morning of your surgery when you wake up and every 2 hours until you get to the Short Stay unit.  If your blood sugar is less than 70 mg/dL, you will need to treat for low blood sugar: Do not take insulin. Treat a low blood sugar (less than 70 mg/dL) with  cup of clear juice (cranberry or apple), 4 glucose tablets, OR glucose gel. Recheck blood sugar in 15 minutes after treatment (to make sure it is greater than 70 mg/dL). If your blood sugar is not greater than 70 mg/dL  on recheck, call [phone] for further instructions. Report your blood sugar to the short stay nurse when you get to Short Stay.  If you are admitted to the [facility] after surgery: Your blood sugar will be checked by the staff and you will probably be given insulin after surgery (instead of oral diabetes medicines) to make sure you have good blood sugar levels. The goal for blood sugar control after surgery is 80-180 mg/dL.       Do NOT Smoke (Tobacco/Vaping) for 24 hours prior to your  procedure.  If you use a CPAP at night, you may bring your mask/headgear for your overnight stay.   You will be asked to remove any contacts, glasses, piercing's, hearing aid's, dentures/partials prior to surgery. Please bring cases for these items if needed.    Patients discharged the day of surgery will not be allowed to drive home, and someone needs to stay with them for 24 hours.  SURGICAL WAITING ROOM VISITATION Patients may have no more than 2 support people in the waiting area - these visitors may rotate.   Pre-op nurse will coordinate an appropriate time for 1 ADULT support person, who may not rotate, to accompany patient in pre-op.  Children under the age of 69 must have an adult with them who is not the patient and must remain in the main waiting area with an adult.  If the patient needs to stay at the [facility] during part of their recovery, the visitor guidelines for inpatient rooms apply.  Please refer to the [facility] website for the visitor guidelines for any additional information.   If you received a COVID test during your pre-op visit  it is requested that you wear a mask when out in public, stay away from anyone that may not be feeling well and notify your surgeon if you develop symptoms. If you have been in contact with anyone that has tested positive in the last 10 days please notify you surgeon.      Pre-operative CHG Bathing Instructions   You can play a key role in reducing the risk of infection after surgery. Your skin needs to be as free of germs as possible. You can reduce the number of germs on your skin by washing with CHG (chlorhexidine gluconate) soap before surgery. CHG is an antiseptic soap that kills germs and continues to kill germs even after washing.   DO NOT use if you have an allergy to chlorhexidine/CHG or antibacterial soaps. If your skin becomes reddened or irritated, stop using the CHG and notify one of our RNs at [phone].               TAKE A SHOWER THE NIGHT BEFORE SURGERY AND THE DAY OF SURGERY    Please keep in mind the following:  DO NOT shave, including legs and underarms, 48 hours prior to surgery.   You may shave your face before/day of surgery.  Place clean sheets on your bed the night before surgery Use a clean washcloth (not used since being washed) for each shower. DO NOT sleep with pet's night before surgery.  CHG Shower Instructions:  Wash your face and private area with normal soap. If you choose to wash your hair, wash first with your normal shampoo.  After you use shampoo/soap, rinse your hair and body thoroughly to remove shampoo/soap residue.  Turn the water OFF and apply half the bottle of CHG soap to a CLEAN washcloth.  Apply CHG soap ONLY FROM YOUR NECK  DOWN TO YOUR TOES (washing for 3-5 minutes)  DO NOT use CHG soap on face, private areas, open wounds, or sores.  Pay special attention to the area where your surgery is being performed.  If you are having back surgery, having someone wash your back for you may be helpful. Wait 2 minutes after CHG soap is applied, then you may rinse off the CHG soap.  Pat dry with a clean towel  Put on clean pajamas    Additional instructions for the day of surgery: DO NOT APPLY any lotions, deodorants, cologne, or perfumes.   Do not wear jewelry or makeup Do not wear nail polish, gel polish, artificial nails, or any other type of covering on natural nails (fingers and toes) Do not bring valuables to the [facility]. [facility] is not responsible for valuables/personal belongings. Put on clean/comfortable clothes.  Please brush your teeth.  Ask your nurse before applying any prescription medications to the skin.

## 2023-05-09 ENCOUNTER — Other Ambulatory Visit (HOSPITAL_COMMUNITY): Payer: PPO

## 2023-05-09 ENCOUNTER — Encounter (HOSPITAL_COMMUNITY)
Admission: RE | Admit: 2023-05-09 | Discharge: 2023-05-09 | Disposition: A | Discharge status: Home / Self Care | Payer: PPO | Source: Ambulatory Visit | Attending: Otolaryngology | Admitting: Otolaryngology

## 2023-05-09 ENCOUNTER — Encounter (HOSPITAL_COMMUNITY): Payer: Self-pay

## 2023-05-09 ENCOUNTER — Other Ambulatory Visit: Payer: Self-pay

## 2023-05-09 VITALS — BP 133/71 | HR 84 | Temp 98.1°F | Resp 17 | Ht 71.0 in | Wt 209.6 lb

## 2023-05-09 DIAGNOSIS — Z8673 Personal history of transient ischemic attack (TIA), and cerebral infarction without residual deficits: Secondary | ICD-10-CM | POA: Diagnosis not present

## 2023-05-09 DIAGNOSIS — I2510 Atherosclerotic heart disease of native coronary artery without angina pectoris: Secondary | ICD-10-CM | POA: Insufficient documentation

## 2023-05-09 DIAGNOSIS — Z7902 Long term (current) use of antithrombotics/antiplatelets: Secondary | ICD-10-CM | POA: Diagnosis not present

## 2023-05-09 DIAGNOSIS — Z01812 Encounter for preprocedural laboratory examination: Secondary | ICD-10-CM | POA: Insufficient documentation

## 2023-05-09 DIAGNOSIS — I10 Essential (primary) hypertension: Secondary | ICD-10-CM | POA: Insufficient documentation

## 2023-05-09 DIAGNOSIS — Z951 Presence of aortocoronary bypass graft: Secondary | ICD-10-CM | POA: Diagnosis not present

## 2023-05-09 DIAGNOSIS — E119 Type 2 diabetes mellitus without complications: Secondary | ICD-10-CM | POA: Insufficient documentation

## 2023-05-09 DIAGNOSIS — Z01818 Encounter for other preprocedural examination: Secondary | ICD-10-CM

## 2023-05-09 HISTORY — DX: Squamous cell carcinoma of skin of scalp and neck: C44.42

## 2023-05-09 HISTORY — DX: Emphysema, unspecified: J43.9

## 2023-05-09 HISTORY — DX: Unilateral inguinal hernia, without obstruction or gangrene, not specified as recurrent: K40.90

## 2023-05-09 LAB — BASIC METABOLIC PANEL
Anion gap: 10 (ref 5–15)
BUN: 17 mg/dL (ref 8–23)
CO2: 27 mmol/L (ref 22–32)
Calcium: 9.5 mg/dL (ref 8.9–10.3)
Chloride: 97 mmol/L — ABNORMAL LOW (ref 98–111)
Creatinine, Ser: 1.27 mg/dL — ABNORMAL HIGH (ref 0.61–1.24)
GFR, Estimated: >60 mL/min (ref >60)
Glucose, Bld: 133 mg/dL — ABNORMAL HIGH (ref 70–99)
Potassium: 4.5 mmol/L (ref 3.5–5.1)
Sodium: 134 mmol/L — ABNORMAL LOW (ref 135–145)

## 2023-05-09 LAB — CBC
HCT: 43.7 % (ref 39.0–52.0)
Hemoglobin: 14.8 g/dL (ref 13.0–17.0)
MCH: 29.2 pg (ref 26.0–34.0)
MCHC: 33.9 g/dL (ref 30.0–36.0)
MCV: 86.2 fL (ref 80.0–100.0)
Platelets: 182 10*3/uL (ref 150–400)
RBC: 5.07 MIL/uL (ref 4.22–5.81)
RDW: 13.3 % (ref 11.5–15.5)
WBC: 9.9 10*3/uL (ref 4.0–10.5)
nRBC: 0 % (ref 0.0–0.2)

## 2023-05-09 LAB — GLUCOSE, CAPILLARY: Glucose-Capillary: 152 mg/dL — ABNORMAL HIGH (ref 70–99)

## 2023-05-09 LAB — HEMOGLOBIN A1C
Hgb A1c MFr Bld: 5.8 % — ABNORMAL HIGH (ref 4.8–5.6)
Mean Plasma Glucose: 119.76 mg/dL

## 2023-05-09 NOTE — Progress Notes (Signed)
PCP - Dr. Brett Fairy Cardiologist - Dr. Yates Decamp  PPM/ICD - denies   Chest x-ray - 07/24/16 EKG - 08/20/22 Stress Test - 04/08/16 ECHO - 09/12/21 Cardiac Cath - 05/14/16  Sleep Study - denies   Fasting Blood Sugar - 110-115 Checks Blood Sugar once a day (also has continuous monitor)  Last dose of GLP1 agonist-  05/07/23 GLP1 instructions: Pt knows not to take any further doses prior to surgery  Blood Thinner Instructions: Hold Plavix 5 days. Last dose 2/14 Aspirin Instructions: Hold 1 week. Last dose 2/12  ERAS Protcol - yes clears until 0915   COVID TEST- n/a   Anesthesia review: yes, cardiac hx  Patient denies shortness of breath, fever, cough and chest pain at PAT appointment   All instructions explained to the patient, with a verbal understanding of the material. Patient agrees to go over the instructions while at home for a better understanding. The opportunity to ask questions was provided.

## 2023-05-12 NOTE — Anesthesia Preprocedure Evaluation (Addendum)
Anesthesia Evaluation  Patient identified by MRN, date of birth, ID band Patient awake    Reviewed: Allergy & Precautions, NPO status , Patient's Chart, lab work & pertinent test results, reviewed documented beta blocker date and time   Airway Mallampati: II  TM Distance: >3 FB     Dental  (+) Teeth Intact, Dental Advisory Given, Caps, Missing   Pulmonary COPD,  COPD inhaler, former smoker   Pulmonary exam normal breath sounds clear to auscultation       Cardiovascular hypertension, Pt. on medications + CAD and + CABG  Normal cardiovascular exam Rhythm:Regular Rate:Normal  CABG x 4 2018  Echo 06/17/16 Left ventricle: Normal cavity size and wall thickness. LV systolic  function is low normal with an EF of 50-55%. Wall motion is abnormal.  Inferior wall motion is hypokinetic. Inferolateral wall motion is  hypokinetic. No thrombus present. No mass present.   Aortic valve: No AV vegetation.   Mitral valve: No leaflet thickening and calcification present. Mild  regurgitation. There is mild prolapse of the posterior mitral leaflet.  Holosystolic mitral valve prolapse.   Right ventricle: Normal cavity size, wall thickness and ejection  fraction.   Left ventricle: Normal cavity size.   EKG 08/20/22 NSR, inferior infarct, poor R wave progression, possible anteroseptal MI, LVH    Neuro/Psych  Headaches Mixed conductive and sensorineural hearing loss of right ear Otorrhea, right ear Tympanic membrane Left facial droop CVA, Residual Symptoms  negative psych ROS   GI/Hepatic Neg liver ROS,GERD  Medicated,,  Endo/Other  diabetes, Well Controlled, Type 2, Oral Hypoglycemic Agents, Insulin Dependent  GLP-1 RA therapy- last dose 1 week ago Gout  Renal/GU negative Renal ROS  negative genitourinary   Musculoskeletal  (+) Arthritis , Osteoarthritis,    Abdominal   Peds  Hematology Plavix therapy- last dose 2/14     Anesthesia Other Findings   Reproductive/Obstetrics ED                              Anesthesia Physical Anesthesia Plan  ASA: 3  Anesthesia Plan: General   Post-op Pain Management: Dilaudid IV, Precedex and Ofirmev IV (intra-op)*   Induction: Intravenous  PONV Risk Score and Plan: 4 or greater and Treatment may vary due to age or medical condition, Ondansetron and Dexamethasone  Airway Management Planned: Oral ETT  Additional Equipment: None  Intra-op Plan:   Post-operative Plan: Extubation in OR  Informed Consent: I have reviewed the patients History and Physical, chart, labs and discussed the procedure including the risks, benefits and alternatives for the proposed anesthesia with the patient or authorized representative who has indicated his/her understanding and acceptance.     Dental advisory given  Plan Discussed with: Anesthesiologist and CRNA  Anesthesia Plan Comments: (PAT note by Antionette Poles, PA-C: 73 year old male follows with cardiology for history of CAD s/p CABG x4 in /2018, CVA, HTN, severe LE PAD with bilateral small vessel angioplasty in 2019.  Seen by Rise Paganini, NP on 02/25/2023 for preop evaluation.  Per note, "According to the Revised Cardiac Risk Index (RCRI), his Perioperative Risk of Major Cardiac Event is (%): 11. His Functional Capacity in METs is: 8.27 according to the Duke Activity Status Index (DASI). Therefore, based on ACC/AHA guidelines, patient would be at acceptable risk for the planned procedure without further cardiovascular testing. I will route this recommendation to the requesting party via Epic fax function. The patient was advised that if he develops  new symptoms prior to surgery to contact our office to arrange for a follow-up visit, and he verbalized understanding. Hold Plavix for 5 days prior to procedure. Please resume Plavix as soon as possible postprocedure, at the discretion of the surgeon."  Patient  reports last dose Plavix 05/09/2023.  Former smoker (72 pack years, quit 2018) with associated emphysema, not on any daily inhaled medications.  IDDM2, reports last dose GLP-1 agonist 05/07/2023.  A1c 5.8 on preop labs.  Preop labs reviewed, mild hyponatremia sodium 134, mildly elevated creatinine at 1.27, otherwise unremarkable.  EKG 08/20/2022: Normal sinus rhythm with rate of 65 bpm, inferior infarct old.  Poor R wave progression, cannot exclude anteroseptal infarct old.  Nonspecific T wave abnormality, probably LVH with repolarization. No significant change from EKG   Echocardiogram 09/12/2021:  Normal LV systolic function with visual EF 55-60%. Left ventricle cavity  is normal in size. Mild left ventricular hypertrophy. Normal diastolic  filling pattern, normal LAP. Left ventricle regional wall motion findings:  Basal inferolateral, Basal inferior, Mid inferolateral, Mid inferior and  Apical septal hypokinesis.  Left atrial cavity is mildly dilated.  Mild (Grade I) mitral regurgitation.  Compared to 03/30/2016 LVEF improved from 50-55% to 55-60%, Grade 2  diastolic dysfunction is now normal, Mild/moderate MR is now mild,  otherwise no significant change.   )         Anesthesia Quick Evaluation

## 2023-05-12 NOTE — Progress Notes (Addendum)
Anesthesia Chart Review:  73 year old male follows with cardiology for history of CAD s/p CABG x4 in /2018, CVA, HTN, severe LE PAD with bilateral small vessel angioplasty in 2019.  Seen by Rise Paganini, NP on 02/25/2023 for preop evaluation.  Per note, "According to the Revised Cardiac Risk Index (RCRI), his Perioperative Risk of Major Cardiac Event is (%): 11. His Functional Capacity in METs is: 8.27 according to the Duke Activity Status Index (DASI). Therefore, based on ACC/AHA guidelines, patient would be at acceptable risk for the planned procedure without further cardiovascular testing. I will route this recommendation to the requesting party via Epic fax function. The patient was advised that if he develops new symptoms prior to surgery to contact our office to arrange for a follow-up visit, and he verbalized understanding. Hold Plavix for 5 days prior to procedure. Please resume Plavix as soon as possible postprocedure, at the discretion of the surgeon."  Patient reports last dose Plavix 05/09/2023.  Former smoker (72 pack years, quit 2018) with associated emphysema, not on any daily inhaled medications.  IDDM2, reports last dose GLP-1 agonist 05/07/2023.  A1c 5.8 on preop labs.  Preop labs reviewed, mild hyponatremia sodium 134, mildly elevated creatinine at 1.27, otherwise unremarkable.  EKG 08/20/2022: Normal sinus rhythm with rate of 65 bpm, inferior infarct old.  Poor R wave progression, cannot exclude anteroseptal infarct old.  Nonspecific T wave abnormality, probably LVH with repolarization. No significant change from EKG   Echocardiogram 09/12/2021:  Normal LV systolic function with visual EF 55-60%. Left ventricle cavity  is normal in size. Mild left ventricular hypertrophy. Normal diastolic  filling pattern, normal LAP. Left ventricle regional wall motion findings:  Basal inferolateral, Basal inferior, Mid inferolateral, Mid inferior and  Apical septal hypokinesis.  Left atrial  cavity is mildly dilated.  Mild (Grade I) mitral regurgitation.  Compared to 03/30/2016 LVEF improved from 50-55% to 55-60%, Grade 2  diastolic dysfunction is now normal, Mild/moderate MR is now mild,  otherwise no significant change.     Zannie Cove [facility] Short Stay Center/Anesthesiology Phone [phone] 05/12/2023 3:05 PM

## 2023-05-15 ENCOUNTER — Encounter (HOSPITAL_COMMUNITY)
Admission: RE | Disposition: A | Discharge status: Home / Self Care | Payer: Self-pay | Source: Home / Self Care | Attending: Otolaryngology

## 2023-05-15 ENCOUNTER — Other Ambulatory Visit: Payer: Self-pay

## 2023-05-15 ENCOUNTER — Ambulatory Visit (HOSPITAL_COMMUNITY): Payer: Self-pay | Admitting: Physician Assistant

## 2023-05-15 ENCOUNTER — Observation Stay (HOSPITAL_COMMUNITY)
Admission: RE | Admit: 2023-05-15 | Discharge: 2023-05-16 | Disposition: A | Discharge status: Home / Self Care | Payer: PPO | Attending: Otolaryngology | Admitting: Otolaryngology

## 2023-05-15 ENCOUNTER — Ambulatory Visit (HOSPITAL_BASED_OUTPATIENT_CLINIC_OR_DEPARTMENT_OTHER): Payer: PPO | Admitting: Anesthesiology

## 2023-05-15 DIAGNOSIS — H7291 Unspecified perforation of tympanic membrane, right ear: Secondary | ICD-10-CM | POA: Diagnosis present

## 2023-05-15 DIAGNOSIS — Z87891 Personal history of nicotine dependence: Secondary | ICD-10-CM | POA: Diagnosis not present

## 2023-05-15 DIAGNOSIS — Z7902 Long term (current) use of antithrombotics/antiplatelets: Secondary | ICD-10-CM | POA: Diagnosis not present

## 2023-05-15 DIAGNOSIS — H9211 Otorrhea, right ear: Secondary | ICD-10-CM | POA: Insufficient documentation

## 2023-05-15 DIAGNOSIS — H7091 Unspecified mastoiditis, right ear: Secondary | ICD-10-CM | POA: Insufficient documentation

## 2023-05-15 DIAGNOSIS — Z85828 Personal history of other malignant neoplasm of skin: Secondary | ICD-10-CM | POA: Diagnosis not present

## 2023-05-15 DIAGNOSIS — Z7982 Long term (current) use of aspirin: Secondary | ICD-10-CM | POA: Diagnosis not present

## 2023-05-15 DIAGNOSIS — Z8673 Personal history of transient ischemic attack (TIA), and cerebral infarction without residual deficits: Secondary | ICD-10-CM | POA: Insufficient documentation

## 2023-05-15 DIAGNOSIS — Z79899 Other long term (current) drug therapy: Secondary | ICD-10-CM | POA: Diagnosis not present

## 2023-05-15 DIAGNOSIS — E119 Type 2 diabetes mellitus without complications: Secondary | ICD-10-CM | POA: Insufficient documentation

## 2023-05-15 DIAGNOSIS — H906 Mixed conductive and sensorineural hearing loss, bilateral: Secondary | ICD-10-CM

## 2023-05-15 DIAGNOSIS — I2510 Atherosclerotic heart disease of native coronary artery without angina pectoris: Secondary | ICD-10-CM | POA: Insufficient documentation

## 2023-05-15 DIAGNOSIS — I10 Essential (primary) hypertension: Secondary | ICD-10-CM | POA: Diagnosis not present

## 2023-05-15 DIAGNOSIS — Z951 Presence of aortocoronary bypass graft: Secondary | ICD-10-CM | POA: Diagnosis not present

## 2023-05-15 DIAGNOSIS — H7090 Unspecified mastoiditis, unspecified ear: Principal | ICD-10-CM | POA: Diagnosis present

## 2023-05-15 HISTORY — PX: TYMPANOMASTOIDECTOMY: SHX34

## 2023-05-15 LAB — GLUCOSE, CAPILLARY
Glucose-Capillary: 121 mg/dL — ABNORMAL HIGH (ref 70–99)
Glucose-Capillary: 102 mg/dL — ABNORMAL HIGH (ref 70–99)
Glucose-Capillary: 119 mg/dL — ABNORMAL HIGH (ref 70–99)

## 2023-05-15 SURGERY — TYMPANOPLASTY, WITH MASTOIDECTOMY
Anesthesia: General | Laterality: Right

## 2023-05-15 MED ORDER — DEXMEDETOMIDINE HCL IN NACL 80 MCG/20ML IV SOLN
INTRAVENOUS | Status: DC | PRN
  Administered 2023-05-15: 8 ug via INTRAVENOUS
  Administered 2023-05-15: 4 ug via INTRAVENOUS
  Administered 2023-05-15: 8 ug via INTRAVENOUS

## 2023-05-15 MED ORDER — EPINEPHRINE HCL (NASAL) 0.1 % NA SOLN
NASAL | Status: AC
  Filled 2023-05-15: qty 30

## 2023-05-15 MED ORDER — ONDANSETRON HCL 4 MG/2ML IJ SOLN: 4.0000 mg | Freq: Once | INTRAMUSCULAR | Status: DC | PRN

## 2023-05-15 MED ORDER — FENTANYL CITRATE (PF) 250 MCG/5ML IJ SOLN
INTRAMUSCULAR | Status: AC
  Filled 2023-05-15: qty 5

## 2023-05-15 MED ORDER — ONDANSETRON HCL 4 MG PO TABS: 4.0000 mg | ORAL_TABLET | ORAL | Status: DC | PRN

## 2023-05-15 MED ORDER — KCL IN DEXTROSE-NACL 20-5-0.45 MEQ/L-%-% IV SOLN
INTRAVENOUS | Status: DC
  Filled 2023-05-15: qty 1000

## 2023-05-15 MED ORDER — CIPROFLOXACIN-DEXAMETHASONE 0.3-0.1 % OT SUSP
OTIC | Status: AC
  Filled 2023-05-15: qty 7.5

## 2023-05-15 MED ORDER — SODIUM CHLORIDE 0.9 % IR SOLN
Status: DC | PRN
  Administered 2023-05-15: 1000 mL

## 2023-05-15 MED ORDER — PHENYLEPHRINE HCL-NACL 20-0.9 MG/250ML-% IV SOLN
INTRAVENOUS | Status: DC | PRN
  Administered 2023-05-15: 30 ug/min via INTRAVENOUS

## 2023-05-15 MED ORDER — HYDROMORPHONE HCL 1 MG/ML IJ SOLN: 0.2500 mg | INTRAMUSCULAR | Status: DC | PRN

## 2023-05-15 MED ORDER — LACTATED RINGERS IV SOLN: INTRAVENOUS | Status: DC

## 2023-05-15 MED ORDER — CHLORHEXIDINE GLUCONATE 0.12 % MT SOLN
15.0000 mL | Freq: Once | OROMUCOSAL | Status: AC
  Administered 2023-05-15: 15 mL via OROMUCOSAL
  Filled 2023-05-15: qty 15

## 2023-05-15 MED ORDER — HYDROMORPHONE HCL 1 MG/ML IJ SOLN
INTRAMUSCULAR | Status: DC | PRN
  Administered 2023-05-15: .5 mg via INTRAVENOUS

## 2023-05-15 MED ORDER — LIDOCAINE 2% (20 MG/ML) 5 ML SYRINGE
INTRAMUSCULAR | Status: DC | PRN
  Administered 2023-05-15: 60 mg via INTRAVENOUS

## 2023-05-15 MED ORDER — EZETIMIBE 10 MG PO TABS: 10.0000 mg | ORAL_TABLET | Freq: Every day | ORAL | Status: DC

## 2023-05-15 MED ORDER — ROCURONIUM BROMIDE 10 MG/ML (PF) SYRINGE
PREFILLED_SYRINGE | INTRAVENOUS | Status: DC | PRN
  Administered 2023-05-15: 50 mg via INTRAVENOUS

## 2023-05-15 MED ORDER — HYDROCODONE-ACETAMINOPHEN 5-325 MG PO TABS
1.0000 | ORAL_TABLET | ORAL | Status: DC | PRN
  Administered 2023-05-16: 2 via ORAL
  Filled 2023-05-15: qty 2

## 2023-05-15 MED ORDER — ATORVASTATIN CALCIUM 80 MG PO TABS: 80.0000 mg | ORAL_TABLET | Freq: Every day | ORAL | Status: DC

## 2023-05-15 MED ORDER — ACETAMINOPHEN 10 MG/ML IV SOLN
INTRAVENOUS | Status: DC | PRN
  Administered 2023-05-15: 1000 mg via INTRAVENOUS

## 2023-05-15 MED ORDER — LIDOCAINE-EPINEPHRINE 1 %-1:100000 IJ SOLN
INTRAMUSCULAR | Status: AC
  Filled 2023-05-15: qty 1

## 2023-05-15 MED ORDER — ONDANSETRON HCL 4 MG/2ML IJ SOLN: 4.0000 mg | INTRAMUSCULAR | Status: DC | PRN

## 2023-05-15 MED ORDER — DEXAMETHASONE SODIUM PHOSPHATE 10 MG/ML IJ SOLN
INTRAMUSCULAR | Status: DC | PRN
  Administered 2023-05-15: 5 mg via INTRAVENOUS

## 2023-05-15 MED ORDER — ACETAMINOPHEN 10 MG/ML IV SOLN
INTRAVENOUS | Status: AC
  Filled 2023-05-15: qty 100

## 2023-05-15 MED ORDER — ONDANSETRON HCL 4 MG/2ML IJ SOLN
INTRAMUSCULAR | Status: DC | PRN
Start: 2023-05-15 — End: 2023-05-15
  Administered 2023-05-15: 4 mg via INTRAVENOUS

## 2023-05-15 MED ORDER — BACITRACIN ZINC 500 UNIT/GM EX OINT
TOPICAL_OINTMENT | CUTANEOUS | Status: AC
  Filled 2023-05-15: qty 28.35

## 2023-05-15 MED ORDER — IRBESARTAN 150 MG PO TABS
150.0000 mg | ORAL_TABLET | Freq: Every day | ORAL | Status: DC
  Administered 2023-05-15: 150 mg via ORAL
  Filled 2023-05-15: qty 1

## 2023-05-15 MED ORDER — SUGAMMADEX SODIUM 200 MG/2ML IV SOLN
INTRAVENOUS | Status: DC | PRN
  Administered 2023-05-15: 188.6 mg via INTRAVENOUS

## 2023-05-15 MED ORDER — CEFAZOLIN SODIUM-DEXTROSE 1-4 GM/50ML-% IV SOLN
1.0000 g | Freq: Three times a day (TID) | INTRAVENOUS | Status: DC
  Administered 2023-05-15 – 2023-05-16 (×2): 1 g via INTRAVENOUS
  Filled 2023-05-15 (×3): qty 50

## 2023-05-15 MED ORDER — DOCUSATE SODIUM 100 MG PO CAPS
100.0000 mg | ORAL_CAPSULE | Freq: Every day | ORAL | Status: DC
  Administered 2023-05-15: 100 mg via ORAL
  Filled 2023-05-15: qty 1

## 2023-05-15 MED ORDER — METHYLENE BLUE (ANTIDOTE) 1 % IV SOLN
INTRAVENOUS | Status: AC
  Filled 2023-05-15: qty 10

## 2023-05-15 MED ORDER — FENTANYL CITRATE (PF) 250 MCG/5ML IJ SOLN
INTRAMUSCULAR | Status: DC | PRN
  Administered 2023-05-15: 50 ug via INTRAVENOUS
  Administered 2023-05-15: 25 ug via INTRAVENOUS
  Administered 2023-05-15 (×2): 50 ug via INTRAVENOUS

## 2023-05-15 MED ORDER — MAGNESIUM OXIDE -MG SUPPLEMENT 400 (240 MG) MG PO TABS
400.0000 mg | ORAL_TABLET | Freq: Every day | ORAL | Status: DC
  Administered 2023-05-15: 400 mg via ORAL
  Filled 2023-05-15: qty 1

## 2023-05-15 MED ORDER — EPINEPHRINE 1 MG/10ML IJ SOSY
PREFILLED_SYRINGE | INTRAMUSCULAR | Status: DC | PRN
  Administered 2023-05-15: 10 mg

## 2023-05-15 MED ORDER — BACITRACIN ZINC 500 UNIT/GM EX OINT
TOPICAL_OINTMENT | CUTANEOUS | Status: DC | PRN
  Administered 2023-05-15: 1 via TOPICAL

## 2023-05-15 MED ORDER — ORAL CARE MOUTH RINSE: 15.0000 mL | Freq: Once | OROMUCOSAL | Status: AC

## 2023-05-15 MED ORDER — INSULIN GLARGINE-YFGN 100 UNIT/ML ~~LOC~~ SOLN
34.0000 [IU] | Freq: Every day | SUBCUTANEOUS | Status: DC
  Administered 2023-05-15: 34 [IU] via SUBCUTANEOUS
  Filled 2023-05-15 (×2): qty 0.34

## 2023-05-15 MED ORDER — METOPROLOL TARTRATE 50 MG PO TABS
50.0000 mg | ORAL_TABLET | Freq: Two times a day (BID) | ORAL | Status: DC
  Administered 2023-05-15: 50 mg via ORAL
  Filled 2023-05-15: qty 1

## 2023-05-15 MED ORDER — EPHEDRINE SULFATE-NACL 50-0.9 MG/10ML-% IV SOSY
PREFILLED_SYRINGE | INTRAVENOUS | Status: DC | PRN
  Administered 2023-05-15: 5 mg via INTRAVENOUS

## 2023-05-15 MED ORDER — CHLORTHALIDONE 25 MG PO TABS
25.0000 mg | ORAL_TABLET | Freq: Every day | ORAL | Status: DC
  Administered 2023-05-15: 25 mg via ORAL
  Filled 2023-05-15 (×2): qty 1

## 2023-05-15 MED ORDER — OXYCODONE HCL 5 MG PO TABS: 5.0000 mg | ORAL_TABLET | Freq: Once | ORAL | Status: DC | PRN

## 2023-05-15 MED ORDER — OXYCODONE HCL 5 MG/5ML PO SOLN: 5.0000 mg | Freq: Once | ORAL | Status: DC | PRN

## 2023-05-15 MED ORDER — ALLOPURINOL 100 MG PO TABS: 200.0000 mg | ORAL_TABLET | Freq: Every day | ORAL | Status: DC

## 2023-05-15 MED ORDER — CEFAZOLIN SODIUM-DEXTROSE 2-4 GM/100ML-% IV SOLN
2.0000 g | INTRAVENOUS | Status: AC
  Administered 2023-05-15: 2 g via INTRAVENOUS
  Filled 2023-05-15: qty 100

## 2023-05-15 MED ORDER — AMLODIPINE BESYLATE 10 MG PO TABS: 10.0000 mg | ORAL_TABLET | Freq: Every day | ORAL | Status: DC

## 2023-05-15 MED ORDER — MORPHINE SULFATE (PF) 2 MG/ML IV SOLN: 2.0000 mg | INTRAVENOUS | Status: DC | PRN

## 2023-05-15 MED ORDER — INSULIN ASPART 100 UNIT/ML IJ SOLN: 0.0000 [IU] | INTRAMUSCULAR | Status: DC | PRN

## 2023-05-15 MED ORDER — METFORMIN HCL 500 MG PO TABS
500.0000 mg | ORAL_TABLET | Freq: Two times a day (BID) | ORAL | Status: DC
  Filled 2023-05-15: qty 1

## 2023-05-15 MED ORDER — BACITRACIN ZINC 500 UNIT/GM EX OINT: TOPICAL_OINTMENT | CUTANEOUS | Status: DC | PRN

## 2023-05-15 MED ORDER — LIDOCAINE-EPINEPHRINE 1 %-1:100000 IJ SOLN
INTRAMUSCULAR | Status: DC | PRN
  Administered 2023-05-15: 6 mL

## 2023-05-15 MED ORDER — STERILE WATER FOR IRRIGATION IR SOLN
Status: DC | PRN
  Administered 2023-05-15: 1000 mL

## 2023-05-15 MED ORDER — ASPIRIN 81 MG PO TBEC
81.0000 mg | DELAYED_RELEASE_TABLET | Freq: Every day | ORAL | Status: DC
  Administered 2023-05-15: 81 mg via ORAL
  Filled 2023-05-15: qty 1

## 2023-05-15 MED ORDER — ACETAMINOPHEN 325 MG PO TABS
650.0000 mg | ORAL_TABLET | Freq: Four times a day (QID) | ORAL | Status: DC | PRN
  Administered 2023-05-15: 650 mg via ORAL
  Filled 2023-05-15: qty 2

## 2023-05-15 MED ORDER — PROPOFOL 10 MG/ML IV BOLUS
INTRAVENOUS | Status: AC
  Filled 2023-05-15: qty 20

## 2023-05-15 MED ORDER — HYDROMORPHONE HCL 1 MG/ML IJ SOLN
INTRAMUSCULAR | Status: AC
  Filled 2023-05-15: qty 0.5

## 2023-05-15 MED ORDER — CIPROFLOXACIN-DEXAMETHASONE 0.3-0.1 % OT SUSP
OTIC | Status: DC | PRN
  Administered 2023-05-15: 20 [drp] via OTIC

## 2023-05-15 MED ORDER — SODIUM CHLORIDE 0.9 % IV SOLN
0.1500 ug/kg/min | INTRAVENOUS | Status: AC
  Administered 2023-05-15: .05 ug/kg/min via INTRAVENOUS
  Filled 2023-05-15: qty 2000

## 2023-05-15 MED ORDER — PROPOFOL 10 MG/ML IV BOLUS
INTRAVENOUS | Status: DC | PRN
  Administered 2023-05-15: 120 mg via INTRAVENOUS

## 2023-05-15 MED ORDER — GELATIN ABSORBABLE 100 EX MISC
CUTANEOUS | Status: DC | PRN
  Administered 2023-05-15: 1 via TOPICAL

## 2023-05-15 SURGICAL SUPPLY — 67 items
ANTIFOG SOL W/FOAM PAD STRL (MISCELLANEOUS) ×1
BAG COUNTER SPONGE SURGICOUNT (BAG) ×1 IMPLANT
BENZOIN TINCTURE PRP APPL 2/3 (GAUZE/BANDAGES/DRESSINGS) ×1 IMPLANT
BLADE EAR TYMPAN 2.5 60D BEAV (BLADE) ×1 IMPLANT
BLADE EYE SICKLE 84 5 BEAV (BLADE) ×1 IMPLANT
BNDG GAUZE DERMACEA FLUFF 4 (GAUZE/BANDAGES/DRESSINGS) ×2 IMPLANT
BNDG STRETCH 4X75 STRL LF (GAUZE/BANDAGES/DRESSINGS) IMPLANT
BUR DIAMOND COARSE 2.0 (BURR) IMPLANT
BUR ROUND FLUTED 5 RND (BURR) IMPLANT
BUR SABER DIAMOND 3.0 (BURR) ×1 IMPLANT
BUR SABER DIAMOND 5.0 (BURR) ×1 IMPLANT
BUR SABER RD CUTTING 3.0 (BURR) IMPLANT
BUR SABER TAPERED RD 1.0 (BURR) IMPLANT
CANISTER SUCT 3000ML PPV (MISCELLANEOUS) ×1 IMPLANT
CLEANER TIP ELECTROSURG 2X2 (MISCELLANEOUS) ×1 IMPLANT
CORD BIPOLAR FORCEPS 12FT (ELECTRODE) IMPLANT
COTTONBALL LRG STERILE PKG (GAUZE/BANDAGES/DRESSINGS) ×1 IMPLANT
COVER SURGICAL LIGHT HANDLE (MISCELLANEOUS) ×1 IMPLANT
DRAIN PENROSE 0.25X18 (DRAIN) IMPLANT
DRAPE EENT ADH APERT 15X15 STR (DRAPES) ×1 IMPLANT
DRAPE EENT ADH APERT 31X51 STR (DRAPES) ×1 IMPLANT
DRAPE HALF SHEET 40X57 (DRAPES) IMPLANT
DRAPE MICROSCOPE LEICA 54X105 (DRAPES) ×1 IMPLANT
DRAPE SURG 17X23 STRL (DRAPES) ×1 IMPLANT
DRSG GLASSCOCK MASTOID ADT (GAUZE/BANDAGES/DRESSINGS) IMPLANT
DRSG TELFA 3X8 NADH STRL (GAUZE/BANDAGES/DRESSINGS) IMPLANT
ELECT COATED BLADE 2.86 ST (ELECTRODE) ×1 IMPLANT
ELECT PAIRED SUBDERMAL (MISCELLANEOUS) ×1
ELECT REM PT RETURN 9FT ADLT (ELECTROSURGICAL) ×1
ELECTRODE PAIRED SUBDERMAL (MISCELLANEOUS) ×1 IMPLANT
ELECTRODE REM PT RTRN 9FT ADLT (ELECTROSURGICAL) ×1 IMPLANT
GAUZE SPONGE 4X4 12PLY STRL (GAUZE/BANDAGES/DRESSINGS) IMPLANT
GLOVE BIO SURGEON STRL SZ7.5 (GLOVE) ×1 IMPLANT
GOWN STRL REUS W/ TWL LRG LVL3 (GOWN DISPOSABLE) ×2 IMPLANT
KIT BASIN OR (CUSTOM PROCEDURE TRAY) ×1 IMPLANT
KIT TURNOVER KIT B (KITS) ×1 IMPLANT
NDL HYPO 25GX1X1/2 BEV (NEEDLE) ×1 IMPLANT
NDL PRECISIONGLIDE 27X1.5 (NEEDLE) ×1 IMPLANT
NEEDLE HYPO 25GX1X1/2 BEV (NEEDLE) ×1
NEEDLE PRECISIONGLIDE 27X1.5 (NEEDLE) ×1
NS IRRIG 1000ML POUR BTL (IV SOLUTION) ×1 IMPLANT
PAD ARMBOARD 7.5X6 YLW CONV (MISCELLANEOUS) ×2 IMPLANT
PATTIES SURGICAL .25X.25 (GAUZE/BANDAGES/DRESSINGS) ×1 IMPLANT
PENCIL SMOKE EVACUATOR (MISCELLANEOUS) ×1 IMPLANT
POSITIONER HEAD DONUT 9IN (MISCELLANEOUS) IMPLANT
PROBE NERVBE PRASS .33 (MISCELLANEOUS) ×1 IMPLANT
PUNCH BIOPSY 4MM (MISCELLANEOUS) ×1
PUNCH BIOPSY DERMAL 6MM STRL (MISCELLANEOUS) ×1 IMPLANT
PUNCH BIOPSY DISP 4 (MISCELLANEOUS) ×1 IMPLANT
SLEEVE IRRIGATION ELITE 7 (MISCELLANEOUS) IMPLANT
SOLUTION ANTFG W/FOAM PAD STRL (MISCELLANEOUS) ×1 IMPLANT
SPONGE SURGIFOAM ABS GEL 12-7 (HEMOSTASIS) ×1 IMPLANT
STAPLER VISISTAT 35W (STAPLE) IMPLANT
SUT BONE WAX W31G (SUTURE) ×1 IMPLANT
SUT PLAIN GUT FAST 5-0 (SUTURE) IMPLANT
SUT VIC AB 3-0 SH 27X BRD (SUTURE) IMPLANT
SUT VIC AB 4-0 PS2 18 (SUTURE) IMPLANT
SYR BULB EAR ULCER 3OZ GRN STR (SYRINGE) ×1 IMPLANT
TAPE PAPER 1X10 WHT MICROPORE (GAUZE/BANDAGES/DRESSINGS) IMPLANT
TAPE UMBILICAL 1/8X30 (MISCELLANEOUS) ×1 IMPLANT
TOWEL GREEN STERILE FF (TOWEL DISPOSABLE) ×1 IMPLANT
TRAY ENT MC OR (CUSTOM PROCEDURE TRAY) ×1 IMPLANT
TRAY FOLEY MTR SLVR 14FR STAT (SET/KITS/TRAYS/PACK) IMPLANT
TUBING EXTENTION W/L.L. (IV SETS) ×1 IMPLANT
TUBING IRRIGATION (MISCELLANEOUS) ×1 IMPLANT
WATER STERILE IRR 1000ML POUR (IV SOLUTION) ×1 IMPLANT
WIPE INSTRUMENT VISIWIPE 73X73 (MISCELLANEOUS) ×1 IMPLANT

## 2023-05-15 NOTE — Plan of Care (Signed)

## 2023-05-15 NOTE — Transfer of Care (Signed)
Immediate Anesthesia Transfer of Care Note  Patient: Drew Harrison  Procedure(s) Performed: RIGHT TYMPANOMASTOIDECTOMY WITH OSSICULAR CHAIN RECONSTRUCTION (Right)  Patient Location: PACU  Anesthesia Type:General  Level of Consciousness: drowsy, patient cooperative, and responds to stimulation  Airway & Oxygen Therapy: Patient Spontanous Breathing and Patient connected to face mask oxygen  Post-op Assessment: Report given to RN and Post -op Vital signs reviewed and stable  Post vital signs: Reviewed and stable  Last Vitals:  Vitals Value Taken Time  BP 124/60 05/15/23 1615  Temp 36.8 C 05/15/23 1612  Pulse 82 05/15/23 1615  Resp 10 05/15/23 1615  SpO2 96 % 05/15/23 1615  Vitals shown include unfiled device data.  Last Pain:  Vitals:   05/15/23 1049  PainSc: 0-No pain         Complications: No notable events documented.

## 2023-05-15 NOTE — H&P (Signed)
Drew Harrison is an 73 y.o. male.   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  Past Medical History:  Diagnosis Date   Abnormal nuclear stress test    Arthritis    BCC (basal cell carcinoma of skin)    Cellulitis and abscess of hand 05/2017   Coronary artery disease    3-vessel   Diabetes mellitus without complication (HCC)    Emphysema lung (HCC)    GERD (gastroesophageal reflux disease)    Gout    Headache    History of tobacco abuse    Hyperlipidemia    Hypertension    Inguinal hernia    right   Ischemic stroke (HCC)    Squamous cell carcinoma of scalp    Stroke (HCC) 03/29/2016   left facial droop   Stroke due to embolism [facility])    Visual disturbance     Past Surgical History:  Procedure Laterality Date   BASAL CELL CARCINOMA EXCISION  2024   right calf   CATARACT EXTRACTION     CORONARY ARTERY BYPASS GRAFT N/A 06/17/2016   Procedure: CORONARY ARTERY BYPASS GRAFTING (CABG) x four using left internal mammary artery and right greater saphenous leg vein using endoscope.;  Surgeon: Kerin Perna, MD;  Location: [facility] OR;  Service: Open Heart Surgery;  Laterality: N/A;   LEFT HEART CATH AND CORONARY ANGIOGRAPHY N/A 05/14/2016   Procedure: Left Heart Cath and Coronary Angiography;  Surgeon: Yates Decamp, MD;  Location: [facility] INVASIVE CV LAB;  Service: Cardiovascular;  Laterality: N/A;   LOWER EXTREMITY ANGIOGRAPHY N/A 01/21/2017   Procedure: Lower Extremity Angiography;  Surgeon: Yates Decamp, MD;  Location: [facility] INVASIVE CV LAB;  Service: Cardiovascular;  Laterality: N/A;   LOWER EXTREMITY ANGIOGRAPHY Left 08/26/2017   Procedure: LOWER EXTREMITY ANGIOGRAPHY;  Surgeon: Yates Decamp, MD;  Location: [facility] INVASIVE CV LAB;  Service: Cardiovascular;  Laterality: Left;   LOWER EXTREMITY ANGIOGRAPHY Right 09/23/2017   Procedure: LOWER EXTREMITY ANGIOGRAPHY;  Surgeon: Elder Negus,  MD;  Location: [facility] INVASIVE CV LAB;  Service: Cardiovascular;  Laterality: Right;   MOHS SURGERY     Dec 2023   MOHS SURGERY  03/2023   scalp   PERIPHERAL VASCULAR ATHERECTOMY  08/26/2017   Procedure: PERIPHERAL VASCULAR ATHERECTOMY;  Surgeon: Yates Decamp, MD;  Location[facility] INV[facility] CV LAB;  Service: Cardiovascular;;   PERIPHERAL VASCULAR ATHERECTOMY  09/23/2017   Procedure: PERIPHERAL VASCULAR ATHERECTOMY;  Surgeon: Elder Negus, MD;  Location: [facility] INVASIVE CV LAB;  Service: Cardiovascular;;   PERIPHERAL VASCULAR BALLOON ANGIOPLASTY  08/26/2017   Procedure: PERIPHERAL VASCULAR BALLOON ANGIOPLASTY;  Surgeon: Yates Decamp, MD;  Location: [facility] INVASIVE CV LAB;  Service: Cardiovascular;;   PERIPHERAL VASCULAR BALLOON ANGIOPLASTY  09/23/2017   Procedure: PERIPHERAL VASCULAR BALLOON ANGIOPLASTY;  Surgeon: Elder Negus, MD;  Location: [facility] INVASIVE CV LAB;  Service: Cardiovascular;;   SKIN CANCER EXCISION     nose- basal cell   TEE WITHOUT CARDIOVERSION N/A 06/17/2016   Procedure: TRANSESOPHAGEAL ECHOCARDIOGRAM (TEE);  Surgeon: Kerin Perna, MD;  Location: [facility] OR;  Service: Open Heart Surgery;  Laterality: N/A;   TONSILLECTOMY      Family History  Problem Relation Age of Onset   Heart disease Father    Pancreatic cancer Brother    Colon cancer Neg Hx    Esophageal cancer Neg Hx    Rectal cancer Neg Hx    Stomach  cancer Neg Hx    Social History:  reports that he quit smoking about 7 years ago. His smoking use included cigarettes. He started smoking about 55 years ago. He has a 72 pack-year smoking history. He has never used smokeless tobacco. He reports that he does not currently use alcohol after a past usage of about 14.0 standard drinks of alcohol per week. He reports that he does not currently use drugs after having used the following drugs: Cocaine.  Allergies: No Known Allergies  Medications Prior to Admission  Medication Sig Dispense Refill   allopurinol (ZYLOPRIM) 100 MG  tablet Take 200 mg by mouth daily.   0   amLODipine (NORVASC) 10 MG tablet Take 10 mg by mouth daily.     aspirin EC 81 MG tablet Take 81 mg by mouth daily. Swallow whole.     atorvastatin (LIPITOR) 80 MG tablet TAKE 1 TABLET(80 MG) BY MOUTH DAILY 90 tablet 3   chlorthalidone (HYGROTON) 25 MG tablet TAKE 1 TABLET(25 MG) BY MOUTH DAILY 90 tablet 3   clopidogrel (PLAVIX) 75 MG tablet Take 75 mg by mouth daily.     docusate sodium (COLACE) 100 MG capsule Take 100 mg by mouth daily.     ezetimibe (ZETIA) 10 MG tablet TAKE 1 TABLET(10 MG) BY MOUTH DAILY 30 tablet 6   LANTUS SOLOSTAR 100 UNIT/ML Solostar Pen Inject 34 Units into the skin at bedtime.     Magnesium Oxide -Mg Supplement 500 MG TABS Take 500 mg by mouth daily.     metFORMIN (GLUCOPHAGE) 500 MG tablet Take 1 tablet (500 mg total) by mouth 2 (two) times daily with a meal. 60 tablet 0   metoprolol tartrate (LOPRESSOR) 50 MG tablet Take 1 tablet (50 mg total) by mouth 2 (two) times daily. 180 tablet 3   OZEMPIC, 0.25 OR 0.5 MG/DOSE, 2 MG/3ML SOPN Inject 0.5 mg into the skin once a week.     tadalafil (CIALIS) 5 MG tablet Take 5 mg by mouth daily.     valsartan (DIOVAN) 160 MG tablet Take 1 tablet (160 mg total) by mouth daily. 90 tablet 3   BD PEN NEEDLE NANO 2ND GEN 32G X 4 MM MISC USE TWICE DAILY WITH LEVEMIR     fluorouracil (EFUDEX) 5 % cream Apply 1 application  topically 2 (two) times daily.     sildenafil (REVATIO) 20 MG tablet Take 1 tablet by mouth as needed.      Results for orders placed or performed during the hospital encounter of 05/15/23 (from the past 48 hours)  Glucose, capillary     Status: Abnormal   Collection Time: 05/15/23  9:58 AM  Result Value Ref Range   Glucose-Capillary 121 (H) 70 - 99 mg/dL    Comment: Glucose reference range applies only to samples taken after fasting for at least 8 hours.  Glucose, capillary     Status: Abnormal   Collection Time: 05/15/23 11:53 AM  Result Value Ref Range    Glucose-Capillary 102 (H) 70 - 99 mg/dL    Comment: Glucose reference range applies only to samples taken after fasting for at least 8 hours.   Comment 1 Notify RN    Comment 2 Document in Chart    No results found.  Review of Systems  All other systems reviewed and are negative.   Blood pressure 138/66, pulse 91, temperature 98 F (36.7 C), resp. rate 18, height 5\' 11"  (1.803 m), weight 94.3 kg, SpO2 98%. Physical Exam Constitutional:  Appearance: Normal appearance. He is normal weight.  HENT:     Head: Normocephalic and atraumatic.     Right Ear: External ear normal.     Left Ear: External ear normal.     Nose: Nose normal.     Mouth/Throat:     Mouth: Mucous membranes are moist.     Pharynx: Oropharynx is clear.  Eyes:     Extraocular Movements: Extraocular movements intact.     Pupils: Pupils are equal, round, and reactive to light.  Cardiovascular:     Rate and Rhythm: Normal rate.  Pulmonary:     Effort: Pulmonary effort is normal.  Musculoskeletal:     Cervical back: Normal range of motion.  Skin:    General: Skin is warm and dry.  Neurological:     General: No focal deficit present.     Mental Status: He is alert and oriented to person, place, and time.  Psychiatric:        Mood and Affect: Mood normal.        Behavior: Behavior normal.        Thought Content: Thought content normal.        Judgment: Judgment normal.      Assessment/Plan Right TM perforation with chronic otorrhea  To OR for right tympanomastoidectomy.  Christia Reading, MD 05/15/2023, 12:20 PM

## 2023-05-15 NOTE — Anesthesia Procedure Notes (Signed)
Procedure Name: Intubation Date/Time: 05/15/2023 12:53 PM  Performed by: Loleta Aasiyah Auerbach, CRNAPre-anesthesia Checklist: Patient identified, Patient being monitored, Timeout performed, Emergency Drugs available and Suction available Patient Re-evaluated:Patient Re-evaluated prior to induction Oxygen Delivery Method: Circle system utilized Preoxygenation: Pre-oxygenation with 100% oxygen Induction Type: IV induction Ventilation: Mask ventilation without difficulty Laryngoscope Size: Mac and 4 Grade View: Grade I Tube type: Oral Tube size: 7.5 mm Number of attempts: 1 Airway Equipment and Method: Stylet Placement Confirmation: ETT inserted through vocal cords under direct vision, positive ETCO2 and breath sounds checked- equal and bilateral Secured at: 23 cm Tube secured with: Tape Dental Injury: Teeth and Oropharynx as per pre-operative assessment

## 2023-05-15 NOTE — Op Note (Signed)
Preop diagnosis: Right tympanic membrane perforation and chronic otorrhea Postop diagnosis: same Procedure: Right tympanomastoidectomy with ossicular chain reconstruction using incus interposition graft Surgeon: Jenne Pane Anesth: General endotracheal anesthesia with local 1% lidocaine with 1:100,000 epinephrine Compl: None Findings: 20% posterior perforation with mucoid otorrhea.  Mastoid restricted with mild inflammation.  Lateral tegmen tympani with low-riding area where dura was found to be exposed, covered with bone pate at the end of the case.  Incus remnant removed and converted to incus interposition graft. Description:  After discussing risks, benefits, and alternatives, the patient was brought to the operative suite and placed on the operative table in the supine position.  Anesthesia was induced and the patient was intubated by the Anesthesia team without difficulty.  The eyes were taped closed and the bed was turned 90 degrees from Anesthesia.  The patient was given IV antibiotics during the case.  The nerve integrity monitor was placed for the right ear and turned on through the case.  The right ear canal was injected with local anesthetic in four quadrants.  The post-auricular site was likewise injected.  Hair behind the ear was shaved back exposing the incision site.  The right ear was then prepped and draped in sterile fashion.  Under the operating microscope, the ear was again inspected with an ear speculum.  The canal was fully cleared of mucoid otorrhea and cerumen.  The perforation was freshened using a pick and cup forceps, removing the rim of the perforation.  Radial canal incisions were made at 11 o'clock and 6 o'clock and the posterior circumferential incision was made.  The tympanomeatal flap was elevated to the annulus.  The post-auricular area was then inspected.  A curved incision was made using a 15 blade and extended through subcutaneous tissues to the mastoid periosteum and  temporalis fascia.  The fascia layer was exposed and then incised above the temporal line.  The fascia was elevated and a rectangular graft was removed with scissors and placed in the fascia press, opening it under a heat lamp after a few minutes.  A T incision was made through the mastoid periosteum and the periosteum was elevated in all directions exposing the mastoid cortex.  This included into the ear canal where the canal incision was met.  A 1/4 inch Penrose drain was placed through the canal and pulled toward the mastoid and was then used to retract the ear anteriorly.  Self-retaining retractors were added.  Under the operating microscope, the mastoid cortex was drilled away using a 5 mm cutting burr.  The sigmoid sinus was skeletonized.  Superiorly, the tegmen tympani was found to ride low laterally and there was exposed dura from the get-go.  The tegmen was skeletonized toward the mastoid antrum as was the posterior canal wall.  The mastoid antrum was exposed and cleared.    At this point, the canal was re-inspected.  The tympanomeatal flap was elevated lifting the annulus out of its groove and exposing the middle ear.  The incus was found to be eroded and no longer in contact with the stapes.  The head of the stapes was cleared of soft tissue.  The stapes was found to be in good mobility.  The corda tympani nerve was moved inferiorly and a mini-atticotomy was performed with a small diamond drill and a curette.  The incus remnant was then removed.  On the back table, the incus remnant was carved using the small diamond burr into an incus interposition graft and it was  then placed between the malleus long handle and the head of the stapes.  The fascia graft was then trimmed and placed into the middle ear, pressing it anteriorly.  Pieces of gelfoam soaked in Ciprodex were then placed into the middle ear and pressed anteriorly, lateralizing the graft against the membrane.  After filling the middle ear,  the graft and flap were laid back into position.  Additional soaked gelfoam was then placed on the lateral membrane and canal incisions.  More soaked gelfoam was placed in the mastoid.  Bone pate that had been collected earlier in the case was then placed over the lateral exposed dura.  The ear was released from retraction.  Periosteum was closed with 3-0 Vicryl.  The subcutaneous layer was closed with 4-0 Vicryl.  The skin was closed with running 5-0 plain gut.  The canal was inspected and looked good.  Drapes were removed and the patient was cleaned off.  A standard mastoid dressing was applied.  The patient was then returned to Anesthesia for wake-up, was extubated, and moved to the recovery room in stable condition.

## 2023-05-15 NOTE — Anesthesia Postprocedure Evaluation (Signed)
Anesthesia Post Note  Patient: Drew Harrison  Procedure(s) Performed: RIGHT TYMPANOMASTOIDECTOMY WITH OSSICULAR CHAIN RECONSTRUCTION (Right)     Patient location during evaluation: PACU Anesthesia Type: General Level of consciousness: awake and alert and oriented Pain management: pain level controlled Vital Signs Assessment: post-procedure vital signs reviewed and stable Respiratory status: spontaneous breathing, nonlabored ventilation and respiratory function stable Cardiovascular status: blood pressure returned to baseline and stable Postop Assessment: no apparent nausea or vomiting Anesthetic complications: no   No notable events documented.  Last Vitals:  Vitals:   05/15/23 1630 05/15/23 1645  BP: (!) 110/57 127/63  Pulse: 86 88  Resp: 13 12  Temp:    SpO2: 95% 95%    Last Pain:  Vitals:   05/15/23 1645  PainSc: 0-No pain                 Shanteria Laye A.

## 2023-05-16 ENCOUNTER — Encounter (HOSPITAL_COMMUNITY): Payer: Self-pay | Admitting: Otolaryngology

## 2023-05-16 ENCOUNTER — Other Ambulatory Visit: Payer: Self-pay

## 2023-05-16 DIAGNOSIS — H7291 Unspecified perforation of tympanic membrane, right ear: Secondary | ICD-10-CM | POA: Diagnosis not present

## 2023-05-16 LAB — GLUCOSE, CAPILLARY: Glucose-Capillary: 177 mg/dL — ABNORMAL HIGH (ref 70–99)

## 2023-05-16 NOTE — Progress Notes (Signed)
Discharge instructions (including medications) discussed with and copy provided to patient/caregiver  Changed patients ear dressing and there are no new medications.

## 2023-05-16 NOTE — Plan of Care (Signed)

## 2023-05-16 NOTE — Discharge Summary (Signed)
Physician Discharge Summary  Patient ID: Drew Harrison MRN: [id number] DOB/AGE: 1951-02-01 73 y.o.  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  See operative note.  He did well overnight with dressing in place.  On POD 1, the dressing was removed and he was felt stable for discharge home.  Consults: None  Significant Diagnostic Studies: None  Treatments: surgery: Right tympanomastoidectomy with ossicular chain reconstruction  Discharge Exam: Blood pressure 131/65, pulse 76, temperature 97.8 F (36.6 C), temperature source Oral, resp. rate 16, height 5\' 11"  (1.803 m), weight 94.3 kg, SpO2 95%. General appearance: alert, cooperative, and no distress Ears: normal facial movement, dressing removed, incision intact, cotton ball in place  Disposition: Discharge disposition: 01-Home or Self Care       Discharge Instructions     Diet - low sodium heart healthy   Complete by: As directed    Discharge instructions   Complete by: As directed    Change ointment-coated cotton ball at least once daily.  Apply ointment to incision behind ear twice daily.  Use cup over ear when showering.  Avoid strenuous activity.  Resume Plavix tomorrow if bleeding is not too bad.  Tylenol and/or Motrin for pain.   Increase activity slowly   Complete by: As directed       Allergies as of 05/16/2023   No Known Allergies      Medication List     TAKE these medications    allopurinol 100 MG tablet Commonly known as: ZYLOPRIM Take 200 mg by mouth daily.   amLODipine 10 MG tablet Commonly known as: NORVASC Take 10 mg by mouth daily.   aspirin EC 81 MG tablet Take 81 mg by mouth  daily. Swallow whole.   atorvastatin 80 MG tablet Commonly known as: LIPITOR TAKE 1 TABLET(80 MG) BY MOUTH DAILY   BD Pen Needle Nano 2nd Gen 32G X 4 MM Misc Generic drug: Insulin Pen Needle USE TWICE DAILY WITH LEVEMIR   chlorthalidone 25 MG tablet Commonly known as: HYGROTON TAKE 1 TABLET(25 MG) BY MOUTH DAILY   clopidogrel 75 MG tablet Commonly known as: PLAVIX Take 75 mg by mouth daily.   docusate sodium 100 MG capsule Commonly known as: COLACE Take 100 mg by mouth daily.   ezetimibe 10 MG tablet Commonly known as: ZETIA TAKE 1 TABLET(10 MG) BY MOUTH DAILY   fluorouracil 5 % cream Commonly known as: EFUDEX Apply 1 application  topically 2 (two) times daily.   Lantus SoloStar 100 UNIT/ML Solostar Pen Generic drug: insulin glargine Inject 34 Units into the skin at bedtime.   Magnesium Oxide -Mg Supplement 500 MG Tabs Take 500 mg by mouth daily.   metFORMIN 500 MG tablet Commonly known as: GLUCOPHAGE Take 1 tablet (500 mg total) by mouth 2 (two) times daily with a meal.   metoprolol tartrate 50 MG tablet Commonly known as: LOPRESSOR Take 1 tablet (50 mg total) by mouth 2 (two) times daily.   Ozempic (0.25 or 0.5 MG/DOSE) 2 MG/3ML Sopn Generic drug: Semaglutide(0.25 or 0.5MG /DOS) Inject 0.5 mg into the skin once a week.   sildenafil 20 MG tablet Commonly known as: REVATIO Take 1 tablet by mouth as needed.   tadalafil 5 MG tablet Commonly known  as: CIALIS Take 5 mg by mouth daily.   valsartan 160 MG tablet Commonly known as: Diovan Take 1 tablet (160 mg total) by mouth daily.        Follow-up Information     Christia Reading, MD. Schedule an appointment as soon as possible for a visit in 2 week(s).   Specialty: Otolaryngology Contact information: [address]  [place] [address] [phone]                 Signed: Christia Reading 05/16/2023, 7:04 AM

## 2023-05-16 NOTE — Plan of Care (Signed)
   Problem: Education: Goal: Knowledge of General Education information will improve Description: Including pain rating scale, medication(s)/side effects and non-pharmacologic comfort measures Outcome: Progressing   Problem: Elimination: Goal: Will not experience complications related to urinary retention Outcome: Progressing   Problem: Pain Managment: Goal: General experience of comfort will improve and/or be controlled Outcome: Progressing

## 2023-06-24 ENCOUNTER — Encounter: Payer: Self-pay | Admitting: Podiatry

## 2023-06-24 ENCOUNTER — Ambulatory Visit: Admitting: Podiatry

## 2023-06-24 VITALS — Ht 71.0 in | Wt 208.0 lb

## 2023-06-24 DIAGNOSIS — E1151 Type 2 diabetes mellitus with diabetic peripheral angiopathy without gangrene: Secondary | ICD-10-CM

## 2023-06-24 DIAGNOSIS — L84 Corns and callosities: Secondary | ICD-10-CM

## 2023-06-24 DIAGNOSIS — L853 Xerosis cutis: Secondary | ICD-10-CM

## 2023-06-24 NOTE — Progress Notes (Unsigned)
 Chief Complaint  Patient presents with   Nail Problem    Pt is here for [facility].   HPI: 73 y.o. male presents today, upon referral from his PCP, for a diabetic foot exam today.  He gets recurring large calluses on the plantar aspect of the forefoot bilateral.  He stated that he did not want them shaved very much, because he anticipates discomfort after they are shaved.  He states he trims his own nails and prefers to continue performing this at home.  He denies neuropathy in his feet.  He is not aware of diabetic shoe program.  He does not apply moisturizer daily and does not wish to start.  Past Medical History:  Diagnosis Date   Abnormal nuclear stress test    Arthritis    BCC (basal cell carcinoma of skin)    Cellulitis and abscess of hand 05/2017   Coronary artery disease    3-vessel   Diabetes mellitus without complication (HCC)    Emphysema lung (HCC)    GERD (gastroesophageal reflux disease)    Gout    Headache    History of tobacco abuse    Hyperlipidemia    Hypertension    Inguinal hernia    right   Ischemic stroke (HCC)    Squamous cell carcinoma of scalp    Stroke (HCC) 03/29/2016   left facial droop   Stroke due to embolism [facility])    Visual disturbance     Past Surgical History:  Procedure Laterality Date   BASAL CELL CARCINOMA EXCISION  2024   right calf   CATARACT EXTRACTION     CORONARY ARTERY BYPASS GRAFT N/A 06/17/2016   Procedure: CORONARY ARTERY BYPASS GRAFTING (CABG) x four using left internal mammary artery and right greater saphenous leg vein using endoscope.;  Surgeon: Kerin Perna, MD;  Location: [facility] OR;  Service: Open Heart Surgery;  Laterality: N/A;   LEFT HEART CATH AND CORONARY ANGIOGRAPHY N/A 05/14/2016   Procedure: Left Heart Cath and Coronary Angiography;  Surgeon: Yates Decamp, MD;  Location: [facility] INVASIVE CV LAB;  Service: Cardiovascular;  Laterality: N/A;   LOWER EXTREMITY ANGIOGRAPHY N/A 01/21/2017   Procedure: Lower Extremity Angiography;   Surgeon: Yates Decamp, MD;  Location: [facility] INVASIVE CV LAB;  Service: Cardiovascular;  Laterality: N/A;   LOWER EXTREMITY ANGIOGRAPHY Left 08/26/2017   Procedure: LOWER EXTREMITY ANGIOGRAPHY;  Surgeon: Yates Decamp, MD;  Location: [facility] INVASIVE CV LAB;  Service: Cardiovascular;  Laterality: Left;   LOWER EXTREMITY ANGIOGRAPHY Right 09/23/2017   Procedure: LOWER EXTREMITY ANGIOGRAPHY;  Surgeon: Elder Negus, MD;  Location: [facility] INVASIVE CV LAB;  Service: Cardiovascular;  Laterality: Right;   MOHS SURGERY     Dec 2023   MOHS SURGERY  03/2023   scalp   PERIPHERAL VASCULAR ATHERECTOMY  08/26/2017   Procedure: PERIPHERAL VASCULAR ATHERECTOMY;  Surgeon: Yates Decamp, MD;  Location: [facility] INVASIVE CV LAB;  Service: Cardiovascular;;   PERIPHERAL VASCULAR ATHERECTOMY  09/23/2017   Procedure: PERIPHERAL VASCULAR ATHERECTOMY;  Surgeon: Elder Negus, MD;  Location: [facility] INVASIVE CV LAB;  Service: Cardiovascular;;   PERIPHERAL VASCULAR BALLOON ANGIOPLASTY  08/26/2017   Procedure: PERIPHERAL VASCULAR BALLOON ANGIOPLASTY;  Surgeon: Yates Decamp, MD;  Location: [facility] INVASIVE CV LAB;  Service: Cardiovascular;;   PERIPHERAL VASCULAR BALLOON ANGIOPLASTY  09/23/2017   Procedure: PERIPHERAL VASCULAR BALLOON ANGIOPLASTY;  Surgeon: Elder Negus, MD;  Location: [facility] INVASIVE CV LAB;  Service: Cardiovascular;;   SKIN CANCER EXCISION  nose- basal cell   TEE WITHOUT CARDIOVERSION N/A 06/17/2016   Procedure: TRANSESOPHAGEAL ECHOCARDIOGRAM (TEE);  Surgeon: Kerin Perna, MD;  Location: [facility] OR;  Service: Open Heart Surgery;  Laterality: N/A;   TONSILLECTOMY     TYMPANOMASTOIDECTOMY Right 05/15/2023   Procedure: RIGHT TYMPANOMASTOIDECTOMY WITH OSSICULAR CHAIN RECONSTRUCTION;  Surgeon: Christia Reading, MD;  Location: [facility] OR;  Service: ENT;  Laterality: Right;   No Known Allergies   Physical Exam: General: The patient is alert and oriented x3 in no acute distress.  Dermatology: Skin is warm, moderately dry, and  supple bilateral lower extremities. Interspaces are clear of maceration and debris.  Nails are minimally elongated.  There are moderate hyperkeratotic lesions submet 5 bilateral, no ulceration is noted  Vascular: Palpable pedal pulses bilaterally. Capillary refill within normal limits.  No appreciable edema.  No erythema or calor.  Neurological: Light touch sensation grossly intact bilateral feet.  Protective sensation intact using a Semmes Weinstein monofilament bilateral.  Temperature sensation intact.  Vibratory sensation is slightly delayed bilateral first MPJ  Assessment/Plan of Care: 1. Type II diabetes mellitus with peripheral circulatory disorder (HCC)   2. Callus of foot   3. Xerosis of skin    Discussed clinical findings with patient today.  With the patient's consent the hyperkeratotic lesions on the plantar aspect of the fifth metatarsal head were shaved with a sterile #313 blade.  The patient did not want them shaved fully so they were not.  Informed him that the thicker the calluses get, he does put himself at risk for eventual ulceration underneath the callus.  I recommended diabetic shoes with custom diabetic insoles to offload the fifth metatarsal heads.  This can help prevent callus formation and ultimately ulcer formation.  Patient declined scheduling appointment for this today.  Recommended daily moisturization of the skin.    Follow-up as needed at patient's request   Angellee Cohill Orland Mustard, DPM, FACFAS [facility]     [address] [place], [place] [address]                Office [phone]  Fax [phone]

## 2023-07-31 ENCOUNTER — Other Ambulatory Visit: Payer: Self-pay | Admitting: Family Medicine

## 2023-07-31 DIAGNOSIS — K760 Fatty (change of) liver, not elsewhere classified: Secondary | ICD-10-CM

## 2023-08-05 ENCOUNTER — Ambulatory Visit
Admission: RE | Admit: 2023-08-05 | Discharge: 2023-08-05 | Disposition: A | Discharge status: Home / Self Care | Source: Ambulatory Visit | Attending: Family Medicine | Admitting: Family Medicine

## 2023-08-05 DIAGNOSIS — K760 Fatty (change of) liver, not elsewhere classified: Secondary | ICD-10-CM

## 2023-08-20 ENCOUNTER — Ambulatory Visit: Payer: PPO | Admitting: Cardiology

## 2023-08-21 ENCOUNTER — Ambulatory Visit: Attending: Cardiology | Admitting: Cardiology

## 2023-08-21 ENCOUNTER — Telehealth: Payer: Self-pay | Admitting: Gastroenterology

## 2023-08-21 ENCOUNTER — Encounter: Payer: Self-pay | Admitting: Cardiology

## 2023-08-21 ENCOUNTER — Other Ambulatory Visit: Payer: Self-pay | Admitting: Internal Medicine

## 2023-08-21 VITALS — BP 134/76 | HR 83 | Ht 71.0 in | Wt 200.0 lb

## 2023-08-21 DIAGNOSIS — I739 Peripheral vascular disease, unspecified: Secondary | ICD-10-CM | POA: Diagnosis not present

## 2023-08-21 DIAGNOSIS — I10 Essential (primary) hypertension: Secondary | ICD-10-CM

## 2023-08-21 DIAGNOSIS — I2510 Atherosclerotic heart disease of native coronary artery without angina pectoris: Secondary | ICD-10-CM | POA: Diagnosis not present

## 2023-08-21 DIAGNOSIS — Z87891 Personal history of nicotine dependence: Secondary | ICD-10-CM

## 2023-08-21 DIAGNOSIS — E785 Hyperlipidemia, unspecified: Secondary | ICD-10-CM | POA: Diagnosis not present

## 2023-08-21 NOTE — Telephone Encounter (Signed)
 Good afternoon Dr. General Kenner,   We received a referral for patient to be seen for advanced liver disease. Patient is requesting to transfer his care over to Dr. Rosaline Coma due to being referred directly due to Dr. Rosaline Coma specializing in hepatology. Please review and advise on this transfer of care request.   Thank you.

## 2023-08-21 NOTE — Patient Instructions (Signed)
 Medication Instructions:  Stop aspirin  *If you need a refill on your cardiac medications before your next appointment, please call your pharmacy*  Lab Work: none If you have labs (blood work) drawn today and your tests are completely normal, you will receive your results only by: MyChart Message (if you have MyChart) OR A paper copy in the mail If you have any lab test that is abnormal or we need to change your treatment, we will call you to review the results.  Testing/Procedures: none  Follow-Up: At [facility], you and your health needs are our priority.  As part of our continuing mission to provide you with exceptional heart care, our providers are all part of one team.  This team includes your primary Cardiologist (physician) and Advanced Practice Providers or APPs (Physician Assistants and Nurse Practitioners) who all work together to provide you with the care you need, when you need it.  Your next appointment:   12 month(s)  Provider:   Knox Perl, MD    We recommend signing up for the patient portal called "MyChart".  Sign up information is provided on this After Visit Summary.  MyChart is used to connect with patients for Virtual Visits (Telemedicine).  Patients are able to view lab/test results, encounter notes, upcoming appointments, etc.  Non-urgent messages can be sent to your provider as well.   To learn more about what you can do with MyChart, go to [email or web address].   Other Instructions

## 2023-08-21 NOTE — Progress Notes (Signed)
 Cardiology Office Note:  .   Date:  08/21/2023  ID:  Daralene Earing, DOB Dec 07, 1950, MRN [id number] PCP: Lazoff, Shawn P, DO  [facility] Providers Cardiologist:  Knox Perl, MD   History of Present Illness: Drew Harrison   CHARVEZ VOORHIES is a 73 y.o. with coronary artery disease s/p CABGX4 with LIMA to LAD, SVG to D1, SVG to RI and SVG to right PDA on 06/17/2016, post op CVA with no significant neurodeficit, hypertension, type 2 DM with vascular complications, severe LE PAD with bilateral small vessel angioplasty involving TP trunk in 2019.  He has a normal LVEF by echocardiogram on 09/12/2021.  Discussed the use of AI scribe software for clinical note transcription with the patient, who gave verbal consent to proceed.  History of Present Illness LOGON UTTECH "Drew Harrison" is a 73 year old male with coronary artery disease and peripheral arterial disease who presents for a cardiovascular follow-up.  He underwent bypass surgery in 2018 for coronary artery disease and is currently on a regimen of baby aspirin , atorvastatin , Zetia , and metoprolol  tartrate. His LDL is well controlled at 58. He has peripheral arterial disease with previous angioplasty in both legs and is asymptomatic while on Plavix . He maintains an active lifestyle, including walking, climbing stairs, and canoeing. His diabetes is well controlled with an A1c of 5.9%. He quit smoking in 2018 and drinking over two years ago, contributing positively to his cardiovascular health. He is satisfied with his lifestyle changes and his impact on his health.  Labs   External Labs:  Care Everywhere PCP labs 06/19/2023:  Sodium 135, potassium 3.8, serum glucose 86 mg, BUN 17, creatinine 1.06, EGFR 75 mL, LFTs normal.  A1c 5.9%.  Hb 15.4/HCT 45.0, platelets 148.  Total cholesterol 117, triglycerides 97, HDL 41, LDL 58.  TSH 11/14/2020: Normal at 2.56.  ROS  Review of Systems  Cardiovascular:  Negative for chest pain, dyspnea on exertion  and leg swelling.    Physical Exam:   VS:  BP 134/76 (BP Location: Left Arm, Patient Position: Sitting, Cuff Size: Large)   Pulse 83   Ht 5\' 11"  (1.803 m)   Wt 200 lb (90.7 kg)   SpO2 95%   BMI 27.89 kg/m    Wt Readings from Last 3 Encounters:  08/21/23 200 lb (90.7 kg)  06/24/23 208 lb (94.3 kg)  05/15/23 208 lb (94.3 kg)    Physical Exam Neck:     Vascular: No carotid bruit or JVD.  Cardiovascular:     Rate and Rhythm: Normal rate and regular rhythm.     Pulses:          Dorsalis pedis pulses are 1+ on the right side and 0 on the left side.       Posterior tibial pulses are 2+ on the right side and 0 on the left side.     Heart sounds: Normal heart sounds. No murmur heard.    No gallop.  Pulmonary:     Effort: Pulmonary effort is normal.     Breath sounds: Normal breath sounds.  Abdominal:     General: Bowel sounds are normal.     Palpations: Abdomen is soft.  Musculoskeletal:     Right lower leg: No edema.     Left lower leg: No edema.    Studies Reviewed: Drew Harrison    Coronary angiogram 05/13/2016: Normal LVEF. RCA ocluded. LAD 3 tandem prox to mid aneurysmal dilatation with 90% stenosis in the mid LAD. Smooth after  stenosis. D1 prox 80%. Cx Occluded mid. RI-2 prox to mid 80%.     CABGX4 with LIMA to LAD, SVG to D1, SVG to RI and SVG to right PDA on 06/17/2016   EKG:    EKG Interpretation Date/Time:  Thursday Aug 21 2023 10:17:50 EDT Ventricular Rate:  83 PR Interval:  278 QRS Duration:  112 QT Interval:  402 QTC Calculation: 472 R Axis:   79  Text Interpretation: EKG 08/21/2023: Sinus rhythm with first-degree AV block at the rate of 83 bpm, inferior infarct old.  Poor R wave progression, cannot exclude anteroseptal infarct old.  LVH with repolarization abnormality, cannot exclude inferior and lateral ischemia.  Single PVC.  Compared to 08/17/2021, no change. Confirmed by Elbia Paro, Jagadeesh ([id number]) on 08/21/2023 10:37:21 AM    Medications and allergies    No Known  Allergies   Current Outpatient Medications:    allopurinol  (ZYLOPRIM ) 100 MG tablet, Take 200 mg by mouth daily. , Disp: , Rfl: 0   amLODipine  (NORVASC ) 10 MG tablet, Take 10 mg by mouth daily., Disp: , Rfl:    atorvastatin  (LIPITOR) 80 MG tablet, TAKE 1 TABLET(80 MG) BY MOUTH DAILY, Disp: 90 tablet, Rfl: 3   BD PEN NEEDLE NANO 2ND GEN 32G X 4 MM MISC, USE TWICE DAILY WITH LEVEMIR , Disp: , Rfl:    chlorthalidone  (HYGROTON ) 25 MG tablet, TAKE 1 TABLET(25 MG) BY MOUTH DAILY, Disp: 90 tablet, Rfl: 3   clopidogrel  (PLAVIX ) 75 MG tablet, Take 75 mg by mouth daily., Disp: , Rfl:    docusate sodium  (COLACE) 100 MG capsule, Take 100 mg by mouth daily., Disp: , Rfl:    ezetimibe  (ZETIA ) 10 MG tablet, TAKE 1 TABLET(10 MG) BY MOUTH DAILY, Disp: 30 tablet, Rfl: 6   fluorouracil (EFUDEX) 5 % cream, Apply 1 application  topically 2 (two) times daily., Disp: , Rfl:    LANTUS  SOLOSTAR 100 UNIT/ML Solostar Pen, Inject 34 Units into the skin at bedtime., Disp: , Rfl:    Magnesium  Oxide -Mg Supplement 500 MG TABS, Take 500 mg by mouth daily., Disp: , Rfl:    metoprolol  tartrate (LOPRESSOR ) 50 MG tablet, Take 1 tablet (50 mg total) by mouth 2 (two) times daily., Disp: 180 tablet, Rfl: 3   OZEMPIC, 0.25 OR 0.5 MG/DOSE, 2 MG/3ML SOPN, Inject 0.5 mg into the skin once a week., Disp: , Rfl:    sildenafil (REVATIO) 20 MG tablet, Take 1 tablet by mouth as needed., Disp: , Rfl:    tadalafil (CIALIS) 5 MG tablet, Take 5 mg by mouth daily., Disp: , Rfl:    valsartan  (DIOVAN ) 160 MG tablet, Take 1 tablet (160 mg total) by mouth daily., Disp: 90 tablet, Rfl: 3   No orders of the defined types were placed in this encounter.    Medications Discontinued During This Encounter  Medication Reason   aspirin  EC 81 MG tablet Discontinued by provider     ASSESSMENT AND PLAN: .      ICD-10-CM   1. Atherosclerosis of native coronary artery of native heart without angina pectoris  I25.10 EKG 12-Lead    2. PAD (peripheral  artery disease) (HCC)  I73.9 EKG 12-Lead    3. Essential hypertension  I10 EKG 12-Lead    4. Hyperlipidemia LDL goal <70  E78.5 EKG 12-Lead      Assessment and Plan Assessment & Plan Atherosclerosis of coronary arteries with history of bypass Coronary artery disease with bypass surgery in 2018. Well-controlled cholesterol levels with LDL at 58.  Medications include atorvastatin  80 mg daily, Zetia  10 mg daily, and metoprolol  tartrate 50 mg twice daily. Aspirin  discontinued due to Plavix  use. - Continue atorvastatin  80 mg daily - Continue Zetia  10 mg daily - Continue metoprolol  tartrate 50 mg twice daily - Stop aspirin  as he is on Plavix   Peripheral arterial disease Peripheral arterial disease with previous angioplasty to both legs. Asymptomatic with good blood flow and capillary refill time less than two seconds. No further intervention required. - Continue Plavix   Type 2 diabetes mellitus, well controlled Type 2 diabetes mellitus, well controlled with A1c at 5.9%. Currently on Ozempic for weight management and diabetes control. Advised on dietary changes to enhance the effectiveness of Ozempic and emphasized the importance of behavioral changes in eating habits for long-term weight management and diabetes control. - Continue Ozempic - Implement dietary changes: eat slowly, consume half portions with a 10-minute break before finishing meal  Fatty liver disease Fatty liver disease with hepatic steatosis. Referred to a specialist at [facility] for further evaluation and management. Advised on dietary changes to aid in weight management and liver health. - Implement dietary changes: eat slowly, consume half portions with a 10-minute break before finishing meal   Signed,  Knox Perl, MD, [facility] 08/21/2023, 10:51 AM [facility] [address] [place], [place] [address] Phone: [phone]. Fax:  [phone]

## 2023-08-22 ENCOUNTER — Encounter: Payer: Self-pay | Admitting: Cardiology

## 2023-08-25 NOTE — Telephone Encounter (Signed)
 Dr. General Kenner,  FYI: I spoke with patient and he agreed to continue care with you.  He said it was "nothing personal with you."  He is scheduled for 11/10/23 and placed on cancellation list.  He declined to schedule with PA.  Please advise if he needs a sooner appt?  Thanks [employer]

## 2023-08-26 NOTE — Telephone Encounter (Signed)
 Thanks Christy - August visit is fine. Thanks

## 2023-09-08 ENCOUNTER — Telehealth: Payer: Self-pay

## 2023-09-08 NOTE — Telephone Encounter (Signed)
 Called and spoke to patient.  Offered him an appointment with Dr. General Kenner for tomorrow since he had a cancellation.  Patient is unable to come tomorrow

## 2023-11-10 ENCOUNTER — Encounter: Payer: Self-pay | Admitting: Gastroenterology

## 2023-11-10 ENCOUNTER — Other Ambulatory Visit (INDEPENDENT_AMBULATORY_CARE_PROVIDER_SITE_OTHER)

## 2023-11-10 ENCOUNTER — Ambulatory Visit (INDEPENDENT_AMBULATORY_CARE_PROVIDER_SITE_OTHER): Admitting: Gastroenterology

## 2023-11-10 VITALS — BP 132/60 | HR 60 | Ht 71.0 in | Wt 197.0 lb

## 2023-11-10 DIAGNOSIS — F1091 Alcohol use, unspecified, in remission: Secondary | ICD-10-CM | POA: Diagnosis not present

## 2023-11-10 DIAGNOSIS — K760 Fatty (change of) liver, not elsewhere classified: Secondary | ICD-10-CM

## 2023-11-10 DIAGNOSIS — R932 Abnormal findings on diagnostic imaging of liver and biliary tract: Secondary | ICD-10-CM | POA: Diagnosis not present

## 2023-11-10 NOTE — Progress Notes (Signed)
 HPI :  73 y/o male with s history of colon polyps, CAD s/p CABG 2018, ischemic CVA in 2018, PVD, on Plavix , here for reassessment for abnormal liver imaging.   I last saw him in 04/2022 for his colonoscopy.   He tells me he has no known history of liver disease.  He states he had routine blood work by his primary care which led him to have a right upper quadrant ultrasound with elastography performed.  He has no prior dedicated imaging of his liver in the system.  Elastography and right upper quadrant ultrasound were done on May 13, showing fatty liver disease and a Median kPa: 24.7 concerning for advanced liver disease.  He is rather surprised by this and has a lot of questions about the findings.  When asked about his alcohol use he does endorse history of heavy alcohol use in the past.  Previously was drinking roughly 4-6 beers per day for many years.  For the past 3 years she has been completely abstinent from alcohol.  He also has a history of diabetes and his BMI over the past year was over 30.  He was placed on Ozempic and states he is lost about 20 pounds over the past 4 to 5 months on the regimen.  His BMI is currently 27.4 and weighs 197 pounds.  He denies any history of jaundice, no edema, no ascites, no history of known esophageal varices (last EGD in 2018), no history of known hepatic encephalopathy.  Lab work reviewed from his primary care which has been done over the past few months.  He has normal liver enzymes, AST and ALT, alk phos and bili have historically been within normal limit.  INR checked in March was 1.1.  His platelet count has historically been normal, most recently 161 in May.  He had an extensive workup for chronic liver disease based on review of the chart which was negative.  Negative workup for autoimmune hepatitis, alpha-1 antitrypsin deficiency, Wilson's, hepatitis B, hemochromatosis, PBC, celiac disease.  He was found to lack immunity to hepatitis A and B and was  given that vaccine, Twinrix, by his primary care.   He otherwise feels well without complaints.  No family history of liver disease.  He does drink coffee routinely.  I do not see hepatitis C testing that is been done in the past.   Prior work-up: EGD 02/21/2017 -  - A 4 cm hiatal hernia was present. - One very mild benign-appearing, widely patent intrinsic stenosis was found 40 cm from the incisors. No dilation performed given no symptoms of dysphagia and that it is widely patent. - The exam of the esophagus was otherwise normal. No evidence of Barrett's esophagus - The entire examined stomach was normal. Biopsies were taken from the antrum, body, and incisura with a cold forceps for Helicobacter pylori testing given the findings of duodenitis as outlined below. - Diffuse inflammation was found in the duodenal bulb, with some erosions, and polypoid erythema. Biopsies were taken with a cold forceps for histology, ensure no adenomatous changes. - The exam of the duodenum was otherwise normal.   Colonoscopy 02/21/2017 - The perianal and digital rectal examinations were normal. - A 4 mm polyp was found in the cecum. The polyp was sessile. The polyp was removed with a cold snare. Resection and retrieval were complete. - Two sessile polyps were found in the sigmoid colon. The polyps were 4 to 5 mm in size. These polyps were removed with  a cold snare. Resection and retrieval were complete. - Many small and large-mouthed diverticula were found in the sigmoid colon. - Internal hemorrhoids were found during retroflexion. - The exam was otherwise without abnormality.   1. Surgical [P], sigmoid and cecum, polyp (2) - TUBULAR ADENOMA(S). - HIGH GRADE DYSPLASIA IS NOT IDENTIFIED. 2. Surgical [P], duodenal bulb - BENIGN POLYPOID SMALL BOWEL-TYPE MUCOSA WITH BRUNNER GLAND HYPERPLASIA. - THERE IS NO EVIDENCE OF MALIGNANCY. 3. Surgical [P], gastric antrum and gastric body - CHRONIC INACTIVE  GASTRITIS. - THERE IS NO EVIDENCE OF HELICOBACTER PYLORI, DYSPLASIA, OR MALIGNANCY. - SEE COMMENT.     Echo 09/12/21: EF 55-60%, grade I MR     Colonoscopy 05/10/22: - The perianal and digital rectal examinations were normal. - Multiple medium-mouthed diverticula were found in the sigmoid colon. - Internal hemorrhoids were found during retroflexion. - The exam was otherwise without abnormality.  No further surveillance recommended due to no polyps / high risk lesions on the last exam   RUQ US  with elastography 08/05/23: FINDINGS: ULTRASOUND ABDOMEN LIMITED RIGHT UPPER QUADRANT   Gallbladder:   No gallstones or wall thickening visualized. No sonographic Murphy sign noted.   Common bile duct:   Diameter: 3.4 mm   Liver:   No focal lesion. Mild increased echotexture of liver. Portal vein is patent on color Doppler imaging with normal direction of blood flow towards the liver.   ULTRASOUND HEPATIC ELASTOGRAPHY   Device: Siemens Helix VTQ   Patient position: Oblique   Transducer 6C1   Number of measurements: 10   Hepatic segment:  8   Median kPa: 24.7   IQR: 6.2   IQR/Median kPa ratio: 0.2   Data quality:  Good   Diagnostic category: > or =17 kPa: highly suggestive of cACLD with an increased probability of clinically significant portal hypertension   The use of hepatic elastography is applicable to patients with viral hepatitis and non-alcoholic fatty liver disease. At this time, there is insufficient data for the referenced cut-off values and use in other causes of liver disease, including alcoholic liver disease. Patients, however, may be assessed by elastography and serve as their own reference standard/baseline.   In patients with non-alcoholic liver disease, the values suggesting compensated advanced chronic liver disease (cACLD) may be lower, and patients may need additional testing with elasticity results of 7-9 kPa.   Please note that abnormal  hepatic elasticity and shear wave velocities may also be identified in clinical settings other than with hepatic fibrosis, such as: acute hepatitis, elevated right heart and central venous pressures including use of beta blockers, veno-occlusive disease (Budd-Chiari), infiltrative processes such as mastocytosis/amyloidosis/infiltrative tumor/lymphoma, extrahepatic cholestasis, with hyperemia in the post-prandial state, and with liver transplantation. Correlation with patient history, laboratory data, and clinical condition recommended.   Diagnostic Categories:   < or =5 kPa: high probability of being normal   < or =9 kPa: in the absence of other known clinical signs, rules out cACLD   >9 kPa and ?13 kPa: suggestive of cACLD, but needs further testing   >13 kPa: highly suggestive of cACLD   > or =17 kPa: highly suggestive of cACLD with an increased probability of clinically significant portal hypertension   IMPRESSION: ULTRASOUND RUQ:   Fatty infiltration of liver.   ULTRASOUND HEPATIC ELASTOGRAPHY:   Median kPa:  24.7   Diagnostic category: > or =17 kPa: highly suggestive of cACLD with an increased probability of clinically significant portal Hypertension   Past Medical History:  Diagnosis Date  Abnormal nuclear stress test    Arthritis    BCC (basal cell carcinoma of skin)    Cellulitis and abscess of hand 05/2017   Coronary artery disease    3-vessel   Diabetes mellitus without complication (HCC)    Emphysema lung (HCC)    GERD (gastroesophageal reflux disease)    Gout    Headache    History of tobacco abuse    Hyperlipidemia    Hypertension    Inguinal hernia    right   Ischemic stroke (HCC)    Squamous cell carcinoma of scalp    Stroke (HCC) 03/29/2016   left facial droop   Stroke due to embolism [facility])    Visual disturbance      Past Surgical History:  Procedure Laterality Date   BASAL CELL CARCINOMA EXCISION  2024   right calf   CATARACT  EXTRACTION     CORONARY ARTERY BYPASS GRAFT N/A 06/17/2016   Procedure: CORONARY ARTERY BYPASS GRAFTING (CABG) x four using left internal mammary artery and right greater saphenous leg vein using endoscope.;  Surgeon: Maude Fleeta Ochoa, MD;  Location: [facility] OR;  Service: Open Heart Surgery;  Laterality: N/A;   LEFT HEART CATH AND CORONARY ANGIOGRAPHY N/A 05/14/2016   Procedure: Left Heart Cath and Coronary Angiography;  Surgeon: Gordy Bergamo, MD;  Location: [facility] INVASIVE CV LAB;  Service: Cardiovascular;  Laterality: N/A;   LOWER EXTREMITY ANGIOGRAPHY N/A 01/21/2017   Procedure: Lower Extremity Angiography;  Surgeon: Bergamo Gordy, MD;  Location: [facility] INVASIVE CV LAB;  Service: Cardiovascular;  Laterality: N/A;   LOWER EXTREMITY ANGIOGRAPHY Left 08/26/2017   Procedure: LOWER EXTREMITY ANGIOGRAPHY;  Surgeon: Bergamo Gordy, MD;  Location: [facility] INVASIVE CV LAB;  Service: Cardiovascular;  Laterality: Left;   LOWER EXTREMITY ANGIOGRAPHY Right 09/23/2017   Procedure: LOWER EXTREMITY ANGIOGRAPHY;  Surgeon: Elmira Newman PARAS, MD;  Location: [facility] INVASIVE CV LAB;  Service: Cardiovascular;  Laterality: Right;   MOHS SURGERY     Dec 2023   MOHS SURGERY  03/2023   scalp   PERIPHERAL VASCULAR ATHERECTOMY  08/26/2017   Procedure: PERIPHERAL VASCULAR ATHERECTOMY;  Surgeon: Bergamo Gordy, MD;  Location: [facility] INVASIVE CV LAB;  Service: Cardiovascular;;   PERIPHERAL VASCULAR ATHERECTOMY  09/23/2017   Procedure: PERIPHERAL VASCULAR ATHERECTOMY;  Surgeon: Elmira Newman PARAS, MD;  Location: [facility] INVASIVE CV LAB;  Service: Cardiovascular;;   PERIPHERAL VASCULAR BALLOON ANGIOPLASTY  08/26/2017   Procedure: PERIPHERAL VASCULAR BALLOON ANGIOPLASTY;  Surgeon: Bergamo Gordy, MD;  Location: [facility] INVASIVE CV LAB;  Service: Cardiovascular;;   PERIPHERAL VASCULAR BALLOON ANGIOPLASTY  09/23/2017   Procedure: PERIPHERAL VASCULAR BALLOON ANGIOPLASTY;  Surgeon: Elmira Newman PARAS, MD;  Location: [facility] INVASIVE CV LAB;  Service: Cardiovascular;;   SKIN  CANCER EXCISION     nose- basal cell   TEE WITHOUT CARDIOVERSION N/A 06/17/2016   Procedure: TRANSESOPHAGEAL ECHOCARDIOGRAM (TEE);  Surgeon: Maude Fleeta Ochoa, MD;  [facility];  Service: Open Heart Surgery;  Laterality: N/A;   TONSILLECTOMY     TYMPANOMASTOIDECTOMY Right 05/15/2023   Procedure: RIGHT TYMPANOMASTOIDECTOMY WITH OSSICULAR CHAIN RECONSTRUCTION;  Surgeon: Carlie Clark, MD;  Location: [facility] OR;  Service: ENT;  Laterality: Right;   Family History  Problem Relation Age of Onset   Heart disease Father    Pancreatic cancer Brother    Colon cancer Neg Hx    Esophageal cancer Neg Hx    Rectal cancer Neg Hx    Stomach cancer Neg Hx    Social History   Tobacco Use  Smoking status: Former    Current packs/day: 0.00    Average packs/day: 1.5 packs/day for 48.0 years (72.0 ttl pk-yrs)    Types: Cigarettes    Start date: 03/29/1968    Quit date: 03/29/2016    Years since quitting: 7.6   Smokeless tobacco: Never   Tobacco comments:    Quit smoking 03/29/2016  Vaping Use   Vaping status: Never Used  Substance Use Topics   Alcohol use: Not Currently    Alcohol/week: 14.0 standard drinks of alcohol    Types: 14 Cans of beer per week    Comment: Quit end of October 2022   Drug use: Not Currently    Types: Cocaine    Comment: last usage 12 yrs.   Current Outpatient Medications  Medication Sig Dispense Refill   allopurinol  (ZYLOPRIM ) 100 MG tablet Take 200 mg by mouth daily.   0   amLODipine  (NORVASC ) 10 MG tablet Take 10 mg by mouth daily.     atorvastatin  (LIPITOR) 80 MG tablet TAKE 1 TABLET(80 MG) BY MOUTH DAILY 90 tablet 3   BD PEN NEEDLE NANO 2ND GEN 32G X 4 MM MISC USE TWICE DAILY WITH LEVEMIR      clopidogrel  (PLAVIX ) 75 MG tablet Take 75 mg by mouth daily.     docusate sodium  (COLACE) 100 MG capsule Take 100 mg by mouth daily.     ezetimibe  (ZETIA ) 10 MG tablet TAKE 1 TABLET(10 MG) BY MOUTH DAILY 30 tablet 6   LANTUS  SOLOSTAR 100 UNIT/ML Solostar Pen Inject 34 Units into  the skin at bedtime.     Magnesium  Oxide -Mg Supplement 500 MG TABS Take 500 mg by mouth daily.     metoprolol  tartrate (LOPRESSOR ) 50 MG tablet Take 1 tablet (50 mg total) by mouth 2 (two) times daily. 180 tablet 3   OZEMPIC, 0.25 OR 0.5 MG/DOSE, 2 MG/3ML SOPN Inject 0.5 mg into the skin once a week. (Patient taking differently: Inject 1 mg into the skin once a week.)     tadalafil (CIALIS) 5 MG tablet Take 5 mg by mouth daily.     valsartan  (DIOVAN ) 160 MG tablet Take 1 tablet (160 mg total) by mouth daily. 90 tablet 3   vitamin B-12 (CYANOCOBALAMIN) 50 MCG tablet Take 50 mcg by mouth daily.     chlorthalidone  (HYGROTON ) 25 MG tablet TAKE 1 TABLET(25 MG) BY MOUTH DAILY (Patient not taking: Reported on 11/10/2023) 90 tablet 3   fluorouracil (EFUDEX) 5 % cream Apply 1 application  topically 2 (two) times daily.     sildenafil (REVATIO) 20 MG tablet Take 1 tablet by mouth as needed. (Patient not taking: Reported on 11/10/2023)     No current facility-administered medications for this visit.   No Known Allergies   Review of Systems: All systems reviewed and negative except where noted in HPI.   Lab workup in Care everywhere   Physical Exam: BP 132/60   Pulse 60   Ht 5' 11 (1.803 m)   Wt 197 lb (89.4 kg)   BMI 27.48 kg/m  Constitutional: Pleasant,well-developed, male in no acute distress. Abdominal: Soft, nondistended, nontender. There are no masses palpable. No hepatomegaly. Extremities: no edema Neurological: Alert and oriented to person place and time. Psychiatric: Normal mood and affect. Behavior is normal.   ASSESSMENT: 73 y.o. male here for assessment of the following  1. Abnormal liver diagnostic imaging   2. Fatty liver    Historically has had normal liver enzymes.  History of heavy alcohol use  years ago, stopped drinking completely 3 years ago.  Also with history of diabetes and BMI over 30, at risk for metabolic syndrome MASLD.  Ultrasound with elastography shows  hepatic steatosis, median kPA of 24.7, suggestive of fibrotic change.  No overt cirrhosis on imaging.  His platelets are normal.  We discussed hepatic steatosis.  Likely combination from his prior alcohol use, and component of MASLD.  We discussed risks of this over time to include fibrotic change and cirrhosis.  At this point in time I do not see any obvious cirrhosis however certainly possible he has developed some fibrotic change.  Discussed limitations of elastography and options how to clarify if he has fibrotic change or cirrhosis.  We reviewed options to include FibroScan, cross-sectional imaging with CT/MRI, or liver biopsy depending on how aggressive he want to be with this.  I think FibroScan is certainly reasonable next step to help clarify the elastography finding, it sounds like he has that scheduled at [facility] in the next week or 2 and agree he should proceed with that.  Once we have that back we can discuss next steps.  We did discuss cirrhosis in general, if he has this, risks for decompensation and HCC over time.  If he has advanced fibrotic change would likely screen him for [facility] every 6 months.  His workup for chronic liver disease otherwise appears negative, the only lab I do not see is screening for hepatitis C.  Will send him to the lab for that today.  Otherwise recommend continued alcohol abstinence, he endorses abstinence for the past 3 years.  Encouraged liberal coffee intake which can help prevent fibrotic change, he is already doing this.  He should continue weight loss and we will stay on Ozempic for his diabetes and weight.  I will see him again in 6 months but plan on having a conversation with him sooner once results of FibroScan are back.    PLAN: - proceed with fibroscan as scheduled, I told him to contact me once that is done so I can review it with him - lab for hep C antibody - continue alcohol abstinence - encouraged routine coffee intake - continue Ozempic,  work on weight loss, diet / exercise - consider surveillance RUQ US  in 6 months pending course - discussed fatty liver, risks for fibrosis, risks of cirrhosis, HCC etc. Hopefully fibroscan can help clarify, also consider cross section imaging pending his course, hopefully  can avoid a liver biopsy - f/u 6 months or sooner with issues  Marcey Naval, MD [facility]

## 2023-11-10 NOTE — Patient Instructions (Addendum)
 Please go to the lab in the basement of our building to have lab work done as you leave today. Hit B for basement when you get on the elevator.  When the doors open the lab is on your left.  We will call you with the results. Thank you.  Continue Ozempic.   Increase your coffee intake  Follow up in 6 months.  Thank you for entrusting me with your care and for choosing [facility], Dr. Elspeth Naval    _______________________________________________________  If your blood pressure at your visit was 140/90 or greater, please contact your primary care physician to follow up on this.  _______________________________________________________  If you are age 73 or older, your body mass index should be between 23-30. Your Body mass index is 27.48 kg/m. If this is out of the aforementioned range listed, please consider follow up with your Primary Care Provider.  If you are age 40 or younger, your body mass index should be between 19-25. Your Body mass index is 27.48 kg/m. If this is out of the aformentioned range listed, please consider follow up with your Primary Care Provider.   ________________________________________________________  The [facility] providers would like to encourage you to use MYCHART to communicate with providers for non-urgent requests or questions.  Due to long hold times on the telephone, sending your provider a message by [facility] may be a faster and more efficient way to get a response.  Please allow 48 business hours for a response.  Please remember that this is for non-urgent requests.  _______________________________________________________  [facility] is using a team-based approach to care.  Your team is made up of your doctor and two to three APPS. Our APPS (Nurse Practitioners and Physician Assistants) work with your physician to ensure care continuity for you. They are fully qualified to address your health concerns and develop a  treatment plan. They communicate directly with your gastroenterologist to care for you. Seeing the Advanced Practice Practitioners on your physician's team can help you by facilitating care more promptly, often allowing for earlier appointments, access to diagnostic testing, procedures, and other specialty referrals.

## 2023-11-11 ENCOUNTER — Ambulatory Visit: Payer: Self-pay | Admitting: Gastroenterology

## 2023-11-11 LAB — HEPATITIS C ANTIBODY: Hepatitis C Ab: NONREACTIVE

## 2023-12-15 NOTE — Telephone Encounter (Addendum)
 Spoke to [facility] ON 11/26/2023. MYRINGOPLASTY [id number]) surgery scheduled on  01/08/2024 at [facility]. The post op appt is scheduled for  01/21/2024 at 10:00AM.  EMAIL & MYCHART the information for surgery.

## 2023-12-23 ENCOUNTER — Ambulatory Visit (HOSPITAL_BASED_OUTPATIENT_CLINIC_OR_DEPARTMENT_OTHER): Admission: RE | Admit: 2023-12-23 | Source: Home / Self Care | Admitting: Otolaryngology

## 2023-12-23 ENCOUNTER — Encounter (HOSPITAL_BASED_OUTPATIENT_CLINIC_OR_DEPARTMENT_OTHER): Admission: RE | Payer: Self-pay | Source: Home / Self Care

## 2023-12-23 SURGERY — MYRINGOPLASTY WITH FAT GRAFT
Anesthesia: General | Laterality: Right

## 2023-12-26 ENCOUNTER — Ambulatory Visit
Admission: RE | Admit: 2023-12-26 | Discharge: 2023-12-26 | Disposition: A | Discharge status: Home / Self Care | Source: Ambulatory Visit | Attending: Internal Medicine | Admitting: Internal Medicine

## 2023-12-26 DIAGNOSIS — Z87891 Personal history of nicotine dependence: Secondary | ICD-10-CM

## 2023-12-29 ENCOUNTER — Other Ambulatory Visit: Payer: Self-pay | Admitting: Otolaryngology

## 2024-01-06 ENCOUNTER — Encounter (HOSPITAL_COMMUNITY): Payer: Self-pay | Admitting: Otolaryngology

## 2024-01-06 ENCOUNTER — Other Ambulatory Visit: Payer: Self-pay

## 2024-01-06 NOTE — Progress Notes (Signed)
 Anesthesia Chart Review: Same day workup  73 year old male follows with cardiology for history of CAD s/p CABG x4 in 2018, CVA 2018, HTN, severe LE PAD with bilateral small vessel angioplasty in 2019 maintained on Plavix .  Echo 08/2021 showed EF 55 to 60%, mild mitral regurgitation, stable wall motion abnormalities.  Last seen by Dr. Ladona 08/21/2023 and noted to be doing well, asymptomatic from cardiovascular standpoint, remains active with activities including walking, climbing stairs, canoeing. 81 mg ASA was discontinued due to Plavix  use.  Current medications were otherwise continued.  Dr. Carlie advised patient to hold Plavix  5 days.   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  Patient will need day of surgery labs and evaluation.   EKG 08/21/2023: Sinus rhythm with first-degree AV block at the rate of 83 bpm, inferior infarct old. Poor R wave progression, cannot exclude anteroseptal infarct old. LVH with repolarization abnormality, cannot exclude inferior and lateral ischemia. Single PVC.  No significant change.   Echocardiogram 09/12/2021:  Normal LV systolic function with visual EF 55-60%. Left ventricle cavity  is normal in size. Mild left ventricular hypertrophy. Normal diastolic  filling pattern, normal LAP. Left ventricle regional wall motion findings:  Basal inferolateral, Basal inferior, Mid inferolateral, Mid inferior and  Apical septal hypokinesis.  Left atrial cavity is mildly dilated.  Mild (Grade I) mitral regurgitation.  Compared to 03/30/2016 LVEF improved from 50-55% to 55-60%, Grade 2  diastolic dysfunction is now normal, Mild/moderate MR is now mild,  otherwise no significant change.        Lynwood Geofm [facility]/Anesthesiology Phone [phone] 01/06/2024 3:41 PM

## 2024-01-06 NOTE — Progress Notes (Signed)
 SDW CALL  Patient was given pre-op instructions over the phone. The opportunity was given for the patient to ask questions. No further questions asked. Patient verbalized understanding of instructions given.  Patient states that the office instructed him to arrive to the hospital at 10 and that the earliest he could arrive is 0930.     PCP - Elouise Ferron Cardiologist - Dr. Ladona  - LOV - 08/21/23  PPM/ICD - denies   Chest x-ray - denies EKG - 08/21/23 Stress Test -  ECHO - 09/12/21 Cardiac Cath - 2018  Sleep Study - denies CPAP - n/a  Fasting Blood Sugar - 104-105 Pt wears a [employer]; Patient instructed to check blood sugar on the day of surgery and if blood sugar is less than 70 then patient would need to drink 1/2 cup of apple juice or cranberry juice and then recheck blood sugar.  Patient stated that he has never had a hypoglycemic event.   Last dose of GLP1 agonist-  last dose of Ozempic was 10/9 GLP1 instructions: patient aware not to take a dose prior to surgery  Blood Thinner Instructions:  per instructions given to patient, Plavix  to be held for 5 days prior to surgery.  Last dose was 10/10 Aspirin  Instructions: n/a  ERAS Protcol - clears until 0845 PRE-SURGERY Ensure or G2- n/a  COVID TEST- no   Anesthesia review: yes - cardiac history; plavix ;  Patient denies shortness of breath, fever, cough and chest pain over the phone call   All instructions explained to the patient, with a verbal understanding of the material. Patient agrees to go over the instructions while at home for a better understanding.

## 2024-01-06 NOTE — Anesthesia Preprocedure Evaluation (Signed)
 Anesthesia Evaluation  Patient identified by MRN, date of birth, ID band Patient awake    Reviewed: Allergy & Precautions, NPO status , Patient's Chart, lab work & pertinent test results, reviewed documented beta blocker date and time   History of Anesthesia Complications Negative for: history of anesthetic complications  Airway Mallampati: II  TM Distance: >3 FB     Dental  (+) Dental Advisory Given, Caps, Chipped, Missing   Pulmonary neg sleep apnea, COPD, Patient abstained from smoking.Not current smoker, former smoker   Pulmonary exam normal breath sounds clear to auscultation       Cardiovascular Exercise Tolerance: Good METShypertension, Pt. on medications + CAD and + CABG  (-) Past MI Normal cardiovascular exam(-) dysrhythmias  Rhythm:Regular Rate:Normal  CABG x 4 2018  Echo 06/17/16 Left ventricle: Normal cavity size and wall thickness. LV systolic  function is low normal with an EF of 50-55%. Wall motion is abnormal.  Inferior wall motion is hypokinetic. Inferolateral wall motion is  hypokinetic. No thrombus present. No mass present.   Aortic valve: No AV vegetation.   Mitral valve: No leaflet thickening and calcification present. Mild  regurgitation. There is mild prolapse of the posterior mitral leaflet.  Holosystolic mitral valve prolapse.   Right ventricle: Normal cavity size, wall thickness and ejection  fraction.   Left ventricle: Normal cavity size.   EKG 08/20/22 NSR, inferior infarct, poor R wave progression, possible anteroseptal MI, LVH    Neuro/Psych  Headaches Mixed conductive and sensorineural hearing loss of right ear Otorrhea, right ear Tympanic membrane Left facial droop CVA, Residual Symptoms  negative psych ROS   GI/Hepatic Neg liver ROS,GERD  Medicated and Controlled,,  Endo/Other  diabetes, Well Controlled, Type 2, Oral Hypoglycemic Agents, Insulin  Dependent  GLP-1 RA therapy- last  dose 7 days ago Denies GI symptoms today  Renal/GU negative Renal ROS  negative genitourinary   Musculoskeletal  (+) Arthritis , Osteoarthritis,    Abdominal   Peds  Hematology Plavix  therapy- last dose 2/14    Anesthesia Other Findings Past Medical History: No date: Abnormal nuclear stress test No date: Arthritis No date: BCC (basal cell carcinoma of skin) 05/2017: Cellulitis and abscess of hand No date: Coronary artery disease     Comment:  3-vessel No date: Diabetes mellitus without complication (HCC) No date: Emphysema lung (HCC) No date: GERD (gastroesophageal reflux disease) No date: Gout No date: Headache No date: History of tobacco abuse No date: Hyperlipidemia No date: Hypertension No date: Inguinal hernia     Comment:  right No date: Ischemic stroke (HCC) No date: 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: Stroke (HCC)     Comment:  left facial droop No date: 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) No date: Visual disturbance  Reproductive/Obstetrics ED                              Anesthesia Physical Anesthesia Plan  ASA: 3  Anesthesia Plan: General   Post-op Pain Management: Ofirmev  IV (intra-op)*   Induction: Intravenous  PONV Risk Score and Plan: 3 and Treatment may vary due to age or medical condition, Ondansetron  and Dexamethasone   Airway Management Planned: LMA  Additional Equipment: None  Intra-op Plan:   Post-operative Plan: Extubation in OR  Informed Consent: I have reviewed the patients History and Physical, chart, labs and discussed the procedure including the risks, benefits and alternatives for the proposed anesthesia with the patient or authorized representative who has indicated  his/her understanding and acceptance.     Dental advisory given  Plan Discussed with: Anesthesiologist and CRNA  Anesthesia Plan Comments: (Discussed risks of anesthesia with patient, including PONV, sore throat, lip/dental/eye  damage. Rare risks discussed as well, such as cardiorespiratory and neurological sequelae, and allergic reactions. Discussed the role of CRNA in patient's perioperative care. Patient understands.)         Anesthesia Quick Evaluation

## 2024-01-08 ENCOUNTER — Encounter (HOSPITAL_COMMUNITY): Payer: Self-pay | Admitting: Otolaryngology

## 2024-01-08 ENCOUNTER — Ambulatory Visit (HOSPITAL_COMMUNITY): Payer: Self-pay | Admitting: Physician Assistant

## 2024-01-08 ENCOUNTER — Ambulatory Visit (HOSPITAL_COMMUNITY)
Admission: RE | Admit: 2024-01-08 | Discharge: 2024-01-08 | Disposition: A | Discharge status: Home / Self Care | Attending: Otolaryngology | Admitting: Otolaryngology

## 2024-01-08 ENCOUNTER — Other Ambulatory Visit: Payer: Self-pay

## 2024-01-08 ENCOUNTER — Encounter (HOSPITAL_COMMUNITY)
Admission: RE | Disposition: A | Discharge status: Home / Self Care | Payer: Self-pay | Source: Home / Self Care | Attending: Otolaryngology

## 2024-01-08 DIAGNOSIS — Z87891 Personal history of nicotine dependence: Secondary | ICD-10-CM

## 2024-01-08 DIAGNOSIS — I10 Essential (primary) hypertension: Secondary | ICD-10-CM | POA: Insufficient documentation

## 2024-01-08 DIAGNOSIS — H7291 Unspecified perforation of tympanic membrane, right ear: Secondary | ICD-10-CM

## 2024-01-08 DIAGNOSIS — Z794 Long term (current) use of insulin: Secondary | ICD-10-CM | POA: Insufficient documentation

## 2024-01-08 DIAGNOSIS — J439 Emphysema, unspecified: Secondary | ICD-10-CM | POA: Insufficient documentation

## 2024-01-08 DIAGNOSIS — I2510 Atherosclerotic heart disease of native coronary artery without angina pectoris: Secondary | ICD-10-CM

## 2024-01-08 DIAGNOSIS — Z951 Presence of aortocoronary bypass graft: Secondary | ICD-10-CM | POA: Diagnosis not present

## 2024-01-08 DIAGNOSIS — Z7985 Long-term (current) use of injectable non-insulin antidiabetic drugs: Secondary | ICD-10-CM | POA: Diagnosis not present

## 2024-01-08 DIAGNOSIS — Z8673 Personal history of transient ischemic attack (TIA), and cerebral infarction without residual deficits: Secondary | ICD-10-CM | POA: Diagnosis not present

## 2024-01-08 DIAGNOSIS — Z9862 Peripheral vascular angioplasty status: Secondary | ICD-10-CM | POA: Insufficient documentation

## 2024-01-08 DIAGNOSIS — E119 Type 2 diabetes mellitus without complications: Secondary | ICD-10-CM | POA: Insufficient documentation

## 2024-01-08 DIAGNOSIS — Z7902 Long term (current) use of antithrombotics/antiplatelets: Secondary | ICD-10-CM | POA: Diagnosis not present

## 2024-01-08 DIAGNOSIS — Z79899 Other long term (current) drug therapy: Secondary | ICD-10-CM | POA: Insufficient documentation

## 2024-01-08 LAB — CBC
HCT: 48.8 % (ref 39.0–52.0)
Hemoglobin: 16.9 g/dL (ref 13.0–17.0)
MCH: 29.9 pg (ref 26.0–34.0)
MCHC: 34.6 g/dL (ref 30.0–36.0)
MCV: 86.4 fL (ref 80.0–100.0)
Platelets: 158 K/uL (ref 150–400)
RBC: 5.65 MIL/uL (ref 4.22–5.81)
RDW: 13.3 % (ref 11.5–15.5)
WBC: 8.7 K/uL (ref 4.0–10.5)
nRBC: 0 % (ref 0.0–0.2)

## 2024-01-08 LAB — GLUCOSE, CAPILLARY
Glucose-Capillary: 126 mg/dL — ABNORMAL HIGH (ref 70–99)
Glucose-Capillary: 112 mg/dL — ABNORMAL HIGH (ref 70–99)
Glucose-Capillary: 114 mg/dL — ABNORMAL HIGH (ref 70–99)

## 2024-01-08 SURGERY — MYRINGOPLASTY WITH FAT GRAFT
Anesthesia: General | Site: Ear | Laterality: Right

## 2024-01-08 MED ORDER — LIDOCAINE-EPINEPHRINE 1 %-1:100000 IJ SOLN
INTRAMUSCULAR | Status: DC | PRN
  Administered 2024-01-08: 2 mL

## 2024-01-08 MED ORDER — INSULIN ASPART 100 UNIT/ML IJ SOLN: 0.0000 [IU] | INTRAMUSCULAR | Status: DC | PRN

## 2024-01-08 MED ORDER — LIDOCAINE 2% (20 MG/ML) 5 ML SYRINGE
INTRAMUSCULAR | Status: DC | PRN
  Administered 2024-01-08: 100 mg via INTRAVENOUS

## 2024-01-08 MED ORDER — CIPROFLOXACIN-DEXAMETHASONE 0.3-0.1 % OT SUSP
OTIC | Status: AC
  Filled 2024-01-08: qty 7.5

## 2024-01-08 MED ORDER — FENTANYL CITRATE (PF) 100 MCG/2ML IJ SOLN: 25.0000 ug | INTRAMUSCULAR | Status: DC | PRN

## 2024-01-08 MED ORDER — BACITRACIN ZINC 500 UNIT/GM EX OINT
TOPICAL_OINTMENT | CUTANEOUS | Status: DC | PRN
  Administered 2024-01-08: 1 via TOPICAL

## 2024-01-08 MED ORDER — DEXAMETHASONE SOD PHOSPHATE PF 10 MG/ML IJ SOLN
INTRAMUSCULAR | Status: DC | PRN
  Administered 2024-01-08: 10 mg via INTRAVENOUS

## 2024-01-08 MED ORDER — FENTANYL CITRATE (PF) 100 MCG/2ML IJ SOLN
INTRAMUSCULAR | Status: AC
  Filled 2024-01-08: qty 2

## 2024-01-08 MED ORDER — CEFAZOLIN SODIUM-DEXTROSE 2-4 GM/100ML-% IV SOLN
2.0000 g | INTRAVENOUS | Status: AC
  Administered 2024-01-08: 2 g via INTRAVENOUS
  Filled 2024-01-08: qty 100

## 2024-01-08 MED ORDER — METHYLENE BLUE 20 MG/2ML IV SOSY
PREFILLED_SYRINGE | INTRAVENOUS | Status: AC
  Filled 2024-01-08: qty 2

## 2024-01-08 MED ORDER — LIDOCAINE 2% (20 MG/ML) 5 ML SYRINGE
INTRAMUSCULAR | Status: AC
  Filled 2024-01-08: qty 5

## 2024-01-08 MED ORDER — ORAL CARE MOUTH RINSE: 15.0000 mL | Freq: Once | OROMUCOSAL | Status: AC

## 2024-01-08 MED ORDER — DROPERIDOL 2.5 MG/ML IJ SOLN: 0.6250 mg | Freq: Once | INTRAMUSCULAR | Status: DC | PRN

## 2024-01-08 MED ORDER — 0.9 % SODIUM CHLORIDE (POUR BTL) OPTIME
TOPICAL | Status: DC | PRN
  Administered 2024-01-08: 1000 mL

## 2024-01-08 MED ORDER — OXYCODONE HCL 5 MG PO TABS: 5.0000 mg | ORAL_TABLET | Freq: Once | ORAL | Status: DC | PRN

## 2024-01-08 MED ORDER — FENTANYL CITRATE (PF) 250 MCG/5ML IJ SOLN
INTRAMUSCULAR | Status: DC | PRN
  Administered 2024-01-08 (×2): 50 ug via INTRAVENOUS

## 2024-01-08 MED ORDER — PROPOFOL 10 MG/ML IV BOLUS
INTRAVENOUS | Status: AC
Start: 2024-01-08 — End: 2024-01-08
  Filled 2024-01-08: qty 20

## 2024-01-08 MED ORDER — ONDANSETRON HCL 4 MG/2ML IJ SOLN
INTRAMUSCULAR | Status: DC | PRN
  Administered 2024-01-08: 4 mg via INTRAVENOUS

## 2024-01-08 MED ORDER — LIDOCAINE-EPINEPHRINE 1 %-1:100000 IJ SOLN
INTRAMUSCULAR | Status: AC
  Filled 2024-01-08: qty 1

## 2024-01-08 MED ORDER — EPINEPHRINE HCL (NASAL) 0.1 % NA SOLN
NASAL | Status: AC
  Filled 2024-01-08: qty 30

## 2024-01-08 MED ORDER — ONDANSETRON HCL 4 MG/2ML IJ SOLN
INTRAMUSCULAR | Status: AC
  Filled 2024-01-08: qty 2

## 2024-01-08 MED ORDER — BACITRACIN ZINC 500 UNIT/GM EX OINT
TOPICAL_OINTMENT | CUTANEOUS | Status: AC
  Filled 2024-01-08: qty 28.35

## 2024-01-08 MED ORDER — CHLORHEXIDINE GLUCONATE 0.12 % MT SOLN
15.0000 mL | Freq: Once | OROMUCOSAL | Status: AC
  Administered 2024-01-08: 15 mL via OROMUCOSAL
  Filled 2024-01-08: qty 15

## 2024-01-08 MED ORDER — HEMOSTATIC AGENTS (NO CHARGE) OPTIME
TOPICAL | Status: DC | PRN
  Administered 2024-01-08: 1 via TOPICAL

## 2024-01-08 MED ORDER — OXYCODONE HCL 5 MG/5ML PO SOLN: 5.0000 mg | Freq: Once | ORAL | Status: DC | PRN

## 2024-01-08 MED ORDER — PHENYLEPHRINE HCL-NACL 20-0.9 MG/250ML-% IV SOLN
INTRAVENOUS | Status: DC | PRN
  Administered 2024-01-08: 45 ug/min via INTRAVENOUS

## 2024-01-08 MED ORDER — LACTATED RINGERS IV SOLN: INTRAVENOUS | Status: DC

## 2024-01-08 MED ORDER — ACETAMINOPHEN 10 MG/ML IV SOLN: 1000.0000 mg | Freq: Once | INTRAVENOUS | Status: DC | PRN

## 2024-01-08 MED ORDER — PROPOFOL 10 MG/ML IV BOLUS
INTRAVENOUS | Status: DC | PRN
  Administered 2024-01-08: 40 mg via INTRAVENOUS
  Administered 2024-01-08: 120 mg via INTRAVENOUS

## 2024-01-08 SURGICAL SUPPLY — 32 items
BAG COUNTER SPONGE SURGICOUNT (BAG) ×1 IMPLANT
BLADE MYRINGOTOMY 6 SPEAR HDL (BLADE) IMPLANT
BLADE SURG 15 STRL LF DISP TIS (BLADE) IMPLANT
CANISTER SUCTION 3000ML PPV (SUCTIONS) ×1 IMPLANT
CLEANER TIP ELECTROSURG 2X2 (MISCELLANEOUS) IMPLANT
CORD BIPOLAR FORCEPS 12FT (ELECTRODE) IMPLANT
COTTONBALL LRG STERILE PKG (GAUZE/BANDAGES/DRESSINGS) ×1 IMPLANT
COVER MAYO STAND STRL (DRAPES) ×1 IMPLANT
COVER SURGICAL LIGHT HANDLE (MISCELLANEOUS) IMPLANT
DRAPE EENT ADH APERT 15X15 STR (DRAPES) IMPLANT
DRAPE HALF SHEET 40X57 (DRAPES) ×1 IMPLANT
DRAPE MICROSCOPE LEICA 54X105 (DRAPES) IMPLANT
DRAPE SURG 17X23 STRL (DRAPES) IMPLANT
ELECT COATED BLADE 2.86 ST (ELECTRODE) IMPLANT
GLOVE BIO SURGEON STRL SZ7.5 (GLOVE) ×1 IMPLANT
KIT TURNOVER KIT B (KITS) ×1 IMPLANT
NDL HYPO 25GX1X1/2 BEV (NEEDLE) IMPLANT
NDL PRECISIONGLIDE 27X1.5 (NEEDLE) IMPLANT
NEEDLE HYPO 25GX1X1/2 BEV (NEEDLE) IMPLANT
NEEDLE PRECISIONGLIDE 27X1.5 (NEEDLE) ×1 IMPLANT
PAD ARMBOARD POSITIONER FOAM (MISCELLANEOUS) ×2 IMPLANT
PENCIL SMOKE EVACUATOR (MISCELLANEOUS) IMPLANT
POSITIONER HEAD DONUT 9IN (MISCELLANEOUS) IMPLANT
SOLN STERILE WATER 1000 ML (IV SOLUTION) ×1 IMPLANT
SOLN STERILE WATER BTL 1000 ML (IV SOLUTION) ×1 IMPLANT
SPONGE SURGIFOAM ABS GEL 12-7 (HEMOSTASIS) IMPLANT
SUT PLAIN GUT FAST 5-0 (SUTURE) IMPLANT
SYR 3ML LL SCALE MARK (SYRINGE) IMPLANT
SYR BULB EAR ULCER 3OZ GRN STR (SYRINGE) IMPLANT
TOWEL GREEN STERILE FF (TOWEL DISPOSABLE) ×1 IMPLANT
TUBE EAR VENT ACTIVENT 1.14 (OTOLOGIC RELATED) IMPLANT
WIPE INSTRUMENT VISIWIPE 73X73 (MISCELLANEOUS) IMPLANT

## 2024-01-08 NOTE — Anesthesia Procedure Notes (Signed)
 Procedure Name: LMA Insertion Date/Time: 01/08/2024 12:08 PM  Performed by: Mannie Krystal LABOR, CRNAPre-anesthesia Checklist: Patient identified, Emergency Drugs available, Suction available and Patient being monitored Patient Re-evaluated:Patient Re-evaluated prior to induction Oxygen Delivery Method: Circle system utilized Preoxygenation: Pre-oxygenation with 100% oxygen Induction Type: IV induction LMA: LMA inserted LMA Size: 4.0 Tube type: Oral Number of attempts: 1 Placement Confirmation: positive ETCO2 and breath sounds checked- equal and bilateral Tube secured with: Tape Dental Injury: Teeth and Oropharynx as per pre-operative assessment

## 2024-01-08 NOTE — Transfer of Care (Signed)
 Immediate Anesthesia Transfer of Care Note  Patient: Drew Harrison  Procedure(s) Performed: MYRINGOPLASTY WITH FAT GRAFT (Right: Ear)  Patient Location: PACU  Anesthesia Type:General  Level of Consciousness: awake, alert , and oriented  Airway & Oxygen Therapy: Patient Spontanous Breathing  Post-op Assessment: Report given to RN and Post -op Vital signs reviewed and stable  Post vital signs: Reviewed and stable  Last Vitals:  Vitals Value Taken Time  BP 139/74 01/08/24 13:00  Temp    Pulse 79 01/08/24 13:01  Resp 14 01/08/24 13:01  SpO2 96 % 01/08/24 13:01  Vitals shown include unfiled device data.  Last Pain:  Vitals:   01/08/24 1001  TempSrc:   PainSc: 0-No pain      Patients Stated Pain Goal: 0 (01/08/24 1001)  Complications: No notable events documented.

## 2024-01-08 NOTE — Progress Notes (Signed)
 Received a call from the lab that the BMP hemolyzed. Dr. Boone made aware. No need to recollect per Dr. RONAL Boone.

## 2024-01-08 NOTE — H&P (Signed)
 Drew Harrison is an 73 y.o. male.   Chief Complaint: Right TM perforation HPI: 73 year old male who previously underwent right tympanomastoidectomy but had persistence of a small perforation associated with intermittent otorrhea.  He has treated the ear with Ciprodex  drops with improvement and presents for surgical management.  Past Medical History:  Diagnosis Date   Abnormal nuclear stress test    Arthritis    BCC (basal cell carcinoma of skin)    Cellulitis and abscess of hand 05/2017   Coronary artery disease    3-vessel   Diabetes mellitus without complication (HCC)    Emphysema lung (HCC)    GERD (gastroesophageal reflux disease)    Gout    Headache    History of tobacco abuse    Hyperlipidemia    Hypertension    Inguinal hernia    right   Ischemic stroke (HCC)    Squamous cell carcinoma of scalp    Stroke (HCC) 03/29/2016   left facial droop   Stroke due to embolism [facility])    Visual disturbance     Past Surgical History:  Procedure Laterality Date   BASAL CELL CARCINOMA EXCISION  2024   right calf   CATARACT EXTRACTION     CORONARY ARTERY BYPASS GRAFT N/A 06/17/2016   Procedure: CORONARY ARTERY BYPASS GRAFTING (CABG) x four using left internal mammary artery and right greater saphenous leg vein using endoscope.;  Surgeon: Maude Fleeta Ochoa, MD;  Location: [facility] OR;  Service: Open Heart Surgery;  Laterality: N/A;   LEFT HEART CATH AND CORONARY ANGIOGRAPHY N/A 05/14/2016   Procedure: Left Heart Cath and Coronary Angiography;  Surgeon: Gordy Bergamo, MD;  Location: [facility] INVASIVE CV LAB;  Service: Cardiovascular;  Laterality: N/A;   LOWER EXTREMITY ANGIOGRAPHY N/A 01/21/2017   Procedure: Lower Extremity Angiography;  Surgeon: Bergamo Gordy, MD;  Location: [facility] INVASIVE CV LAB;  Service: Cardiovascular;  Laterality: N/A;   LOWER EXTREMITY ANGIOGRAPHY Left 08/26/2017   Procedure: LOWER EXTREMITY ANGIOGRAPHY;  Surgeon: Bergamo Gordy, MD;  Location: [facility] INVASIVE CV LAB;  Service:  Cardiovascular;  Laterality: Left;   LOWER EXTREMITY ANGIOGRAPHY Right 09/23/2017   Procedure: LOWER EXTREMITY ANGIOGRAPHY;  Surgeon: Elmira Newman PARAS, MD;  Location: [facility] INVASIVE CV LAB;  Service: Cardiovascular;  Laterality: Right;   MOHS SURGERY     Dec 2023   MOHS SURGERY  03/2023   scalp   PERIPHERAL VASCULAR ATHERECTOMY  08/26/2017   Procedure: PERIPHERAL VASCULAR ATHERECTOMY;  Surgeon: Bergamo Gordy, MD;  Location: [facility] INVASIVE CV LAB;  Service: Cardiovascular;;   PERIPHERAL VASCULAR ATHERECTOMY  09/23/2017   Procedure: PERIPHERAL VASCULAR ATHERECTOMY;  Surgeon: Elmira Newman PARAS, MD;  Location: [facility] INVASIVE CV LAB;  Service: Cardiovascular;;   PERIPHERAL VASCULAR BALLOON ANGIOPLASTY  08/26/2017   Procedure: PERIPHERAL VASCULAR BALLOON ANGIOPLASTY;  Surgeon: Bergamo Gordy, MD;  Location: [facility] INVASIVE CV LAB;  Service: Cardiovascular;;   PERIPHERAL VASCULAR BALLOON ANGIOPLASTY  09/23/2017   Procedure: PERIPHERAL VASCULAR BALLOON ANGIOPLASTY;  Surgeon: Elmira Newman PARAS, MD;  Location: [facility] INVASIVE CV LAB;  Service: Cardiovascular;;   SKIN CANCER EXCISION     nose- basal cell   TEE WITHOUT CARDIOVERSION N/A 06/17/2016   Procedure: TRANSESOPHAGEAL ECHOCARDIOGRAM (TEE);  Surgeon: Maude Fleeta Ochoa, MD;  Location: [facility] OR;  Service: Open Heart Surgery;  Laterality: N/A;   TONSILLECTOMY     TYMPANOMASTOIDECTOMY Right 05/15/2023   Procedure: RIGHT TYMPANOMASTOIDECTOMY WITH OSSICULAR CHAIN RECONSTRUCTION;  Surgeon: Carlie Clark, MD;  Location: [facility] OR;  Service: ENT;  Laterality: Right;  Family History  Problem Relation Age of Onset   Heart disease Father    Pancreatic cancer Brother    Colon cancer Neg Hx    Esophageal cancer Neg Hx    Rectal cancer Neg Hx    Stomach cancer Neg Hx    Social History:  reports that he quit smoking about 7 years ago. His smoking use included cigarettes. He started smoking about 55 years ago. He has a 72 pack-year smoking history. He has never used smokeless  tobacco. He reports that he does not currently use alcohol after a past usage of about 14.0 standard drinks of alcohol per week. He reports that he does not currently use drugs after having used the following drugs: Cocaine.  Allergies: No Known Allergies  Medications Prior to Admission  Medication Sig Dispense Refill   allopurinol  (ZYLOPRIM ) 100 MG tablet Take 200 mg by mouth in the morning.  0   amLODipine  (NORVASC ) 10 MG tablet Take 10 mg by mouth daily.     atorvastatin  (LIPITOR) 80 MG tablet TAKE 1 TABLET(80 MG) BY MOUTH DAILY 90 tablet 3   chlorthalidone  (HYGROTON ) 25 MG tablet TAKE 1 TABLET(25 MG) BY MOUTH DAILY 90 tablet 3   clopidogrel  (PLAVIX ) 75 MG tablet Take 75 mg by mouth in the morning.     cyanocobalamin (VITAMIN B12) 1000 MCG tablet Take 1,000 mcg by mouth in the morning.     docusate sodium  (COLACE) 100 MG capsule Take 100 mg by mouth in the morning.     ezetimibe  (ZETIA ) 10 MG tablet TAKE 1 TABLET(10 MG) BY MOUTH DAILY 30 tablet 6   LANTUS  SOLOSTAR 100 UNIT/ML Solostar Pen Inject 34 Units into the skin at bedtime.     Magnesium  Oxide -Mg Supplement 500 MG TABS Take 500 mg by mouth daily.     metoprolol  tartrate (LOPRESSOR ) 50 MG tablet Take 1 tablet (50 mg total) by mouth 2 (two) times daily. 180 tablet 3   Semaglutide, 1 MG/DOSE, (OZEMPIC, 1 MG/DOSE,) 4 MG/3ML SOPN Inject 1 mg into the skin every Friday.     tadalafil (CIALIS) 5 MG tablet Take 5 mg by mouth every evening.     valsartan  (DIOVAN ) 160 MG tablet Take 1 tablet (160 mg total) by mouth daily. 90 tablet 3   BD PEN NEEDLE NANO 2ND GEN 32G X 4 MM MISC USE TWICE DAILY WITH LEVEMIR       Results for orders placed or performed during the hospital encounter of 01/08/24 (from the past 48 hours)  CBC per protocol     Status: None   Collection Time: 01/08/24  9:47 AM  Result Value Ref Range   WBC 8.7 4.0 - 10.5 K/uL   RBC 5.65 4.22 - 5.81 MIL/uL   Hemoglobin 16.9 13.0 - 17.0 g/dL   HCT 51.1 60.9 - 47.9 %   MCV 86.4  80.0 - 100.0 fL   MCH 29.9 26.0 - 34.0 pg   MCHC 34.6 30.0 - 36.0 g/dL   RDW 86.6 88.4 - 84.4 %   Platelets 158 150 - 400 K/uL   nRBC 0.0 0.0 - 0.2 %    Comment: Performed at [facility], [address], [place], [place] [address]  Glucose, capillary     Status: Abnormal   Collection Time: 01/08/24  9:50 AM  Result Value Ref Range   Glucose-Capillary 126 (H) 70 - 99 mg/dL    Comment: Glucose reference range applies only to samples taken after fasting for at least  8 hours.   No results found.  Review of Systems  All other systems reviewed and are negative.   Blood pressure (!) 161/85, pulse 86, temperature 97.7 F (36.5 C), temperature source Oral, resp. rate 18, height 5' 11 (1.803 m), weight 88.5 kg, SpO2 97%. Physical Exam Constitutional:      Appearance: Normal appearance. He is normal weight.  HENT:     Head: Normocephalic and atraumatic.     Right Ear: External ear normal.     Left Ear: External ear normal.     Nose: Nose normal.     Mouth/Throat:     Mouth: Mucous membranes are moist.     Pharynx: Oropharynx is clear.  Eyes:     Extraocular Movements: Extraocular movements intact.     Pupils: Pupils are equal, round, and reactive to light.  Cardiovascular:     Rate and Rhythm: Normal rate.  Pulmonary:     Effort: Pulmonary effort is normal.  Skin:    General: Skin is warm and dry.  Neurological:     General: No focal deficit present.     Mental Status: He is alert and oriented to person, place, and time.  Psychiatric:        Mood and Affect: Mood normal.        Behavior: Behavior normal.        Thought Content: Thought content normal.        Judgment: Judgment normal.      Assessment/Plan Right TM perforation  To OR for right fat graft myringoplasty.  Vaughan Ricker, MD 01/08/2024, 11:27 AM

## 2024-01-08 NOTE — Anesthesia Postprocedure Evaluation (Signed)
 Anesthesia Post Note  Patient: Drew Harrison  Procedure(s) Performed: MYRINGOPLASTY WITH FAT GRAFT (Right: Ear)     Patient location during evaluation: PACU Anesthesia Type: General Level of consciousness: awake and alert Pain management: pain level controlled Vital Signs Assessment: post-procedure vital signs reviewed and stable Respiratory status: spontaneous breathing, nonlabored ventilation, respiratory function stable and patient connected to nasal cannula oxygen Cardiovascular status: blood pressure returned to baseline and stable Postop Assessment: no apparent nausea or vomiting Anesthetic complications: no   No notable events documented.  Last Vitals:  Vitals:   01/08/24 0947 01/08/24 1256  BP: (!) 161/85 (!) 143/74  Pulse: 86 81  Resp: 18 12  Temp: 36.5 C 36.6 C  SpO2: 97% 95%    Last Pain:  Vitals:   01/08/24 1001  TempSrc:   PainSc: 0-No pain                 Rome Ade

## 2024-01-08 NOTE — Op Note (Signed)
 Preop diagnosis: Right tympanic membrane perforation Postop diagnosis: same Procedure: Right fat graft myringoplasty Surgeon: Carlie Anesth: General LMA with local 1% lidocaine  with 1:100,000 epinephrine  Compl: None Findings: Right ear canal with wet cerumen and mild mucoid otorrhea.  Right TM with area of surface granulation inferiorly with very small perforation within. Description:  After discussing risks, benefits, and alternatives, the patient was brought to the operative suite and placed on the operative table in the supine position.  Anesthesia was induced and the patient was intubated by the Anesthesia team with an LMA without difficulty.  The eyes were taped closed and the bed was turned 90 degrees from Anesthesia.  The patient was given IV antibiotics during the case. The right ear was then prepped and draped in sterile fashion.  Under the operating microscope, the ear was inspected with an ear speculum.  The ear canal was cleared of wet debris and otorrhea using suction.  The tympanic membrane was carefully examined.  The small perforation was visualized with some surrounding granulation.  The perforation was freshened with a pick.  The posterior earlobe was injected with local anesthetic.  A small vertical incision was made with a 15 blade.  A lobule of fat was removed with scissors.  The incision was closed with 5-0 plain gut in simple, interrupted fashion.  Under the operating microscope, the fat graft was pressed into the perforation with about half still visible.  A piece of Gelfoam dipped in saline was placed over the graft.    Drapes were removed and the patient was cleaned off.  Cotton coated in Bacitracin  was placed in the conchal bowl.  The patient was then returned to Anesthesia for wake-up, was extubated, and moved to the recovery room in stable condition.

## 2024-01-09 ENCOUNTER — Encounter (HOSPITAL_COMMUNITY): Payer: Self-pay | Admitting: Otolaryngology

## 2024-01-23 ENCOUNTER — Encounter: Payer: Self-pay | Admitting: Gastroenterology

## 2024-01-27 ENCOUNTER — Ambulatory Visit: Admitting: Podiatry

## 2024-01-27 DIAGNOSIS — E1151 Type 2 diabetes mellitus with diabetic peripheral angiopathy without gangrene: Secondary | ICD-10-CM | POA: Diagnosis not present

## 2024-01-27 DIAGNOSIS — L84 Corns and callosities: Secondary | ICD-10-CM | POA: Diagnosis not present

## 2024-01-27 NOTE — Progress Notes (Signed)
 Subjective:  Patient ID: Drew Harrison, male    DOB: 08-07-1950,  MRN: [id number]  Drew Harrison presents to clinic today for:  Chief Complaint  Patient presents with   Callouses    Bilateral calluses plantar . 2 pain. Pt. Wants debridement. Not to close. IDDM A1C 5.7.   Patient presents for painful calluses bilateral submet 1 and bilateral submet 5.  He states that, since his last appointment, he did change his mind and he would like to proceed with the diabetic shoe program.  I did inform the patient that we are no longer completing the program at our facility, but can start the paperwork process to refer him to [employer] for the diabetic shoes.  I feel he would benefit from the custom diabetic insoles to offload the 1st and 5th metatarsals.  These are preulcerative calluses that Drew Harrison have a potential to ulcerate.  PCP is Drew Harrison, Drew Harrison, Drew Harrison.  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 male in NAD. AAO x 3.  Vascular Examination: Patient has palpable DP pulse, absent PT pulse bilateral.  Delayed capillary refill bilateral toes.  Sparse digital hair bilateral.  Proximal to distal cooling WNL bilateral.    Dermatological Examination: Interspaces are clear with no open lesions noted bilateral.  Skin is shiny and atrophic bilateral .  There  are hyperkeratotic lesions noted bilateral submet 1 and bilateral submet 5.     Latest Ref Rng & Units 05/09/2023   11:18 AM  Hemoglobin A1C  Hemoglobin-A1c 4.8 - 5.6 % 5.8    Patient qualifies for at-risk foot care because of diabetes with PVD.  Assessment/Plan: 1. Callus of foot   2. Type II diabetes mellitus with peripheral circulatory disorder (HCC)     Hyperkeratotic lesions bilateral submet 1 and bilateral submet 5 were shaved with #312 blade.  These were not shaved significantly as per his request.  They were also sanded with an umbrella bur.  The paperwork was completed for [employer] shoes and prosthetics company.  He will take the paperwork to his primary care provider's office to be completed.  They can either fax all of the forms together from their office or they can give them back to the patient and he can hand-deliver them to [employer] and to schedule an appointment.  Return for 4 months or as needed for calluses.   Awanda CHARM Imperial, DPM, FACFAS [facility]     [address]  [place], [place] [address]                Office [phone]  Fax [phone]

## 2024-02-04 ENCOUNTER — Other Ambulatory Visit: Payer: Self-pay | Admitting: Family Medicine

## 2024-02-04 DIAGNOSIS — Z136 Encounter for screening for cardiovascular disorders: Secondary | ICD-10-CM

## 2024-02-04 DIAGNOSIS — Z87891 Personal history of nicotine dependence: Secondary | ICD-10-CM

## 2024-02-09 ENCOUNTER — Ambulatory Visit
Admission: RE | Admit: 2024-02-09 | Discharge: 2024-02-09 | Disposition: A | Discharge status: Home / Self Care | Source: Ambulatory Visit | Attending: Family Medicine | Admitting: Family Medicine

## 2024-02-09 DIAGNOSIS — Z136 Encounter for screening for cardiovascular disorders: Secondary | ICD-10-CM

## 2024-02-09 DIAGNOSIS — Z87891 Personal history of nicotine dependence: Secondary | ICD-10-CM

## 2024-03-01 ENCOUNTER — Ambulatory Visit: Admitting: Podiatry

## 2024-03-30 ENCOUNTER — Encounter: Payer: Self-pay | Admitting: Cardiology

## 2024-04-22 ENCOUNTER — Ambulatory Visit: Admitting: General Surgery

## 2024-04-22 ENCOUNTER — Encounter: Payer: Self-pay | Admitting: General Surgery

## 2024-04-22 VITALS — BP 138/69 | HR 77 | Temp 97.7°F | Resp 12 | Ht 71.0 in | Wt 200.0 lb

## 2024-04-22 DIAGNOSIS — K4090 Unilateral inguinal hernia, without obstruction or gangrene, not specified as recurrent: Secondary | ICD-10-CM | POA: Diagnosis not present

## 2024-04-23 NOTE — Progress Notes (Signed)
 Drew Harrison; [id number]; 1950/12/31   HPI Patient is a 74 year old white male who referred himself back to my care for evaluation and treatment of his right inguinal hernia.  He has had a known right inguinal hernia for many years.  He states recently it has increased in size and causes him minimal discomfort.  He wants to remain very active as he likes to kayak.  The hernia is reducible, but primarily stays out.  He denies any nausea or vomiting. Past Medical History:  Diagnosis Date   Abnormal nuclear stress test    Arthritis    BCC (basal cell carcinoma of skin)    Cellulitis and abscess of hand 05/2017   Coronary artery disease    3-vessel   Diabetes mellitus without complication (HCC)    Emphysema lung (HCC)    GERD (gastroesophageal reflux disease)    Gout    Headache    History of tobacco abuse    Hyperlipidemia    Hypertension    Inguinal hernia    right   Ischemic stroke (HCC)    Squamous cell carcinoma of scalp    Stroke (HCC) 03/29/2016   left facial droop   Stroke due to embolism [facility])    Visual disturbance     Past Surgical History:  Procedure Laterality Date   BASAL CELL CARCINOMA EXCISION  2024   right calf   CATARACT EXTRACTION     CORONARY ARTERY BYPASS GRAFT N/A 06/17/2016   Procedure: CORONARY ARTERY BYPASS GRAFTING (CABG) x four using left internal mammary artery and right greater saphenous leg vein using endoscope.;  Surgeon: Maude Fleeta Ochoa, MD;  Location: [facility] OR;  Service: Open Heart Surgery;  Laterality: N/A;   LEFT HEART CATH AND CORONARY ANGIOGRAPHY N/A 05/14/2016   Procedure: Left Heart Cath and Coronary Angiography;  Surgeon: Gordy Bergamo, MD;  Location: [facility] INVASIVE CV LAB;  Service: Cardiovascular;  Laterality: N/A;   LOWER EXTREMITY ANGIOGRAPHY N/A 01/21/2017   Procedure: Lower Extremity Angiography;  Surgeon: Bergamo Gordy, MD;  Location: [facility] INVASIVE CV LAB;  Service: Cardiovascular;  Laterality: N/A;   LOWER EXTREMITY ANGIOGRAPHY Left 08/26/2017    Procedure: LOWER EXTREMITY ANGIOGRAPHY;  Surgeon: Bergamo Gordy, MD;  Location: [facility] INVASIVE CV LAB;  Service: Cardiovascular;  Laterality: Left;   LOWER EXTREMITY ANGIOGRAPHY Right 09/23/2017   Procedure: LOWER EXTREMITY ANGIOGRAPHY;  Surgeon: Elmira Newman PARAS, MD;  Location: [facility] INVASIVE CV LAB;  Service: Cardiovascular;  Laterality: Right;   MOHS SURGERY     Dec 2023   MOHS SURGERY  03/2023   scalp   MYRINGOPLASTY W/ FAT GRAFT Right 01/08/2024   Procedure: MYRINGOPLASTY WITH FAT GRAFT;  Surgeon: Carlie Clark, MD;  Location: [facility] OR;  Service: ENT;  Laterality: Right;   PERIPHERAL VASCULAR ATHERECTOMY  08/26/2017   Procedure: PERIPHERAL VASCULAR ATHERECTOMY;  Surgeon: Bergamo Gordy, MD;  Location: [facility] INVASIVE CV LAB;  Service: Cardiovascular;;   PERIPHERAL VASCULAR ATHERECTOMY  09/23/2017   Procedure: PERIPHERAL VASCULAR ATHERECTOMY;  Surgeon: Elmira Newman PARAS, MD;  Location: [facility] INVASIVE CV LAB;  Service: Cardiovascular;;   PERIPHERAL VASCULAR BALLOON ANGIOPLASTY  08/26/2017   Procedure: PERIPHERAL VASCULAR BALLOON ANGIOPLASTY;  Surgeon: Bergamo Gordy, MD;  Location: [facility] INVASIVE CV LAB;  Service: Cardiovascular;;   PERIPHERAL VASCULAR BALLOON ANGIOPLASTY  09/23/2017   Procedure: PERIPHERAL VASCULAR BALLOON ANGIOPLASTY;  Surgeon: Elmira Newman PARAS, MD;  Location: [facility] INVASIVE CV LAB;  Service: Cardiovascular;;   SKIN CANCER EXCISION     nose- basal cell   TEE WITHOUT  CARDIOVERSION N/A 06/17/2016   Procedure: TRANSESOPHAGEAL ECHOCARDIOGRAM (TEE);  Surgeon: Maude Fleeta Ochoa, MD;  Location: [facility] OR;  Service: Open Heart Surgery;  Laterality: N/A;   TONSILLECTOMY     TYMPANOMASTOIDECTOMY Right 05/15/2023   Procedure: RIGHT TYMPANOMASTOIDECTOMY WITH OSSICULAR CHAIN RECONSTRUCTION;  Surgeon: Carlie Clark, MD;  Location: [facility] OR;  Service: ENT;  Laterality: Right;    Family History  Problem Relation Age of Onset   Heart disease Father    Pancreatic cancer Brother    Colon cancer Neg Hx     Esophageal cancer Neg Hx    Rectal cancer Neg Hx    Stomach cancer Neg Hx     Medications Ordered Prior to Encounter[1]  Allergies[2]  Social History   Substance and Sexual Activity  Alcohol Use Not Currently   Alcohol/week: 14.0 standard drinks of alcohol   Types: 14 Cans of beer per week   Comment: Quit end of October 2022    Tobacco Use History[3]  Review of Systems  Constitutional: Negative.   HENT: Negative.    Eyes: Negative.   Respiratory: Negative.    Cardiovascular: Negative.   Gastrointestinal: Negative.   Genitourinary: Negative.   Musculoskeletal: Negative.   Skin: Negative.   Neurological: Negative.   Endo/Heme/Allergies: Negative.   Psychiatric/Behavioral: Negative.      Objective   Vitals:   04/22/24 0949  BP: 138/69  Pulse: 77  Resp: 12  Temp: 97.7 F (36.5 C)  SpO2: 94%    Physical Exam Vitals reviewed.  Constitutional:      Appearance: Normal appearance. He is normal weight. He is not ill-appearing.  HENT:     Head: Normocephalic and atraumatic.  Cardiovascular:     Rate and Rhythm: Normal rate and regular rhythm.     Heart sounds: Normal heart sounds. No murmur heard.    No friction rub. No gallop.  Pulmonary:     Effort: Pulmonary effort is normal. No respiratory distress.     Breath sounds: Normal breath sounds. No stridor. No wheezing, rhonchi or rales.  Abdominal:     General: Bowel sounds are normal. There is no distension.     Palpations: Abdomen is soft. There is no mass.     Tenderness: There is no abdominal tenderness. There is no guarding or rebound.     Hernia: A hernia is present.     Comments: Large reducible right inguinal hernia.  No left inguinal hernia present.  Genitourinary:    Testes: Normal.  Skin:    General: Skin is warm and dry.  Neurological:     Mental Status: He is alert and oriented to person, place, and time.     Assessment  Right inguinal hernia Plan  Patient is becoming more symptomatic  with his right inguinal hernia.  I have offered him a robotic assisted laparoscopic right inguinal herniorrhaphy with mesh.  He will think about it.  The risks and benefits of the procedure including bleeding, infection, mesh use, and the possibility of recurrence of the hernia were fully explained to the patient.    [1]  Current Outpatient Medications on File Prior to Visit  Medication Sig Dispense Refill   allopurinol  (ZYLOPRIM ) 100 MG tablet Take 200 mg by mouth in the morning.  0   amLODipine  (NORVASC ) 10 MG tablet Take 10 mg by mouth daily.     atorvastatin  (LIPITOR) 80 MG tablet TAKE 1 TABLET(80 MG) BY MOUTH DAILY 90 tablet 3   BD PEN NEEDLE NANO 2ND GEN 32G  X 4 MM MISC USE TWICE DAILY WITH LEVEMIR      chlorthalidone  (HYGROTON ) 25 MG tablet TAKE 1 TABLET(25 MG) BY MOUTH DAILY 90 tablet 3   clopidogrel  (PLAVIX ) 75 MG tablet Take 75 mg by mouth in the morning.     cyanocobalamin (VITAMIN B12) 1000 MCG tablet Take 1,000 mcg by mouth in the morning.     docusate sodium  (COLACE) 100 MG capsule Take 100 mg by mouth in the morning.     ezetimibe  (ZETIA ) 10 MG tablet TAKE 1 TABLET(10 MG) BY MOUTH DAILY 30 tablet 6   LANTUS  SOLOSTAR 100 UNIT/ML Solostar Pen Inject 34 Units into the skin at bedtime.     Magnesium  Oxide -Mg Supplement 500 MG TABS Take 500 mg by mouth daily.     metoprolol  tartrate (LOPRESSOR ) 50 MG tablet Take 1 tablet (50 mg total) by mouth 2 (two) times daily. 180 tablet 3   NONFORMULARY OR COMPOUNDED ITEM Apply topically. Cash Powder- apply topically 2 x daily only once weekly     Semaglutide, 1 MG/DOSE, (OZEMPIC, 1 MG/DOSE,) 4 MG/3ML SOPN Inject 1 mg into the skin every Friday.     tadalafil (CIALIS) 5 MG tablet Take 5 mg by mouth every evening.     valsartan  (DIOVAN ) 160 MG tablet Take 1 tablet (160 mg total) by mouth daily. 90 tablet 3   No current facility-administered medications on file prior to visit.  [2] No Known Allergies [3]  Social History Tobacco Use  Smoking  Status Former   Current packs/day: 0.00   Average packs/day: 1.5 packs/day for 48.0 years (72.0 ttl pk-yrs)   Types: Cigarettes   Start date: 03/29/1968   Quit date: 03/29/2016   Years since quitting: 8.0  Smokeless Tobacco Never  Tobacco Comments   Quit smoking 03/29/2016

## 2024-05-26 ENCOUNTER — Ambulatory Visit: Admitting: Podiatry
